# Patient Record
Sex: Female | Born: 1995 | Race: Black or African American | Hispanic: No | Marital: Married | Smoking: Never smoker
Health system: Southern US, Community
[De-identification: ages and names within clinical notes are randomized; demographics above are authoritative.]

## PROBLEM LIST (undated history)

## (undated) ENCOUNTER — Emergency Department (HOSPITAL_COMMUNITY): Admission: EM | Discharge status: Absent without leave | Payer: BLUE CROSS/BLUE SHIELD

## (undated) DIAGNOSIS — Z8489 Family history of other specified conditions: Secondary | ICD-10-CM

## (undated) DIAGNOSIS — R519 Headache, unspecified: Secondary | ICD-10-CM

## (undated) DIAGNOSIS — A491 Streptococcal infection, unspecified site: Secondary | ICD-10-CM

## (undated) DIAGNOSIS — L309 Dermatitis, unspecified: Secondary | ICD-10-CM

## (undated) DIAGNOSIS — I340 Nonrheumatic mitral (valve) insufficiency: Secondary | ICD-10-CM

## (undated) DIAGNOSIS — J309 Allergic rhinitis, unspecified: Secondary | ICD-10-CM

## (undated) DIAGNOSIS — Z9109 Other allergy status, other than to drugs and biological substances: Secondary | ICD-10-CM

## (undated) DIAGNOSIS — Z227 Latent tuberculosis: Secondary | ICD-10-CM

## (undated) DIAGNOSIS — R7611 Nonspecific reaction to tuberculin skin test without active tuberculosis: Secondary | ICD-10-CM

## (undated) DIAGNOSIS — T8859XA Other complications of anesthesia, initial encounter: Secondary | ICD-10-CM

## (undated) DIAGNOSIS — J111 Influenza due to unidentified influenza virus with other respiratory manifestations: Secondary | ICD-10-CM

## (undated) DIAGNOSIS — J45909 Unspecified asthma, uncomplicated: Secondary | ICD-10-CM

## (undated) DIAGNOSIS — K219 Gastro-esophageal reflux disease without esophagitis: Secondary | ICD-10-CM

## (undated) DIAGNOSIS — R51 Headache: Secondary | ICD-10-CM

## (undated) DIAGNOSIS — K760 Fatty (change of) liver, not elsewhere classified: Secondary | ICD-10-CM

## (undated) DIAGNOSIS — T7840XA Allergy, unspecified, initial encounter: Secondary | ICD-10-CM

## (undated) DIAGNOSIS — L739 Follicular disorder, unspecified: Secondary | ICD-10-CM

## (undated) DIAGNOSIS — E010 Iodine-deficiency related diffuse (endemic) goiter: Secondary | ICD-10-CM

## (undated) HISTORY — DX: Allergic rhinitis, unspecified: J30.9

## (undated) HISTORY — PX: WISDOM TOOTH EXTRACTION: SHX21

## (undated) HISTORY — DX: Streptococcal infection, unspecified site: A49.1

## (undated) HISTORY — DX: Dermatitis, unspecified: L30.9

## (undated) HISTORY — DX: Gastro-esophageal reflux disease without esophagitis: K21.9

## (undated) HISTORY — DX: Headache: R51

## (undated) HISTORY — DX: Unspecified asthma, uncomplicated: J45.909

## (undated) HISTORY — DX: Allergy, unspecified, initial encounter: T78.40XA

## (undated) HISTORY — DX: Influenza due to unidentified influenza virus with other respiratory manifestations: J11.1

## (undated) HISTORY — DX: Headache, unspecified: R51.9

## (undated) HISTORY — DX: Other allergy status, other than to drugs and biological substances: Z91.09

---
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
## 2015-06-08 ENCOUNTER — Emergency Department (HOSPITAL_COMMUNITY): Payer: Medicaid Other

## 2015-06-08 ENCOUNTER — Encounter (HOSPITAL_COMMUNITY): Payer: Self-pay | Admitting: *Deleted

## 2015-06-08 ENCOUNTER — Emergency Department (HOSPITAL_COMMUNITY)
Admission: EM | Admit: 2015-06-08 | Discharge: 2015-06-08 | Disposition: A | Discharge status: Home / Self Care | Payer: Medicaid Other | Attending: Emergency Medicine | Admitting: Emergency Medicine

## 2015-06-08 DIAGNOSIS — R109 Unspecified abdominal pain: Secondary | ICD-10-CM

## 2015-06-08 DIAGNOSIS — Z79899 Other long term (current) drug therapy: Secondary | ICD-10-CM | POA: Diagnosis not present

## 2015-06-08 DIAGNOSIS — R51 Headache: Secondary | ICD-10-CM | POA: Insufficient documentation

## 2015-06-08 DIAGNOSIS — R509 Fever, unspecified: Secondary | ICD-10-CM | POA: Diagnosis not present

## 2015-06-08 DIAGNOSIS — M546 Pain in thoracic spine: Secondary | ICD-10-CM | POA: Diagnosis not present

## 2015-06-08 DIAGNOSIS — J45901 Unspecified asthma with (acute) exacerbation: Secondary | ICD-10-CM | POA: Insufficient documentation

## 2015-06-08 DIAGNOSIS — Z8619 Personal history of other infectious and parasitic diseases: Secondary | ICD-10-CM | POA: Insufficient documentation

## 2015-06-08 DIAGNOSIS — Z872 Personal history of diseases of the skin and subcutaneous tissue: Secondary | ICD-10-CM | POA: Diagnosis not present

## 2015-06-08 DIAGNOSIS — Z3202 Encounter for pregnancy test, result negative: Secondary | ICD-10-CM | POA: Diagnosis not present

## 2015-06-08 LAB — COMPREHENSIVE METABOLIC PANEL
ALT: 16 U/L (ref 14–54)
AST: 26 U/L (ref 15–41)
Albumin: 4.9 g/dL (ref 3.5–5.0)
Alkaline Phosphatase: 48 U/L (ref 38–126)
Anion gap: 10 (ref 5–15)
BUN: 9 mg/dL (ref 6–20)
CO2: 27 mmol/L (ref 22–32)
Calcium: 10.2 mg/dL (ref 8.9–10.3)
Chloride: 108 mmol/L (ref 101–111)
Creatinine, Ser: 0.84 mg/dL (ref 0.44–1.00)
GFR calc Af Amer: >60 mL/min (ref >60)
GFR calc non Af Amer: >60 mL/min (ref >60)
Glucose, Bld: 92 mg/dL (ref 65–99)
Potassium: 4.3 mmol/L (ref 3.5–5.1)
Sodium: 145 mmol/L (ref 135–145)
Total Bilirubin: 0.9 mg/dL (ref 0.3–1.2)
Total Protein: 7.9 g/dL (ref 6.5–8.1)

## 2015-06-08 LAB — CBC WITH DIFFERENTIAL/PLATELET
Basophils Absolute: 0 10*3/uL (ref 0.0–0.1)
Basophils Relative: 0 %
Eosinophils Absolute: 0.1 10*3/uL (ref 0.0–0.7)
Eosinophils Relative: 1 %
HCT: 39.2 % (ref 36.0–46.0)
Hemoglobin: 12.7 g/dL (ref 12.0–15.0)
Lymphocytes Relative: 41 %
Lymphs Abs: 2.7 10*3/uL (ref 0.7–4.0)
MCH: 28.6 pg (ref 26.0–34.0)
MCHC: 32.4 g/dL (ref 30.0–36.0)
MCV: 88.3 fL (ref 78.0–100.0)
Monocytes Absolute: 0.3 10*3/uL (ref 0.1–1.0)
Monocytes Relative: 5 %
Neutro Abs: 3.5 10*3/uL (ref 1.7–7.7)
Neutrophils Relative %: 53 %
Platelets: 348 10*3/uL (ref 150–400)
RBC: 4.44 MIL/uL (ref 3.87–5.11)
RDW: 12.9 % (ref 11.5–15.5)
WBC: 6.6 10*3/uL (ref 4.0–10.5)

## 2015-06-08 LAB — URINALYSIS, ROUTINE W REFLEX MICROSCOPIC
Bilirubin Urine: NEGATIVE
Glucose, UA: NEGATIVE mg/dL
Hgb urine dipstick: NEGATIVE
Ketones, ur: NEGATIVE mg/dL
Leukocytes, UA: NEGATIVE
Nitrite: NEGATIVE
Protein, ur: NEGATIVE mg/dL
Specific Gravity, Urine: 1.015 (ref 1.005–1.030)
pH: 6.5 (ref 5.0–8.0)

## 2015-06-08 LAB — LIPASE, BLOOD: Lipase: 37 U/L (ref 11–51)

## 2015-06-08 LAB — POC URINE PREG, ED: Preg Test, Ur: NEGATIVE

## 2015-06-08 MED ORDER — SODIUM CHLORIDE 0.9 % IV BOLUS (SEPSIS)
1000.0000 mL | Freq: Once | INTRAVENOUS | Status: AC
  Administered 2015-06-08: 1000 mL via INTRAVENOUS

## 2015-06-08 MED ORDER — ACETAMINOPHEN 325 MG PO TABS
650.0000 mg | ORAL_TABLET | Freq: Once | ORAL | Status: AC
  Administered 2015-06-08: 650 mg via ORAL
  Filled 2015-06-08: qty 2

## 2015-06-08 MED ORDER — IBUPROFEN 800 MG PO TABS: 800.0000 mg | ORAL_TABLET | Freq: Three times a day (TID) | ORAL | Status: DC

## 2015-06-08 MED ORDER — KETOROLAC TROMETHAMINE 30 MG/ML IJ SOLN
30.0000 mg | Freq: Once | INTRAMUSCULAR | Status: AC
  Administered 2015-06-08: 30 mg via INTRAVENOUS
  Filled 2015-06-08: qty 1

## 2015-06-08 NOTE — ED Provider Notes (Signed)
CSN: [id number]     Arrival date & time 06/08/15  1627 History   First MD Initiated Contact with Patient 06/08/15 1836     Chief Complaint  Patient presents with  . Flank Pain     (Consider location/radiation/quality/duration/timing/severity/associated sxs/prior Treatment) Patient is a 19 y.o. female presenting with flank pain. The history is provided by the patient.  Flank Pain This is a new problem. The current episode started in the past 7 days (3 days ago). The problem occurs constantly. The problem has been unchanged. Associated symptoms include a fever and headaches. Pertinent negatives include no abdominal pain, chest pain, coughing, nausea, urinary symptoms or vomiting. The symptoms are aggravated by bending and twisting. She has tried NSAIDs, ice and heat for the symptoms. The treatment provided no relief.   Maria Smith is a 19 y.o. female with PMH significant for asthma, headaches who presents with gradual onset, constant, moderate right flank pain that radiates to her right abdomen that began 3 days ago that is unrelieved with OTC medications, heat, and ice.  She states it is made worse with movement, walking, and deep breathing.  No modifying factors.  She denies any urinary symptoms, N/V/D, CP, or abdominal pain.  She endorses SOB due to pain with inspiration.  She reports fever yesterday (102.3) that has resolved.  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   Past Medical History  Diagnosis Date  . Streptococcal infection(041.00)     Age 68 and 19 years old  . Influenza     Age 65 years  . Environmental allergies     Age 66 years  . Asthma   . Allergic rhinitis   . Eczema   . Generalized headaches     Began at age 30 years   Past Surgical History  Procedure Laterality Date  . Tonsillectomy  2012  . Wisdom tooth extraction     Family History  Problem Relation Age of Onset  . Migraines Paternal Aunt     . Migraines Paternal Grandmother   . Migraines Paternal Grandfather   . Migraines Maternal Uncle     Maternal Great Uncle  . Diabetes      Family History  . Hypertension      Family History  . Depression      Family History  . Asthma      Family History  . Allergic rhinitis      Family History   Social History  Substance Use Topics  . Smoking status: Passive Smoke Exposure - Never Smoker  . Smokeless tobacco: Never Used  . Alcohol Use: No   OB History    No data available     Review of Systems  Constitutional: Positive for fever.  Respiratory: Positive for shortness of breath. Negative for cough.   Cardiovascular: Negative for chest pain and leg swelling.  Gastrointestinal: Negative for nausea, vomiting, abdominal pain, constipation and blood in stool.  Genitourinary: Positive for flank pain. Negative for dysuria, urgency, frequency and hematuria.  Neurological: Positive for headaches.  All other systems reviewed and are negative.     Allergies  Review of patient's allergies indicates no known allergies.  Home Medications   Prior to Admission medications   Medication Sig Start Date End Date Taking? Authorizing Provider  ibuprofen (ADVIL,MOTRIN) 200 MG tablet Take 400 mg by mouth every 6 (six) hours as needed for moderate pain.   Yes Historical Provider, MD  albuterol (PROVENTIL HFA;VENTOLIN HFA) 108 (90 BASE) MCG/ACT inhaler Inhale 2 puffs into the lungs every 6 (six) hours as needed for wheezing.    Historical Provider, MD  ibuprofen (ADVIL,MOTRIN) 800 MG tablet Take 1 tablet (800 mg total) by mouth every 8 (eight) hours as needed for cramping. Patient not taking: Reported on 06/08/2015 10/07/14   Lawson Fiscal A Clemmons, CNM  traMADol (ULTRAM) 50 MG tablet Take 1 tablet (50 mg total) by mouth every 6 (six) hours as needed for moderate pain. Patient not taking: Reported on 06/08/2015 12/05/14   Fayrene Helper, PA-C   BP 111/73 mmHg  Pulse 67  Temp(Src) 98 F (36.7 C) (Oral)   Resp 17  Ht  (1.473 m)  Wt 51.256 kg  BMI 23.62 kg/m2  SpO2 96%  LMP 06/05/2015 Physical Exam  Constitutional: She is oriented to person, place, and time. She appears well-developed and well-nourished.  HENT:  Head: Normocephalic and atraumatic.  Mouth/Throat: Oropharynx is clear and moist.  Eyes: Conjunctivae are normal. Pupils are equal, round, and reactive to light.  Neck: Normal range of motion. Neck supple.  Cardiovascular: Normal rate, regular rhythm and normal heart sounds.   No murmur heard. Pulmonary/Chest: Effort normal and breath sounds normal. No accessory muscle usage or stridor. No respiratory distress. She has no wheezes. She has no rhonchi. She has no rales.  Abdominal: Soft. Bowel sounds are normal. She exhibits no distension. There is no tenderness. There is no rigidity, no rebound, no guarding and no CVA tenderness.  Musculoskeletal: Normal range of motion. She exhibits tenderness.       Thoracic back: She exhibits pain.       Back:  No cervical/thoracic/lumbar midline tenderness.  Lymphadenopathy:    She has no cervical adenopathy.  Neurological: She is alert and oriented to person, place, and time.  Speech clear without dysarthria.  Skin: Skin is warm and dry.  Psychiatric: She has a normal mood and affect. Her behavior is normal.    ED Course  Procedures (including critical care time) Labs Review Labs Reviewed  URINALYSIS, ROUTINE W REFLEX MICROSCOPIC (NOT AT [facility]) - Abnormal; Notable for the following:    APPearance CLOUDY (*)    All other components within normal limits  CBC WITH DIFFERENTIAL/PLATELET  COMPREHENSIVE METABOLIC PANEL  LIPASE, BLOOD  POC URINE PREG, ED    Imaging Review Dg Chest 2 View  06/08/2015  CLINICAL DATA:  Headache, flank and chest pain today. EXAM: CHEST  2 VIEW COMPARISON:  12/04/2014 FINDINGS: The heart size and mediastinal contours are within normal limits. Both lungs are clear. The visualized skeletal structures  are unremarkable. Stable left convex thoracic scoliosis. IMPRESSION: Normal chest x-ray. Electronically Signed   By: Rudie Meyer M.D.   On: 06/08/2015 19:39   Ct Renal Stone Study  06/08/2015  CLINICAL DATA:  Right flank pain and headache starting Monday EXAM: CT ABDOMEN AND PELVIS WITHOUT CONTRAST TECHNIQUE: Multidetector CT imaging of the abdomen and pelvis was performed following the standard protocol without IV contrast. COMPARISON:  01/27/2013 FINDINGS: Lung bases are unremarkable. Study is limited without IV and oral contrast. Unenhanced liver shows no biliary ductal dilatation. Sagittal images of the spine are unremarkable. Unenhanced pancreas, spleen and adrenal glands are unremarkable. No aortic aneurysm. Unenhanced kidneys are symmetrical in size. No nephrolithiasis. No hydronephrosis or hydroureter. No small bowel obstruction. No ascites or free air. No adenopathy. There is a low lying cecum. Moderate stool noted in right colon and cecum. No pericecal inflammation. The  appendix is not identified. The uterus is anteflexed. The unenhanced ovaries are poorly visualized. No adnexal mass. There is some stool in descending colon. Moderate stool and gas noted within rectum. No calcified calculi are noted within urinary bladder. No calcified ureteral calculi are noted bilaterally. No destructive bony lesions are noted within pelvis. There is no inguinal adenopathy. IMPRESSION: 1. No nephrolithiasis.  No hydronephrosis or hydroureter. 2. No calcified ureteral calculi. 3. There is a low lying cecum. Moderate stool noted in right colon and cecum. No pericecal inflammation. Appendix is not identified. 4. Normal size anteflexed uterus.  No adnexal mass. 5. Moderate stool and gas noted within rectum. No distal colonic obstruction. No small bowel obstruction. Electronically Signed   By: Natasha MeadLiviu  Pop M.D.   On: 06/08/2015 19:32   I have personally reviewed and evaluated these images and lab results as part of my  medical decision-making.   EKG Interpretation None      MDM   Final diagnoses:  Right flank pain    Patient presents with right flank pain that radiates to her right abdomen x 3 days.  One episode of resolved fever (102.3).  No N/V or urinary symptoms.  VSS, NAD.  On exam, heart RRR, lungs CTAB, abdomen soft and benign.  Right CVA tenderness.  No midline tenderness.  Concern for pyelonephritis vs nephrolithiasis vs PNA vs musculoskeletal.  Will obtain labs and CT renal stone study.  Will give fluids and tylenol.  CBC unremarkable CMP unremarkable.  Will give toradol. UA unremarkable CT Renal shows no nephrolithiasis.  Moderate stool in right colon.  No acute abnormalities.  CXR negative.  Suspect musculoskeletal etiology.  Patient's symptoms have improved.  Will d/c home with Motrin.  Evaluation does not show pathology requring ongoing emergent intervention or admission. Pt is hemodynamically stable and mentating appropriately. Discussed findings/results and plan with patient/guardian, who agrees with plan. All questions answered. Return precautions discussed and outpatient follow up given. Case has been discussed with and seen by Dr. Patria Maneampos who agrees with the above plan for discharge.   Cheri FowlerKayla Cecile Gillispie, PA-C 06/08/15 2044  Azalia BilisKevin Campos, MD 06/08/15 [phone]

## 2015-06-08 NOTE — ED Notes (Signed)
Pt complains of pain in her right flank and headache since Monday. Pt denies n/v/d, urinary frequency, dysuria. Pt states she took ibuprofen for pain, which did not help.

## 2015-06-08 NOTE — Discharge Instructions (Signed)
Flank Pain °Flank pain refers to pain that is located on the side of the body between the upper abdomen and the back. The pain may occur over a short period of time (acute) or may be long-term or reoccurring (chronic). It may be mild or severe. Flank pain can be caused by many things. °CAUSES  °Some of the more common causes of flank pain include: °· Muscle strains.   °· Muscle spasms.   °· A disease of your spine (vertebral disk disease).   °· A lung infection (pneumonia).   °· Fluid around your lungs (pulmonary edema).   °· A kidney infection.   °· Kidney stones.   °· A very painful skin rash caused by the chickenpox virus (shingles).   °· Gallbladder disease.   °HOME CARE INSTRUCTIONS  °Home care will depend on the cause of your pain. In general, °· Rest as directed by your caregiver. °· Drink enough fluids to keep your urine clear or pale yellow. °· Only take over-the-counter or prescription medicines as directed by your caregiver. Some medicines may help relieve the pain. °· Tell your caregiver about any changes in your pain. °· Follow up with your caregiver as directed. °SEEK IMMEDIATE MEDICAL CARE IF:  °· Your pain is not controlled with medicine.   °· You have new or worsening symptoms. °· Your pain increases.   °· You have abdominal pain.   °· You have shortness of breath.   °· You have persistent nausea or vomiting.   °· You have swelling in your abdomen.   °· You feel faint or pass out.   °· You have blood in your urine. °· You have a fever or persistent symptoms for more than 2-3 days. °· You have a fever and your symptoms suddenly get worse. °MAKE SURE YOU:  °· Understand these instructions. °· Will watch your condition. °· Will get help right away if you are not doing well or get worse. °  °This information is not intended to replace advice given to you by your health care provider. Make sure you discuss any questions you have with your health care provider. °  °Document Released: 07/26/2005 Document  Revised: 02/27/2012 Document Reviewed: 01/17/2012 °Elsevier Interactive Patient Education ©2016 Elsevier Inc. ° ° ° ° °Emergency Department Resource Guide °1) Find a Doctor and Pay Out of Pocket °Although you won't have to find out who is covered by your insurance plan, it is a good idea to ask around and get recommendations. You will then need to call the office and see if the doctor you have chosen will accept you as a new patient and what types of options they offer for patients who are self-pay. Some doctors offer discounts or will set up payment plans for their patients who do not have insurance, but you will need to ask so you aren't surprised when you get to your appointment. ° °2) Contact Your Local Health Department °Not all health departments have doctors that can see patients for sick visits, but many do, so it is worth a call to see if yours does. If you don't know where your local health department is, you can check in your phone book. The CDC also has a tool to help you locate your state's health department, and many state websites also have listings of all of their local health departments. ° °3) Find a Walk-in Clinic °If your illness is not likely to be very severe or complicated, you may want to try a walk in clinic. These are popping up all over the country in pharmacies, drugstores, and shopping   centers. They're usually staffed by nurse practitioners or physician assistants that have been trained to treat common illnesses and complaints. They're usually fairly quick and inexpensive. However, if you have serious medical issues or chronic medical problems, these are probably not your best option. ° °No Primary Care Doctor: °- Call [facility] at  832-8000 - they can help you locate a primary care doctor that  accepts your insurance, provides certain services, etc. °- Physician Referral Service- 1-800-533-3463 ° °Chronic Pain Problems: °Organization         Address  Phone   Notes  °[facility]  (336) 297-2271 Patients need to be referred by their primary care doctor.  ° °Medication Assistance: °Organization         Address  Phone   Notes  °[place] [facility] [address] °[place], [place] [address] (336) 641-8030 --Must be a resident of [place] °-- Must have NO insurance coverage whatsoever (no Medicaid/ Medicare, etc.) °-- The pt. MUST have a primary care doctor that directs their care regularly and follows them in the community °  °[facility]  (866) 331-1348   °[employer]  (888) 892-1162   ° °Agencies that provide inexpensive medical care: °Organization         Address  Phone   Notes  °[facility]  (336) 832-8035   °[facility]    (336) 832-7272   °[facility] [address] °[place], [place] [address] (336) 832-4777   °[facility] [address], °[place] (336) 271-4999   °[facility]    (336) 373-0678   °[facility]    (336) 272-1050   °[facility] ° [address], [place] Phone:  (336) 832-4444, Fax:  (336) 832-4440 Hours of Operation:  9 am - 6 pm, M-F.  Also accepts Medicaid/Medicare and self-pay.  °[facility] ° [address], [place] Phone: (336) 832-3150, Fax: (336) 832-3151. Hours of Operation:  8:30 am - 5:30 pm, M-F.  Also accepts Medicaid and self-pay.  °[facility] [place] [address], [place] Phone: (336) 878-6027   °[facility] [address], [place], [place] (336)723-1848, Ext. 123 Mondays & Thursdays: 7-9 AM.  First 15 patients are seen on a first come, first serve basis. °  ° °Medicaid-accepting [place] Providers: ° °Organization         Address  Phone   Notes  °[facility] [address], [place] (336) 641-2100 Also accepts self-pay patients.  °[facility] [address], [place] ° (336) 856-9996   °[facility] [address], [place] (336) 288-8857   °[facility] [address] [place] [address], [place] (336) 299-7000   °Veita Bland [address], [place]  ° (336) 373-1557 Only accepts [place] Access Medicaid patients after they have their name applied to their card.  ° °Self-Pay (no insurance) in [place]: ° °Organization         Address  Phone   Notes  °Sickle Cell Patients, [facility] [address], [place] (336) 832-1970   °[facility] [address],  (336) 832-4400   °[facility] ° [address], [place] (336) 992-4800   °  [facility]/Dr. Osei-Bonsu ° [address] [place] [address], [place] or [address], [place] (336) 841-8500 Phone number for both [place] and [place] locations is the same.  °[facility] [address], [place] (336) 299-0000   °[facility] [address] [place] [address], [place] or [address] (336) 852-7530 °(336) 878-2260   °[facility] [address], [place] (336) 350-1642, phone; (336) 294-5005, fax Sees patients 1st and 3rd Saturday of every month.  Must not qualify for public or private insurance (i.e. Medicaid, Medicare, [place] Health Choice, Veterans' Benefits) • Household income should be no more than 200% of the poverty level •The clinic cannot treat you if you are pregnant or think you are pregnant • Sexually transmitted diseases are not treated at the clinic.  ° ° °Dental Care: °Organization         Address  Phone  Notes  °[facility] [facility] [address], [place] (336) 641-6152 Accepts children up to age 21 who are enrolled in Medicaid or [place] Health Choice; pregnant women with a Medicaid card; and children who have applied for Medicaid  or [place] Health Choice, but were declined, whose parents can pay a reduced fee at time of service.  °[facility] [place]  [address], [place] (336) 641-7733 Accepts children up to age 21 who are enrolled in Medicaid or [place] Health Choice; pregnant women with a Medicaid card; and children who have applied for Medicaid or [place] Health Choice, but were declined, whose parents can pay a reduced fee at time of service.  °[facility] ° [address], [place] (336) 641-4533 Patients are seen by appointment only. Walk-ins are not accepted. [facility] will see patients 18 years of age and older. °Monday - Tuesday (8am-5pm) °Most Wednesdays (8:30-5pm) °$30 per visit, cash only  °[facility] ° [address], [place] (336) 641-4533 Patients are seen by appointment only. Walk-ins are not accepted. [facility] will see patients 18 years of age and older. °One Wednesday Evening (Monthly: Volunteer Based).  $30 per visit, cash only  °[facility]  (919) 537-3737 for adults; Children under age 4, call Graduate Pediatric Dentistry at (919) [phone]. Children aged 4-14, please call (919) [phone] to request a pediatric application. ° Dental services are provided in all areas of dental care including fillings, crowns and bridges, complete and partial dentures, implants, gum treatment, root canals, and extractions. Preventive care is also provided. Treatment is provided to both adults and children. °Patients are selected via a lottery and there is often a waiting list. °  °[facility] [address], ° ° (336) 763-8833 [email or web address] °  °[facility] [address], [place], [place] (336)723-1848, Ext. 123 Second and Fourth Thursday of each month, opens at 6:30 AM; Clinic ends at 9 AM.  Patients are seen on a first-come first-served basis, and a limited number are seen  during each clinic.  ° °[facility] ° [address], [place], [place] (336) 723-7904   Eligibility Requirements °You must have lived in [place], [place], or [place] counties for at least the last three months. °  You cannot be eligible for state or federal sponsored healthcare insurance, including Veterans Administration, Medicaid, or Medicare. °  You generally cannot be eligible for healthcare insurance   through your employer.  °  How to apply: °Eligibility screenings are held every Tuesday and Wednesday afternoon from 1:00 pm until 4:00 pm. You do not need an appointment for the interview!  °[facility] [address], [place], [place] 336-631-2330   °[facility]  336-342-8273   °[facility]  336-703-3100   °[facility]  336-570-6415   ° °Behavioral Health Resources in the Community: °Intensive Outpatient Programs °Organization         Address  Phone  Notes  °[facility] [address], [place], [place] 336-878-6098   °[facility] [address], [place], [place] 336-832-9800   °[facility] [address], [place], [place] ° 336-882-2125   °[facility] [address],  °[place], [place] 1-800-853-5163 or 336-641-4981   °Substance Abuse Resources °Organization         Address  Phone  Notes  °[facility]  336-882-2125   °[facility]  336-784-9470   °[facility]  336-285-9073   °[facility]  336-845-3988   °Residential & Outpatient Substance Abuse Program  1-800-659-3381   °Psychological Services °Organization         Address  Phone  Notes  °[facility]  336- 832-9600   °[facility]  336- 378-7881   °[facility] [address], [place] 1-800-853-5163 or 336-641-4981   ° °Mobile Crisis Teams °Organization         Address  Phone  Notes  °[facility],  Mobile Crisis Care Unit  1-877-626-1772   °Assertive °[facility] ° [address] [place], [place] 336-834-9664   °Sharon DeEsch [address] °[place] 336-554-5454   ° °Self-Help/Support Groups °Organization         Address  Phone             Notes  °[facility] - variety of support groups  336- 373-1402 Call for more information  °Narcotics Anonymous (NA), [facility] [address], °[place]  2 meetings at this location  ° °Residential Treatment Programs °Organization         Address  Phone  Notes  °[facility] [address],    °[place]  1-866-801-8205   °[facility] ° [address], [place], [place] 704-293-8524   °[facility] [address], [place] 336-845-3988 Admissions: 8am-3pm M-F  °[facility] [address]    °[place], [place] 336-841-1104   °[facility] [address], [place], [place] 336-379-7146   °[facility] [address]  °[place], [place] 336-285-9073   °[facility] - Intensive Outpatient [address], , [place] 336-852-3033   °[facility] [address]  °[place], [place] 1-877-615-2722 or 336-784-9470   °[facility] ([facility] [address] [place], [place] 336-227-7417 Accepts Medicaid  °[facility] [address]  ° [place] 1-800-659-3381 Substance Abuse/Addiction Treatment  ° °[place] Behavioral Health Resources °Organization         Address  Phone  Notes  °[facility]  (888) 581-9988   °Julie Brannon, PhD [address] [place],    (336) 349-5553 or (336) 951-0000   °[facility]   [address] °[place],  (336) 349-4454   °  [facility] [address], [place], [place] (336) 342-8316 Insurance/Medicaid/sponsorship through [facility]  °[facility] [address]                                    [place], [place] (336) 342-8316 Therapy/tele-psych/case  °[facility] [address]  ° [place], [place] (336) 349-2233    °Dr. Arfeen  (336) 349-4544   °[facility] [place]  [employer] [place] [facility] 1) [address], [place] °2) [address], [place] °3)  [address], [place] (336) 349-3220 °(336) 342-7768 ° °(336) 342-8140   °[place] [facility] (336) 342-1394 or (336) 342-3537 (After Hours)    ° ° ° °

## 2015-07-20 DIAGNOSIS — R7611 Nonspecific reaction to tuberculin skin test without active tuberculosis: Secondary | ICD-10-CM | POA: Diagnosis present

## 2015-07-20 HISTORY — DX: Nonspecific reaction to tuberculin skin test without active tuberculosis: R76.11

## 2015-08-10 ENCOUNTER — Other Ambulatory Visit: Payer: Self-pay | Admitting: Infectious Disease

## 2015-08-10 ENCOUNTER — Ambulatory Visit
Admission: RE | Admit: 2015-08-10 | Discharge: 2015-08-10 | Disposition: A | Discharge status: Home / Self Care | Payer: No Typology Code available for payment source | Source: Ambulatory Visit | Attending: Infectious Disease | Admitting: Infectious Disease

## 2015-08-10 DIAGNOSIS — Z139 Encounter for screening, unspecified: Secondary | ICD-10-CM

## 2015-10-22 ENCOUNTER — Encounter (HOSPITAL_COMMUNITY): Payer: Self-pay | Admitting: *Deleted

## 2015-10-22 ENCOUNTER — Inpatient Hospital Stay (HOSPITAL_COMMUNITY)
Admission: AD | Admit: 2015-10-22 | Discharge: 2015-10-22 | Disposition: A | Discharge status: Home / Self Care | Payer: Medicaid Other | Source: Ambulatory Visit | Attending: Obstetrics & Gynecology | Admitting: Obstetrics & Gynecology

## 2015-10-22 DIAGNOSIS — X58XXXA Exposure to other specified factors, initial encounter: Secondary | ICD-10-CM

## 2015-10-22 DIAGNOSIS — J45909 Unspecified asthma, uncomplicated: Secondary | ICD-10-CM | POA: Diagnosis not present

## 2015-10-22 DIAGNOSIS — S3991XA Unspecified injury of abdomen, initial encounter: Secondary | ICD-10-CM | POA: Diagnosis not present

## 2015-10-22 DIAGNOSIS — M7918 Myalgia, other site: Secondary | ICD-10-CM

## 2015-10-22 DIAGNOSIS — S3981XA Other specified injuries of abdomen, initial encounter: Secondary | ICD-10-CM | POA: Diagnosis not present

## 2015-10-22 DIAGNOSIS — M791 Myalgia: Secondary | ICD-10-CM | POA: Insufficient documentation

## 2015-10-22 DIAGNOSIS — Y9389 Activity, other specified: Secondary | ICD-10-CM | POA: Diagnosis not present

## 2015-10-22 DIAGNOSIS — Y990 Civilian activity done for income or pay: Secondary | ICD-10-CM | POA: Insufficient documentation

## 2015-10-22 DIAGNOSIS — R7611 Nonspecific reaction to tuberculin skin test without active tuberculosis: Secondary | ICD-10-CM | POA: Diagnosis present

## 2015-10-22 DIAGNOSIS — W2209XA Striking against other stationary object, initial encounter: Secondary | ICD-10-CM | POA: Diagnosis not present

## 2015-10-22 HISTORY — DX: Nonspecific reaction to tuberculin skin test without active tuberculosis: R76.11

## 2015-10-22 LAB — CBC
HCT: 35.8 % — ABNORMAL LOW (ref 36.0–46.0)
Hemoglobin: 11.6 g/dL — ABNORMAL LOW (ref 12.0–15.0)
MCH: 26.2 pg (ref 26.0–34.0)
MCHC: 32.4 g/dL (ref 30.0–36.0)
MCV: 80.8 fL (ref 78.0–100.0)
Platelets: 396 10*3/uL (ref 150–400)
RBC: 4.43 MIL/uL (ref 3.87–5.11)
RDW: 15.5 % (ref 11.5–15.5)
WBC: 5.2 10*3/uL (ref 4.0–10.5)

## 2015-10-22 LAB — WET PREP, GENITAL
Clue Cells Wet Prep HPF POC: NONE SEEN
Sperm: NONE SEEN
Trich, Wet Prep: NONE SEEN
Yeast Wet Prep HPF POC: NONE SEEN

## 2015-10-22 LAB — URINALYSIS, ROUTINE W REFLEX MICROSCOPIC
Bilirubin Urine: NEGATIVE
Glucose, UA: 100 mg/dL — AB
Hgb urine dipstick: NEGATIVE
Ketones, ur: 15 mg/dL — AB
Leukocytes, UA: NEGATIVE
Nitrite: NEGATIVE
Protein, ur: NEGATIVE mg/dL
Specific Gravity, Urine: 1.015 (ref 1.005–1.030)
pH: 5.5 (ref 5.0–8.0)

## 2015-10-22 LAB — POCT PREGNANCY, URINE: Preg Test, Ur: NEGATIVE

## 2015-10-22 MED ORDER — KETOROLAC TROMETHAMINE 10 MG PO TABS: 10.0000 mg | ORAL_TABLET | Freq: Four times a day (QID) | ORAL | Status: DC | PRN

## 2015-10-22 MED ORDER — KETOROLAC TROMETHAMINE 10 MG PO TABS
10.0000 mg | ORAL_TABLET | Freq: Once | ORAL | Status: AC
  Administered 2015-10-22: 10 mg via ORAL
  Filled 2015-10-22: qty 1

## 2015-10-22 NOTE — MAU Note (Signed)
Pt states she is having pain in her left side and pain around her vagina.  Pt states the pain started about a week ago.  Pt states she hit her side at work about a week ago and this is when the pain started.

## 2015-10-22 NOTE — MAU Provider Note (Signed)
Chief Complaint: Abdominal Pain  First Provider Initiated Contact with Patient 10/22/15 1510      SUBJECTIVE HPI: Maria Smith is a 20 y.o. G0 who presents to [facility] reporting left groin pain radiating down to her left labia 1 week since running into a metal bar at work. 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   Location: Left groin Quality:  Stabbing Severity: 9/10 on pain scale Duration: 1 week Context: after trauma Timing: constant Radiation: Down left labia Modifying factors: Minor improvement w/ Ibuprofen 400 mg and warm compresses. Associated signs and symptoms: Neg for fever, chills, N/V/D/C, urinary complaints, vaginal bleeding, vaginal discharge, difficulties w/ ambulation.   Past Medical History  Diagnosis Date  . Streptococcal infection(041.00)     Age 45 and 20 years old  . Influenza     Age 59 years  . Environmental allergies     Age 86 years  . Asthma   . Allergic rhinitis   . Eczema   . Generalized headaches     Began at age 48 years  . 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 Tonsillectomy  2012  . Wisdom tooth extraction     Social History   Social History  . Marital Status: Single    Spouse Name: N/A  . Number of Children: N/A  . Years of Education: N/A   Occupational History  . Not on file.   Social History Main Topics  . Smoking status: Passive Smoke Exposure - Never Smoker  . Smokeless tobacco: Never Used  . Alcohol Use: No  . Drug Use: No  . Sexual Activity: No   Other Topics Concern  . Not on file   Social History Narrative   No current facility-administered medications on file prior to encounter.   Current Outpatient Prescriptions on File Prior to Encounter  Medication Sig Dispense Refill  . ibuprofen (ADVIL,MOTRIN) 800 MG  tablet Take 1 tablet (800 mg total) by mouth 3 (three) times daily. (Patient not taking: Reported on 10/22/2015) 21 tablet 0  . traMADol (ULTRAM) 50 MG tablet Take 1 tablet (50 mg total) by mouth every 6 (six) hours as needed for moderate pain. (Patient not taking: Reported on 06/08/2015) 15 tablet 0   No Known Allergies  I have reviewed the past Medical Hx, Surgical Hx, Social Hx, Allergies and Medications.   Review of Systems  Constitutional: Negative for fever and chills.  Gastrointestinal: Positive for abdominal pain. Negative for nausea, vomiting, diarrhea and constipation.  Genitourinary: Negative for dysuria, urgency, frequency, hematuria, flank pain, vaginal bleeding, vaginal discharge, difficulty urinating, menstrual problem and pelvic pain.       Groin/vulvar pain  Musculoskeletal: Negative for back pain and gait problem.    OBJECTIVE Patient Vitals for the past 24 hrs:  BP Temp Temp src Pulse Resp  10/22/15 1445 111/62 mmHg 98.8 F (37.1 C) Oral 78 16   Constitutional: Well-developed, well-nourished female in no acute distress.  Cardiovascular: normal rate Respiratory: normal rate and effort.  GI: Abd soft, non-tender, gravid appropriate for gestational age. Pos BS x 4 MS: Mild TTP over left groin/hip, no edema, bruising. Normal ROM. Neurologic: Alert and oriented x 4.  GU: Neg CVAT.  SPECULUM EXAM: NEFG, physiologic discharge, no blood noted, cervix clean  BIMANUAL:  cervix closed; uterus normal size, no adnexal tenderness or masses. No CMT.  LAB RESULTS Results for orders placed or performed during the hospital encounter of 10/22/15 (from the past 24 hour(s))  Urinalysis, Routine w reflex microscopic (not at [facility])     Status: Abnormal   Collection Time: 10/22/15  2:35 PM  Result Value Ref Range   Color, Urine AMBER (A) YELLOW   APPearance CLEAR CLEAR   Specific Gravity, Urine 1.015 1.005 - 1.030   pH 5.5 5.0 - 8.0   Glucose, UA 100 (A) NEGATIVE mg/dL   Hgb urine  dipstick NEGATIVE NEGATIVE   Bilirubin Urine NEGATIVE NEGATIVE   Ketones, ur 15 (A) NEGATIVE mg/dL   Protein, ur NEGATIVE NEGATIVE mg/dL   Nitrite NEGATIVE NEGATIVE   Leukocytes, UA NEGATIVE NEGATIVE  Pregnancy, urine POC     Status: None   Collection Time: 10/22/15  3:02 PM  Result Value Ref Range   Preg Test, Ur NEGATIVE NEGATIVE  CBC     Status: Abnormal   Collection Time: 10/22/15  3:20 PM  Result Value Ref Range   WBC 5.2 4.0 - 10.5 K/uL   RBC 4.43 3.87 - 5.11 MIL/uL   Hemoglobin 11.6 (L) 12.0 - 15.0 g/dL   HCT 16.1 (L) 09.6 - 04.5 %   MCV 80.8 78.0 - 100.0 fL   MCH 26.2 26.0 - 34.0 pg   MCHC 32.4 30.0 - 36.0 g/dL   RDW 40.9 81.1 - 91.4 %   Platelets 396 150 - 400 K/uL  Wet prep, genital     Status: Abnormal   Collection Time: 10/22/15  3:56 PM  Result Value Ref Range   Yeast Wet Prep HPF POC NONE SEEN NONE SEEN   Trich, Wet Prep NONE SEEN NONE SEEN   Clue Cells Wet Prep HPF POC NONE SEEN NONE SEEN   WBC, Wet Prep HPF POC FEW (A) NONE SEEN   Sperm NONE SEEN     IMAGING No results found.  MAU COURSE UPT, UA, Wet prep, GC/Chlamydia, CBC.  Discussed w/ pt that this does not appear to be Gyn pain. Offered OP pelvic US if pain feels pelvic, but pt thinks it is more superficial and at the site of her injury. Declines [place]. Toradol given.  MDM - 20 year old female with diffuse skeletal left groin pain. No evidence of emergent condition. No evidence of gyn  etiology for pain.  ASSESSMENT 1. Injury of groin, initial encounter   2. Musculoskeletal pain     PLAN Discharge home in stable condition. RICE  May try heat and stretches this far after the injury Rx Toradol x 1 week  Follow-up Information    Follow up with [facility],R[facility].   Specialty:  Unknown Physician Specialty   Why:  As needed if symptoms worsen      Follow up with [facility].   Specialty:  Obstetrics and Gynecology   Why:  For non-emergent gynecology care or as needed for pelvic ultrasound    Contact information:   [address] [place] [address] [phone]        Medication List    STOP taking these medications        ibuprofen 800 MG tablet  Commonly known as:  ADVIL,MOTRIN     traMADol 50 MG tablet  Commonly known as:  ULTRAM      TAKE these medications        ketorolac 10 MG tablet  Commonly known as:  TORADOL  Take  1 tablet (10 mg total) by mouth every 6 (six) hours as needed for moderate pain.     rifampin 150 MG capsule  Commonly known as:  RIFADIN  Take 150 mg by mouth daily.       [place]ia Erichsen, [place] 10/22/2015  5:28 PM

## 2015-10-22 NOTE — Discharge Instructions (Signed)

## 2015-10-24 LAB — GC/CHLAMYDIA PROBE AMP (~~LOC~~) NOT AT ARMC
Chlamydia: NEGATIVE
Neisseria Gonorrhea: NEGATIVE

## 2016-04-30 ENCOUNTER — Ambulatory Visit: Payer: Medicaid Other | Admitting: Family

## 2016-05-03 ENCOUNTER — Telehealth: Payer: Self-pay | Admitting: *Deleted

## 2016-05-03 NOTE — Telephone Encounter (Signed)
Unable to reach patient at time of Pre-Visit Call.  Left message for patient to return call when available.    

## 2016-05-04 ENCOUNTER — Ambulatory Visit (INDEPENDENT_AMBULATORY_CARE_PROVIDER_SITE_OTHER): Payer: BLUE CROSS/BLUE SHIELD | Admitting: Family Medicine

## 2016-05-04 ENCOUNTER — Encounter: Payer: Self-pay | Admitting: Family Medicine

## 2016-05-04 VITALS — BP 104/62 | HR 68 | Temp 97.9°F | Ht <= 58 in | Wt 104.4 lb

## 2016-05-04 DIAGNOSIS — J4520 Mild intermittent asthma, uncomplicated: Secondary | ICD-10-CM

## 2016-05-04 DIAGNOSIS — G43809 Other migraine, not intractable, without status migrainosus: Secondary | ICD-10-CM

## 2016-05-04 DIAGNOSIS — Z23 Encounter for immunization: Secondary | ICD-10-CM | POA: Diagnosis not present

## 2016-05-04 MED ORDER — KETOROLAC TROMETHAMINE 30 MG/ML IJ SOLN
30.0000 mg | Freq: Once | INTRAMUSCULAR | Status: AC
  Administered 2016-05-04: 30 mg via INTRAMUSCULAR

## 2016-05-04 MED ORDER — ALBUTEROL SULFATE HFA 108 (90 BASE) MCG/ACT IN AERS: 2.0000 | INHALATION_SPRAY | Freq: Four times a day (QID) | RESPIRATORY_TRACT | 3 refills | Status: DC | PRN

## 2016-05-04 MED ORDER — SUMATRIPTAN SUCCINATE 50 MG PO TABS: 50.0000 mg | ORAL_TABLET | ORAL | 0 refills | Status: DC | PRN

## 2016-05-04 NOTE — Patient Instructions (Addendum)
Try to avoid triggers like mold. Keep track of how often you need to use your inhaler.  If you are having more headaches, make an appointment to see about a daily medication.

## 2016-05-04 NOTE — Progress Notes (Signed)
Pre visit review using our clinic review tool, if applicable. No additional management support is needed unless otherwise documented below in the visit note. 

## 2016-05-04 NOTE — Progress Notes (Signed)
Chief Complaint  Patient presents with  . Establish Care    Pt states having severe headaches consistantly in left side with throbbing and shooting and is not going away        New Patient Visit SUBJECTIVE: HPI: Maria Smith is an 20 y.o.female who is being seen for establishing care.  The patient was previously seen at a pediatrician's office several years ago.  2 week headache. +hx of migraines.One is left-sided, she is sensitive to light, sound, and has some nausea. Denies any vision changes, weakness, numbness, or tingling. She's tried going into a dark room, avoiding sounds, ibuprofen, Tylenol, and warm compresses without relief. She's not had a headache in over a year prior to this. She used to be on Elavil and Topamax, however due to side effects had to stop taking this. She was prescribed a triptan medication, however never really took it.  The patient has a history of asthma. Is normally well controlled with just an albuterol inhaler. Normally, she only uses it before exercise and rarely when she has shortness of breath. Her triggers include dust, cold, and mold. Visually mold was found in her apartment. She notices that she has been having more chest tightness and shortness of breath. She has been out of her albuterol inhaler as well.  No Known Allergies  Past Medical History:  Diagnosis Date  . Allergic rhinitis   . Asthma   . Eczema   . Environmental allergies    Age 41 years  . Generalized headaches    Began at age 20 years  . Influenza    Age 69 years  . 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 Streptococcal infection(041.00)    Age 20 and 20 years old   Past Surgical History:  Procedure Laterality Date  . TONSILLECTOMY  2012  . WISDOM TOOTH EXTRACTION     Social History   Social History  . Marital status: Single   Social History Main Topics  . Smoking status: Passive Smoke Exposure - Never Smoker  . Smokeless tobacco: Never Used  .  Alcohol use No  . Drug use: No  . Sexual activity: No   Family History  Problem Relation Age of Onset  . Migraines Paternal Aunt   . Migraines Paternal Grandmother   . Migraines Paternal Grandfather   . Migraines Maternal Uncle     Maternal Great Uncle  . Diabetes      Family History  . Hypertension      Family History  . Depression      Family History  . Asthma      Family History  . Allergic rhinitis      Family History     Current Outpatient Prescriptions:  .  albuterol (PROVENTIL HFA;VENTOLIN HFA) 108 (90 Base) MCG/ACT inhaler, Inhale 2 puffs into the lungs every 6 (six) hours as needed for wheezing or shortness of breath., Disp: 2 Inhaler, Rfl: 3 .  ketorolac (TORADOL) 10 MG tablet, Take 1 tablet (10 mg total) by mouth every 6 (six) hours as needed for moderate pain. (Patient not taking: Reported on 05/04/2016), Disp: 20 tablet, Rfl: 0 .  rifampin (RIFADIN) 150 MG capsule, Take 150 mg by mouth daily., Disp: , Rfl:  .  SUMAtriptan (IMITREX) 50 MG tablet, Take 1 tablet (50 mg total) by mouth every 2 (two) hours as needed for migraine. May repeat in 2 hours if headache persists or recurs., Disp: 10 tablet, Rfl: 0  Patient's  last menstrual period was 03/25/2016.  ROS Neuro: Denies numbness/tingling, +HA  Eyes: Denies vision changes   OBJECTIVE: BP 104/62 (BP Location: Left Arm, Patient Position: Sitting, Cuff Size: Small)   Pulse 68   Temp 97.9 F (36.6 C) (Oral)   Ht 4\' 9"  (1.448 m)   Wt 104 lb 6.4 oz (47.4 kg)   LMP 03/25/2016   SpO2 99%   BMI 22.59 kg/m   Constitutional: -  VS reviewed -  Well developed, well nourished, appears stated age -  No apparent distress  Psychiatric: -  Oriented to person, place, and time -  Memory intact -  Affect and mood normal -  Fluent conversation, good eye contact -  Judgment and insight age appropriate  Eye: -  Conjunctivae clear, no discharge -  Pupils symmetric, round, reactive to light  ENMT: -  Ears are patent b/l  without erythema or discharge. TM's are shiny and clear b/l without evidence of effusion or infection. -  Oral mucosa without lesions, tongue and uvula midline    Tonsils not enlarged, no erythema, no exudate, trachea midline    Pharynx moist, no lesions, no erythema  Neck: -  No gross swelling, no palpable masses -  Thyroid midline, not enlarged, mobile, no palpable masses  Cardiovascular: -  RRR, no murmurs -  No LE edema  Respiratory: -  Normal respiratory effort, no accessory muscle use, no retraction -  Breath sounds equal, no wheezes, no ronchi, no crackles  Gastrointestinal: -  Bowel sounds normal -  No tenderness, no distention, no guarding, no masses  Neurological:  -  CN II - XII grossly intact -  Patellar DTR 3/4 b/l, calcaneal DTR 2/4 b/l, biceps reflex 2/4 b/l, no clonus, no cerebellar signs -  Sensation grossly intact to light touch, equal bilaterally  Musculoskeletal: -  No clubbing, no cyanosis -  Gait normal  Skin: -  No significant lesion on inspection -  Warm and dry to palpation   ASSESSMENT/PLAN: Other migraine without status migrainosus, not intractable - Plan: SUMAtriptan (IMITREX) 50 MG tablet, ketorolac (TORADOL) 30 MG/ML injection 30 mg  Mild intermittent asthma without complication - Plan: albuterol (PROVENTIL HFA;VENTOLIN HFA) 108 (90 Base) MCG/ACT inhaler  Encounter for immunization - Plan: Flu Vaccine QUAD 36+ mos IM  Patient instructed to sign release of records form from her previous PCP. Toradol today. Imitrex in case it occurs again. If he starts having recurrent headaches, would like to return and we can discuss a daily prophylactic medication. Would likely start Mg. For asthma, will refill her albuterol inhaler. I will recheck her in around 1 month and see if she needs any maintenance inhalers. Both she and her roommate are disgruntled with their apartment managements handling of mold in their home. The requested a letter from me. I wrote one stating  that I believe it is best for her health to avoid environmental triggers such as mold. Letter for work also given. Patient should return in 1 mo to recheck asthma. Will need to schedule well visit when she turns 21. Can do pap smears with us or with GYN if she is more comfortable. The patient voiced understanding and agreement to the plan.   Jilda Rocheicholas Paul McIntyreWendling, DO 05/04/16  8:27 AM

## 2016-05-30 ENCOUNTER — Ambulatory Visit: Payer: Medicaid Other | Admitting: Family

## 2016-06-13 ENCOUNTER — Ambulatory Visit: Payer: BLUE CROSS/BLUE SHIELD | Admitting: Family Medicine

## 2016-06-20 ENCOUNTER — Encounter: Payer: Self-pay | Admitting: Family Medicine

## 2016-06-20 ENCOUNTER — Ambulatory Visit (INDEPENDENT_AMBULATORY_CARE_PROVIDER_SITE_OTHER): Payer: BLUE CROSS/BLUE SHIELD | Admitting: Family Medicine

## 2016-06-20 VITALS — BP 110/70 | HR 58 | Temp 98.1°F | Ht <= 58 in | Wt 108.6 lb

## 2016-06-20 DIAGNOSIS — Z0000 Encounter for general adult medical examination without abnormal findings: Secondary | ICD-10-CM

## 2016-06-20 DIAGNOSIS — R002 Palpitations: Secondary | ICD-10-CM | POA: Diagnosis not present

## 2016-06-20 LAB — BASIC METABOLIC PANEL
BUN: 8 mg/dL (ref 6–23)
CO2: 27 mEq/L (ref 19–32)
Calcium: 9.6 mg/dL (ref 8.4–10.5)
Chloride: 105 mEq/L (ref 96–112)
Creatinine, Ser: 0.77 mg/dL (ref 0.40–1.20)
GFR: 121.84 mL/min (ref >60.00)
Glucose, Bld: 88 mg/dL (ref 70–99)
Potassium: 3.6 mEq/L (ref 3.5–5.1)
Sodium: 138 mEq/L (ref 135–145)

## 2016-06-20 LAB — TSH: TSH: 1.03 u[IU]/mL (ref 0.35–5.50)

## 2016-06-20 LAB — POCT URINE PREGNANCY: Preg Test, Ur: NEGATIVE

## 2016-06-20 LAB — MAGNESIUM: Magnesium: 2.1 mg/dL (ref 1.5–2.5)

## 2016-06-20 NOTE — Patient Instructions (Addendum)
Establish with a GYN for your pap smear after you turn 21.  Someone will notify you by the end of the week about your lab results.  Use your Eucerin twice daily. Once before bed and once after getting out of shower (within 3 min of drying off).

## 2016-06-20 NOTE — Progress Notes (Signed)
Chief Complaint  Patient presents with  . Annual Exam    Well Woman Maria Smith is here for a complete physical.   Her last physical was >1 year ago.  Current diet: in general, a "unhealthy" diet  . Current exercise: no routine exercise. Weight is stable and she denies daytime fatigue. Patient's last menstrual period was 05/05/2016. She is sexually active with a female partner. Normally regular with her cycles.  Follow with a Dentist? Yes.   Follow with an Optometrist? Yes.   Seatbelt? Yes  Palpitations Up to 2-3 times per day, will have fluttering in her chest. Usually at night, when she wakes up, or when she lies down. Lasts around 1 minNot exertional. No pain, SOB, arm/jaw pain. She does have asthma and takes albuterol, but uses it on average of 2x/day. No significant personal hx or famhx of heart disease.  Past Medical History:  Diagnosis Date  . Allergic rhinitis   . Asthma   . Eczema   . Environmental allergies    Age 21 years  . Generalized headaches    Began at age 89 years  . Influenza    Age 58 years  . 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 Streptococcal infection(041.00)    Age 21 and 21 years old    Past Surgical History:  Procedure Laterality Date  . TONSILLECTOMY  2012  . WISDOM TOOTH EXTRACTION     Medications  Current Outpatient Prescriptions on File Prior to Visit  Medication Sig Dispense Refill  . albuterol (PROVENTIL HFA;VENTOLIN HFA) 108 (90 Base) MCG/ACT inhaler Inhale 2 puffs into the lungs every 6 (six) hours as needed for wheezing or shortness of breath. 2 Inhaler 3  . ketorolac (TORADOL) 10 MG tablet Take 1 tablet (10 mg total) by mouth every 6 (six) hours as needed for moderate pain. (Patient not taking: Reported on 06/20/2016) 20 tablet 0  . rifampin (RIFADIN) 150 MG capsule Take 150 mg by mouth daily.    . SUMAtriptan (IMITREX) 50 MG tablet Take 1 tablet (50 mg total) by mouth every 2 (two) hours as needed for migraine. May  repeat in 2 hours if headache persists or recurs. (Patient not taking: Reported on 06/20/2016) 10 tablet 0   Allergies No Known Allergies  Review of Systems: Constitutional:  no unexpected change in weight, no weakness, no unexplained fevers, sweats, or chills Eye:  no recent significant change in vision Ear/Nose/Mouth/Throat:  Ears:  no tinnitus or vertigo and no recent change in hearing, Nose/Mouth/Throat:  no complaints of nasal congestion or discharge, no sore throat and no recent change in voice or hoarseness Cardiovascular:  no exercise intolerance, no chest pain, +palpitations Respiratory:  no chronic cough, sputum, or hemoptysis and no shortness of breath Gastrointestinal:  no abdominal pain, no change in bowel habits, no significant change in appetite, no nausea, vomiting, diarrhea, or constipation and no black or bloody stool GU:  Female: negative for dysuria, frequency, and incontinence, Normal menses; no abnormal bleeding, pelvic pain, or discharge Musculoskeletal/Extremities:  no pain, redness, or swelling of the joints Integumentary (Skin/Breast):  +dry skin on back and sides, no new breast lumps or masses Neurologic:  no chronic headaches, no numbness, tingling, or tremor Psychiatric:  no anxiety, no depression Endocrine:  denies fatigue, weight changes, heat/cold intolerance, bowel or skin changes, or cardiovascular system symptoms Hematologic/Lymphatic:  no abnormal bleeding, no HIV risk factors, no night sweats, no swollen nodes, no weight loss Allergic/Immunologic:  no  history of food or environmental allergies  Exam BP 110/70 (BP Location: Left Arm, Patient Position: Sitting, Cuff Size: Small)   Pulse (!) 58   Temp 98.1 F (36.7 C) (Oral)   Ht 4\' 9"  (1.448 m)   Wt 108 lb 9.6 oz (49.3 kg)   LMP 05/05/2016   SpO2 95%   BMI 23.50 kg/m  General:  well developed, well nourished, in no apparent distress Skin:  +dry and slightly hyperpigmented areas on back with minimal  scaling, no erythema or drainage ;no significant moles, warts, or growths Head:  no masses, lesions, or tenderness Eyes:  pupils equal and round, sclera anicteric without injection Ears:  canals without lesions, TMs shiny without retraction, no obvious effusion, no erythema Nose:  nares patent, septum midline, mucosa normal, and no drainage or sinus tenderness Throat/Pharynx:  lips and gingiva without lesion; tongue and uvula midline; non-inflamed pharynx; no exudates or postnasal drainage Neck: neck supple without adenopathy, thyromegaly, or masses Breasts: Not examined Thorax:  nontender Lungs:  clear to auscultation, breath sounds equal bilaterally, no respiratory distress Cardio:  regular rate and rhythm without murmurs, heart sounds without clicks or rubs, point of maximal impulse normal; no lifts, heaves, or thrills Abdomen:  abdomen soft, nontender; bowel sounds normal; no masses or organomegaly Genital: Female: Not examined Musculoskeletal:  symmetrical muscle groups noted without atrophy or deformity Extremities:  no clubbing, cyanosis, or edema, no deformities, no skin discoloration Neuro:  gait normal; deep tendon reflexes normal and symmetric Psych: well oriented with normal range of affect and appropriate judgment/insight  Assessment and Plan  Well adult exam  Palpitations - Plan: EKG 12-Lead, TSH, Basic Metabolic Panel (BMET), Magnesium, Holter monitor - 72 hour   Well 21 y.o. female. Counseled on diet and exercise. EKG unremarkable. V5 was malfunctioning. Likely PVCs or PACs but will rule out other etiologies. Start using lotion twice daily, once before bed and once after getting out of shower. Will make sure she is not pregnant until she establishes with GYN. She will get pap smears there also. Follow up in 2 weeks pending the above workup. The patient voiced understanding and agreement to the plan.  Jilda Roche [place], DO 06/20/16 11:33 AM

## 2016-06-20 NOTE — Addendum Note (Signed)
Addended by: Eustace QuailEABOLD, Lyndall Bellot J on: 06/20/2016 11:47 AM   Modules accepted: Orders

## 2016-06-20 NOTE — Progress Notes (Signed)
Pre visit review using our clinic review tool, if applicable. No additional management support is needed unless otherwise documented below in the visit note. 

## 2016-06-21 ENCOUNTER — Telehealth: Payer: Self-pay | Admitting: Family Medicine

## 2016-06-21 NOTE — Telephone Encounter (Signed)
Patient returned your call. Please advise.  

## 2016-06-21 NOTE — Telephone Encounter (Signed)
LVMOM for pt to return my call into the office. TL/CMA 

## 2016-06-21 NOTE — Telephone Encounter (Signed)
Pt called in to speak back with nurse in regards to results. She would like to know specifically what labs were drawn?   Please advise.

## 2016-06-21 NOTE — Telephone Encounter (Signed)
Have her come back and we can discuss her options. If she would like to wait until she can be seen by her GYN, that is OK also. TY.

## 2016-06-21 NOTE — Telephone Encounter (Signed)
I have spoken to patient and informed of each lab that was ordered and gave results to help understand. Pt had questions about menstrual because she states she has not had one since November. Pt seems concerned and wants to know her next steps. TL/CMA

## 2016-06-21 NOTE — Addendum Note (Signed)
Addended by: Radene GunningWENDLING, Mirha Brucato P on: 06/21/2016 04:51 PM   Modules accepted: Orders

## 2016-06-22 NOTE — Telephone Encounter (Signed)
I have spoken to patient and she has informed me her menstraul started yesterday (06/22/2016) FYI only TL/CMA

## 2016-06-25 ENCOUNTER — Encounter: Payer: BLUE CROSS/BLUE SHIELD | Admitting: Obstetrics and Gynecology

## 2016-06-27 ENCOUNTER — Ambulatory Visit (INDEPENDENT_AMBULATORY_CARE_PROVIDER_SITE_OTHER): Payer: BLUE CROSS/BLUE SHIELD

## 2016-06-27 DIAGNOSIS — R002 Palpitations: Secondary | ICD-10-CM | POA: Diagnosis not present

## 2016-07-04 ENCOUNTER — Ambulatory Visit: Payer: BLUE CROSS/BLUE SHIELD | Admitting: Family Medicine

## 2016-07-18 ENCOUNTER — Telehealth: Payer: Self-pay

## 2016-07-18 NOTE — Telephone Encounter (Signed)
Pt called into the office requesting results of heart monitor. TL/CMA

## 2016-07-19 NOTE — Addendum Note (Signed)
Addended by: Radene GunningWENDLING, NICHOLAS P on: 07/19/2016 12:41 PM   Modules accepted: Orders

## 2016-07-19 NOTE — Telephone Encounter (Signed)
It showed some intermittent PAC's and PVC's which are not significant, but likely the cause of her symptoms. Given her lack of heart history, no further testing warranted. Try to stay hydrated and let us know if things worsen.

## 2016-07-24 NOTE — Telephone Encounter (Signed)
Patient calling back checking on the status of message below, please advise  ° ° ° ° ° ° °

## 2016-07-25 NOTE — Telephone Encounter (Signed)
Pt notified of instructions and verbalized understanding. States she is having continued palpitations that are unchanged from previous office visit. She wonders if they might be related to anxiety, although states sometimes she is not anxious at all when symptoms occur. She states she does not drink much water-encouraged increased fluid intake. She stated she will call back to schedule a follow-up appt w/ PCP to discuss possible anxiety/other causes of her symptoms, as she does find the palpitations bothersome. Discussed red flags that should prompt emergent assessment and patient agreed to plan.

## 2017-01-28 ENCOUNTER — Ambulatory Visit (INDEPENDENT_AMBULATORY_CARE_PROVIDER_SITE_OTHER): Payer: BLUE CROSS/BLUE SHIELD | Admitting: Family Medicine

## 2017-01-28 ENCOUNTER — Encounter: Payer: Self-pay | Admitting: Family Medicine

## 2017-01-28 VITALS — BP 90/60 | HR 65 | Temp 98.3°F | Ht <= 58 in | Wt 104.2 lb

## 2017-01-28 DIAGNOSIS — K219 Gastro-esophageal reflux disease without esophagitis: Secondary | ICD-10-CM

## 2017-01-28 DIAGNOSIS — L0292 Furuncle, unspecified: Secondary | ICD-10-CM

## 2017-01-28 DIAGNOSIS — J4520 Mild intermittent asthma, uncomplicated: Secondary | ICD-10-CM | POA: Diagnosis not present

## 2017-01-28 MED ORDER — FLUTICASONE PROPIONATE HFA 110 MCG/ACT IN AERO: 2.0000 | INHALATION_SPRAY | Freq: Two times a day (BID) | RESPIRATORY_TRACT | 2 refills | Status: DC

## 2017-01-28 NOTE — Patient Instructions (Addendum)
Call [facility] at [phone] for an appointment.  They are located at [address], [address], [place], [place], [phone] (right across the hall from our office).   Use triple antibiotic ointment on areas that are painful on your chest.  The only lifestyle changes that have data behind them are weight loss for the overweight/obese and elevating the head of the bed. Finding out which foods/positions are triggers is important. Prilosec (omeprazole) or Pepcid (famotidine) may be helpful as well.  Rinse mouth out after use of the new inhaler.

## 2017-01-28 NOTE — Progress Notes (Signed)
Chief Complaint  Patient presents with  . Rash    on the chest-x 2 mos,also feeling nauseated in the morings  . Chest gets tight    comes and goes    Maria Smith is a 21 y.o. female here for a skin complaint.  Duration: 2 months Location: chest Pruritic? No Painful? No Drainage? No New soaps/lotions/topicals/detergents? No Sick contacts? No Other associated symptoms: None Therapies tried thus far: Witch Hazel  Asthma Hx of mild intermittent asthma, things have gotten worse over the past 2 weeks (Ragweed?). She has been using her rescue inhaler more frequently. She is not on any other medications. She has been coughing more frequently, some SOB. No wheezing, weight changes, fevers.  She also has some nausea and discomfort in mornings following alcohol consumption. She notices it with certain foods as well. No bleeding, pain or fevers.  ROS:  Const: No fevers Skin: As noted in HPI  Past Medical History:  Diagnosis Date  . Allergic rhinitis   . Asthma   . Eczema   . Environmental allergies    Age 24 years  . Generalized headaches    Began at age 21 years  . Influenza    Age 71 years  . 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 Streptococcal infection(041.00)    Age 21 and 21 years old   No Known Allergies Allergies as of 01/28/2017   No Known Allergies     Medication List       Accurate as of 01/28/17  9:55 AM. Always use your most recent med list.          albuterol 108 (90 Base) MCG/ACT inhaler Commonly known as:  PROVENTIL HFA;VENTOLIN HFA Inhale 2 puffs into the lungs every 6 (six) hours as needed for wheezing or shortness of breath.   fluticasone 110 MCG/ACT inhaler Commonly known as:  FLOVENT HFA Inhale 2 puffs into the lungs 2 (two) times daily.   OVER THE COUNTER MEDICATION Mulitivitamin gummie-Take 1 gummie by mouth daily.       BP 90/60 (BP Location: Left Arm, Patient Position: Sitting, Cuff Size: Normal)   Pulse 65    Temp 98.3 F (36.8 C) (Oral)   Ht 4' 9.75" (1.467 m)   Wt 104 lb 3.2 oz (47.3 kg)   LMP 01/04/2017 (Approximate)   SpO2 99%   BMI 21.97 kg/m  Gen: awake, alert, appearing stated age HEENT: Ears nml b/l, nares patent, no D/C, MMM Heart: RRR, no LE edema Lungs: CTAB, No accessory muscle use Skin: a few pustules over upper chest. No drainage, erythema, TTP, fluctuance, excoriation Psych: Age appropriate judgment and insight, nml mood and affect  Mild intermittent asthma without complication - Plan: fluticasone (FLOVENT HFA) 110 MCG/ACT inhaler  Furuncle  Gastroesophageal reflux disease, esophagitis presence not specified  Orders as above. Add ICS during ragweed season. Rinse mouth out after use. TAO on active lesions, OK to use witch hazel if she feels it is helpful. F/u sooner for drainage or fevers/seek care.  Lifestyle modifications discussed regarding reflux. Recommended PPI/H2 blocker if she does not have success with avoidance of triggers/elevating HOB. F/u in 6 weeks. The patient voiced understanding and agreement to the plan.  Jilda Rocheicholas Paul [place], DO 01/28/17 9:55 AM

## 2017-02-27 ENCOUNTER — Encounter: Payer: BLUE CROSS/BLUE SHIELD | Admitting: Obstetrics & Gynecology

## 2017-06-26 ENCOUNTER — Other Ambulatory Visit (HOSPITAL_COMMUNITY)
Admission: RE | Admit: 2017-06-26 | Discharge: 2017-06-26 | Disposition: A | Discharge status: Home / Self Care | Payer: BLUE CROSS/BLUE SHIELD | Source: Ambulatory Visit | Attending: Obstetrics and Gynecology | Admitting: Obstetrics and Gynecology

## 2017-06-26 ENCOUNTER — Other Ambulatory Visit: Payer: Self-pay

## 2017-06-26 ENCOUNTER — Ambulatory Visit (INDEPENDENT_AMBULATORY_CARE_PROVIDER_SITE_OTHER): Payer: BLUE CROSS/BLUE SHIELD | Admitting: Obstetrics and Gynecology

## 2017-06-26 ENCOUNTER — Encounter: Payer: Self-pay | Admitting: Obstetrics and Gynecology

## 2017-06-26 VITALS — BP 102/60 | HR 80 | Resp 14 | Ht <= 58 in | Wt 111.0 lb

## 2017-06-26 DIAGNOSIS — Z3169 Encounter for other general counseling and advice on procreation: Secondary | ICD-10-CM | POA: Diagnosis not present

## 2017-06-26 DIAGNOSIS — Z124 Encounter for screening for malignant neoplasm of cervix: Secondary | ICD-10-CM | POA: Insufficient documentation

## 2017-06-26 DIAGNOSIS — Z01419 Encounter for gynecological examination (general) (routine) without abnormal findings: Secondary | ICD-10-CM

## 2017-06-26 DIAGNOSIS — Z0000 Encounter for general adult medical examination without abnormal findings: Secondary | ICD-10-CM | POA: Diagnosis not present

## 2017-06-26 DIAGNOSIS — Z113 Encounter for screening for infections with a predominantly sexual mode of transmission: Secondary | ICD-10-CM | POA: Insufficient documentation

## 2017-06-26 NOTE — Patient Instructions (Signed)
EXERCISE AND DIET:  We recommended that you start or continue a regular exercise program for good health. Regular exercise means any activity that makes your heart beat faster and makes you sweat.  We recommend exercising at least 30 minutes per day at least 3 days a week, preferably 4 or 5.  We also recommend a diet low in fat and sugar.  Inactivity, poor dietary choices and obesity can cause diabetes, heart attack, stroke, and kidney damage, among others.    ALCOHOL AND SMOKING:  Women should limit their alcohol intake to no more than 7 drinks/beers/glasses of wine (combined, not each!) per week. Moderation of alcohol intake to this level decreases your risk of breast cancer and liver damage. And of course, no recreational drugs are part of a healthy lifestyle.  And absolutely no smoking or even second hand smoke. Most people know smoking can cause heart and lung diseases, but did you know it also contributes to weakening of your bones? Aging of your skin?  Yellowing of your teeth and nails?  CALCIUM AND VITAMIN D:  Adequate intake of calcium and Vitamin D are recommended.  The recommendations for exact amounts of these supplements seem to change often, but generally speaking 600 mg of calcium (either carbonate or citrate) and 800 units of Vitamin D per day seems prudent. Certain women may benefit from higher intake of Vitamin D.  If you are among these women, your doctor will have told you during your visit.    PAP SMEARS:  Pap smears, to check for cervical cancer or precancers,  have traditionally been done yearly, although recent scientific advances have shown that most women can have pap smears less often.  However, every woman still should have a physical exam from her gynecologist every year. It will include a breast check, inspection of the vulva and vagina to check for abnormal growths or skin changes, a visual exam of the cervix, and then an exam to evaluate the size and shape of the uterus and  ovaries.  And after 22 years of age, a rectal exam is indicated to check for rectal cancers. We will also provide age appropriate advice regarding health maintenance, like when you should have certain vaccines, screening for sexually transmitted diseases, bone density testing, colonoscopy, mammograms, etc.    Preparing for Pregnancy If you are considering becoming pregnant, make an appointment to see your regular health care provider to learn how to prepare for a safe and healthy pregnancy (preconception care). During a preconception care visit, your health care provider will:  Do a complete physical exam, including a Pap test.  Take a complete medical history.  Give you information, answer your questions, and help you resolve problems.  Preconception checklist Medical history  Tell your health care provider about any current or past medical conditions. Your pregnancy or your ability to become pregnant may be affected by chronic conditions, such as diabetes, chronic hypertension, and thyroid problems.  Include your family's medical history as well as your partner's medical history.  Tell your health care provider about any history of STIs (sexually transmitted infections).These can affect your pregnancy. In some cases, they can be passed to your baby. Discuss any concerns that you have about STIs.  If indicated, discuss the benefits of genetic testing. This testing will show whether there are any genetic conditions that may be passed from you or your partner to your baby.  Tell your health care provider about: ? Any problems you have had with conception  or pregnancy. ? Any medicines you take. These include vitamins, herbal supplements, and over-the-counter medicines. ? Your history of immunizations. Discuss any vaccinations that you may need.  Diet  Ask your health care provider what to include in a healthy diet that has a balance of nutrients. This is especially important when you are  pregnant or preparing to become pregnant.  Ask your health care provider to help you reach a healthy weight before pregnancy. ? If you are overweight, you may be at higher risk for certain complications, such as high blood pressure, diabetes, and preterm birth. ? If you are underweight, you are more likely to have a baby who has a low birth weight.  Lifestyle, work, and home  Let your health care provider know: ? About any lifestyle habits that you have, such as alcohol use, drug use, or smoking. ? About recreational activities that may put you at risk during pregnancy, such as downhill skiing and certain exercise programs. ? Tell your health care provider about any international travel, especially any travel to places with an active [place] virus outbreak. ? About harmful substances that you may be exposed to at work or at home. These include chemicals, pesticides, radiation, or even litter boxes. ? If you do not feel safe at home.  Mental health  Tell your health care provider about: ? Any history of mental health conditions, including feelings of depression, sadness, or anxiety. ? Any medicines that you take for a mental health condition. These include herbs and supplements.  Home instructions to prepare for pregnancy Lifestyle  Eat a balanced diet. This includes fresh fruits and vegetables, whole grains, lean meats, low-fat dairy products, healthy fats, and foods that are high in fiber. Ask to meet with a nutritionist or registered dietitian for assistance with meal planning and goals.  Get regular exercise. Try to be active for at least 30 minutes a day on most days of the week. Ask your health care provider which activities are safe during pregnancy.  Do not use any products that contain nicotine or tobacco, such as cigarettes and e-cigarettes. If you need help quitting, ask your health care provider.  Do not drink alcohol.  Do not take illegal drugs.  Maintain a healthy weight.  Ask your health care provider what weight range is right for you.  General instructions  Keep an accurate record of your menstrual periods. This makes it easier for your health care provider to determine your baby's due date.  Begin taking prenatal vitamins and folic acid supplements daily as directed by your health care provider.  Manage any chronic conditions, such as high blood pressure and diabetes, as told by your health care provider. This is important.  How do I know that I am pregnant? You may be pregnant if you have been sexually active and you miss your period. Symptoms of early pregnancy include:  Mild cramping.  Very light vaginal bleeding (spotting).  Feeling unusually tired.  Nausea and vomiting (morning sickness).  If you have any of these symptoms and you suspect that you might be pregnant, you can take a home pregnancy test. These tests check for a hormone in your urine (human chorionic gonadotropin, or hCG). A woman's body begins to make this hormone during early pregnancy. These tests are very accurate. Wait until at least the first day after you miss your period to take one. If the test shows that you are pregnant (you get a positive result), call your health care provider to  make an appointment for prenatal care. What should I do if I become pregnant?  Make an appointment with your health care provider as soon as you suspect you are pregnant.  Do not use any products that contain nicotine, such as cigarettes, chewing tobacco, and e-cigarettes. If you need help quitting, ask your health care provider.  Do not drink alcoholic beverages. Alcohol is related to a number of birth defects.  Avoid toxic odors and chemicals.  You may continue to have sexual intercourse if it does not cause pain or other problems, such as vaginal bleeding. This information is not intended to replace advice given to you by your health care provider. Make sure you discuss any questions you  have with your health care provider. Document Released: 05/17/2008 Document Revised: 01/31/2016 Document Reviewed: 12/25/2015 Elsevier Interactive Patient Education  2018 [employer]   

## 2017-06-26 NOTE — Progress Notes (Signed)
22 y.o. G0P0000 SingleAfrican AmericanF here for annual exam.  Sexually active, engaged to her female partner. Wants to get pregnant, requests referral to Infertility clinic.  Period Cycle (Days): 28 Period Duration (Days): 4-5 days  Period Pattern: Regular Menstrual Flow: Heavy Menstrual Control: Maxi pad Menstrual Control Change Freq (Hours): changes pad 5 times a day  Dysmenorrhea: (!) Mild Dysmenorrhea Symptoms: Cramping  Patient's last menstrual period was 05/23/2017.          Sexually active: Yes.    The current method of family planning is none (female partner)    Exercising: No.  The patient does not participate in regular exercise at present. Smoker:  no  Health Maintenance: Pap:  Never TDaP:  Up to date  Gardasil: completed all 3    reports that she is a non-smoker but has been exposed to tobacco smoke. she has never used smokeless tobacco. She reports that she drinks about 0.6 - 1.2 oz of alcohol per week. 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   Past Medical History:  Diagnosis Date  . Allergic rhinitis   . Asthma   . Eczema   . Environmental allergies    Age 46 years  . Generalized headaches    Began at age 22 years  . Influenza    Age 51 years  . 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 Streptococcal infection(041.00)    Age 22 and 22 years old    Past Surgical History:  Procedure Laterality Date  . TONSILLECTOMY  2012  . WISDOM TOOTH EXTRACTION      Current Outpatient Medications  Medication Sig Dispense Refill  . albuterol (PROVENTIL HFA;VENTOLIN HFA) 108 (90 Base) MCG/ACT inhaler Inhale 2 puffs into the lungs every 6 (six) hours as needed for wheezing or shortness of breath. 2 Inhaler 3  . Cyanocobalamin (VITAMIN B 12 PO) Take by mouth.    . Iron Combinations (IRON COMPLEX PO) Take by mouth.     No current facility-administered medications for this visit.     Family History  Problem  Relation Age of Onset  . Migraines Paternal Grandmother   . Migraines Paternal Grandfather   . Diabetes Mother   . Hypertension Mother   . Heart disease Mother   . Anemia Mother   . Anemia Sister   . Migraines Paternal Aunt   . Migraines Maternal Uncle        Maternal Great Uncle  . Diabetes Unknown        Family History  . Hypertension Unknown        Family History  . Depression Unknown        Family History  . Asthma Unknown        Family History  . Allergic rhinitis Unknown        Family History    Review of Systems  Constitutional: Negative.   HENT: Negative.   Eyes: Negative.   Respiratory: Negative.   Cardiovascular: Negative.   Gastrointestinal: Negative.   Endocrine: Negative.   Genitourinary: Negative.   Musculoskeletal: Negative.   Skin: Negative.   Allergic/Immunologic: Negative.   Neurological: Negative.   Psychiatric/Behavioral: Negative.   She has intermittent constipation. Has a BM 2-3 x a weeks.   Exam:   BP 102/60 (BP Location: Right Arm, Patient Position: Sitting, Cuff Size: Normal)   Pulse 80   Resp 14   Ht 4' 9.75" (1.467 m)  Wt 111 lb (50.3 kg)   LMP 05/23/2017   BMI 23.40 kg/m   Weight change: @WEIGHTCHANGE @ Height:   Height: 4' 9.75" (146.7 cm)  Ht Readings from Last 3 Encounters:  06/26/17 4' 9.75" (1.467 m)  01/28/17 4' 9.75" (1.467 m)  06/20/16 4\' 9"  (1.448 m)    General appearance: alert, cooperative and appears stated age Head: Normocephalic, without obvious abnormality, atraumatic Neck: no adenopathy, supple, symmetrical, trachea midline and thyroid normal to inspection and palpation Lungs: clear to auscultation bilaterally Cardiovascular: regular rate and rhythm Breasts: normal appearance, no masses or tenderness Abdomen: soft, non-tender; non distended,  no masses,  no organomegaly Extremities: extremities normal, atraumatic, no cyanosis or edema Skin: Skin color, texture, turgor normal. No rashes or lesions Lymph nodes:  Cervical, supraclavicular, and axillary nodes normal. No abnormal inguinal nodes palpated Neurologic: Grossly normal   Pelvic: External genitalia:  no lesions              Urethra:  normal appearing urethra with no masses, tenderness or lesions              Bartholins and Skenes: normal                 Vagina: normal appearing vagina with normal color and discharge, no lesions              Cervix: no lesions               Bimanual Exam:  Uterus:  normal size, contour, position, consistency, mobility, non-tender              Adnexa: no mass, fullness, tenderness               Rectovaginal: Confirms               Anus:  normal sphincter tone, no lesions  Chaperone was present for exam.  A:  Well Woman with normal exam  Wants to get pregnant, has a female partner  P:   Pap with GC/CT  Discussed breast self exam  Discussed calcium and vit D intake  STD blood work recently normal  Will refer to Infertility clinic.   Screening labs, Rubella antibody  Start PNV

## 2017-06-27 LAB — COMPREHENSIVE METABOLIC PANEL
ALT: 13 IU/L (ref 0–32)
AST: 24 IU/L (ref 0–40)
Albumin/Globulin Ratio: 1.7 (ref 1.2–2.2)
Albumin: 4.6 g/dL (ref 3.5–5.5)
Alkaline Phosphatase: 46 IU/L (ref 39–117)
BUN/Creatinine Ratio: 12 (ref 9–23)
BUN: 10 mg/dL (ref 6–20)
Bilirubin Total: 0.6 mg/dL (ref 0.0–1.2)
CO2: 24 mmol/L (ref 20–29)
Calcium: 9.9 mg/dL (ref 8.7–10.2)
Chloride: 102 mmol/L (ref 96–106)
Creatinine, Ser: 0.85 mg/dL (ref 0.57–1.00)
GFR calc Af Amer: 113 mL/min/{1.73_m2} (ref >59)
GFR calc non Af Amer: 98 mL/min/{1.73_m2} (ref >59)
Globulin, Total: 2.7 g/dL (ref 1.5–4.5)
Glucose: 84 mg/dL (ref 65–99)
Potassium: 4.5 mmol/L (ref 3.5–5.2)
Sodium: 140 mmol/L (ref 134–144)
Total Protein: 7.3 g/dL (ref 6.0–8.5)

## 2017-06-27 LAB — CYTOLOGY - PAP
Chlamydia: NEGATIVE
Diagnosis: NEGATIVE
Neisseria Gonorrhea: NEGATIVE

## 2017-06-27 LAB — CBC
Hematocrit: 42 % (ref 34.0–46.6)
Hemoglobin: 13.9 g/dL (ref 11.1–15.9)
MCH: 28.4 pg (ref 26.6–33.0)
MCHC: 33.1 g/dL (ref 31.5–35.7)
MCV: 86 fL (ref 79–97)
Platelets: 317 10*3/uL (ref 150–379)
RBC: 4.9 x10E6/uL (ref 3.77–5.28)
RDW: 14.9 % (ref 12.3–15.4)
WBC: 4.9 10*3/uL (ref 3.4–10.8)

## 2017-06-27 LAB — LIPID PANEL
Chol/HDL Ratio: 1.8 ratio (ref 0.0–4.4)
Cholesterol, Total: 167 mg/dL (ref 100–199)
HDL: 94 mg/dL (ref >39)
LDL Calculated: 67 mg/dL (ref 0–99)
Triglycerides: 32 mg/dL (ref 0–149)
VLDL Cholesterol Cal: 6 mg/dL (ref 5–40)

## 2017-06-27 LAB — RUBELLA SCREEN: Rubella Antibodies, IGG: 20.5 index (ref >0.99)

## 2017-08-16 ENCOUNTER — Encounter: Payer: Self-pay | Admitting: Family Medicine

## 2017-08-16 ENCOUNTER — Ambulatory Visit: Payer: BLUE CROSS/BLUE SHIELD | Admitting: Family Medicine

## 2017-08-16 ENCOUNTER — Ambulatory Visit: Payer: Self-pay | Admitting: *Deleted

## 2017-08-16 VITALS — BP 104/72 | HR 64 | Temp 98.2°F | Ht <= 58 in | Wt 118.4 lb

## 2017-08-16 DIAGNOSIS — R002 Palpitations: Secondary | ICD-10-CM

## 2017-08-16 DIAGNOSIS — J4520 Mild intermittent asthma, uncomplicated: Secondary | ICD-10-CM

## 2017-08-16 DIAGNOSIS — M542 Cervicalgia: Secondary | ICD-10-CM | POA: Diagnosis not present

## 2017-08-16 DIAGNOSIS — G4700 Insomnia, unspecified: Secondary | ICD-10-CM

## 2017-08-16 MED ORDER — ALBUTEROL SULFATE HFA 108 (90 BASE) MCG/ACT IN AERS: 2.0000 | INHALATION_SPRAY | Freq: Four times a day (QID) | RESPIRATORY_TRACT | 3 refills | Status: DC | PRN

## 2017-08-16 MED ORDER — METOPROLOL SUCCINATE ER 25 MG PO TB24: 25.0000 mg | ORAL_TABLET | Freq: Every day | ORAL | 3 refills | Status: DC

## 2017-08-16 NOTE — Telephone Encounter (Signed)
FYI

## 2017-08-16 NOTE — Telephone Encounter (Signed)
Called in c/o a fluttering sensation in her chest this morning and chest pain that occurred last night.    She has been having this problem for greater than 4 weeks.   She also c/o having a pain in the back of her head for 4 weeks.  I asked her what was different about last night that made her call in today.    "I could not sleep and the chest pain and now the fluttering in my chest this morning".   She has worn a heart monitor for 2 days.  (no date given).  She was told she had a little irregular heart beat but that nothing was wrong.   The doctor said she may have been dehydrated.     She took some melatonin last night twice to see if it would help her sleep.   "I've had trouble falling asleep for years"   "I have trouble with anxiety and I'm wondering if it's my anxiety".     "I just want to be checked"     I made her an appt with Dr. Carmelia RollerWendling for today at 1:45.     I instructed her to call back or call 911 if her symptoms get worse.   She verbalized understanding. Reason for Disposition . Palpitations are a chronic symptom (recurrent or ongoing AND present > 4 weeks)  Answer Assessment - Initial Assessment Questions 1. DESCRIPTION: "Please describe your heart rate or heart beat that you are having" (e.g., fast/slow, regular/irregular, skipped or extra beats, "palpitations")     Feels a fluttering in your chest this morning.   Last night I had chest pain on left side and into the middle of my chest.   No chest pain this morning.   I've had a pain in the back of my head for 4 wks.   I have a history of migraines and also I have a history of neck pain.    I've not had a migraine in a very long time. 2. ONSET: "When did it start?" (Minutes, hours or days)      Head pain 4 wks.    Chest pain last night and chest fluttering this morning. 3. DURATION: "How long does it last" (e.g., seconds, minutes, hours)     The fluttering is not constant.   I feel it mostly at night when I'm trying to sleep.  I  took a Melatonin last night X2.   It didn't change.   I could not sleep.   The chest pain is a chronic problem .   It just happens randomly.   I'm used to having pains in my chest.    4. PATTERN "Does it come and go, or has it been constant since it started?"  "Does it get worse with exertion?"   "Are you feeling it now?"     It comes and goes.   Last night I could not sleep and it was just different. 5. TAP: "Using your hand, can you tap out what you are feeling on a chair or table in front of you, so that I can hear?" (Note: not all patients can do this)       I'm an EMT.   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 6. HEART RATE: "Can you tell me your heart rate?" "How many beats in 15 seconds?"  (Note: not all patients can do this)       53 pulse.   I usually in mid 60's and 70's.  It feels irregular. 7. RECURRENT SYMPTOM: "Have you ever had this before?" If so, ask: "When was the last time?" and "What happened that time?"      Yes 8. CAUSE: "What do you think is causing the palpitations?"     I 'm not sure.   I thought it might be anxiety.   I've had trouble falling asleep for years.   I'm taking a prenatal vitamin.  Trying to get pregnant.  9. CARDIAC HISTORY: "Do you have any history of heart disease?" (e.g., heart attack, angina, bypass surgery, angioplasty, arrhythmia)      I wore a monitor for 2 days.  Irregular but "nothing wrong per the doctor" 10. OTHER SYMPTOMS: "Do you have any other symptoms?" (e.g., dizziness, chest pain, sweating, difficulty breathing)       None of the above.   My left arm was a little weak last night.    My left arm is fine this morning. 11. PREGNANCY: "Is there any chance you are pregnant?" "When was your last menstrual period?"       I'm trying to get pregnant but I'm not yet.  Protocols used: HEART RATE AND HEARTBEAT QUESTIONS-A-AH

## 2017-08-16 NOTE — Patient Instructions (Signed)
Congratulations on your marriage and future child!  Heat (pad or rice pillow in microwave) over affected area, 10-15 minutes twice daily.   OK to take Tylenol 1000 mg (2 extra strength tabs) or 975 mg (3 regular strength tabs) every 6 hours as needed.  Try to avoid NSAIDs if you are trying to get pregnant.   EXERCISES RANGE OF MOTION (ROM) AND STRETCHING EXERCISES  These exercises may help you when beginning to rehabilitate your issue. In order to successfully resolve your symptoms, you must improve your posture. These exercises are designed to help reduce the forward-head and rounded-shoulder posture which contributes to this condition. Your symptoms may resolve with or without further involvement from your physician, physical therapist or athletic trainer. While completing these exercises, remember:   Restoring tissue flexibility helps normal motion to return to the joints. This allows healthier, less painful movement and activity.  An effective stretch should be held for at least 20 seconds, although you may need to begin with shorter hold times for comfort.  A stretch should never be painful. You should only feel a gentle lengthening or release in the stretched tissue.  Do not do any stretch or exercise that you cannot tolerate.  STRETCH- Axial Extensors  Lie on your back on the floor. You may bend your knees for comfort. Place a rolled-up hand towel or dish towel, about 2 inches in diameter, under the part of your head that makes contact with the floor.  Gently tuck your chin, as if trying to make a "double chin," until you feel a gentle stretch at the base of your head.  Hold 15-20 seconds. Repeat 2-3 times. Complete this exercise 1 time per day.   STRETCH - Axial Extension   Stand or sit on a firm surface. Assume a good posture: chest up, shoulders drawn back, abdominal muscles slightly tense, knees unlocked (if standing) and feet hip width apart.  Slowly retract your chin so  your head slides back and your chin slightly lowers. Continue to look straight ahead.  You should feel a gentle stretch in the back of your head. Be certain not to feel an aggressive stretch since this can cause headaches later.  Hold for 15-20 seconds. Repeat 2-3 times. Complete this exercise 1 time per day.  STRETCH - Cervical Side Bend   Stand or sit on a firm surface. Assume a good posture: chest up, shoulders drawn back, abdominal muscles slightly tense, knees unlocked (if standing) and feet hip width apart.  Without letting your nose or shoulders move, slowly tip your right / left ear to your shoulder until your feel a gentle stretch in the muscles on the opposite side of your neck.  Hold 15-20 seconds. Repeat 2-3 times. Complete this exercise 1-2 times per day.  STRETCH - Cervical Rotators   Stand or sit on a firm surface. Assume a good posture: chest up, shoulders drawn back, abdominal muscles slightly tense, knees unlocked (if standing) and feet hip width apart.  Keeping your eyes level with the ground, slowly turn your head until you feel a gentle stretch along the back and opposite side of your neck.  Hold 15-20 seconds. Repeat 2-3 times. Complete this exercise 1-2 times per day.  RANGE OF MOTION - Neck Circles   Stand or sit on a firm surface. Assume a good posture: chest up, shoulders drawn back, abdominal muscles slightly tense, knees unlocked (if standing) and feet hip width apart.  Gently roll your head down and around from  the back of one shoulder to the back of the other. The motion should never be forced or painful.  Repeat the motion 10-20 times, or until you feel the neck muscles relax and loosen. Repeat 2-3 times. Complete the exercise 1-2 times per day. STRENGTHENING EXERCISES - Cervical Strain and Sprain These exercises may help you when beginning to rehabilitate your injury. They may resolve your symptoms with or without further involvement from your  physician, physical therapist, or athletic trainer. While completing these exercises, remember:   Muscles can gain both the endurance and the strength needed for everyday activities through controlled exercises.  Complete these exercises as instructed by your physician, physical therapist, or athletic trainer. Progress the resistance and repetitions only as guided.  You may experience muscle soreness or fatigue, but the pain or discomfort you are trying to eliminate should never worsen during these exercises. If this pain does worsen, stop and make certain you are following the directions exactly. If the pain is still present after adjustments, discontinue the exercise until you can discuss the trouble with your clinician.  STRENGTH - Cervical Flexors, Isometric  Face a wall, standing about 6 inches away. Place a small pillow, a ball about 6-8 inches in diameter, or a folded towel between your forehead and the wall.  Slightly tuck your chin and gently push your forehead into the soft object. Push only with mild to moderate intensity, building up tension gradually. Keep your jaw and forehead relaxed.  Hold 10 to 20 seconds. Keep your breathing relaxed.  Release the tension slowly. Relax your neck muscles completely before you start the next repetition. Repeat 2-3 times. Complete this exercise 1 time per day.  STRENGTH- Cervical Lateral Flexors, Isometric   Stand about 6 inches away from a wall. Place a small pillow, a ball about 6-8 inches in diameter, or a folded towel between the side of your head and the wall.  Slightly tuck your chin and gently tilt your head into the soft object. Push only with mild to moderate intensity, building up tension gradually. Keep your jaw and forehead relaxed.  Hold 10 to 20 seconds. Keep your breathing relaxed.  Release the tension slowly. Relax your neck muscles completely before you start the next repetition. Repeat 2-3 times. Complete this exercise 1  time per day.  STRENGTH - Cervical Extensors, Isometric   Stand about 6 inches away from a wall. Place a small pillow, a ball about 6-8 inches in diameter, or a folded towel between the back of your head and the wall.  Slightly tuck your chin and gently tilt your head back into the soft object. Push only with mild to moderate intensity, building up tension gradually. Keep your jaw and forehead relaxed.  Hold 10 to 20 seconds. Keep your breathing relaxed.  Release the tension slowly. Relax your neck muscles completely before you start the next repetition. Repeat 2-3 times. Complete this exercise 1 time per day.  POSTURE AND BODY MECHANICS CONSIDERATIONS Keeping correct posture when sitting, standing or completing your activities will reduce the stress put on different body tissues, allowing injured tissues a chance to heal and limiting painful experiences. The following are general guidelines for improved posture. Your physician or physical therapist will provide you with any instructions specific to your needs. While reading these guidelines, remember:  The exercises prescribed by your provider will help you have the flexibility and strength to maintain correct postures.  The correct posture provides the optimal environment for your joints to  work. All of your joints have less wear and tear when properly supported by a spine with good posture. This means you will experience a healthier, less painful body.  Correct posture must be practiced with all of your activities, especially prolonged sitting and standing. Correct posture is as important when doing repetitive low-stress activities (typing) as it is when doing a single heavy-load activity (lifting).  PROLONGED STANDING WHILE SLIGHTLY LEANING FORWARD When completing a task that requires you to lean forward while standing in one place for a long time, place either foot up on a stationary 2- to 4-inch high object to help maintain the best  posture. When both feet are on the ground, the low back tends to lose its slight inward curve. If this curve flattens (or becomes too large), then the back and your other joints will experience too much stress, fatigue more quickly, and can cause pain.   RESTING POSITIONS Consider which positions are most painful for you when choosing a resting position. If you have pain with flexion-based activities (sitting, bending, stooping, squatting), choose a position that allows you to rest in a less flexed posture. You would want to avoid curling into a fetal position on your side. If your pain worsens with extension-based activities (prolonged standing, working overhead), avoid resting in an extended position such as sleeping on your stomach. Most people will find more comfort when they rest with their spine in a more neutral position, neither too rounded nor too arched. Lying on a non-sagging bed on your side with a pillow between your knees, or on your back with a pillow under your knees will often provide some relief. Keep in mind, being in any one position for a prolonged period of time, no matter how correct your posture, can still lead to stiffness.  WALKING Walk with an upright posture. Your ears, shoulders, and hips should all line up. OFFICE WORK When working at a desk, create an environment that supports good, upright posture. Without extra support, muscles fatigue and lead to excessive strain on joints and other tissues.  CHAIR:  A chair should be able to slide under your desk when your back makes contact with the back of the chair. This allows you to work closely.  The chair's height should allow your eyes to be level with the upper part of your monitor and your hands to be slightly lower than your elbows.  Body position: ? Your feet should make contact with the floor. If this is not possible, use a foot rest. ? Keep your ears over your shoulders. This will reduce stress on your neck and low  back.

## 2017-08-16 NOTE — Progress Notes (Signed)
Musculoskeletal Exam  Patient: Maria Smith DOB: November 30, 1995  DOS: 08/16/2017  SUBJECTIVE:  Chief Complaint:   Chief Complaint  Patient presents with  . Chest Pain    neck and head pain    Maria Smith is a 22 y.o.  female for evaluation and treatment of chest, head and neck pain.   Onset:  2 weeks ago. No injury or change in activity.  Location: Neck b/l, R upper chest pain Character:  sharp  Progression of issue:  is unchanged Associated symptoms: palpitations (has had this issue in past, labs and Holter showed PVC's only and she had elected to not go on medication) Treatment: to date has been none.   Neurovascular symptoms: no Pt has been having anxiety after experiencing palpitations at night. Hx of anxiety, but never bothersome enough to see a counselor or start a med. This issue has been worse since 2 weeks ago. She has tried melatonin without improvement .  ROS: Musculoskeletal/Extremities: +neck pain Lungs: No SOB  Past Medical History:  Diagnosis Date  . Allergic rhinitis   . Asthma   . Eczema   . Environmental allergies    Age 59 years  . Generalized headaches    Began at age 57 years  . Influenza    Age 13 years  . 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 Streptococcal infection(041.00)    Age 17 and 22 years old   Past Surgical History:  Procedure Laterality Date  . TONSILLECTOMY  2012  . WISDOM TOOTH EXTRACTION     Allergies as of 08/16/2017   No Known Allergies     Medication List        Accurate as of 08/16/17  2:47 PM. Always use your most recent med list.          albuterol 108 (90 Base) MCG/ACT inhaler Commonly known as:  PROVENTIL HFA;VENTOLIN HFA Inhale 2 puffs into the lungs every 6 (six) hours as needed for wheezing or shortness of breath.   IRON COMPLEX PO Take by mouth.   metoprolol succinate 25 MG 24 hr tablet Commonly known as:  TOPROL-XL Take 1 tablet (25 mg total) by mouth daily.   VITAMIN B 12  PO Take by mouth.      No Known Allergies  Objective: VITAL SIGNS: BP 104/72 (BP Location: Left Arm, Patient Position: Sitting, Cuff Size: Normal)   Pulse 64   Temp 98.2 F (36.8 C) (Oral)   Ht 4\' 9"  (1.448 m)   Wt 118 lb 6 oz (53.7 kg)   SpO2 99%   BMI 25.62 kg/m  Constitutional: Well formed, well developed. No acute distress. Cardiovascular: RRR, no bruits or LE edema Thorax & Lungs: No accessory muscle use. CTAB Musculoskeletal: neck.   Normal active range of motion: yes.   Normal passive range of motion: yes Tenderness to palpation: yes, over subocc triangle b/l and b/l cerv paraspinal msc Deformity: no Ecchymosis: no There was also TTP over upper L thorax Neurologic: Normal sensory function. No focal deficits noted. DTR's equal and symmetry in LE's. No clonus. Psychiatric: Normal mood. Age appropriate judgment and insight. Alert & oriented x 3.    Assessment:  Palpitations - Plan: metoprolol succinate (TOPROL-XL) 25 MG 24 hr tablet  Neck pain  Insomnia, unspecified type  Mild intermittent asthma without complication - Plan: albuterol (PROVENTIL HFA;VENTOLIN HFA) 108 (90 Base) MCG/ACT inhaler  Plan: Orders as above. Tx palpitations with Toprol, hopefully this will help with her  anxiety/sleep issues.  Tylenol, heat, stretches/exercises for neck. F/u 6 weeks to recheck med.. The patient voiced understanding and agreement to the plan.   Jilda Rocheicholas Paul FerrisWendling, DO 08/16/17  2:47 PM

## 2017-08-16 NOTE — Progress Notes (Signed)
Pre visit review using our clinic review tool, if applicable. No additional management support is needed unless otherwise documented below in the visit note. 

## 2017-08-19 ENCOUNTER — Other Ambulatory Visit: Payer: Self-pay

## 2017-08-19 ENCOUNTER — Telehealth: Payer: Self-pay | Admitting: Family Medicine

## 2017-08-19 DIAGNOSIS — R002 Palpitations: Secondary | ICD-10-CM

## 2017-08-19 MED ORDER — ATENOLOL 50 MG PO TABS: 50.0000 mg | ORAL_TABLET | Freq: Every day | ORAL | 3 refills | Status: DC

## 2017-08-19 NOTE — Telephone Encounter (Signed)
Copied from CRM [phone]. Topic: Referral - Request >> Aug 19, 2017 10:07 AM Clack, Shanda BumpsJessica D wrote: Reason for CRM: Pt would like a referral to get her heart looked at, for a possible pulled muscle.

## 2017-08-19 NOTE — Telephone Encounter (Signed)
Referral placed. TY.  

## 2017-08-22 NOTE — Progress Notes (Signed)
Cardiology Office Note   Date:  08/23/2017   ID:  Maria Smith, DOB 18-Oct-1995, MRN [id number]  PCP:  Sharlene Dory, DO  Cardiologist:   No primary care provider on file. Referring:  Sharlene Dory, DO  Chief Complaint  Patient presents with  . Chest Pain      History of Present Illness: Maria Smith is a 22 y.o. female who is referred by Sharlene Dory, DO for evaluation of palpitations.  The patient reports that she has been having palpitations for the last couple of months.  This has been almost daily.  It is the same frequency but getting more intense.  She will have these more at night.  She has not had any presyncope or syncope.  I do notice that in January 2018 she wore a monitor and was found to have some PVCs and PACs of her infrequent and there was no other management suggested.  She describes them as skipping beats.  She is not describing rapid sustained rates.  She cannot bring them on and they happen sporadically.  She does not drink caffeine.  She is active in her job and it does not get worse with that.  She is also had chest discomfort.  This has been a left upper chest discomfort and mid chest discomfort.  It comes and goes with rest.  It may last for 30 minutes to an hour.  She sometimes has to use a heating pad.  It is sharp or dull.  She feels some tingling in her hands.  Again she cannot bring this on with activity and it goes away with rest.  She is had no prior cardiac history.    Past Medical History:  Diagnosis Date  . Allergic rhinitis   . Asthma   . Eczema   . Environmental allergies    Age 30 years  . Generalized headaches    Began at age 44 years  . Influenza    Age 84 years  . 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 Streptococcal infection(041.00)    Age 60 and 22 years old    Past Surgical History:  Procedure Laterality Date  . TONSILLECTOMY  2012  . WISDOM TOOTH EXTRACTION        Current Outpatient Medications  Medication Sig Dispense Refill  . albuterol (PROVENTIL HFA;VENTOLIN HFA) 108 (90 Base) MCG/ACT inhaler Inhale 2 puffs into the lungs every 6 (six) hours as needed for wheezing or shortness of breath. 2 Inhaler 3  . Cyanocobalamin (VITAMIN B 12 PO) Take by mouth.    . Iron Combinations (IRON COMPLEX PO) Take by mouth.     No current facility-administered medications for this visit.     Allergies:   Patient has no known allergies.    Social History:  The patient  reports that she is a non-smoker but has been exposed to tobacco smoke. she has never used smokeless tobacco. She reports that she drinks about 0.6 - 1.2 oz of alcohol per week. She reports that she does not use drugs.   Family History:  The patient's family history includes Allergic rhinitis in her unknown relative; Anemia in her mother and sister; Asthma in her unknown relative; Depression in her unknown relative; Diabetes in her mother and unknown relative; HIV in her father; Heart disease in her mother; Hypertension in her mother and unknown relative; Migraines in her maternal uncle, paternal aunt, paternal grandfather, and paternal grandmother.  ROS:  Please see the history of present illness.   Otherwise, review of systems are positive for none.   All other systems are reviewed and negative.    PHYSICAL EXAM: VS:  BP 110/66   Pulse 63   Ht 4\' 9"  (1.448 m)   Wt 114 lb 12.8 oz (52.1 kg)   BMI 24.84 kg/m  , BMI Body mass index is 24.84 kg/m. GENERAL:  Well appearing HEENT:  Pupils equal round and reactive, fundi not visualized, oral mucosa unremarkable NECK:  No jugular venous distention, waveform within normal limits, carotid upstroke brisk and symmetric, no bruits, no thyromegaly LYMPHATICS:  No cervical, inguinal adenopathy LUNGS:  Clear to auscultation bilaterally BACK:  No CVA tenderness CHEST:  Unremarkable HEART:  PMI not displaced or sustained,S1 and S2 within normal  limits, no S3, no S4, no clicks, no rubs, no murmurs ABD:  Flat, positive bowel sounds normal in frequency in pitch, no bruits, no rebound, no guarding, no midline pulsatile mass, no hepatomegaly, no splenomegaly EXT:  2 plus pulses throughout, no edema, no cyanosis no clubbing SKIN:  No rashes no nodules NEURO:  Cranial nerves II through XII grossly intact, motor grossly intact throughout PSYCH:  Cognitively intact, oriented to person place and time    EKG:  EKG is ordered today. The ekg ordered today demonstrates sinus rhythm, rate 63, axis within normal limits, intervals within normal limits, no acute ST-T wave changes.   Recent Labs: 06/26/2017: ALT 13; BUN 10; Creatinine, Ser 0.85; Hemoglobin 13.9; Platelets 317; Potassium 4.5; Sodium 140    Lipid Panel    Component Value Date/Time   CHOL 167 06/26/2017 1155   TRIG 32 06/26/2017 1155   HDL 94 06/26/2017 1155   CHOLHDL 1.8 06/26/2017 1155   LDLCALC 67 06/26/2017 1155      Wt Readings from Last 3 Encounters:  08/23/17 114 lb 12.8 oz (52.1 kg)  08/16/17 118 lb 6 oz (53.7 kg)  06/26/17 111 lb (50.3 kg)      Other studies Reviewed: Additional studies/ records that were reviewed today include: Holter and EKG 2018. Review of the above records demonstrates:  Please see elsewhere in the note.     ASSESSMENT AND PLAN:  PALPITATIONS: She has PACs and PVCs documented.  I am going to check an echo to make sure she has a structurally normal heart.  Her labs to include a TSH a few months ago were unremarkable.  If her echo is normal she would prefer no further management.  She will let me know in the future if she wants to try something like propranolol.  She was given metoprolol in the past but she did not like taking this because it dropped her blood pressure slightly.  CHEST PAIN:  This is very atypical.  No change in therapy is indicated.   I think is very unlikely to have a cardiac etiology and I have asked her to discuss this  with her primary provider.   Current medicines are reviewed at length with the patient today.  The patient does not have concerns regarding medicines.  The following changes have been made:  no change  Labs/ tests ordered today include:   Orders Placed This Encounter  Procedures  . EKG 12-Lead  . ECHOCARDIOGRAM COMPLETE     Disposition:   FU with me as needed.      Signed, Rollene RotundaJames Giulian Goldring, MD  08/23/2017 10:21 AM    [facility]

## 2017-08-23 ENCOUNTER — Ambulatory Visit (INDEPENDENT_AMBULATORY_CARE_PROVIDER_SITE_OTHER): Payer: BLUE CROSS/BLUE SHIELD | Admitting: Cardiology

## 2017-08-23 ENCOUNTER — Encounter: Payer: Self-pay | Admitting: Cardiology

## 2017-08-23 VITALS — BP 110/66 | HR 63 | Ht <= 58 in | Wt 114.8 lb

## 2017-08-23 DIAGNOSIS — R072 Precordial pain: Secondary | ICD-10-CM | POA: Diagnosis not present

## 2017-08-23 DIAGNOSIS — R002 Palpitations: Secondary | ICD-10-CM

## 2017-08-23 NOTE — Patient Instructions (Signed)
Medication Instructions:  Continue current medications  If you need a refill on your cardiac medications before your next appointment, please call your pharmacy.  Labwork: None Ordered   Testing/Procedures: Your physician has requested that you have an echocardiogram. Echocardiography is a painless test that uses sound waves to create images of your heart. It provides your doctor with information about the size and shape of your heart and how well your heart's chambers and valves are working. This procedure takes approximately one hour. There are no restrictions for this procedure.   Follow-Up: Your physician wants you to follow-up in: As Needed.     Thank you for choosing [facility]!!       

## 2017-08-26 ENCOUNTER — Ambulatory Visit: Payer: BLUE CROSS/BLUE SHIELD | Admitting: Cardiology

## 2017-08-28 ENCOUNTER — Ambulatory Visit: Payer: BLUE CROSS/BLUE SHIELD | Admitting: Cardiology

## 2017-08-29 ENCOUNTER — Ambulatory Visit (HOSPITAL_COMMUNITY): Payer: BLUE CROSS/BLUE SHIELD | Attending: Cardiovascular Disease

## 2017-08-29 ENCOUNTER — Other Ambulatory Visit: Payer: Self-pay

## 2017-08-29 DIAGNOSIS — R002 Palpitations: Secondary | ICD-10-CM

## 2017-11-18 DIAGNOSIS — N912 Amenorrhea, unspecified: Secondary | ICD-10-CM | POA: Diagnosis not present

## 2017-11-20 DIAGNOSIS — Z319 Encounter for procreative management, unspecified: Secondary | ICD-10-CM | POA: Diagnosis not present

## 2017-11-20 DIAGNOSIS — N912 Amenorrhea, unspecified: Secondary | ICD-10-CM | POA: Diagnosis not present

## 2017-12-02 DIAGNOSIS — Z3189 Encounter for other procreative management: Secondary | ICD-10-CM | POA: Diagnosis not present

## 2017-12-02 DIAGNOSIS — H04123 Dry eye syndrome of bilateral lacrimal glands: Secondary | ICD-10-CM | POA: Diagnosis not present

## 2017-12-02 DIAGNOSIS — H1045 Other chronic allergic conjunctivitis: Secondary | ICD-10-CM | POA: Diagnosis not present

## 2017-12-05 DIAGNOSIS — Z3189 Encounter for other procreative management: Secondary | ICD-10-CM | POA: Diagnosis not present

## 2018-01-06 DIAGNOSIS — H00023 Hordeolum internum right eye, unspecified eyelid: Secondary | ICD-10-CM | POA: Diagnosis not present

## 2018-01-13 DIAGNOSIS — H00023 Hordeolum internum right eye, unspecified eyelid: Secondary | ICD-10-CM | POA: Diagnosis not present

## 2018-03-01 DIAGNOSIS — F411 Generalized anxiety disorder: Secondary | ICD-10-CM | POA: Diagnosis not present

## 2018-03-17 ENCOUNTER — Telehealth: Payer: Self-pay | Admitting: Obstetrics and Gynecology

## 2018-03-17 NOTE — Telephone Encounter (Signed)
Patient says she is not pregnant and was advised by [facility] that she need to follow up with Dr Oscar La.

## 2018-03-17 NOTE — Telephone Encounter (Signed)
Spoke with patient.  She is under the care of [facility]. She has had two rounds of IUI. She states she has gained weight and thinks she would like to take a break from treatments due to stress on her body and weight gain. Reports cycles have been irregular.  Last IUI was at the end of July. Had a cycle in July after a negative pregnancy test and had another cycle in August, LMP 02/06/18.  Has not taken a pregnancy test this month. Reports she feels "bloated." Denies any pain at this time.  Advised needs to take a pregnancy test today and if negative and no cycle in two weeks test again and call back for office visit. She states she has called [facility] as well for advice and is waiting for a call back from them.   Discussed bleeding/ectopic precautions and when to seek emergency care, pt verbalized understanding of all instructions. She will call back with any further questions or concerns.   Routing to Dr. Oscar La to review.

## 2018-03-17 NOTE — Telephone Encounter (Signed)
I'm happy to see her. You can make her an appointment.

## 2018-03-17 NOTE — Telephone Encounter (Signed)
Patient having pain before and after cycle.

## 2018-03-18 NOTE — Telephone Encounter (Signed)
Spoke with patient. OV scheduled for 04/02/18 at 2:15pm with Dr. Oscar La. Patient declined earlier OV at this time, states she will review her schedule and return call if can be seen earlier. Patient verbalizes understanding.   Routing to provider for final review. Patient is agreeable to disposition. Will close encounter.

## 2018-03-19 ENCOUNTER — Ambulatory Visit: Payer: Self-pay | Admitting: Obstetrics and Gynecology

## 2018-03-20 ENCOUNTER — Encounter: Payer: Self-pay | Admitting: Obstetrics and Gynecology

## 2018-03-20 ENCOUNTER — Ambulatory Visit: Payer: BLUE CROSS/BLUE SHIELD | Admitting: Obstetrics and Gynecology

## 2018-03-20 ENCOUNTER — Other Ambulatory Visit: Payer: Self-pay

## 2018-03-20 VITALS — BP 110/78 | HR 70 | Resp 14 | Ht <= 58 in | Wt 123.1 lb

## 2018-03-20 DIAGNOSIS — R635 Abnormal weight gain: Secondary | ICD-10-CM

## 2018-03-20 DIAGNOSIS — N914 Secondary oligomenorrhea: Secondary | ICD-10-CM

## 2018-03-20 LAB — POCT URINE PREGNANCY: Preg Test, Ur: NEGATIVE

## 2018-03-20 MED ORDER — MEDROXYPROGESTERONE ACETATE 5 MG PO TABS: 5.0000 mg | ORAL_TABLET | Freq: Every day | ORAL | 0 refills | Status: DC

## 2018-03-20 NOTE — Progress Notes (Signed)
GYNECOLOGY  VISIT   HPI: 22 y.o.   Married Black or [place] American Not Hispanic or Latino  female   [id number] with Patient's last menstrual period was 02/06/2018.   here for   Irregular menses. She has a female partner, married. She was referred to the infertility clinic. She did 2 IUI cycles, last one was in June. Prior to that her cycles were normal. Since then cycles have been irregular. The IUI in June, faint + UPT, then negative.  Cycle on 11/22/17, did IUD on 6/28. Next cycle was July 7, then August 22, now overdue.  No increase in stress. She has gained 12 lbs since 1/19, slight constipation. No hair loss, no unexplained fatigue. No galactorrhea. No hirsutism.   GYNECOLOGIC HISTORY: Patient's last menstrual period was 02/06/2018. Contraception: none  Menopausal hormone therapy: none        OB History    Gravida  0   Para  0   Term  0   Preterm  0   AB  0   Living  0     SAB  0   TAB  0   Ectopic  0   Multiple  0   Live Births  0              Patient Active Problem List   Diagnosis Date Noted  . 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 Variants of migraine, not elsewhere classified, without mention of intractable migraine without mention of status migrainosus 09/11/2012  . Unspecified constipation 09/11/2012  . Syncope 08/31/2010  . Neck Pain 08/31/2010  . Insomnia 08/31/2010    Past Medical History:  Diagnosis Date  . Allergic rhinitis   . Asthma   . Eczema   . Environmental allergies    Age 57 years  . Generalized headaches    Began at age 35 years  . Influenza    Age 28 years  . 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 Streptococcal infection(041.00)    Age 64 and 22 years old    Past Surgical History:  Procedure Laterality Date  . TONSILLECTOMY  2012  . WISDOM TOOTH EXTRACTION      Current Outpatient Medications  Medication Sig Dispense Refill  . albuterol (PROVENTIL HFA;VENTOLIN HFA) 108 (90 Base) MCG/ACT inhaler Inhale 2 puffs  into the lungs every 6 (six) hours as needed for wheezing or shortness of breath. 2 Inhaler 3  . Omega-3 Fatty Acids (FISH OIL PO) Take by mouth every other day.    . Prenatal Vit-Fe Fumarate-FA (PRENATAL VITAMIN PO) Take by mouth.     No current facility-administered medications for this visit.      ALLERGIES: Patient has no known allergies.  Family History  Problem Relation Age of Onset  . Migraines Paternal Grandmother   . Migraines Paternal Grandfather   . HIV Father   . Diabetes Mother   . Hypertension Mother   . Heart disease Mother        No details  . Anemia Mother   . Anemia Sister   . Migraines Paternal Aunt   . Migraines Maternal Uncle        Maternal Great Uncle  . Diabetes Unknown        Family History  . Hypertension Unknown        Family History  . Depression Unknown        Family History  . Asthma Unknown        Family History  .  Allergic rhinitis Unknown        Family History    Social History   Socioeconomic History  . Marital status: Single    Spouse name: Not on file  . Number of children: Not on file  . Years of education: Not on file  . Highest education level: Not on file  Occupational History  . Not on file  Social Needs  . Financial resource strain: Not on file  . Food insecurity:    Worry: Not on file    Inability: Not on file  . Transportation needs:    Medical: Not on file    Non-medical: Not on file  Tobacco Use  . Smoking status: Passive Smoke Exposure - Never Smoker  . Smokeless tobacco: Never Used  Substance and Sexual Activity  . Alcohol use: Yes    Alcohol/week: 1.0 - 2.0 standard drinks    Types: 1 - 2 Glasses of wine per week  . Drug use: No  . Sexual activity: Yes    Partners: Female    Birth control/protection: None  Lifestyle  . Physical activity:    Days per week: Not on file    Minutes per session: Not on file  . Stress: Not on file  Relationships  . Social connections:    Talks on phone: Not on file     Gets together: Not on file    Attends religious service: Not on file    Active member of club or organization: Not on file    Attends meetings of clubs or organizations: Not on file    Relationship status: Not on file  . Intimate partner violence:    Fear of current or ex partner: Not on file    Emotionally abused: Not on file    Physically abused: Not on file    Forced sexual activity: Not on file  Other Topics Concern  . 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     Review of Systems  Constitutional: Positive for unexpected weight change.       Weight gain    HENT: Negative.   Eyes: Negative.   Respiratory: Negative.   Cardiovascular: Negative.   Gastrointestinal: Negative.   Endocrine: Negative.   Genitourinary: Positive for menstrual problem.       Irregular menses   Musculoskeletal: Negative.   Allergic/Immunologic: Negative.   Neurological: Negative.   Hematological: Negative.   Psychiatric/Behavioral: Negative.     PHYSICAL EXAMINATION:    BP 110/78 (BP Location: Right Arm, Patient Position: Sitting, Cuff Size: Normal)   Pulse 70   Resp 14   Ht 4\' 9"  (1.448 m)   Wt 123 lb 1.6 oz (55.8 kg)   LMP 02/06/2018   BMI 26.64 kg/m     General appearance: alert, cooperative and appears stated age Neck: no adenopathy, supple, symmetrical, trachea midline and thyroid normal to inspection and palpation   ASSESSMENT Recent oligomenorrhea S/P 2 IUI cycles, last in June Only sexually active with her wife Weight gain, constipation    PLAN UPT negative TSH, prolactin Provera w/d Her wife will do the IUI cycle next    An After Visit Summary was printed and given to the patient.  ~15 minutes face to face time of which over 50% was spent in counseling.

## 2018-03-20 NOTE — Patient Instructions (Signed)
Take provera, 1 tablet a day for 5 days. You should get a cycle within ~1 week (2 at the most) after finishing the provera. Call with or without a cycle.

## 2018-03-21 LAB — PROLACTIN: Prolactin: 12.4 ng/mL (ref 4.8–23.3)

## 2018-03-21 LAB — TSH: TSH: 0.877 u[IU]/mL (ref 0.450–4.500)

## 2018-03-23 ENCOUNTER — Ambulatory Visit (HOSPITAL_COMMUNITY)
Admission: EM | Admit: 2018-03-23 | Discharge: 2018-03-23 | Discharge status: Against Medical Advice | Payer: BLUE CROSS/BLUE SHIELD

## 2018-03-24 ENCOUNTER — Telehealth: Payer: Self-pay

## 2018-03-24 NOTE — Telephone Encounter (Signed)
-----   Message from Romualdo Bolk, MD sent at 03/21/2018  3:00 PM EDT ----- Please advise the patient of normal results.

## 2018-03-24 NOTE — Telephone Encounter (Signed)
Left message to call Lamar Naef at [phone]. 

## 2018-03-24 NOTE — Telephone Encounter (Signed)
Spoke with patient. Advised of message as seen below from Dr.Jertson. Patient verbalizes understanding. Advised RN spoke with Dr.Yalcinkaya's office this morning and they are willing to schedule an appointment for her with Dr.Yalcinkaya directly. Patient will need to call the [facility] location to schedule. Patient is agreeable and will return call if she needs assistance. Encounter closed.

## 2018-03-28 ENCOUNTER — Telehealth: Payer: Self-pay | Admitting: Obstetrics and Gynecology

## 2018-03-28 NOTE — Telephone Encounter (Signed)
Spoke with patient. Patient calling with Provera update. Completed 5mg  x5 days, menses started on 03/27/18.   Advised patient to continue to monitor and calendar menses. Return call if any heavy bleeding or missed cycles. Advised will update Dr. Oscar La, will return call if any additional recommendations.   Routing to provider for final review. Patient is agreeable to disposition. Will close encounter.

## 2018-03-28 NOTE — Telephone Encounter (Signed)
Patient has a question for the nurse about her cycle.

## 2018-04-02 ENCOUNTER — Ambulatory Visit: Payer: Self-pay | Admitting: Obstetrics and Gynecology

## 2018-04-10 ENCOUNTER — Encounter: Payer: BLUE CROSS/BLUE SHIELD | Admitting: Family Medicine

## 2018-04-21 DIAGNOSIS — H04123 Dry eye syndrome of bilateral lacrimal glands: Secondary | ICD-10-CM | POA: Diagnosis not present

## 2018-04-21 DIAGNOSIS — H04121 Dry eye syndrome of right lacrimal gland: Secondary | ICD-10-CM | POA: Diagnosis not present

## 2018-04-21 DIAGNOSIS — H04122 Dry eye syndrome of left lacrimal gland: Secondary | ICD-10-CM | POA: Diagnosis not present

## 2018-04-30 ENCOUNTER — Ambulatory Visit (INDEPENDENT_AMBULATORY_CARE_PROVIDER_SITE_OTHER): Payer: BLUE CROSS/BLUE SHIELD

## 2018-04-30 ENCOUNTER — Encounter (HOSPITAL_COMMUNITY): Payer: Self-pay | Admitting: Emergency Medicine

## 2018-04-30 ENCOUNTER — Ambulatory Visit (HOSPITAL_COMMUNITY)
Admission: EM | Admit: 2018-04-30 | Discharge: 2018-04-30 | Disposition: A | Discharge status: Home / Self Care | Payer: BLUE CROSS/BLUE SHIELD | Attending: Family Medicine | Admitting: Family Medicine

## 2018-04-30 DIAGNOSIS — R079 Chest pain, unspecified: Secondary | ICD-10-CM

## 2018-04-30 MED ORDER — NAPROXEN 500 MG PO TABS: 500.0000 mg | ORAL_TABLET | Freq: Two times a day (BID) | ORAL | 0 refills | Status: DC

## 2018-04-30 NOTE — ED Triage Notes (Signed)
Pt c/o chest pain x1 day, thinks its anxiety, is under a lot of stress.

## 2018-05-01 ENCOUNTER — Ambulatory Visit: Payer: BLUE CROSS/BLUE SHIELD | Admitting: Family Medicine

## 2018-05-01 ENCOUNTER — Encounter: Payer: Self-pay | Admitting: Family Medicine

## 2018-05-01 VITALS — BP 102/62 | HR 72 | Temp 98.5°F | Ht <= 58 in | Wt 125.4 lb

## 2018-05-01 DIAGNOSIS — B9789 Other viral agents as the cause of diseases classified elsewhere: Secondary | ICD-10-CM

## 2018-05-01 DIAGNOSIS — G43109 Migraine with aura, not intractable, without status migrainosus: Secondary | ICD-10-CM | POA: Diagnosis not present

## 2018-05-01 DIAGNOSIS — J069 Acute upper respiratory infection, unspecified: Secondary | ICD-10-CM | POA: Diagnosis not present

## 2018-05-01 DIAGNOSIS — Z1371 Encounter for nonprocreative screening for genetic disease carrier status: Secondary | ICD-10-CM

## 2018-05-01 DIAGNOSIS — L243 Irritant contact dermatitis due to cosmetics: Secondary | ICD-10-CM | POA: Diagnosis not present

## 2018-05-01 DIAGNOSIS — R040 Epistaxis: Secondary | ICD-10-CM

## 2018-05-01 MED ORDER — TRIAMCINOLONE ACETONIDE 0.1 % EX CREA: 1.0000 "application " | TOPICAL_CREAM | Freq: Two times a day (BID) | CUTANEOUS | 0 refills | Status: AC

## 2018-05-01 MED ORDER — METHYLPREDNISOLONE ACETATE 80 MG/ML IJ SUSP
80.0000 mg | Freq: Once | INTRAMUSCULAR | Status: AC
  Administered 2018-05-01: 80 mg via INTRAMUSCULAR

## 2018-05-01 MED ORDER — SUMATRIPTAN SUCCINATE 100 MG PO TABS: 100.0000 mg | ORAL_TABLET | ORAL | 0 refills | Status: DC | PRN

## 2018-05-01 NOTE — Progress Notes (Signed)
Pre visit review using our clinic review tool, if applicable. No additional management support is needed unless otherwise documented below in the visit note. 

## 2018-05-01 NOTE — Addendum Note (Signed)
Addended by: Scharlene GlossEWING, ROBIN B on: 05/01/2018 11:13 AM   Modules accepted: Orders

## 2018-05-01 NOTE — Progress Notes (Signed)
Chief Complaint  Patient presents with  . Headache    for 2 weeks  . Cough  . Rash    Maria Smith is a 22 y.o. female here for a skin complaint.  Duration: 1 week Location: legs, trunk Pruritic? Yes Painful? No Drainage? No New soaps/lotions/topicals/detergents? New soap Sick contacts? No Other associated symptoms: none Therapies tried thus far: HC cream, Aveeno  Duration: 2 days  Associated symptoms: sinus congestion, rhinorrhea, sore throat and cough Denies: sinus pain, itchy watery eyes, ear pain, ear drainage, wheezing, shortness of breath, myalgia and fevers Treatment to date: tea Sick contacts: No  Hx of asthma, using albuterol more often.   +nosebleed this AM. Not using air humidifier.  2 weeks of HA's. Severe to her, similar to migraines. Sensitive to light and noise. Last a couple hours. B/l headache, sometimes will have neck pain. No neurologic s/s's. Feels like previous migraines, was on Elavil as abortive (not prophylaxis). Occular pressure serves as her aura. Has not been using anything at home. Stress is a trigger, has just bought a house and dealing with insurance issues.   ROS:  Const: No fevers Ears: No ear pain Nose: +congestion Lungs: +cough Neuro: +headache MSK: +neck pain Skin: +itching Heme: +nosebleed Heart: No CP Throat: +ST Eyes: No vision changes  Past Medical History:  Diagnosis Date  . Allergic rhinitis   . Asthma   . Eczema   . Environmental allergies    Age 22 years  . Generalized headaches    Began at age 22 years  . Influenza    Age 55 years  . 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 Streptococcal infection(041.00)    Age 22 and 22 years old   Family History  Problem Relation Age of Onset  . Migraines Paternal Grandmother   . Migraines Paternal Grandfather   . HIV Father   . Diabetes Mother   . Hypertension Mother   . Heart disease Mother        No details  . Anemia Mother   . Anemia  Sister   . Migraines Paternal Aunt   . Migraines Maternal Uncle        Maternal Great Uncle  . Diabetes Unknown        Family History  . Hypertension Unknown        Family History  . Depression Unknown        Family History  . Asthma Unknown        Family History  . Allergic rhinitis Unknown        Family History   No Known Allergies  Past Surgical History:  Procedure Laterality Date  . TONSILLECTOMY  2012  . WISDOM TOOTH EXTRACTION      BP 102/62 (BP Location: Left Arm, Patient Position: Sitting, Cuff Size: Normal)   Pulse 72   Temp 98.5 F (36.9 C) (Oral)   Ht 4\' 9"  (1.448 m)   Wt 125 lb 6 oz (56.9 kg)   SpO2 97%   BMI 27.13 kg/m  Gen: awake, alert, appearing stated age Heart: RRR Lungs: CTAB. No accessory muscle use Neuro: DTR's equal and symmetric throughout, no clonus or cerebellar signs Eyes: PERRLA HEENT: Ears patent, TM's neg, nares edematous on L compared to R, no d/c, pharynx pink w/o exudate.  Skin: Excoriation and mild erythema over torso/sides. 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  #4- Stop soap, start cream. #5- discussed her concern for genetic issues in light of some new family health discoveries. She is trying to conceive. Advised her to bring as much famhx as possible to that appt. F/u in 1 mo. The patient voiced understanding and agreement to the plan.  Jilda Roche Hatley, DO 05/01/18 11:01 AM

## 2018-05-01 NOTE — ED Provider Notes (Signed)
[facility]   [id number] 04/30/18 Arrival Time: 1809  ASSESSMENT & PLAN:  1. Chest pain, unspecified type   Reassured that I do not feel this is a lung or heart problem. She has seen a cardiologist in the past with a negative workup and normal ECHO. Declines ECG here this evening.  I have personally viewed the imaging studies ordered this visit. No acute cardo/pulmonary disease seen.  Imaging: Dg Chest 2 View  Result Date: 04/30/2018 CLINICAL DATA:  Chest pain. EXAM: CHEST - 2 VIEW COMPARISON:  None. FINDINGS: The heart size and mediastinal contours are within normal limits. Both lungs are clear. The visualized skeletal structures are unremarkable. IMPRESSION: No active cardiopulmonary disease. Electronically Signed   By: Gerome Sam III M.D   On: 04/30/2018 19:57   Suspect costochondral etiology. Trial of: Meds ordered this encounter  Medications  . naproxen (NAPROSYN) 500 MG tablet    Sig: Take 1 tablet (500 mg total) by mouth 2 (two) times daily.    Dispense:  14 tablet    Refill:  0   Follow-up Information    Schedule an appointment as soon as possible for a visit  with Sharlene Dory, DO.   Specialty:  Family Medicine Contact information: [address] [place] [address] [phone]          Chest pain precautions given. Reviewed expectations re: course of current medical issues. Questions answered. Outlined signs and symptoms indicating need for more acute intervention. Patient verbalized understanding. After Visit Summary given.   SUBJECTIVE:  Maria Smith is a 22 y.o. female who presents with complaint of:  "Chest Pain": Onset gradual since yesterday, maybe longer. H/O similar in the past with cardiology workup. Describes anterior chest discomfort that is fairly persistent. Describes discomfort as a "dull ache" with occasional sharp pains that last a few seconds then resolved. Discomfort does not  radiate. No back pain. No abdominal pain. No n/v/SOB reported. No LE edema. Ambulatory without difficulty. Does not recall any chest injury or recent strenuous activities. Does not wake her from sleep. No orthopnea or PND reported. Rates discomfort as a 3/10 in intensity. No current palpitations. No recent illnesses. No specific alleviating factors reported. No OTC treatment. Certain movements aggravate symptoms. Overall symptoms have not worsened since beginning.  Social History   Tobacco Use  Smoking Status Passive Smoke Exposure - Never Smoker  Smokeless Tobacco Never Used   Previous cardiac testing: ECHO without abnormal findings.  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 Finished treatment "but never followed up." No persistent coughing. No coughing of blood. No weight loss.  ROS: As per HPI. All other systems negative.  OBJECTIVE:  Vitals:   04/30/18 1849  BP: 106/67  Pulse: 65  Resp: 16  Temp: 98.7 F (37.1 C)  SpO2: 100%    General appearance: alert; no distress Eyes: PERRLA; EOMI; conjunctiva normal HENT: normocephalic; atraumatic Neck: supple; FROM Lungs: clear to auscultation bilaterally; unlabored respirations Heart: regular rate and rhythm without murmer Chest Wall: reproducible tenderness to palpation Abdomen: soft, non-tender; bowel sounds normal; no masses or organomegaly; no guarding or rebound tenderness Extremities: no cyanosis or edema; symmetrical with no gross deformities Skin: warm and dry Psychological: alert and cooperative; normal mood and affect  Labs Reviewed: Results for orders placed or performed in visit on 03/20/18  TSH  Result Value Ref Range   TSH 0.877 0.450 - 4.500 uIU/mL  Prolactin  Result Value  Ref Range   Prolactin 12.4 4.8 - 23.3 ng/mL  POCT urine pregnancy  Result Value Ref Range   Preg Test, Ur Negative Negative    No Known Allergies  Past Medical History:  Diagnosis Date  . Allergic rhinitis   . Asthma    . Eczema   . Environmental allergies    Age 2 years  . Generalized headaches    Began at age 22 years  . Influenza    Age 85 years  . 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 Streptococcal infection(041.00)    Age 22 and 22 years old   Social History   Socioeconomic History  . Marital status: Single    Spouse name: Not on file  . Number of children: Not on file  . Years of education: Not on file  . Highest education level: Not on file  Occupational History  . Not on file  Social Needs  . Financial resource strain: Not on file  . Food insecurity:    Worry: Not on file    Inability: Not on file  . Transportation needs:    Medical: Not on file    Non-medical: Not on file  Tobacco Use  . Smoking status: Passive Smoke Exposure - Never Smoker  . Smokeless tobacco: Never Used  Substance and Sexual Activity  . Alcohol use: Yes    Alcohol/week: 1.0 - 2.0 standard drinks    Types: 1 - 2 Glasses of wine per week  . Drug use: No  . Sexual activity: Yes    Partners: Female    Birth control/protection: None  Lifestyle  . Physical activity:    Days per week: Not on file    Minutes per session: Not on file  . Stress: Not on file  Relationships  . Social connections:    Talks on phone: Not on file    Gets together: Not on file    Attends religious service: Not on file    Active member of club or organization: Not on file    Attends meetings of clubs or organizations: Not on file    Relationship status: Not on file  . Intimate partner violence:    Fear of current or ex partner: Not on file    Emotionally abused: Not on file    Physically abused: Not on file    Forced sexual activity: Not on file  Other Topics Concern  . 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    Family History  Problem Relation Age of Onset  . Migraines Paternal Grandmother   . Migraines Paternal Grandfather   . HIV Father   .  Diabetes Mother   . Hypertension Mother   . Heart disease Mother        No details  . Anemia Mother   . Anemia Sister   . Migraines Paternal Aunt   . Migraines Maternal Uncle        Maternal Great Uncle  . Diabetes Unknown        Family History  . Hypertension Unknown        Family History  . Depression Unknown        Family History  . Asthma Unknown        Family History  . Allergic rhinitis Unknown        Family History   Past Surgical History:  Procedure Laterality Date  .  TONSILLECTOMY  2012  . WISDOM TOOTH EXTRACTION       Mardella Layman, MD 05/01/18 1010

## 2018-05-01 NOTE — Patient Instructions (Addendum)
Make sure you bring as much detail with your family history as possible to your genetics appointment. If you do not hear anything about your referral in the next 1-2 weeks, call our office and ask for an update.  Use an air humidifier. Consider Vaseline and triple antibiotic ointment to help with nosebleeds.   Continue to push fluids, practice good hand hygiene, and cover your mouth if you cough.  If you start having fevers, shaking or shortness of breath, seek immediate care.  Stop your Dove soap and go back to Dial. Continue using Aveeno to moisturize skin.  Use the sumatriptan (Imitrex) when you feel the ocular pressure signifying a headache is coming.  Let us know if you need anything.

## 2018-05-09 ENCOUNTER — Ambulatory Visit: Payer: BLUE CROSS/BLUE SHIELD | Admitting: Internal Medicine

## 2018-05-09 ENCOUNTER — Encounter: Payer: Self-pay | Admitting: Internal Medicine

## 2018-05-09 ENCOUNTER — Ambulatory Visit: Payer: Self-pay | Admitting: *Deleted

## 2018-05-09 VITALS — BP 108/72 | HR 62 | Temp 98.2°F | Resp 16 | Ht <= 58 in | Wt 125.5 lb

## 2018-05-09 DIAGNOSIS — J4521 Mild intermittent asthma with (acute) exacerbation: Secondary | ICD-10-CM | POA: Diagnosis not present

## 2018-05-09 MED ORDER — BUDESONIDE-FORMOTEROL FUMARATE 160-4.5 MCG/ACT IN AERO: 2.0000 | INHALATION_SPRAY | Freq: Two times a day (BID) | RESPIRATORY_TRACT | 5 refills | Status: DC

## 2018-05-09 NOTE — Patient Instructions (Signed)
Start taking Symbicort, 2 puffs twice a day for the next 4 weeks  You still can use albuterol as your rescue inhaler you have cough or wheezing.  Okay to take Mucinex DM if you have some mucus  Call if you are not gradually better  Call if your symptoms resurface in 4 weeks  Seek immediate attention if you have severe symptoms, cough, wheezing, shortness of breath

## 2018-05-09 NOTE — Progress Notes (Signed)
Subjective:    Patient ID: Valerie RoysInfinity C McQueen Guimont, female    DOB: [birth year], 22 y.o.   MRN: [id number]  DOS:  05/09/2018 Type of visit - description : acute Patient was seen here 05/01/2018, at the time she was diagnosed with a URI, epistaxis, migraine, contact dermatitis. Was given IM steroids,  Imitrex.  She is here because respiratory symptoms. After last visit, she did well but 3 or 4 nights ago she started to develop moderate cough at night along with wheezing and shortness of breath.  Symptoms responds well to albuterol.  Review of Systems  Denies fever chills  Had minimal amount of yellow sputum with cough.  That is better. Not on birth control pills, last period  5 days ago, she is just finished. No major problems with sinus pain or congestion  Past Medical History:  Diagnosis Date  . Allergic rhinitis   . Asthma   . Eczema   . Environmental allergies    Age 31 years  . Generalized headaches    Began at age 22 years  . Influenza    Age 24 years  . 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 Streptococcal infection(041.00)    Age 22 and 22 years old    Past Surgical History:  Procedure Laterality Date  . TONSILLECTOMY  2012  . WISDOM TOOTH EXTRACTION      Social History   Socioeconomic History  . Marital status: Single    Spouse name: Not on file  . Number of children: Not on file  . Years of education: Not on file  . Highest education level: Not on file  Occupational History  . Not on file  Social Needs  . Financial resource strain: Not on file  . Food insecurity:    Worry: Not on file    Inability: Not on file  . Transportation needs:    Medical: Not on file    Non-medical: Not on file  Tobacco Use  . Smoking status: Passive Smoke Exposure - Never Smoker  . Smokeless tobacco: Never Used  Substance and Sexual Activity  . Alcohol use: Yes    Alcohol/week: 1.0 - 2.0 standard drinks    Types: 1 - 2 Glasses of wine per week  . Drug  use: No  . Sexual activity: Yes    Partners: Female    Birth control/protection: None  Lifestyle  . Physical activity:    Days per week: Not on file    Minutes per session: Not on file  . Stress: Not on file  Relationships  . Social connections:    Talks on phone: Not on file    Gets together: Not on file    Attends religious service: Not on file    Active member of club or organization: Not on file    Attends meetings of clubs or organizations: Not on file    Relationship status: Not on file  . Intimate partner violence:    Fear of current or ex partner: Not on file    Emotionally abused: Not on file    Physically abused: Not on file    Forced sexual activity: Not on file  Other Topics Concern  . 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       Allergies as of 05/09/2018   No Known Allergies     Medication List  Accurate as of 05/09/18  2:55 PM. Always use your most recent med list.          albuterol 108 (90 Base) MCG/ACT inhaler Commonly known as:  PROVENTIL HFA;VENTOLIN HFA Inhale 2 puffs into the lungs every 6 (six) hours as needed for wheezing or shortness of breath.   FISH OIL PO Take by mouth every other day.   naproxen 500 MG tablet Commonly known as:  NAPROSYN Take 1 tablet (500 mg total) by mouth 2 (two) times daily.   PRENATAL VITAMIN PO Take by mouth.   SUMAtriptan 100 MG tablet Commonly known as:  IMITREX Take 1 tablet (100 mg total) by mouth every 2 (two) hours as needed for migraine. May repeat in 2 hours if headache persists or recurs.           Objective:   Physical Exam BP 108/72 (BP Location: Left Arm, Patient Position: Sitting, Cuff Size: Small)   Pulse 62   Temp 98.2 F (36.8 C) (Oral)   Resp 16   Ht 4\' 9"  (1.448 m)   Wt 125 lb 8 oz (56.9 kg)   LMP 05/04/2018 (Exact Date)   SpO2 98%   BMI 27.16 kg/m   General:   Well developed, NAD, BMI noted. HEENT:    Normocephalic . Face symmetric, atraumatic.  TMs normal throat symmetric and not red Lungs:  CTA B Normal respiratory effort, no intercostal retractions, no accessory muscle use. Heart: RRR,  no murmur.  No pretibial edema bilaterally  Skin: Not pale. Not jaundice Neurologic:  alert & oriented X3.  Speech normal, gait appropriate for age and unassisted Psych--  Cognition and judgment appear intact.  Cooperative with normal attention span and concentration.  Behavior appropriate. No anxious or depressed appearing.      Assessment & Plan:   22 year old female, history of migraines, insomnia, positive PPD presents with  Asthma: Moderate cough and wheezing after a recent URI likely due to asthma. Previously, asthma was very well controlled. Plan: Symbicort for 4 weeks, continue albuterol, call if not improving.  See AVS.

## 2018-05-09 NOTE — Telephone Encounter (Signed)
Contacted pt regarding symptoms; she said that her asthma, wheezing, chest tightness and coughing are getting worse especially at night; this started a week ago; she reports that her cough is productive with clear sputum; her rescue inhaler has helped; she has taken Theraflu for 3 days and robitussin DM for the past 2 days; recommendations made per nurse triage protocol; pt offered but unable to accept appointment with Dr Carmelia RollerWendling 05/09/18 at 1015 because she is at work; pt offered and accepted appointment with Dr Willow OraJose Paz, [facility], 05/09/18 at 1420; will route to office for notification of this upcoming appointment.  Reason for Disposition . [1] MODERATE longstanding difficulty breathing (e.g., speaks in phrases, SOB even at rest, pulse 100-120) AND [2] SAME as normal  Answer Assessment - Initial Assessment Questions 1. RESPIRATORY STATUS: "Describe your breathing?" (e.g., wheezing, shortness of breath, unable to speak, severe coughing)      Chest tightness; wheezing and shortness of breath worse at night   2. ONSET: "When did this breathing problem begin?"      05/05/18 3. PATTERN "Does the difficult breathing come and go, or has it been constant since it started?"      intermittent 4. SEVERITY: "How bad is your breathing?" (e.g., mild, moderate, severe)    - MILD: No SOB at rest, mild SOB with walking, speaks normally in sentences, can lay down, no retractions, pulse < 100.    - MODERATE: SOB at rest, SOB with minimal exertion and prefers to sit, cannot lie down flat, speaks in phrases, mild retractions, audible wheezing, pulse 100-120.    - SEVERE: Very SOB at rest, speaks in single words, struggling to breathe, sitting hunched forward, retractions, pulse > 120     Mild to moderate 5. RECURRENT SYMPTOM: "Have you had difficulty breathing before?" If so, ask: "When was the last time?" and "What happened that time?"     Yes 2.5.years ago; took TB medication fir 4 months; did not follow up  with health dept to get xray 6. CARDIAC HISTORY: "Do you have any history of heart disease?" (e.g., heart attack, angina, bypass surgery, angioplasty)      no 7. LUNG HISTORY: "Do you have any history of lung disease?"  (e.g., pulmonary embolus, asthma, emphysema)   asthma 8. CAUSE: "What do you think is causing the breathing problem?"      "The air where I am or the weather" 9. OTHER SYMPTOMS: "Do you have any other symptoms? (e.g., dizziness, runny nose, cough, chest pain, fever)    chest tightness 10. PREGNANCY: "Is there any chance you are pregnant?" "When was your last menstrual period?"       No LMP 05/04/18 11. TRAVEL: "Have you traveled out of the country in the last month?" (e.g., travel history, exposures)       no  Protocols used: BREATHING DIFFICULTY-A-AH

## 2018-05-09 NOTE — Telephone Encounter (Signed)
noted 

## 2018-05-09 NOTE — Telephone Encounter (Signed)
FYI patient has appt scheduled with you today.

## 2018-05-09 NOTE — Progress Notes (Signed)
Pre visit review using our clinic review tool, if applicable. No additional management support is needed unless otherwise documented below in the visit note. 

## 2018-05-20 ENCOUNTER — Telehealth: Payer: Self-pay | Admitting: Obstetrics and Gynecology

## 2018-05-20 NOTE — Telephone Encounter (Signed)
Spoke with patient. Patient reports intermittent pain 8/10 inside of vagina and lower pelvis. Symptoms started 4 days ago. Reports sharp pain while being sexually active with her wife last night, lasted for 3 min. Reports intermittent nausea, no vomiting;  milky, white, vaginal d/c, no odor. Has been using ovulation predictor strips, not ovulating. Menses with provera in October, LMP 05/04/18. Taking tylenol and motrin for pain, provides relief. Recommended OV for further evaluation, scheduled for 12/4 at 3:15pm with Dr. Oscar LaJertson.   ER/WH precautions provided for severe pain, new or worsening symptoms. Patient verbalizes understanding and is agreeable.   Routing to provider for final review. Patient is agreeable to disposition. Will close encounter.

## 2018-05-20 NOTE — Telephone Encounter (Signed)
Patient is having vaginal pain. 

## 2018-05-21 ENCOUNTER — Other Ambulatory Visit: Payer: Self-pay

## 2018-05-21 ENCOUNTER — Encounter: Payer: Self-pay | Admitting: Obstetrics and Gynecology

## 2018-05-21 ENCOUNTER — Ambulatory Visit (INDEPENDENT_AMBULATORY_CARE_PROVIDER_SITE_OTHER): Payer: BLUE CROSS/BLUE SHIELD | Admitting: Obstetrics and Gynecology

## 2018-05-21 VITALS — BP 112/72 | HR 72 | Wt 124.2 lb

## 2018-05-21 DIAGNOSIS — N94819 Vulvodynia, unspecified: Secondary | ICD-10-CM

## 2018-05-21 DIAGNOSIS — N941 Unspecified dyspareunia: Secondary | ICD-10-CM | POA: Diagnosis not present

## 2018-05-21 DIAGNOSIS — R102 Pelvic and perineal pain: Secondary | ICD-10-CM | POA: Diagnosis not present

## 2018-05-21 DIAGNOSIS — N893 Dysplasia of vagina, unspecified: Secondary | ICD-10-CM | POA: Diagnosis not present

## 2018-05-21 MED ORDER — LIDOCAINE 5 % EX OINT: TOPICAL_OINTMENT | CUTANEOUS | 0 refills | Status: DC

## 2018-05-21 NOTE — Progress Notes (Signed)
GYNECOLOGY  VISIT   HPI: 22 y.o.   Single Black or African American Not Hispanic or Latino  female   [id number] with Patient's last menstrual period was 05/04/2018 (exact date).   here for sharp pelvic pain in either side, intermittently for the last 4 days. Intense pain with orgasm on the right side 3 days ago with intercourse. The pain is sharp, stabbing pain 8/10 in severity lasts for 5-10 minutes. With orgasm the pain was 10/10 lasted about 3 minutes.  No fever, bowel or bladder c/o (see full ROS). She has some tenderness with any attempt at penetration. Always hurts on entry. She has never had sex with a female. Always has been this way. She c/o a vaginal d/c, creamy and white, no odor. Her discharge increased after taking fertility medication.      GYNECOLOGIC HISTORY: Patient's last menstrual period was 05/04/2018 (exact date). Contraception: None, female partner Menopausal hormone therapy: None       OB History    Gravida  0   Para  0   Term  0   Preterm  0   AB  0   Living  0     SAB  0   TAB  0   Ectopic  0   Multiple  0   Live Births  0              Patient Active Problem List   Diagnosis Date Noted  . 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 Migraine with aura and without status migrainosus, not intractable 09/11/2012  . Variants of migraine, not elsewhere classified, without mention of intractable migraine without mention of status migrainosus 09/11/2012  . Unspecified constipation 09/11/2012  . Syncope 08/31/2010  . Neck Pain 08/31/2010  . Insomnia 08/31/2010    Past Medical History:  Diagnosis Date  . Allergic rhinitis   . Asthma   . Eczema   . Environmental allergies    Age 11 years  . Generalized headaches    Began at age 22 years  . Influenza    Age 66 years  . 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 Streptococcal infection(041.00)    Age 22 and 22 years old    Past Surgical History:  Procedure Laterality Date  . TONSILLECTOMY  2012   . WISDOM TOOTH EXTRACTION      Current Outpatient Medications  Medication Sig Dispense Refill  . albuterol (PROVENTIL HFA;VENTOLIN HFA) 108 (90 Base) MCG/ACT inhaler Inhale 2 puffs into the lungs every 6 (six) hours as needed for wheezing or shortness of breath. 2 Inhaler 3  . budesonide-formoterol (SYMBICORT) 160-4.5 MCG/ACT inhaler Inhale 2 puffs into the lungs 2 (two) times daily. 10.2 g 5  . naproxen (NAPROSYN) 500 MG tablet Take 1 tablet (500 mg total) by mouth 2 (two) times daily. 14 tablet 0  . Prenatal Vit-Fe Fumarate-FA (PRENATAL VITAMIN PO) Take by mouth.    . Omega-3 Fatty Acids (FISH OIL PO) Take by mouth every other day.    . SUMAtriptan (IMITREX) 100 MG tablet Take 1 tablet (100 mg total) by mouth every 2 (two) hours as needed for migraine. May repeat in 2 hours if headache persists or recurs. (Patient not taking: Reported on 05/21/2018) 10 tablet 0   No current facility-administered medications for this visit.      ALLERGIES: Patient has no known allergies.  Family History  Problem Relation Age of Onset  . Migraines Paternal Grandmother   . Migraines Paternal Grandfather   .  HIV Father   . Diabetes Mother   . Hypertension Mother   . Heart disease Mother        No details  . Anemia Mother   . Anemia Sister   . Migraines Paternal Aunt   . Migraines Maternal Uncle        Maternal Great Uncle  . Diabetes Unknown        Family History  . Hypertension Unknown        Family History  . Depression Unknown        Family History  . Asthma Unknown        Family History  . Allergic rhinitis Unknown        Family History    Social History   Socioeconomic History  . Marital status: Single    Spouse name: Not on file  . Number of children: Not on file  . Years of education: Not on file  . Highest education level: Not on file  Occupational History  . Not on file  Social Needs  . Financial resource strain: Not on file  . Food insecurity:    Worry: Not on file     Inability: Not on file  . Transportation needs:    Medical: Not on file    Non-medical: Not on file  Tobacco Use  . Smoking status: Never Smoker  . Smokeless tobacco: Never Used  Substance and Sexual Activity  . Alcohol use: Yes    Alcohol/week: 1.0 - 2.0 standard drinks    Types: 1 - 2 Glasses of wine per week  . Drug use: No  . Sexual activity: Yes    Partners: Female    Birth control/protection: None    Comment: female partner  Lifestyle  . Physical activity:    Days per week: Not on file    Minutes per session: Not on file  . Stress: Not on file  Relationships  . Social connections:    Talks on phone: Not on file    Gets together: Not on file    Attends religious service: Not on file    Active member of club or organization: Not on file    Attends meetings of clubs or organizations: Not on file    Relationship status: Not on file  . Intimate partner violence:    Fear of current or ex partner: Not on file    Emotionally abused: Not on file    Physically abused: Not on file    Forced sexual activity: Not on file  Other Topics Concern  . 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     Review of Systems  Constitutional: Negative.   HENT: Negative.   Eyes: Negative.   Respiratory: Negative.   Cardiovascular: Negative.   Gastrointestinal: Negative.   Genitourinary:       Sharp pelvic pain  Musculoskeletal: Negative.   Skin: Negative.   Neurological: Negative.   Endo/Heme/Allergies: Negative.   Psychiatric/Behavioral: Negative.     PHYSICAL EXAMINATION:    BP 112/72 (BP Location: Right Arm, Patient Position: Sitting, Cuff Size: Normal)   Pulse 72   Wt 124 lb 3.2 oz (56.3 kg)   LMP 05/04/2018 (Exact Date)   BMI 26.88 kg/m     General appearance: alert, cooperative and appears stated age Abdomen: soft, mildly tender in the RLQ, no rebound, no guarding; non distended, no masses,  no organomegaly  Pelvic:  External  genitalia:  no lesions. Tender to palpation of the left vestibule.               Urethra:  normal appearing urethra with no masses, tenderness or lesions              Bartholins and Skenes: normal                 Vagina: normal appearing vagina with normal color and discharge, no lesions              Cervix: no cervical motion tenderness and no lesions              Bimanual Exam:  Uterus:  normal size, contour, position, consistency, mobility, non-tender              Adnexa: no masses, tender in the left adnexa.               Rectovaginal: deferred  Chaperone was present for exam.  ASSESSMENT Pelvic pain x 4 days, intermittent and sharp. Severe pain with orgasm Dyspareunia Vulvodynia    PLAN Return for GYN ultrasound Lidocaine ointment for prn use, if that isn't working will call in compounded cream for vulvodynia Affirm sent She will use ibuprofen and tylenol for pain   An After Visit Summary was printed and given to the patient.

## 2018-05-22 ENCOUNTER — Other Ambulatory Visit: Payer: Self-pay

## 2018-05-22 ENCOUNTER — Ambulatory Visit (INDEPENDENT_AMBULATORY_CARE_PROVIDER_SITE_OTHER): Payer: BLUE CROSS/BLUE SHIELD

## 2018-05-22 ENCOUNTER — Encounter: Payer: Self-pay | Admitting: Obstetrics and Gynecology

## 2018-05-22 ENCOUNTER — Ambulatory Visit: Payer: BLUE CROSS/BLUE SHIELD | Admitting: Obstetrics and Gynecology

## 2018-05-22 VITALS — BP 120/78 | HR 60 | Wt 128.0 lb

## 2018-05-22 DIAGNOSIS — R102 Pelvic and perineal pain: Secondary | ICD-10-CM

## 2018-05-22 LAB — VAGINITIS/VAGINOSIS, DNA PROBE
Candida Species: NEGATIVE
Gardnerella vaginalis: NEGATIVE
Trichomonas vaginosis: NEGATIVE

## 2018-05-22 NOTE — Progress Notes (Signed)
GYNECOLOGY  VISIT   HPI: 22 y.o.   Single Black or African American Not Hispanic or Latino  female   G0P0000 with Patient's last menstrual period was 05/04/2018 (exact date).   here for consult after PUS.  She was seen yesterday with a 4 day h/o pelvic pain, tender on exam. Today her pain is down to a 5/10 in severity.   GYNECOLOGIC HISTORY: Patient's last menstrual period was 05/04/2018 (exact date). Contraception: None, female partner Menopausal hormone therapy: None        OB History    Gravida  0   Para  0   Term  0   Preterm  0   AB  0   Living  0     SAB  0   TAB  0   Ectopic  0   Multiple  0   Live Births  0              Patient Active Problem List   Diagnosis Date Noted  . 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 Migraine with aura and without status migrainosus, not intractable 09/11/2012  . Variants of migraine, not elsewhere classified, without mention of intractable migraine without mention of status migrainosus 09/11/2012  . Unspecified constipation 09/11/2012  . Syncope 08/31/2010  . Neck Pain 08/31/2010  . Insomnia 08/31/2010    Past Medical History:  Diagnosis Date  . Allergic rhinitis   . Asthma   . Eczema   . Environmental allergies    Age 69 years  . Generalized headaches    Began at age 33 years  . Influenza    Age 70 years  . 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 Streptococcal infection(041.00)    Age 27 and 22 years old    Past Surgical History:  Procedure Laterality Date  . TONSILLECTOMY  2012  . WISDOM TOOTH EXTRACTION      Current Outpatient Medications  Medication Sig Dispense Refill  . albuterol (PROVENTIL HFA;VENTOLIN HFA) 108 (90 Base) MCG/ACT inhaler Inhale 2 puffs into the lungs every 6 (six) hours as needed for wheezing or shortness of breath. 2 Inhaler 3  . budesonide-formoterol (SYMBICORT) 160-4.5 MCG/ACT inhaler Inhale 2 puffs into the lungs 2 (two) times daily. 10.2 g 5  . lidocaine (XYLOCAINE) 5 %  ointment Apply a pea sized amount to the opening of the vagina 20 minutes prior to intercourse, wipe off prior to intercourse 30 g 0  . naproxen (NAPROSYN) 500 MG tablet Take 1 tablet (500 mg total) by mouth 2 (two) times daily. 14 tablet 0  . Omega-3 Fatty Acids (FISH OIL PO) Take by mouth every other day.    . Prenatal Vit-Fe Fumarate-FA (PRENATAL VITAMIN PO) Take by mouth.    . SUMAtriptan (IMITREX) 100 MG tablet Take 1 tablet (100 mg total) by mouth every 2 (two) hours as needed for migraine. May repeat in 2 hours if headache persists or recurs. (Patient not taking: Reported on 05/22/2018) 10 tablet 0   No current facility-administered medications for this visit.      ALLERGIES: Patient has no known allergies.  Family History  Problem Relation Age of Onset  . Migraines Paternal Grandmother   . Migraines Paternal Grandfather   . HIV Father   . Diabetes Mother   . Hypertension Mother   . Heart disease Mother        No details  . Anemia Mother   . Anemia Sister   . Migraines Paternal  Aunt   . Migraines Maternal Uncle        Maternal Great Uncle  . Diabetes Unknown        Family History  . Hypertension Unknown        Family History  . Depression Unknown        Family History  . Asthma Unknown        Family History  . Allergic rhinitis Unknown        Family History    Social History   Socioeconomic History  . Marital status: Single    Spouse name: Not on file  . Number of children: Not on file  . Years of education: Not on file  . Highest education level: Not on file  Occupational History  . Not on file  Social Needs  . Financial resource strain: Not on file  . Food insecurity:    Worry: Not on file    Inability: Not on file  . Transportation needs:    Medical: Not on file    Non-medical: Not on file  Tobacco Use  . Smoking status: Never Smoker  . Smokeless tobacco: Never Used  Substance and Sexual Activity  . Alcohol use: Yes    Alcohol/week: 1.0 - 2.0  standard drinks    Types: 1 - 2 Glasses of wine per week  . Drug use: No  . Sexual activity: Yes    Partners: Female    Birth control/protection: None    Comment: female partner  Lifestyle  . Physical activity:    Days per week: Not on file    Minutes per session: Not on file  . Stress: Not on file  Relationships  . Social connections:    Talks on phone: Not on file    Gets together: Not on file    Attends religious service: Not on file    Active member of club or organization: Not on file    Attends meetings of clubs or organizations: Not on file    Relationship status: Not on file  . Intimate partner violence:    Fear of current or ex partner: Not on file    Emotionally abused: Not on file    Physically abused: Not on file    Forced sexual activity: Not on file  Other Topics Concern  . 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     Review of Systems  Constitutional: Negative.   HENT: Negative.   Eyes: Negative.   Respiratory: Negative.   Cardiovascular: Negative.   Gastrointestinal: Negative.   Genitourinary:       Pelvic pain  Musculoskeletal: Negative.   Skin: Negative.   Neurological: Negative.   Endo/Heme/Allergies: Negative.   Psychiatric/Behavioral: Negative.     PHYSICAL EXAMINATION:    BP 120/78 (BP Location: Right Arm, Patient Position: Sitting, Cuff Size: Normal)   Pulse 60   Wt 128 lb (58.1 kg)   LMP 05/04/2018 (Exact Date)   BMI 27.70 kg/m     General appearance: alert, cooperative and appears stated age  Ultrasound images reviewed with the patient, she has a fair amount of fluid around her left ovary, suspect ruptured ovarian cyst.   ASSESSMENT Pelvic pain, suspect ruptured ovarian cyst. Pain is a little better today. No torsion. Vulvodynia, just given lidocaine ointment yesterday    PLAN Ibuprofen or tylenol for pain. Call if not improving Use lidocaine as needed with sexual  activity  F/U in one month for vulvodynia.    An After Visit Summary was printed and given to the patient.

## 2018-05-26 ENCOUNTER — Telehealth: Payer: Self-pay | Admitting: Obstetrics and Gynecology

## 2018-05-26 ENCOUNTER — Encounter: Payer: Self-pay | Admitting: Obstetrics and Gynecology

## 2018-05-26 ENCOUNTER — Other Ambulatory Visit: Payer: Self-pay

## 2018-05-26 ENCOUNTER — Ambulatory Visit: Payer: BLUE CROSS/BLUE SHIELD | Admitting: Obstetrics and Gynecology

## 2018-05-26 VITALS — BP 108/78 | HR 72 | Temp 98.8°F | Wt 126.6 lb

## 2018-05-26 DIAGNOSIS — R102 Pelvic and perineal pain: Secondary | ICD-10-CM

## 2018-05-26 DIAGNOSIS — N898 Other specified noninflammatory disorders of vagina: Secondary | ICD-10-CM | POA: Diagnosis not present

## 2018-05-26 DIAGNOSIS — R1032 Left lower quadrant pain: Secondary | ICD-10-CM | POA: Diagnosis not present

## 2018-05-26 DIAGNOSIS — R109 Unspecified abdominal pain: Secondary | ICD-10-CM

## 2018-05-26 NOTE — Telephone Encounter (Signed)
Call to patient.  She is at work right now. States that she was not "able to call out of work because she needs a note."  Reports pain in L side that is sharp, achy and stabbing. Radiates to her L hip. No fevers. No nausea, no vomiting.  Went to ED this morning at 0300 and left without being seen.  Spoke with on call MD this morning and Prescribed 800 mg Motrin. She is not taking that at this time as she is at work.  States at 0700 this morning she felt "the worst pain." but states pain stays at 10/10.  Eating and drinking some. Feels bloated.  Advised patient recommending return to ED for evaluation since she remains at 10/10 pain. Declines.  Offered office visit with Dr. Hyacinth MeekerMiller today at 0900 in our office. She declines this option.   Advised work in appointment with Dr. Oscar LaJertson this afternoon.  She requests note to remain out of work as she is having pain. Aware of recommendations to to to ED or [facility] Ad[facility] Unit at [facility] for evaluation.  Message to Dr. Oscar LaJertson. Office visit this afternoon.

## 2018-05-26 NOTE — Telephone Encounter (Signed)
She is still having a lot of pain. Called the on call doctor who prescribed her 800 mg ibuprofen. She says she is out of breath and pain is worse this morning.

## 2018-05-26 NOTE — Telephone Encounter (Signed)
I returned a call from the answering service.  The patient reports that she has a ruptured ovarian cyst and she complains of pain.  Management of pain discussed.  I told the patient she can take 800 mg of ibuprofen every 8 hours.  She was told that she can come to the emergency department to be evaluated if her pain is not relieved.  The patient chooses to take ibuprofen.  I recommended that the patient call her primary physician tomorrow for further evaluation.  Dr. Stefano GaulStringer

## 2018-05-26 NOTE — Progress Notes (Signed)
GYNECOLOGY  VISIT   HPI: 22 y.o.   Single Black or African American Not Hispanic or Latino  female   G0P0000 with Patient's last menstrual period was 05/04/2018 (exact date).   here for ongoing pelvic pain. Reports pain is 10/10. With Ibuprofen 7/10. Reports having a temperature of 100.3 F. Temp is now 98.8 F. Reports she is having nausea. Denies any vomiting.  Her pain has been going on for over a week. The pain is an intermittent, frequent stabbing pain in her LLQ. Worse with activity. Normal BM qd. No bladder symptoms. Low grade temp this am.  Last Thursday she had an essentially normal pelvic ultrasound with some fluid around her ovary, suspicious for a ruptured ovarian cyst.  She notices a slight yellow vaginal d/c. No itching, burning or irritation. She is married to a woman, monogamous.   GYNECOLOGIC HISTORY: Patient's last menstrual period was 05/04/2018 (exact date). Contraception:None, female partner Menopausal hormone therapy: None        OB History    Gravida  0   Para  0   Term  0   Preterm  0   AB  0   Living  0     SAB  0   TAB  0   Ectopic  0   Multiple  0   Live Births  0              Patient Active Problem List   Diagnosis Date Noted  . 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 Migraine with aura and without status migrainosus, not intractable 09/11/2012  . Variants of migraine, not elsewhere classified, without mention of intractable migraine without mention of status migrainosus 09/11/2012  . Unspecified constipation 09/11/2012  . Syncope 08/31/2010  . Neck Pain 08/31/2010  . Insomnia 08/31/2010    Past Medical History:  Diagnosis Date  . Allergic rhinitis   . Asthma   . Eczema   . Environmental allergies    Age 81 years  . Generalized headaches    Began at age 89 years  . Influenza    Age 47 years  . 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 Streptococcal infection(041.00)    Age 45 and 22 years old    Past Surgical History:   Procedure Laterality Date  . TONSILLECTOMY  2012  . WISDOM TOOTH EXTRACTION      Current Outpatient Medications  Medication Sig Dispense Refill  . albuterol (PROVENTIL HFA;VENTOLIN HFA) 108 (90 Base) MCG/ACT inhaler Inhale 2 puffs into the lungs every 6 (six) hours as needed for wheezing or shortness of breath. 2 Inhaler 3  . budesonide-formoterol (SYMBICORT) 160-4.5 MCG/ACT inhaler Inhale 2 puffs into the lungs 2 (two) times daily. 10.2 g 5  . lidocaine (XYLOCAINE) 5 % ointment Apply a pea sized amount to the opening of the vagina 20 minutes prior to intercourse, wipe off prior to intercourse 30 g 0  . naproxen (NAPROSYN) 500 MG tablet Take 1 tablet (500 mg total) by mouth 2 (two) times daily. 14 tablet 0  . Omega-3 Fatty Acids (FISH OIL PO) Take by mouth every other day.    . Prenatal Vit-Fe Fumarate-FA (PRENATAL VITAMIN PO) Take by mouth.    . SUMAtriptan (IMITREX) 100 MG tablet Take 1 tablet (100 mg total) by mouth every 2 (two) hours as needed for migraine. May repeat in 2 hours if headache persists or recurs. 10 tablet 0   No current facility-administered medications for this visit.  ALLERGIES: Patient has no known allergies.  Family History  Problem Relation Age of Onset  . Migraines Paternal Grandmother   . Migraines Paternal Grandfather   . HIV Father   . Diabetes Mother   . Hypertension Mother   . Heart disease Mother        No details  . Anemia Mother   . Anemia Sister   . Migraines Paternal Aunt   . Migraines Maternal Uncle        Maternal Great Uncle  . Diabetes Unknown        Family History  . Hypertension Unknown        Family History  . Depression Unknown        Family History  . Asthma Unknown        Family History  . Allergic rhinitis Unknown        Family History    Social History   Socioeconomic History  . Marital status: Single    Spouse name: Not on file  . Number of children: Not on file  . Years of education: Not on file  . Highest  education level: Not on file  Occupational History  . Not on file  Social Needs  . Financial resource strain: Not on file  . Food insecurity:    Worry: Not on file    Inability: Not on file  . Transportation needs:    Medical: Not on file    Non-medical: Not on file  Tobacco Use  . Smoking status: Never Smoker  . Smokeless tobacco: Never Used  Substance and Sexual Activity  . Alcohol use: Yes    Alcohol/week: 1.0 - 2.0 standard drinks    Types: 1 - 2 Glasses of wine per week  . Drug use: No  . Sexual activity: Yes    Partners: Female    Birth control/protection: None    Comment: female partner  Lifestyle  . Physical activity:    Days per week: Not on file    Minutes per session: Not on file  . Stress: Not on file  Relationships  . Social connections:    Talks on phone: Not on file    Gets together: Not on file    Attends religious service: Not on file    Active member of club or organization: Not on file    Attends meetings of clubs or organizations: Not on file    Relationship status: Not on file  . Intimate partner violence:    Fear of current or ex partner: Not on file    Emotionally abused: Not on file    Physically abused: Not on file    Forced sexual activity: Not on file  Other Topics Concern  . 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     Review of Systems  Constitutional: Negative.   HENT: Negative.   Eyes: Negative.   Respiratory: Negative.   Cardiovascular: Negative.   Gastrointestinal: Positive for nausea.  Genitourinary:       Pelvic pain on left primarily  Musculoskeletal: Negative.   Skin: Negative.   Neurological: Negative.   Endo/Heme/Allergies: Negative.   Psychiatric/Behavioral: Negative.     PHYSICAL EXAMINATION:    BP 108/78 (BP Location: Right Arm, Patient Position: Sitting, Cuff Size: Normal)   Pulse 72   Temp 98.8 F (37.1 C)   Wt 126 lb 9.6 oz (57.4 kg)   LMP 05/04/2018  (Exact  Date)   BMI 27.40 kg/m     General appearance: alert, cooperative and appears stated age Abdomen: soft, tender in the LLQ and left mid abdomen. No rebound, no guarding, mildly distended, no masses,  no organomegaly  Pelvic: External genitalia:  no lesions              Urethra:  normal appearing urethra with no masses, tenderness or lesions              Bartholins and Skenes: normal                 Vagina: normal appearing vagina with a slight increase in thick, white vaginal discharge              Cervix: no lesions and no discharge, no consistent cervical motion tenderness              Bimanual Exam:  Uterus:  normal size, contour, position, consistency, mobility, non-tender              Adnexa: no masses, mildly tender on the left                Chaperone was present for exam.  Wet prep: no clue, no trich, + wbc KOH: no yeast PH: 4   ASSESSMENT Over 1 week h/o LLQ abdominal pain, getting worse. U/S last week was normal with some increased fluid around her left ovary suspicious for possibly ruptured cyst. She should be feeling better from a ruptured cyst, not worse. The pain is now up to her left mid abdomen as well. Only sexually active with her wife Mild increase in vaginal d/c, negative slides.     PLAN Genprobe sent Affirm sent Will send to the ER for further evaluation   An After Visit Summary was printed and given to the patient.

## 2018-05-27 LAB — VAGINITIS/VAGINOSIS, DNA PROBE
Candida Species: NEGATIVE
Gardnerella vaginalis: NEGATIVE
Trichomonas vaginosis: NEGATIVE

## 2018-05-27 LAB — GC/CHLAMYDIA PROBE AMP
Chlamydia trachomatis, NAA: NEGATIVE
Neisseria gonorrhoeae by PCR: NEGATIVE

## 2018-05-29 ENCOUNTER — Ambulatory Visit: Payer: BLUE CROSS/BLUE SHIELD | Admitting: Family Medicine

## 2018-06-19 ENCOUNTER — Ambulatory Visit: Payer: BLUE CROSS/BLUE SHIELD | Admitting: Obstetrics and Gynecology

## 2018-08-21 ENCOUNTER — Ambulatory Visit (INDEPENDENT_AMBULATORY_CARE_PROVIDER_SITE_OTHER): Payer: BLUE CROSS/BLUE SHIELD | Admitting: Obstetrics and Gynecology

## 2018-08-21 ENCOUNTER — Ambulatory Visit (INDEPENDENT_AMBULATORY_CARE_PROVIDER_SITE_OTHER): Payer: BLUE CROSS/BLUE SHIELD

## 2018-08-21 ENCOUNTER — Encounter: Payer: Self-pay | Admitting: Obstetrics and Gynecology

## 2018-08-21 ENCOUNTER — Other Ambulatory Visit: Payer: Self-pay

## 2018-08-21 ENCOUNTER — Telehealth: Payer: Self-pay | Admitting: Obstetrics and Gynecology

## 2018-08-21 VITALS — BP 110/74 | HR 62 | Resp 14 | Ht <= 58 in | Wt 128.1 lb

## 2018-08-21 DIAGNOSIS — R109 Unspecified abdominal pain: Secondary | ICD-10-CM

## 2018-08-21 DIAGNOSIS — R102 Pelvic and perineal pain: Secondary | ICD-10-CM

## 2018-08-21 DIAGNOSIS — N83202 Unspecified ovarian cyst, left side: Secondary | ICD-10-CM

## 2018-08-21 DIAGNOSIS — R1032 Left lower quadrant pain: Secondary | ICD-10-CM

## 2018-08-21 DIAGNOSIS — N898 Other specified noninflammatory disorders of vagina: Secondary | ICD-10-CM

## 2018-08-21 DIAGNOSIS — G43109 Migraine with aura, not intractable, without status migrainosus: Secondary | ICD-10-CM

## 2018-08-21 NOTE — Telephone Encounter (Signed)
Spoke with patient. Recurring LLQ pain.  States it is intermittent and sharp, worse with movement. Last week, pressure with voiding. No pressure today.  States she struggles with constipation but not having that now.  Sexually active with wife.  No contraception.  Office visit today with Dr. Oscar La scheduled.  Encounter closed as same day appointment.

## 2018-08-21 NOTE — Progress Notes (Signed)
GYNECOLOGY  VISIT   HPI: 23 y.o.   Single Black or African American Not Hispanic or Latino  female   G0P0000 with Patient's last menstrual period was 07/24/2018.   here for   LLQ pain and vulvar pain She c/o a 2 week h/o LLQ sharp, abdominal pain, intermittent, up to a 9/10 in severity. It lasts for 3-4 minutes at a time. Worse with movement. She had similar pain in 12/19 with a negative w/u. BM qod recently, on miralax.  Ibuprofen helps the pain some.  She has constipation, takes miralax intermittently.  She noticed vulvar pain 2 days ago with attempted sex with her wife. She feels LLQ pressure when she voids. No urinary c/o. She c/o an increase in milky white vaginal d/c, no odor.  No fever.   LMP 07/24/18, cycles have been irregular. Cycles range from q 3-6 weeks.   GYNECOLOGIC HISTORY: Patient's last menstrual period was 07/24/2018. Contraception: none Menopausal hormone therapy: none         OB History    Gravida  0   Para  0   Term  0   Preterm  0   AB  0   Living  0     SAB  0   TAB  0   Ectopic  0   Multiple  0   Live Births  0              Patient Active Problem List   Diagnosis Date Noted  . 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 Migraine with aura and without status migrainosus, not intractable 09/11/2012  . Variants of migraine, not elsewhere classified, without mention of intractable migraine without mention of status migrainosus 09/11/2012  . Unspecified constipation 09/11/2012  . Syncope 08/31/2010  . Neck Pain 08/31/2010  . Insomnia 08/31/2010    Past Medical History:  Diagnosis Date  . Allergic rhinitis   . Asthma   . Eczema   . Environmental allergies    Age 67 years  . Generalized headaches    Began at age 58 years  . Influenza    Age 23 years  . 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 Streptococcal infection(041.00)    Age 89 and 23 years old    Past Surgical History:  Procedure Laterality Date  . TONSILLECTOMY  2012   . WISDOM TOOTH EXTRACTION      Current Outpatient Medications  Medication Sig Dispense Refill  . albuterol (PROVENTIL HFA;VENTOLIN HFA) 108 (90 Base) MCG/ACT inhaler Inhale 2 puffs into the lungs every 6 (six) hours as needed for wheezing or shortness of breath. 2 Inhaler 3  . ibuprofen (ADVIL,MOTRIN) 800 MG tablet     . Omega-3 Fatty Acids (FISH OIL PO) Take by mouth every other day.    . Prenatal Vit-Fe Fumarate-FA (PRENATAL VITAMIN PO) Take by mouth.     No current facility-administered medications for this visit.      ALLERGIES: Patient has no known allergies.  Family History  Problem Relation Age of Onset  . Migraines Paternal Grandmother   . Migraines Paternal Grandfather   . HIV Father   . Diabetes Mother   . Hypertension Mother   . Heart disease Mother        No details  . Anemia Mother   . Anemia Sister   . Migraines Paternal Aunt   . Migraines Maternal Uncle        Maternal Great Uncle  . Diabetes Unknown  Family History  . Hypertension Unknown        Family History  . Depression Unknown        Family History  . Asthma Unknown        Family History  . Allergic rhinitis Unknown        Family History    Social History   Socioeconomic History  . Marital status: Single    Spouse name: Not on file  . Number of children: Not on file  . Years of education: Not on file  . Highest education level: Not on file  Occupational History  . Not on file  Social Needs  . Financial resource strain: Not on file  . Food insecurity:    Worry: Not on file    Inability: Not on file  . Transportation needs:    Medical: Not on file    Non-medical: Not on file  Tobacco Use  . Smoking status: Never Smoker  . Smokeless tobacco: Never Used  Substance and Sexual Activity  . Alcohol use: Yes    Alcohol/week: 1.0 - 2.0 standard drinks    Types: 1 - 2 Glasses of wine per week  . Drug use: No  . Sexual activity: Yes    Partners: Female    Birth control/protection:  None    Comment: female partner  Lifestyle  . Physical activity:    Days per week: Not on file    Minutes per session: Not on file  . Stress: Not on file  Relationships  . Social connections:    Talks on phone: Not on file    Gets together: Not on file    Attends religious service: Not on file    Active member of club or organization: Not on file    Attends meetings of clubs or organizations: Not on file    Relationship status: Not on file  . Intimate partner violence:    Fear of current or ex partner: Not on file    Emotionally abused: Not on file    Physically abused: Not on file    Forced sexual activity: Not on file  Other Topics Concern  . 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     Review of Systems  Constitutional: Negative.   HENT: Negative.   Eyes: Negative.   Respiratory: Negative.   Cardiovascular: Negative.   Gastrointestinal: Negative.   Genitourinary:       LLQ pain Vulvar pain    Musculoskeletal: Negative.   Skin: Negative.   Neurological: Negative.   Endo/Heme/Allergies: Negative.   Psychiatric/Behavioral: Negative.     PHYSICAL EXAMINATION:    BP 110/74 (BP Location: Right Arm, Patient Position: Sitting, Cuff Size: Normal)   Pulse 62   Resp 14   Ht 4\' 10"  (1.473 m)   Wt 128 lb 1.6 oz (58.1 kg)   LMP 07/24/2018   BMI 26.77 kg/m     General appearance: alert, cooperative and appears stated age  Abdomen: soft, non-tender; non distended, no masses,  no organomegaly  Pelvic: External genitalia:  no lesions              Urethra:  normal appearing urethra with no masses, tenderness or lesions              Bartholins and Skenes: normal                 Vagina: normal appearing vagina  with slight increase in thick, white vaginal d/c              Cervix: no cervical motion tenderness and no lesions              Bimanual Exam:  Uterus:  normal size, contour, position, consistency, mobility,  non-tender and retroverted              Adnexa: tender with some fullness on the left                 Chaperone was present for exam.  ASSESSMENT LLQ abdominal/pelvic pain Vaginal discharge    PLAN Return for ultrasound Affirm sent genprobe sent    Addendum: ultrasound performed and reviewed with the patient. She has a hemorrhagic corpus luteum on the left, some fluid in her pelvis This is second episode of similar pelvic pain since 12/19. Discussed the possibility of suppressing ovulation.  She has a h/o migraines with aura, not a candidate for OCP's Declines depo-provera for suppression of ovulation She will continue with the 800 mg Ibuprofen for pain, can take tylenol as needed.  She will keep track of cycles and pain  An After Visit Summary was printed and given to the patient.  Between both visits ~25  minutes face to face time of which over 50% was spent in counseling.

## 2018-08-21 NOTE — Telephone Encounter (Signed)
Patient states she is having pain on her lower left side. Not due for cycle for another 12 days. Has been discussed in the past that she may have an ovarian cyst. She would like to possibly have an ultrasound done.

## 2018-08-21 NOTE — Progress Notes (Signed)
GYNECOLOGY  VISIT

## 2018-08-22 LAB — VAGINITIS/VAGINOSIS, DNA PROBE
Candida Species: NEGATIVE
Gardnerella vaginalis: NEGATIVE
Trichomonas vaginosis: NEGATIVE

## 2018-08-23 LAB — GC/CHLAMYDIA PROBE AMP
Chlamydia trachomatis, NAA: NEGATIVE
Neisseria gonorrhoeae by PCR: NEGATIVE

## 2018-09-01 ENCOUNTER — Other Ambulatory Visit: Payer: Self-pay

## 2018-09-01 ENCOUNTER — Encounter: Payer: Self-pay | Admitting: Family Medicine

## 2018-09-01 ENCOUNTER — Ambulatory Visit (INDEPENDENT_AMBULATORY_CARE_PROVIDER_SITE_OTHER): Payer: BLUE CROSS/BLUE SHIELD | Admitting: Family Medicine

## 2018-09-01 VITALS — BP 108/72 | HR 95 | Temp 98.6°F | Ht <= 58 in | Wt 127.5 lb

## 2018-09-01 DIAGNOSIS — J4521 Mild intermittent asthma with (acute) exacerbation: Secondary | ICD-10-CM | POA: Insufficient documentation

## 2018-09-01 MED ORDER — BENZONATATE 100 MG PO CAPS: 100.0000 mg | ORAL_CAPSULE | Freq: Three times a day (TID) | ORAL | 0 refills | Status: DC | PRN

## 2018-09-01 MED ORDER — FLUTICASONE PROPIONATE HFA 110 MCG/ACT IN AERO: 2.0000 | INHALATION_SPRAY | Freq: Two times a day (BID) | RESPIRATORY_TRACT | 2 refills | Status: DC

## 2018-09-01 MED ORDER — IPRATROPIUM-ALBUTEROL 0.5-2.5 (3) MG/3ML IN SOLN
3.0000 mL | Freq: Four times a day (QID) | RESPIRATORY_TRACT | Status: DC
  Administered 2018-09-01: 3 mL via RESPIRATORY_TRACT

## 2018-09-01 MED ORDER — LEVOCETIRIZINE DIHYDROCHLORIDE 5 MG PO TABS: 5.0000 mg | ORAL_TABLET | Freq: Every evening | ORAL | 3 refills | Status: DC

## 2018-09-01 MED ORDER — ALBUTEROL SULFATE HFA 108 (90 BASE) MCG/ACT IN AERS: 2.0000 | INHALATION_SPRAY | Freq: Four times a day (QID) | RESPIRATORY_TRACT | 3 refills | Status: DC | PRN

## 2018-09-01 NOTE — Addendum Note (Signed)
Addended by: Scharlene Gloss B on: 09/01/2018 04:18 PM   Modules accepted: Orders

## 2018-09-01 NOTE — Patient Instructions (Signed)
Continue to push fluids, practice good hand hygiene, and cover your mouth if you cough.  If you start having fevers, shaking or shortness of breath, seek immediate care.  Rinse your mouth out after using the inhaler.  Claritin (loratadine), Allegra (fexofenadine), Zyrtec (cetirizine); these are listed in order from weakest to strongest. Generic, and therefore cheaper, options are in the parentheses.   Flonase (fluticasone); nasal spray that is over the counter. 2 sprays each nostril, once daily. Aim towards the same side eye when you spray.  There are available OTC, and the generic versions, which may be cheaper, are in parentheses. Show this to a pharmacist if you have trouble finding any of these items.  Let us know if you need anything.

## 2018-09-01 NOTE — Progress Notes (Signed)
Chief Complaint  Patient presents with  . Cough  . Shortness of Breath    one week  . Wheezing    Maria Smith here for URI complaints.  Duration: 1 month  Associated symptoms: wheezing, shortness of breath, chest tightness and cough Denies: sinus congestion, sinus pain, rhinorrhea, itchy watery eyes, ear pain, ear drainage, sore throat, fevers and myalgia Treatment to date: Albuterol, Nyquil, Mucinex, Tessalon Perles helped Sick contacts: Yes- wife  ROS:  Const: Denies fevers HEENT: As noted in HPI Lungs: + SOB  Past Medical History:  Diagnosis Date  . Allergic rhinitis   . Asthma   . Eczema   . Environmental allergies    Age 23 years  . Generalized headaches    Began at age 40 years  . Influenza    Age 69 years  . 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 Streptococcal infection(041.00)    Age 16 and 23 years old    BP 108/72 (BP Location: Right Arm, Patient Position: Sitting, Cuff Size: Normal)   Pulse 95   Temp 98.6 F (37 C) (Oral)   Ht 4\' 10"  (1.473 m)   Wt 127 lb 8 oz (57.8 kg)   SpO2 98%   BMI 26.65 kg/m  General: Awake, alert, appears stated age HEENT: AT, [place], ears patent b/l and TM's neg, nares patent w/o discharge, +edema of b/l turbinates with pale mucosa on R; pharynx pink and without exudates, MMM Neck: No masses or asymmetry Heart: RRR Lungs: CTAB, decreased air flow throughout; otherwise no accessory muscle use Psych: Age appropriate judgment and insight, normal mood and affect   Mild intermittent asthma with (acute) exacerbation - Plan: albuterol (PROVENTIL HFA;VENTOLIN HFA) 108 (90 Base) MCG/ACT inhaler, benzonatate (TESSALON) 100 MG capsule, fluticasone (FLOVENT HFA) 110 MCG/ACT inhaler  This seems to be her asthma. Orders as above. Add ICS for next 2 weeks. Neb tx today. Refill SABA. Continue to push fluids, practice good hand hygiene, cover mouth when coughing. F/u prn. If starting to experience fevers, shaking, or  shortness of breath, seek immediate care. Pt voiced understanding and agreement to the plan.  Jilda Roche Churdan, DO 09/01/18 4:07 PM

## 2018-09-04 ENCOUNTER — Encounter: Payer: Self-pay | Admitting: Family Medicine

## 2018-09-04 ENCOUNTER — Telehealth: Payer: Self-pay

## 2018-09-04 NOTE — Telephone Encounter (Signed)
Copied from CRM [id number]. Topic: General - Inquiry >> Sep 03, 2018  4:52 PM Terisa Starr wrote: Reason for CRM: patient would like a note for work stating she is okay to come back to work and does not have COVID-19. She was seen 3/16. Please upload to my chart. Thanks!

## 2018-09-04 NOTE — Telephone Encounter (Signed)
We can't say 100% she doesn't have it and we can't test people unless they meet certain criteria due to availability of testing. We can say, based on our visit on 3/16, it is highly unlikely she has Covid. TY.

## 2018-09-04 NOTE — Telephone Encounter (Signed)
Letter done and sent through mychart---the patient informed

## 2018-09-08 ENCOUNTER — Ambulatory Visit: Payer: Self-pay | Admitting: *Deleted

## 2018-09-08 NOTE — Telephone Encounter (Signed)
Seen by Dr. Carmelia Roller on 3/16 with mild asthma exacerbation. Cough and chest tightness resolved. No fever. Has a discomfort in her throat-burning"maybe". No drooling no difficulty chewing or swallowing. No difficulty breathing. Reviewed sore throat advice with patient. Reviewed urgent symptoms she would need to seek immediate evaluation of.   Reason for Disposition . [1] Sore throat with cough/cold symptoms AND [2] present < 5 days  Answer Assessment - Initial Assessment Questions 1. ONSET: "When did the throat start hurting?" (Hours or days ago)      Burning sensation in her throat about 2 weeks ago. 2. SEVERITY: "How bad is the sore throat?" (Scale 1-10; mild, moderate or severe)   - MILD (1-3):  doesn't interfere with eating or normal activities   - MODERATE (4-7): interferes with eating some solids and normal activities   - SEVERE (8-10):  excruciating pain, interferes with most normal activities   - SEVERE DYSPHAGIA: can't swallow liquids, drooling     Rates an 8 but goes to work. 3. STREP EXPOSURE: "Has there been any exposure to strep within the past week?" If so, ask: "What type of contact occurred?"      Not aware of any 4.  VIRAL SYMPTOMS: "Are there any symptoms of a cold, such as a runny nose, cough, hoarse voice or red eyes?"      no 5. FEVER: "Do you have a fever?" If so, ask: "What is your temperature, how was it measured, and when did it start?"    no 6. PUS ON THE TONSILS: "Is there pus on the tonsils in the back of your throat?"     no 7. OTHER SYMPTOMS: "Do you have any other symptoms?" (e.g., difficulty breathing, headache, rash)    Denies all 8. PREGNANCY: "Is there any chance you are pregnant?" "When was your last menstrual period?"     no  Protocols used: SORE THROAT-A-AH

## 2018-09-09 ENCOUNTER — Telehealth: Payer: Self-pay

## 2018-09-09 NOTE — Telephone Encounter (Signed)
Please advise 

## 2018-09-09 NOTE — Telephone Encounter (Signed)
Pt called for an update. Pt just wanted to know if she will be contacted today.

## 2018-09-09 NOTE — Telephone Encounter (Signed)
Copied from CRM [id number]. Topic: General - Other >> Sep 09, 2018  1:34 PM Mcneil, Ja-Kwan wrote: Reason for CRM: Pt stated she has asthma and she has done the breathing treatment but she is still having yellow mucus and some chest irritation. Pt also stated that her concern is with her asthma due to chest feeling tight and she would like to know what she should do about coming in. Called office for scheduling and I was asked to send a message.

## 2018-09-10 ENCOUNTER — Ambulatory Visit: Payer: Self-pay | Admitting: Family Medicine

## 2018-09-10 ENCOUNTER — Encounter (HOSPITAL_COMMUNITY): Payer: Self-pay | Admitting: Emergency Medicine

## 2018-09-10 ENCOUNTER — Other Ambulatory Visit: Payer: Self-pay

## 2018-09-10 ENCOUNTER — Ambulatory Visit (HOSPITAL_COMMUNITY)
Admission: EM | Admit: 2018-09-10 | Discharge: 2018-09-10 | Disposition: A | Discharge status: Home / Self Care | Payer: BLUE CROSS/BLUE SHIELD | Attending: Family Medicine | Admitting: Family Medicine

## 2018-09-10 DIAGNOSIS — R070 Pain in throat: Secondary | ICD-10-CM

## 2018-09-10 DIAGNOSIS — R062 Wheezing: Secondary | ICD-10-CM

## 2018-09-10 MED ORDER — PREDNISONE 20 MG PO TABS: 40.0000 mg | ORAL_TABLET | Freq: Every day | ORAL | 0 refills | Status: DC

## 2018-09-10 NOTE — Telephone Encounter (Signed)
Let's do a short course of prednisone. Ty.

## 2018-09-10 NOTE — Telephone Encounter (Signed)
Please advise 

## 2018-09-10 NOTE — Telephone Encounter (Signed)
Patient informed prescription sent in

## 2018-09-10 NOTE — Discharge Instructions (Signed)
Start taking the prednisone prescribed by your doctor as well as any over the counter medicines you wish.  You may use over the counter ibuprofen or acetaminophen as needed.  For a sore throat, over the counter products such as [employer] Peroxyl Mouth Sore Rinse or Chloraseptic Sore Throat Spray may provide some temporary relief.

## 2018-09-10 NOTE — ED Triage Notes (Signed)
Saw pcp last Tuesday_tessalon pearles, flovent and loratidine.  And instructed to take mucinex as well.  Talked to office nurse today and was prescribed prednisone.    Throat irritation burning sensation at top of chest and bottom of throat.  Patient has mucus in throat.  Denies runny nose Denies cough"unless I want to cough" Slight sob "breating harder" than usual No fever.

## 2018-09-10 NOTE — ED Provider Notes (Signed)
[facility]   [id number] 09/10/18 Arrival Time: 1215  ASSESSMENT & PLAN:  1. Burning sensation of throat   2. Wheezing    Symptoms may be related to persistent post-nasal drainage. No signs of bacterial infection. No indication for chest imaging at this time. Discussed.   Discharge Instructions     Start taking the prednisone prescribed by your doctor as well as any over the counter medicines you wish.   You may use over the counter ibuprofen or acetaminophen as needed.   For a sore throat, over the counter products such as [employer] Peroxyl Mouth Sore Rinse or Chloraseptic Sore Throat Spray may provide some temporary relief.      Asthma precautions given. OTC symptom care as needed.  Follow-up Information    Sharlene Dory, DO.   Specialty:  Family Medicine Why:  As needed. Contact information: [address] [place] [address] [phone]           Reviewed expectations re: course of current medical issues. Questions answered. Outlined signs and symptoms indicating need for more acute intervention. Patient verbalized understanding. After Visit Summary given.  SUBJECTIVE: History from: patient.  Maria Smith is a 23 y.o. female who presents with complaint of persistent throat irritation and occasional wheezing. Seen by PCP last week; given Tessalon Perles, Flovent, and Claritin. Has been using without much relief. "Just feels like there is a burning in my throat." Afebrile. Occasional wheezing, esp with coughing. Occasionally coughs up "thick mucous". No CP or SOB reported. Ambulatory without difficulty. Overall normal PO intake without n/v. Sick contacts: none known. Typically her asthma is well controlled. Inhaler use: as needed; "not sure it's helping my throat". OTC treatment: Mucinex; questions mild help.   Social History   Tobacco Use  Smoking Status Never Smoker  Smokeless Tobacco Never Used     ROS: As per HPI. All other systems negative.   OBJECTIVE:  Vitals:   09/10/18 1248  BP: 112/83  Pulse: 70  Resp: 16  Temp: 98.5 F (36.9 C)  TempSrc: Oral  SpO2: 97%     General appearance: alert; appears fatigued HEENT: nasal congestion; clear runny nose; throat irritation secondary to post-nasal drainage; oropharynx is moist; no sign of thrush Neck: supple without LAD Cv: RRR without murmer Lungs: unlabored respirations, without expiratory wheezing; cough: mild; no significant respiratory distress Skin: warm and dry Psychological: alert and cooperative; normal mood and affect   No Known Allergies  Past Medical History:  Diagnosis Date  . Allergic rhinitis   . Asthma   . Eczema   . Environmental allergies    Age 27 years  . Generalized headaches    Began at age 57 years  . Influenza    Age 70 years  . 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 Streptococcal infection(041.00)    Age 23 and 23 years old   Family History  Problem Relation Age of Onset  . Migraines Paternal Grandmother   . Migraines Paternal Grandfather   . HIV Father   . Diabetes Mother   . Hypertension Mother   . Heart disease Mother        No details  . Anemia Mother   . Anemia Sister   . Migraines Paternal Aunt   . Migraines Maternal Uncle        Maternal Great Uncle  . Diabetes Other        Family History  . Hypertension  Other        Family History  . Depression Other        Family History  . Asthma Other        Family History  . Allergic rhinitis Other        Family History   Social History   Socioeconomic History  . Marital status: Single    Spouse name: Not on file  . Number of children: Not on file  . Years of education: Not on file  . Highest education level: Not on file  Occupational History  . Not on file  Social Needs  . Financial resource strain: Not on file  . Food insecurity:    Worry: Not on file    Inability: Not on file  . Transportation  needs:    Medical: Not on file    Non-medical: Not on file  Tobacco Use  . Smoking status: Never Smoker  . Smokeless tobacco: Never Used  Substance and Sexual Activity  . Alcohol use: Yes    Alcohol/week: 1.0 - 2.0 standard drinks    Types: 1 - 2 Glasses of wine per week  . Drug use: No  . Sexual activity: Yes    Partners: Female    Birth control/protection: None    Comment: female partner  Lifestyle  . Physical activity:    Days per week: Not on file    Minutes per session: Not on file  . Stress: Not on file  Relationships  . Social connections:    Talks on phone: Not on file    Gets together: Not on file    Attends religious service: Not on file    Active member of club or organization: Not on file    Attends meetings of clubs or organizations: Not on file    Relationship status: Not on file  . Intimate partner violence:    Fear of current or ex partner: Not on file    Emotionally abused: Not on file    Physically abused: Not on file    Forced sexual activity: Not on file  Other Topics Concern  . 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             Mardella Layman, MD 09/18/18 [phone]

## 2018-09-10 NOTE — Telephone Encounter (Signed)
Pt called in c/o her chest feeling tight and burning.   She has asthma and was seen by Dr. Carmelia Roller on 09/01/2018.   She does not feel she is getting better.  See triage notes.  I verified her email and phone number.  Both are correct.   I let her know Dr. Carmelia Roller may do a televisit with her.  Someone will be contacting her.  I have sent a high priority note to Dr. Hollie Beach office for further disposition.    Reason for Disposition . [1] MILD difficulty breathing (e.g., minimal/no SOB at rest, SOB with walking, pulse <100) AND [2] NEW-onset or WORSE than normal  Answer Assessment - Initial Assessment Questions 1. RESPIRATORY STATUS: "Describe your breathing?" (e.g., wheezing, shortness of breath, unable to speak, severe coughing)      I have asthma.   I saw Dr. Carmelia Roller and he said it was my asthma.  I got a breathing treatment.    My chest feels tight and burning.   My asthma doesn't last this long. 2. ONSET: "When did this breathing problem begin?"      Started last Wed burning and tightness in my chest. 3. PATTERN "Does the difficult breathing come and go, or has it been constant since it started?"      Ist's constant.   I'm using my mom's breathing machine.  I'm using the medications that were prescribed.    It's yellow mucus.   4. SEVERITY: "How bad is your breathing?" (e.g., mild, moderate, severe)    - MILD: No SOB at rest, mild SOB with walking, speaks normally in sentences, can lay down, no retractions, pulse < 100.    - MODERATE: SOB at rest, SOB with minimal exertion and prefers to sit, cannot lie down flat, speaks in phrases, mild retractions, audible wheezing, pulse 100-120.    - SEVERE: Very SOB at rest, speaks in single words, struggling to breathe, sitting hunched forward, retractions, pulse > 120      A little short of breath but not all the time.  I use my inhaler and it helps some. 5. RECURRENT SYMPTOM: "Have you had difficulty breathing before?" If so, ask: "When was the  last time?" and "What happened that time?"      Not like this with the tightness and burning. 6. CARDIAC HISTORY: "Do you have any history of heart disease?" (e.g., heart attack, angina, bypass surgery, angioplasty)      No.    7. LUNG HISTORY: "Do you have any history of lung disease?"  (e.g., pulmonary embolus, asthma, emphysema)     Asthma.   Not a smoker. 8. CAUSE: "What do you think is causing the breathing problem?"      Maybe my asthma. 9. OTHER SYMPTOMS: "Do you have any other symptoms? (e.g., dizziness, runny nose, cough, chest pain, fever)     Chest pain.   No fever I've been checking it.   I'm burning at the bottom of my throat.   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   We had suspected person they tested but no results yet.   I did not have contact with her. 10. PREGNANCY: "Is there any chance you are pregnant?" "When was your last menstrual period?"       No.   Recent period. 11. TRAVEL: "Have you traveled out of the country in the last month?" (e.g., travel history, exposures)       No travels at all.  Protocols used: BREATHING DIFFICULTY-A-AH

## 2018-09-11 ENCOUNTER — Ambulatory Visit: Payer: Self-pay

## 2018-09-11 NOTE — Telephone Encounter (Signed)
Phone call returned to pt.  Reported she started on Prednisone today, as prescribed for Asthma, from UC visit 3/25.  Reported she took 1st dose this morning at about 5:00 AM, prior to work.  Reported about 1:00 PM she felt her heart was racing, while at work.  After work, she checked BP 120/79, and Pulse 93 2 approx.Marland Kitchen 3:50 PM.  Denied any chest pain, dizziness, or shortness of breath.  Reported "I feel alright, but I know that 55 is an elevated heart rate for me."  Pt rechecked vital signs at 4:25 PM; BP 119/79, and Pulse 87.  Advised that Prednisone can cause an elevated heart rate.  Advised of other more common side effects of Prednisone.  Advised to continue to monitor her BP and pulse.  Advised that normal pulse rate can be 60-90"s, depending on her activity.  Encouraged to report pulse rate that consistently stays in upper 90's to above 100.  Also advised to call if has any associated symptoms of  dizziness, chest pain, or shortness of breath, in addition to increased heart rate. Verb. Understanding.  Will send triage note to PCP, to make aware.  Agreed with plan.        Reason for Disposition . Caller has NON-URGENT medication question about med that PCP prescribed and triager unable to answer question  Answer Assessment - Initial Assessment Questions 1. SYMPTOMS: "Do you have any symptoms?"     Felt like her heart was racing about 1:00 PM;  Checked BP 120/79; pulse 93 taken at approx. 3:50 PM;  Denied shortness of breath, dizziness, or chest discomfort.  Feels that her Asthma symptoms are better.  2. SEVERITY: If symptoms are present, ask "Are they mild, moderate or severe?"     *No Answer*  Protocols used: MEDICATION QUESTION CALL-A-AH  Message from Tonita Phoenix sent at 09/11/2018 4:00 PM EDT   Summary: Medication Reaction   Pt stated that she took Prednisone this morning and her hear rate felt like it had elevated. Pt took her BP and heart rate on home cuff and they were not elevated  however she would still like to speak with a nurse regarding how she was feeling. Please advise.

## 2018-09-12 MED ORDER — METHYLPREDNISOLONE 4 MG PO TBPK: ORAL_TABLET | ORAL | 0 refills | Status: DC

## 2018-09-12 NOTE — Telephone Encounter (Signed)
Let's do a medrol dospak then. Will send in, stop pred. Ty.

## 2018-09-12 NOTE — Addendum Note (Signed)
Addended by: Radene Gunning on: 09/12/2018 01:23 PM   Modules accepted: Orders

## 2018-09-12 NOTE — Telephone Encounter (Signed)
Called the patient and she did say the prednisone did help her breathing. She has no had today and noticed her heart rate increased some but long after taking the prednisone and she was at work as well. She is ok to send in something else

## 2018-09-12 NOTE — Telephone Encounter (Signed)
Did it help her breathing? I would table the prednisone for now and if it helped, consider a different medicine to help her breathing.

## 2018-09-12 NOTE — Telephone Encounter (Signed)
Called informed of PCP instructions. 

## 2018-09-12 NOTE — Telephone Encounter (Signed)
Pt. Calling back in regard to the Prednisone. Heart rate has come down to 70. Continues to hold her Prednisone. States "I'm just waiting to hear back from Dr. Carmelia Roller." Please advise pt.

## 2018-09-12 NOTE — Telephone Encounter (Signed)
Called left message to call back 

## 2018-09-14 ENCOUNTER — Ambulatory Visit (HOSPITAL_COMMUNITY)
Admission: EM | Admit: 2018-09-14 | Discharge: 2018-09-14 | Disposition: A | Discharge status: Home / Self Care | Payer: BLUE CROSS/BLUE SHIELD | Attending: Family Medicine | Admitting: Family Medicine

## 2018-09-14 ENCOUNTER — Other Ambulatory Visit: Payer: Self-pay

## 2018-09-14 ENCOUNTER — Encounter (HOSPITAL_COMMUNITY): Payer: Self-pay | Admitting: *Deleted

## 2018-09-14 DIAGNOSIS — R0789 Other chest pain: Secondary | ICD-10-CM | POA: Diagnosis not present

## 2018-09-14 MED ORDER — NAPROXEN 500 MG PO TABS: 500.0000 mg | ORAL_TABLET | Freq: Two times a day (BID) | ORAL | 0 refills | Status: DC

## 2018-09-14 MED ORDER — CETIRIZINE HCL 10 MG PO CAPS: 10.0000 mg | ORAL_CAPSULE | Freq: Every day | ORAL | 0 refills | Status: DC

## 2018-09-14 NOTE — ED Triage Notes (Signed)
Saw PCP last week for asthma & was given Prednisone; after one dose, HR increased, so was told to discontinue use.  Spoke with PCP 2 days ago - started on methylprednisolone today - took 3 doses today; now c/o having intermittent chest pains.  Denies SOB or any other c/o's.

## 2018-09-14 NOTE — ED Provider Notes (Addendum)
[facility]    CSN: [id number] Arrival date & time: 09/14/18  1616     History   Chief Complaint Chief Complaint  Patient presents with  . Chest Pain    HPI Maria Smith is a 23 y.o. female history of allergic rhinitis, asthma, eczema, presenting today for evaluation of chest pain.  Patient states that earlier today after lunch she started to develop a chest discomfort in the left side of her chest.  Described as sharp.  Symptoms last for approximately 1 hour and have eased off.  She still has a slight dull discomfort in her chest.  Patient does note that earlier in the week she began a prednisone course to help with her asthma, she took 1 dose which caused her heart rate to go up and was switched to methylprednisolone.  She began this Medrol Dosepak today and after the second dose she believes this triggered her symptoms.  She states initially she was started on the prednisone as she was having some mild wheezing as well as a burning sensation in her chest.  She denies currently use of antihistamines.  Denies rhinorrhea, sore throat or cough.  Denies any fevers.  Overall her symptoms have improved.  Denies history of hypertension, diabetes, smoking.  Denies family history of death from an early age of an MI.  HPI  Past Medical History:  Diagnosis Date  . Allergic rhinitis   . Asthma   . Eczema   . Environmental allergies    Age 26 years  . Generalized headaches    Began at age 60 years  . Influenza    Age 8 years  . 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 Streptococcal infection(041.00)    Age 68 and 23 years old    Patient Active Problem List   Diagnosis Date Noted  . Mild intermittent asthma with (acute) exacerbation 09/01/2018  . 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 Migraine with aura and without status migrainosus, not intractable 09/11/2012  . Variants of migraine, not elsewhere classified, without mention of intractable migraine without  mention of status migrainosus 09/11/2012  . Unspecified constipation 09/11/2012  . Syncope 08/31/2010  . Neck Pain 08/31/2010  . Insomnia 08/31/2010    Past Surgical History:  Procedure Laterality Date  . TONSILLECTOMY  2012  . WISDOM TOOTH EXTRACTION      OB History    Gravida  0   Para  0   Term  0   Preterm  0   AB  0   Living  0     SAB  0   TAB  0   Ectopic  0   Multiple  0   Live Births  0            Home Medications    Prior to Admission medications   Medication Sig Start Date End Date Taking? Authorizing Provider  fluticasone (FLOVENT HFA) 110 MCG/ACT inhaler Inhale 2 puffs into the lungs 2 (two) times daily. 09/01/18  Yes Sharlene Dory, DO  methylPREDNISolone (MEDROL DOSEPAK) 4 MG TBPK tablet Follow instructions on package. 09/12/18  Yes Sharlene Dory, DO  albuterol (PROVENTIL HFA;VENTOLIN HFA) 108 (90 Base) MCG/ACT inhaler Inhale 2 puffs into the lungs every 6 (six) hours as needed for wheezing or shortness of breath. 09/01/18   Sharlene Dory, DO  benzonatate (TESSALON) 100 MG capsule Take 1 capsule (100 mg total) by mouth 3 (three) times daily as needed.  09/01/18   Sharlene Dory, DO  Cetirizine HCl 10 MG CAPS Take 1 capsule (10 mg total) by mouth daily for 10 days. 09/14/18 09/24/18  Wieters, Hallie C, PA-C  ibuprofen (ADVIL,MOTRIN) 800 MG tablet  05/25/18   [provider]  Ipratropium-Albuterol (DUONEB IN) Inhale into the lungs.    [provider]  levocetirizine (XYZAL) 5 MG tablet Take 1 tablet (5 mg total) by mouth every evening. 09/01/18   Sharlene Dory, DO  naproxen (NAPROSYN) 500 MG tablet Take 1 tablet (500 mg total) by mouth 2 (two) times daily. 09/14/18   Wieters, Hallie C, PA-C  Omega-3 Fatty Acids (FISH OIL PO) Take by mouth every other day.    [provider]  Prenatal Vit-Fe Fumarate-FA (PRENATAL VITAMIN PO) Take by mouth.    [provider]    Family  History Family History  Problem Relation Age of Onset  . Migraines Paternal Grandmother   . Migraines Paternal Grandfather   . HIV Father   . Diabetes Mother   . Hypertension Mother   . Heart disease Mother        No details  . Anemia Mother   . Anemia Sister   . Migraines Paternal Aunt   . Migraines Maternal Uncle        Maternal Great Uncle  . Diabetes Other        Family History  . Hypertension Other        Family History  . Depression Other        Family History  . Asthma Other        Family History  . Allergic rhinitis Other        Family History    Social History Social History   Tobacco Use  . Smoking status: Never Smoker  . Smokeless tobacco: Never Used  Substance Use Topics  . Alcohol use: Yes    Comment: occasionally  . Drug use: No     Allergies   Patient has no known allergies.   Review of Systems Review of Systems  Constitutional: Negative for activity change, appetite change, chills, fatigue and fever.  HENT: Negative for congestion, ear pain, rhinorrhea, sinus pressure, sore throat and trouble swallowing.   Eyes: Negative for discharge and redness.  Respiratory: Negative for cough, chest tightness and shortness of breath.   Cardiovascular: Positive for chest pain.  Gastrointestinal: Negative for abdominal pain, diarrhea, nausea and vomiting.  Musculoskeletal: Negative for myalgias.  Skin: Negative for rash.  Neurological: Negative for dizziness, light-headedness and headaches.      Physical Exam Triage Vital Signs ED Triage Vitals  Enc Vitals Group     BP 09/14/18 1641 121/70     Pulse Rate 09/14/18 1641 70     Resp 09/14/18 1641 16     Temp 09/14/18 1641 98.5 F (36.9 C)     Temp Source 09/14/18 1641 Oral     SpO2 09/14/18 1641 100 %     Weight --      Height --      Head Circumference --      Peak Flow --      Pain Score 09/14/18 1642 9     Pain Loc --      Pain Edu? --      Excl. in GC? --    No data found.  Updated  Vital Signs BP 121/70   Pulse 70   Temp 98.5 F (36.9 C) (Oral)   Resp 16  LMP 09/02/2018 (Exact Date)   SpO2 100%   Visual Acuity Right Eye Distance:   Left Eye Distance:   Bilateral Distance:    Right Eye Near:   Left Eye Near:    Bilateral Near:     Physical Exam Vitals signs and nursing note reviewed.  Constitutional:      General: She is not in acute distress.    Appearance: She is well-developed.  HENT:     Head: Normocephalic and atraumatic.     Ears:     Comments: Bilateral ears without tenderness to palpation of external auricle, tragus and mastoid, EAC's without erythema or swelling, TM's with good bony landmarks and cone of light. Non erythematous.    Mouth/Throat:     Comments: Oral mucosa pink and moist, no tonsillar enlargement or exudate. Posterior pharynx patent and nonerythematous, no uvula deviation or swelling. Normal phonation. Eyes:     Conjunctiva/sclera: Conjunctivae normal.  Neck:     Musculoskeletal: Neck supple.  Cardiovascular:     Rate and Rhythm: Normal rate and regular rhythm.     Heart sounds: No murmur.  Pulmonary:     Effort: Pulmonary effort is normal. No respiratory distress.     Breath sounds: Normal breath sounds.     Comments: Breathing comfortably at rest, CTABL, no wheezing, rales or other adventitious sounds auscultated  Diffuse anterior chest tenderness to left side of chest as well as along the left parasternal area Abdominal:     Palpations: Abdomen is soft.     Tenderness: There is no abdominal tenderness.     Comments: Nontender to light and deep palpation throughout abdomen  Skin:    General: Skin is warm and dry.  Neurological:     Mental Status: She is alert.      UC Treatments / Results  Labs (all labs ordered are listed, but only abnormal results are displayed) Labs Reviewed - No data to display  EKG None  Radiology No results found.  Procedures Procedures (including critical care time)   Medications Ordered in UC Medications - No data to display  Initial Impression / Assessment and Plan / UC Course  I have reviewed the triage vital signs and the nursing notes.  Pertinent labs & imaging results that were available during my care of the patient were reviewed by me and considered in my medical decision making (see chart for details).    EKG normal sinus rhythm with sinus arrhythmia, no acute signs of ischemia or infarction. Patient likely with adverse reaction related to methylprednisolone.  Lungs clear at this time, vital signs stable, lacking URI symptoms.  Discussed that she may continue methylprednisolone, but cut the dose in half as she may be more sensitive to steroids.  Discussed instead of doing [phone] to take 3 3, 2 2, 1 1.  If still having symptoms with this lower dose to stop and begin using Flovent again with nebulizers as her wheezing and breathing is cleared up.  Burning sensation in her throat/chest may be more related to postnasal drainage and recommended starting daily allergy medicine.  Previously prescribed Xyzal which she has not started, advised that she may try this or Zyrtec which I provided her.  May have some chest wall, muscular component contributing to chest discomfort given discomfort on exam.  Advised that she may start taking NSAIDs after completion of steroids, provided Naprosyn for this.  Advised not taking together to avoid further GI upset.  Advised chest discomfort could also be related  to increased acid/reflux from steroid use.  Continue to monitor, Discussed strict return precautions. Patient verbalized understanding and is agreeable with plan.  Final Clinical Impressions(s) / UC Diagnoses   Final diagnoses:  Atypical chest pain     Discharge Instructions     Please cut steroid dose in 1/2 If still causing symptoms- please stop Restart flovent after completion Continue DuoNeb breathing treatments as needed, albuterol inhaler as needed  Please begin daily allergy pill, may try Xyzal given by Dr. Carmelia Roller or daily cetirizine-take consistently over the next 1 to 2 weeks to help with any drainage  After completion of prednisone you may try Naprosyn or ibuprofen with food on your stomach to help with any formation in the muscles of the chest wall  Please continue to monitor, follow-up if symptoms worsening or changing    ED Prescriptions    Medication Sig Dispense Auth. Provider   Cetirizine HCl 10 MG CAPS Take 1 capsule (10 mg total) by mouth daily for 10 days. 10 capsule Wieters, Hallie C, PA-C   naproxen (NAPROSYN) 500 MG tablet Take 1 tablet (500 mg total) by mouth 2 (two) times daily. 30 tablet Wieters, McAllister C, PA-C     Controlled Substance Prescriptions [place] Controlled Substance Registry consulted? Not Applicable   Lew Dawes, PA-C 09/14/18 [address] [place], [place] 09/14/18 1734

## 2018-09-14 NOTE — Discharge Instructions (Signed)
Please cut steroid dose in 1/2 If still causing symptoms- please stop Restart flovent after completion Continue DuoNeb breathing treatments as needed, albuterol inhaler as needed Please begin daily allergy pill, may try Xyzal given by Dr. Carmelia Roller or daily cetirizine-take consistently over the next 1 to 2 weeks to help with any drainage  After completion of prednisone you may try Naprosyn or ibuprofen with food on your stomach to help with any formation in the muscles of the chest wall  Please continue to monitor, follow-up if symptoms worsening or changing

## 2018-09-15 ENCOUNTER — Ambulatory Visit: Payer: Self-pay

## 2018-09-15 ENCOUNTER — Telehealth: Payer: Self-pay | Admitting: Family Medicine

## 2018-09-15 NOTE — Telephone Encounter (Addendum)
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 She says she has been on the phone today with Zella Ball in the office about her coughing and tightness in the chest. She says she did not know this when they spoke. She denies fever at this time. She says the last time she was in contact with the patient was on 09/02/18 and she would go in her room to take her meal orders, then take the meals back to her. She says the patient was coughing prior to them requiring them to stay in their rooms and not come to the dining room. She says she has been in contact with the CNA's who have been in the residents room. She says her boss at the facility hasn't called to let her know. She says she's working at [employer] now and wearing a mask. I advised I will send this over to Dr. Carmelia Roller and someone will call with his recommendation. I advised self isolation per care advise, patient verbalized understanding.  Reason for Disposition . [1] Cough occurs AND [2] within 14 days of COVID-19 EXPOSURE  Answer Assessment - Initial Assessment Questions 1. CONFIRMED CASE: "Who is the person with the confirmed COVID-19 infection that you were exposed to?"     Resident at the facility where I work 2. PLACE of CONTACT: "Where were you when you were exposed to COVID-19  (coronavirus disease 2019)?" (e.g., city, state, country)     [employer], [place], [place] 3. TYPE of CONTACT: "How much contact was there?" (e.g., live in same house, work in same office, same school)     Dining room serving patients, but past 2 weeks she's been in her room 4. DATE of CONTACT: "When did you have contact with a coronavirus patient?" (e.g., days)     09/02/18 5. DURATION of CONTACT: "How long were you in contact with the COVID-19 (coronavirus disease) patient?" (e.g., a few seconds, passed by person, a few minutes, live with the patient)     Few  minutes, took her order then came back and brought her lunch and dinner to her room 6. SYMPTOMS: "Do you have any symptoms?" (e.g., fever, cough, breathing difficulty)     Little cough, tickle in throat, chest tightness 7. PREGNANCY OR POSTPARTUM: "Is there any chance you are pregnant?" "When was your last menstrual period?" "Did you deliver in the last 2 weeks?"     No, LMP 08/1718 8. HIGH RISK: "Do you have any heart or lung problems? Do you have a weakened immune system?" (e.g., CHF, COPD, asthma, HIV positive, chemotherapy, renal failure, diabetes mellitus, sickle cell anemia)    Asthma  Protocols used: CORONAVIRUS (COVID-19) EXPOSURE-A-AH

## 2018-09-15 NOTE — Telephone Encounter (Signed)
Called informed the patient of PCP instructions. UC called her in claritin or do you prefer what you sent in or both??

## 2018-09-15 NOTE — Telephone Encounter (Signed)
Patient informed of PCP instructions. 

## 2018-09-15 NOTE — Telephone Encounter (Signed)
To my knowledge, we aren't testing (due to lack of testing kits). Self isolation would be recommended if this is true. Go to ED, call ahead if so, if having worsening sob. Ty.

## 2018-09-15 NOTE — Telephone Encounter (Signed)
Called informed the patient of PCP instructions. She did verbalized understanding

## 2018-09-15 NOTE — Telephone Encounter (Signed)
Copied from CRM [id number]. Topic: Quick Communication - See Telephone Encounter >> Sep 15, 2018  8:55 AM Jolayne Haines L wrote: CRM for notification. See Telephone encounter for: 09/15/18. Patient states she had a reaction to her 2nd steroid methylPREDNISolone (MEDROL DOSEPAK) 4 MG TBPK tablet. She started having chest pain yesterday after her second dosage so she went to urgent care. She said her heart checked out okay but her blood pressure was elevated. She said she is suppose to take 6 pills a day of this. She was advised to try it again after she got home since her heart checked out okay. After she took the 4th dose her blood pressure was 149/80 around 8pm. She said she has stopped the medication and would like Dr Carmelia Roller or his nurse to call her

## 2018-09-15 NOTE — Telephone Encounter (Signed)
She can try it, as everyone is different. Claritin is the weakest of the anti-allergy medications. Ty.

## 2018-09-15 NOTE — Telephone Encounter (Signed)
If she is feeling better, no need to do anything. If not, we need to do neb treatments every 4 hours regardless of how she is feeling for the next 5 days. Also, I would like her to continue the Flovent 2 puffs twice daily.

## 2018-09-16 ENCOUNTER — Telehealth: Payer: Self-pay | Admitting: Family Medicine

## 2018-09-16 NOTE — Telephone Encounter (Signed)
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 She would like letter sent through [place].

## 2018-09-16 NOTE — Telephone Encounter (Signed)
We could write a blanket statement along the lines of "pt has asthma and is at higher risk of suffering complications from illnesses that affect the lungs". I can't take her out of work for that or mandate what patients her job requires her to see though.

## 2018-09-16 NOTE — Telephone Encounter (Signed)
Copied from CRM [id number]. Topic: Quick Communication - See Telephone Encounter >> Sep 16, 2018  8:32 AM Louie Bun, Rosey Bath D wrote: CRM for notification. See Telephone encounter for: 09/16/18. Patient is calling wanting to talk to Dr. Carmelia Roller or his CMA regarding a note she needs for her job. Patient stated that she has been on contact with people around a patient that was positive. And since she has ashma she would like a note stating that. Please call patient back, thanks.

## 2018-09-17 ENCOUNTER — Other Ambulatory Visit: Payer: Self-pay

## 2018-09-17 ENCOUNTER — Ambulatory Visit (HOSPITAL_COMMUNITY)
Admission: EM | Admit: 2018-09-17 | Discharge: 2018-09-17 | Disposition: A | Discharge status: Home / Self Care | Payer: BLUE CROSS/BLUE SHIELD | Attending: Family Medicine | Admitting: Family Medicine

## 2018-09-17 ENCOUNTER — Ambulatory Visit: Payer: Self-pay | Admitting: Family Medicine

## 2018-09-17 ENCOUNTER — Encounter (HOSPITAL_COMMUNITY): Payer: Self-pay

## 2018-09-17 ENCOUNTER — Encounter: Payer: Self-pay | Admitting: Family Medicine

## 2018-09-17 DIAGNOSIS — R062 Wheezing: Secondary | ICD-10-CM | POA: Diagnosis not present

## 2018-09-17 DIAGNOSIS — J029 Acute pharyngitis, unspecified: Secondary | ICD-10-CM

## 2018-09-17 MED ORDER — DEXAMETHASONE SODIUM PHOSPHATE 10 MG/ML IJ SOLN
10.0000 mg | Freq: Once | INTRAMUSCULAR | Status: AC
  Administered 2018-09-17: 10 mg via INTRAMUSCULAR

## 2018-09-17 MED ORDER — DEXAMETHASONE SODIUM PHOSPHATE 10 MG/ML IJ SOLN
INTRAMUSCULAR | Status: AC
  Filled 2018-09-17: qty 1

## 2018-09-17 NOTE — ED Notes (Signed)
Patient verbalizes understanding of discharge instructions. Opportunity for questioning and answers were provided. Patient discharged from [facility] by RN.  

## 2018-09-17 NOTE — Telephone Encounter (Signed)
See my other telephone encounter for writing a letter. TY.

## 2018-09-17 NOTE — Telephone Encounter (Signed)
Letter completed and sent through mychart. Patient made aware.(left detailed message)

## 2018-09-17 NOTE — ED Triage Notes (Signed)
Patient presents to Urgent Care with complaints of sore throat since this morning. Patient states it feels like her throat is burning, states it looks like a red rash with some white spots that is creeping up her throat and into her mouth. 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

## 2018-09-17 NOTE — Telephone Encounter (Signed)
Pt is calling and will be waiting for letter in her mychart

## 2018-09-17 NOTE — Telephone Encounter (Signed)
Pt called in c/o chest tightness with a sharp pain in her chest, feels like her throat is closing up and breathing difficulty.   She was referred to the ED Monday evening for same symptoms per pt and did not go.   She saw Dr. Carmelia Roller on 3/15 or 16/2020 per pt.   She wants a note for work.   She said he wrote her a note after her visit but she wants another note.      See triage notes for details.   She was hard to follow and jumped around with different symptoms at different times while triaging her.   Due to the chest tightness, throat closing up and breathing difficulty I have referred her to the ED/Urgenet Care.  She said she is going to the [facility].   (She went before and found nothing wrong with her per pt).   She does have asthma but EKG and all were normal per pt.  She was getting a call from her job so she said she had to go but that she would go to the [facility] urgent Care.  I sent these notes to Dr Carmelia Roller for his information.  Reason for Disposition . Patient sounds very sick or weak to the triager  Answer Assessment - Initial Assessment Questions 1. RESPIRATORY STATUS: "Describe your breathing?" (e.g., wheezing, shortness of breath, unable to speak, severe coughing)      My chest is tight and my throat is tight too.   My throat is burning.   I have not been outside.   My chest is tight.   It started yesterday.   I used my FLovent inhaler.   Last night started coughing.   I took [employer] that helped the cough.    My temp 97.2 I don't have to go to work today.   I'm in the house today . 2. ONSET: "When did this breathing problem begin?"      Last night.   She has not used her Flovent inhaler this morning so I instructed her to go do that now which she did. I've not had an asthma attack since I was little.   But over this past week my asthma has been acting up.   The pollen bothers me.    I took a antihistane prescribed for me the other day.   I took it in the evening. 3.  PATTERN "Does the difficult breathing come and go, or has it been constant since it started?"      It's been constant since I got up this morning.  I also have a headache this morning when I got up.   I took a Tylenol and it's getting better. 4. SEVERITY: "How bad is your breathing?" (e.g., mild, moderate, severe)    - MILD: No SOB at rest, mild SOB with walking, speaks normally in sentences, can lay down, no retractions, pulse < 100.    - MODERATE: SOB at rest, SOB with minimal exertion and prefers to sit, cannot lie down flat, speaks in phrases, mild retractions, audible wheezing, pulse 100-120.    - SEVERE: Very SOB at rest, speaks in single words, struggling to breathe, sitting hunched forward, retractions, pulse > 120      I'm short of breath a little bit.   I'm just staying in the bed for now. 5. RECURRENT SYMPTOM: "Have you had difficulty breathing before?" If so, ask: "When was the last time?" and "What happened that time?"  I went to his office on 08/31/2018.   He gave me a breathing treatment that helped in the office.    The last time was when I was much younger.   6. 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   Two positive cases of coronavirus.   I went into that room.   I don't remember if I had a mask on or not.   No fever for 2 weeks.  I went into the room to give them their food.   The CMAs have direct contact with the pts then with me. Dr. Carmelia Roller wrote a note on 09/01/2018 for me.   I called Monday and yesterday that I need a note for my work.    7. LUNG HISTORY: "Do you have any history of lung disease?"  (e.g., pulmonary embolus, asthma, emphysema)     Asthma 8. CAUSE: "What do you think is causing the breathing problem?"   I called the nurse line because I was having chest pain.   I went to the [facility].   They did a EKG it was normal and everything was normal.    Sunday and Monday still having sharp pains.  They told me to go to the ED but I didn't go on Monday night.   I started feeling better.    It's tender on the left side of my chest when I touch it.   My throat feels like it's closing up.  I feel it more in my throat.  I went to the Urgent Care and they said nothing was wrong with me. I don't usually have trouble with my asthma.  I feel a little better since I took my Flovent inhaler.    9. OTHER SYMPTOMS: "Do you have any other symptoms? (e.g., dizziness, runny nose, cough, chest pain, fever)     Chest tightness with sharp pain, my throat feels like it's closing up. 10. PREGNANCY: "Is there any chance you are pregnant?" "When was your last menstrual period?"       Not asked 11. TRAVEL: "Have you traveled out of the country in the last month?" (e.g., travel history, exposures)       No  Protocols used: BREATHING DIFFICULTY-A-AH

## 2018-09-17 NOTE — Telephone Encounter (Signed)
I have done letter already and sent through [facility] and patient notified.

## 2018-09-18 NOTE — ED Provider Notes (Signed)
[facility]   [id number] 09/17/18 Arrival Time: 1428  ASSESSMENT & PLAN:  1. Sore throat   2. Wheezing    Question recent increase in pollen as tiggering her current symptoms. Afebrile. No suspicion for COVID-19. Discussed.  Meds ordered this encounter  Medications  . dexamethasone (DECADRON) injection 10 mg   Asthma precautions given. OTC symptom care as needed.  Follow-up Information    Schedule an appointment as soon as possible for a visit  with Sharlene Dory, DO.   Specialty:  Family Medicine Contact information: [address] [place] [address] [phone]          Reviewed expectations re: course of current medical issues. Questions answered. Outlined signs and symptoms indicating need for more acute intervention. Patient verbalized understanding. After Visit Summary given.  SUBJECTIVE: History from: patient.  Maria Smith is a 23 y.o. female who presents with complaint of intermittent wheezing along with occasional sore throat. I saw her recently for the same. No specific worsening of symptoms; "just still there". Describes wheezing as mild to moderate when present. Fever: no. Overall normal PO intake without n/v. Sick contacts: no. Typically her asthma is well controlled. Inhaler use: prn. OTC treatment: none reported.   Social History   Tobacco Use  Smoking Status Never Smoker  Smokeless Tobacco Never Used    ROS: As per HPI. All other systems negative.    OBJECTIVE:  Vitals:   09/17/18 1512  BP: 116/75  Pulse: 81  Resp: 17  Temp: 98.7 F (37.1 C)  TempSrc: Oral  SpO2: 100%     General appearance: alert; no distress HEENT: moderate nasal congestion; clear runny nose; throat with mild irritation; no tonsil enlargement or exudates Neck: supple without LAD Cv: RRR without murmer Lungs: unlabored respirations, mild bilateral expiratory wheezing; cough: mild and non-productive; no  significant respiratory distress Skin: warm and dry Psychological: alert and cooperative; normal mood and affect  No Known Allergies  Past Medical History:  Diagnosis Date  . Allergic rhinitis   . Asthma   . Eczema   . Environmental allergies    Age 21 years  . Generalized headaches    Began at age 71 years  . Influenza    Age 60 years  . 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 Streptococcal infection(041.00)    Age 70 and 23 years old   Family History  Problem Relation Age of Onset  . Migraines Paternal Grandmother   . Migraines Paternal Grandfather   . HIV Father   . Diabetes Mother   . Hypertension Mother   . Heart disease Mother        No details  . Anemia Mother   . Anemia Sister   . Migraines Paternal Aunt   . Migraines Maternal Uncle        Maternal Great Uncle  . Diabetes Other        Family History  . Hypertension Other        Family History  . Depression Other        Family History  . Asthma Other        Family History  . Allergic rhinitis Other        Family History   Social History   Socioeconomic History  . Marital status: Single    Spouse name: Not on file  . Number of children: Not on file  . Years of education: Not on  file  . Highest education level: Not on file  Occupational History  . Not on file  Social Needs  . Financial resource strain: Not on file  . Food insecurity:    Worry: Not on file    Inability: Not on file  . Transportation needs:    Medical: Not on file    Non-medical: Not on file  Tobacco Use  . Smoking status: Never Smoker  . Smokeless tobacco: Never Used  Substance and Sexual Activity  . Alcohol use: Yes    Comment: occasionally  . Drug use: No  . Sexual activity: Not on file    Comment: female partner  Lifestyle  . Physical activity:    Days per week: Not on file    Minutes per session: Not on file  . Stress: Not on file  Relationships  . Social connections:    Talks on phone: Not on file     Gets together: Not on file    Attends religious service: Not on file    Active member of club or organization: Not on file    Attends meetings of clubs or organizations: Not on file    Relationship status: Not on file  . Intimate partner violence:    Fear of current or ex partner: Not on file    Emotionally abused: Not on file    Physically abused: Not on file    Forced sexual activity: Not on file  Other Topics Concern  . 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             Mardella Layman, MD 09/18/18 [phone]

## 2018-09-22 ENCOUNTER — Ambulatory Visit: Payer: Self-pay

## 2018-09-22 ENCOUNTER — Other Ambulatory Visit: Payer: Self-pay

## 2018-09-22 ENCOUNTER — Encounter: Payer: Self-pay | Admitting: Family Medicine

## 2018-09-22 ENCOUNTER — Ambulatory Visit (INDEPENDENT_AMBULATORY_CARE_PROVIDER_SITE_OTHER): Payer: BLUE CROSS/BLUE SHIELD | Admitting: Family Medicine

## 2018-09-22 DIAGNOSIS — J302 Other seasonal allergic rhinitis: Secondary | ICD-10-CM

## 2018-09-22 DIAGNOSIS — J4521 Mild intermittent asthma with (acute) exacerbation: Secondary | ICD-10-CM

## 2018-09-22 MED ORDER — IPRATROPIUM-ALBUTEROL 0.5-2.5 (3) MG/3ML IN SOLN: 3.0000 mL | RESPIRATORY_TRACT | 1 refills | Status: DC | PRN

## 2018-09-22 MED ORDER — MONTELUKAST SODIUM 10 MG PO TABS: 10.0000 mg | ORAL_TABLET | Freq: Every day | ORAL | 3 refills | Status: DC

## 2018-09-22 NOTE — Telephone Encounter (Signed)
Pt. Called to report burning in throat and feeling like throat is closing up at times for past 3 weeks.  Recently seen at [facility] on 4/1.  Was advised to stay home for one week; doesn't feel that her symptoms are getting any better.  C/o a "raw, burning feeling in back of throat"  Stated she sees "red bumps on back of tongue and on upper throat."  Denied difficulty swallowing.  Also reports intermittent wheeze, intermittent productive cough with a dark yellow mucus.  Denied any fever; has been checking it regularly.  Stated she feels "like a muscle spasm in upper left chest"; denied any chest discomfort at this time.  Reported she has been gargling with sal water, using [employer] mouth rinse, taking OTC Allergy medication, using cough drops, taking Naproxen for the pain, using steroid inhaler prn.  Stated she feels like her "throat is on fire."  Is requesting a strep test and a chest x-ray.  Advised will schedule a virtual visit with PCP for further evaluation/ recommendation.     Called the Scheduling line at PCP office.  Call transferred to [facility], to schedule pt. an appt.        Reason for Disposition . SEVERE (e.g., excruciating) throat pain    C/o a burning sensation in back of throat and feels like throat is closing up.  Stated "when it's bad, I hear a wheeze, and it feels like I'm breathing fire."  It is bad when I first wake up, and it happens off and on during day."   "If I use a cough drop, it eases for a little while, then starts again."  Answer Assessment - Initial Assessment Questions 1. ONSET: "When did the throat start hurting?" (Hours or days ago)      About 3 weeks ago 2. SEVERITY: "How bad is the sore throat?" (Scale 1-10; mild, moderate or severe)   - MILD (1-3):  doesn't interfere with eating or normal activities   - MODERATE (4-7): interferes with eating some solids and normal activities   - SEVERE (8-10):  excruciating pain, interferes with most normal activities   - SEVERE DYSPHAGIA:  can't swallow liquids, drooling     "Severe" 3. STREP EXPOSURE: "Has there been any exposure to strep within the past week?" If so, ask: "What type of contact occurred?"      Denied  4.  VIRAL SYMPTOMS: "Are there any symptoms of a cold, such as a runny nose, cough, hoarse voice or red eyes?"       Persistent wheeze, coughing intermittently with production of dk. yellow mucus, c/o burning in throat but stated it does not hurt to swallow; headache, loss of appetite, intermittent muscle spasm upper left chest; denied chest discomfort at present.    5. FEVER: "Do you have a fever?" If so, ask: "What is your temperature, how was it measured, and when did it start?"    Denied fever 6. PUS ON THE TONSILS: "Is there pus on the tonsils in the back of your throat?"     Look red with bumps on back of tongue and throat area   7. OTHER SYMPTOMS: "Do you have any other symptoms?" (e.g., difficulty breathing, headache, rash)     See # 4 8. PREGNANCY: "Is there any chance you are pregnant?" "When was your last menstrual period?"     LMP: 09/02/18  Protocols used: SORE THROAT-A-AH

## 2018-09-22 NOTE — Progress Notes (Signed)
Virtual Visit via Video Note  I connected with Maria Smith on 09/22/18 at  9:15 AM EDT by a video enabled telemedicine application and verified that I am speaking with the correct person using two identifiers.   I discussed the limitations of evaluation and management by telemedicine and the availability of in person appointments. The patient expressed understanding and agreed to proceed.  History of Present Illness: Has been having issues with allergies and asthma over past several weeks. Steroids help, but make her heart beat quickly. She has been to UC 3 times and rx'd NSAIDs and steroid injections that temporarily help, but come back. Sore/raw throat, swelling in throat and chest tightness/sob are biggest issues. She is compliant with Xyzal and Flonase. Has not been using albuterol too often, uses ICS daily.    Observations/Objective: No conversational dyspnea Age appropriate judgment and insight Nml affect and mood  Assessment and Plan: Mild intermittent asthma with (acute) exacerbation - Plan: ipratropium-albuterol (DUONEB) 0.5-2.5 (3) MG/3ML SOLN, montelukast (SINGULAIR) 10 MG tablet  Seasonal allergies - Plan: montelukast (SINGULAIR) 10 MG tablet  Add Singulair. Cont ICS, INCS, and Xyzal. OK to use neb if having sob. Will send message in 2 weeks if no better and will refer to allergy.  Follow Up Instructions: PRN   I discussed the assessment and treatment plan with the patient. The patient was provided an opportunity to ask questions and all were answered. The patient agreed with the plan and demonstrated an understanding of the instructions.   The patient was advised to call back or seek an in-person evaluation if the symptoms worsen or if the condition fails to improve as anticipated.   Jilda Roche [place], DO

## 2018-09-22 NOTE — Telephone Encounter (Signed)
Virtual visit scheduled.  

## 2018-09-23 ENCOUNTER — Telehealth (HOSPITAL_COMMUNITY): Payer: Self-pay | Admitting: Emergency Medicine

## 2018-09-23 NOTE — Telephone Encounter (Signed)
Pt called, asking for note to be revoked for work priveledges, wants to go back to work early.  Pt was seen yesterday by PCP.  This RN referred patient to PCP

## 2018-09-25 ENCOUNTER — Ambulatory Visit (INDEPENDENT_AMBULATORY_CARE_PROVIDER_SITE_OTHER): Payer: BLUE CROSS/BLUE SHIELD

## 2018-09-25 ENCOUNTER — Other Ambulatory Visit: Payer: Self-pay

## 2018-09-25 ENCOUNTER — Ambulatory Visit (HOSPITAL_COMMUNITY)
Admission: EM | Admit: 2018-09-25 | Discharge: 2018-09-25 | Disposition: A | Discharge status: Home / Self Care | Payer: BLUE CROSS/BLUE SHIELD | Attending: Internal Medicine | Admitting: Internal Medicine

## 2018-09-25 ENCOUNTER — Encounter (HOSPITAL_COMMUNITY): Payer: Self-pay

## 2018-09-25 DIAGNOSIS — M940 Chondrocostal junction syndrome [Tietze]: Secondary | ICD-10-CM | POA: Diagnosis not present

## 2018-09-25 DIAGNOSIS — R079 Chest pain, unspecified: Secondary | ICD-10-CM

## 2018-09-25 NOTE — ED Provider Notes (Addendum)
[facility]    CSN: [id number] Arrival date & time: 09/25/18  1006     History   Chief Complaint Chief Complaint  Patient presents with  . Chest Pain    HPI Maria Smith is a 23 y.o. female with a history of asthma recentlyseen for asthma exacerbation comes back to urgent care for sharp central to left-sided chest pain for few days duration.  Pain is now radiating to the jaw or the shoulder.  Is aggravated by palpation over the chest wall, taking a deep breath and movement.  She continues to have cough productive of discolored sputum.  No fever or chills.  No contact with sick persons.  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   Past Medical History:  Diagnosis Date  . Allergic rhinitis   . Asthma   . Eczema   . Environmental allergies    Age 24 years  . Generalized headaches    Began at age 23 years  . Influenza    Age 8 years  . 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 Streptococcal infection(041.00)    Age [age] years old    Patient Active Problem List   Diagnosis Date Noted  . Seasonal allergies 09/22/2018  . Mild intermittent asthma with (acute) exacerbation 09/01/2018  . 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 Migraine with aura and without status migrainosus, not intractable 09/11/2012  . Variants of migraine, not elsewhere classified, without mention of intractable migraine without mention of status migrainosus 09/11/2012  . Unspecified constipation 09/11/2012  . Syncope 08/31/2010  . Neck Pain 08/31/2010  . Insomnia 08/31/2010    Past Surgical History:  Procedure Laterality Date  . TONSILLECTOMY  2012  . WISDOM TOOTH EXTRACTION      OB History    Gravida  0   Para  0   Term  0   Preterm  0   AB  0   Living  0     SAB  0   TAB  0   Ectopic  0   Multiple  0   Live Births  0            Home Medications    Prior to Admission medications   Medication Sig Start Date End Date Taking? Authorizing  Provider  albuterol (PROVENTIL HFA;VENTOLIN HFA) 108 (90 Base) MCG/ACT inhaler Inhale 2 puffs into the lungs every 6 (six) hours as needed for wheezing or shortness of breath. 09/01/18   Sharlene DoryWendling, Nicholas Paul, DO  fluticasone (FLOVENT HFA) 110 MCG/ACT inhaler Inhale 2 puffs into the lungs 2 (two) times daily. 09/01/18   Sharlene DoryWendling, Nicholas Paul, DO  ipratropium-albuterol (DUONEB) 0.5-2.5 (3) MG/3ML SOLN Take 3 mLs by nebulization every 4 (four) hours as needed. 09/22/18   Sharlene DoryWendling, Nicholas Paul, DO  levocetirizine (XYZAL) 5 MG tablet Take 1 tablet (5 mg total) by mouth every evening. 09/01/18   Sharlene DoryWendling, Nicholas Paul, DO  montelukast (SINGULAIR) 10 MG tablet Take 1 tablet (10 mg total) by mouth at bedtime. 09/22/18   Sharlene DoryWendling, Nicholas Paul, DO  naproxen (NAPROSYN) 500 MG tablet Take 1 tablet (500 mg total) by mouth 2 (two) times daily. 09/14/18   Wieters, Hallie C, PA-C  Omega-3 Fatty Acids (FISH OIL PO) Take by mouth every other day.    [provider]  Prenatal Vit-Fe Fumarate-FA (PRENATAL VITAMIN PO) Take by mouth.    [provider]    Family History Family  History  Problem Relation Age of Onset  . Migraines Paternal Grandmother   . Migraines Paternal Grandfather   . HIV Father   . Diabetes Mother   . Hypertension Mother   . Heart disease Mother        No details  . Anemia Mother   . Anemia Sister   . Migraines Paternal Aunt   . Migraines Maternal Uncle        Maternal Great Uncle  . Diabetes Other        Family History  . Hypertension Other        Family History  . Depression Other        Family History  . Asthma Other        Family History  . Allergic rhinitis Other        Family History    Social History Social History   Tobacco Use  . Smoking status: Never Smoker  . Smokeless tobacco: Never Used  Substance Use Topics  . Alcohol use: Yes    Comment: occasionally  . Drug use: No     Allergies   Patient has no known allergies.   Review of  Systems Review of Systems  Constitutional: Positive for activity change. Negative for appetite change, chills, diaphoresis, fatigue and fever.  HENT: Negative for congestion, mouth sores, postnasal drip, rhinorrhea, sinus pain and sore throat.   Eyes: Negative for pain and discharge.  Respiratory: Positive for cough, chest tightness, shortness of breath and wheezing.   Gastrointestinal: Negative for abdominal distention and abdominal pain.  Endocrine: Negative.   Genitourinary: Negative.   Musculoskeletal: Positive for back pain and myalgias.  Skin: Negative.   Neurological: Negative.   Psychiatric/Behavioral: Negative.      Physical Exam Triage Vital Signs ED Triage Vitals  Enc Vitals Group     BP 09/25/18 1031 111/70     Pulse Rate 09/25/18 1031 78     Resp 09/25/18 1031 16     Temp 09/25/18 1031 98.9 F (37.2 C)     Temp Source 09/25/18 1031 Oral     SpO2 09/25/18 1031 100 %     Weight --      Height --      Head Circumference --      Peak Flow --      Pain Score 09/25/18 1033 9     Pain Loc --      Pain Edu? --      Excl. in GC? --    No data found.  Updated Vital Signs BP 111/70 (BP Location: Left Arm)   Pulse 78   Temp 98.9 F (37.2 C) (Oral)   Resp 16   LMP 09/02/2018 (Exact Date)   SpO2 100%   Visual Acuity Right Eye Distance:   Left Eye Distance:   Bilateral Distance:    Right Eye Near:   Left Eye Near:    Bilateral Near:     Physical Exam Constitutional:      Appearance: She is well-developed.  Neck:     Musculoskeletal: Normal range of motion and neck supple.  Cardiovascular:     Rate and Rhythm: Normal rate and regular rhythm.  No extrasystoles are present.    Chest Wall: PMI is not displaced.  Pulmonary:     Effort: Pulmonary effort is normal. No accessory muscle usage.     Breath sounds: Normal breath sounds. No decreased breath sounds, wheezing or rhonchi.  Chest:     Chest wall:  Tenderness present. No deformity or crepitus.   Abdominal:     General: Bowel sounds are normal. There is no abdominal bruit.     Palpations: Abdomen is soft. There is no hepatomegaly.  Musculoskeletal: Normal range of motion.       Arms:  Lymphadenopathy:     Cervical: No cervical adenopathy.  Skin:    Capillary Refill: Capillary refill takes less than 2 seconds.  Neurological:     General: No focal deficit present.     Mental Status: She is alert and oriented to person, place, and time.      UC Treatments / Results  Labs (all labs ordered are listed, but only abnormal results are displayed) Labs Reviewed - No data to display  EKG None  Radiology No results found.  Procedures Procedures (including critical care time)  Medications Ordered in UC Medications - No data to display  Initial Impression / Assessment and Plan / UC Course  I have reviewed the triage vital signs and the nursing notes.  Pertinent labs & imaging results that were available during my care of the patient were reviewed by me and considered in my medical decision making (see chart for details).     1.  Left costochondritis: Warm compresses to be limited to less than 10 minutes at a time Tylenol as needed for chest pain  2.  Mild persistent asthma with mild exacerbation, improving: Continue bronchodilator treatments, steroids.  3.  Persistent cough with discolored sputum: Chest x-ray is negative for any acute lung infiltrate.  X-ray was independently reviewed by me.  Final Clinical Impressions(s) / UC Diagnoses   Final diagnoses:  Costochondritis   Discharge Instructions   None    ED Prescriptions    None     Controlled Substance Prescriptions [place] Controlled Substance Registry consulted? No   Merrilee Jansky, MD 09/25/18 1102    Merrilee Jansky, MD 09/25/18 1120

## 2018-09-25 NOTE — ED Triage Notes (Signed)
Pt present chest pain, pt states the pain is located in the central part of her chest. Sensitive to touch and when she takes deep breaths.

## 2018-10-05 ENCOUNTER — Emergency Department (HOSPITAL_COMMUNITY)
Admission: EM | Admit: 2018-10-05 | Discharge: 2018-10-05 | Disposition: A | Discharge status: Home / Self Care | Payer: BLUE CROSS/BLUE SHIELD | Attending: Emergency Medicine | Admitting: Emergency Medicine

## 2018-10-05 ENCOUNTER — Emergency Department (HOSPITAL_COMMUNITY): Payer: BLUE CROSS/BLUE SHIELD

## 2018-10-05 ENCOUNTER — Other Ambulatory Visit: Payer: Self-pay

## 2018-10-05 DIAGNOSIS — Z79899 Other long term (current) drug therapy: Secondary | ICD-10-CM | POA: Insufficient documentation

## 2018-10-05 DIAGNOSIS — Z20828 Contact with and (suspected) exposure to other viral communicable diseases: Secondary | ICD-10-CM | POA: Insufficient documentation

## 2018-10-05 DIAGNOSIS — J029 Acute pharyngitis, unspecified: Secondary | ICD-10-CM | POA: Diagnosis not present

## 2018-10-05 DIAGNOSIS — J45909 Unspecified asthma, uncomplicated: Secondary | ICD-10-CM | POA: Insufficient documentation

## 2018-10-05 DIAGNOSIS — R059 Cough, unspecified: Secondary | ICD-10-CM

## 2018-10-05 DIAGNOSIS — Z20822 Contact with and (suspected) exposure to covid-19: Secondary | ICD-10-CM

## 2018-10-05 DIAGNOSIS — R05 Cough: Secondary | ICD-10-CM | POA: Diagnosis not present

## 2018-10-05 LAB — GROUP A STREP BY PCR: Group A Strep by PCR: NOT DETECTED

## 2018-10-05 LAB — SARS CORONAVIRUS 2 BY RT PCR (HOSPITAL ORDER, PERFORMED IN ~~LOC~~ HOSPITAL LAB): SARS Coronavirus 2: NEGATIVE

## 2018-10-05 MED ORDER — OMEPRAZOLE 20 MG PO CPDR: 20.0000 mg | DELAYED_RELEASE_CAPSULE | Freq: Every day | ORAL | 0 refills | Status: DC

## 2018-10-05 MED ORDER — ACETAMINOPHEN 500 MG PO TABS: 1000.0000 mg | ORAL_TABLET | Freq: Four times a day (QID) | ORAL | 0 refills | Status: DC | PRN

## 2018-10-05 NOTE — ED Notes (Signed)
Bed: WA18 Expected date:  Expected time:  Means of arrival:  Comments: Negative pressure 

## 2018-10-05 NOTE — ED Triage Notes (Signed)
Pt reports for last few weeks "it feels like my throat is closing up and I can't breathe."  Pt reports going to UC recently and being tx for her asthma but the tx's are not helping.  Pt used flo-vent inhaler today with no relief.    Pt states that shes been having low grade fevers at home (99.1-99.9) within last week.   Pt reports taking Tylenol for fever appx 1200 today.    Pt reports having a congested cough, "unable to cough it up", with clear thick sputum today.

## 2018-10-05 NOTE — ED Notes (Signed)
Bed: WA12 Expected date:  Expected time:  Means of arrival:  Comments: Negative Pressure 

## 2018-10-05 NOTE — ED Provider Notes (Signed)
[facility]-EMERGENCY DEPT Provider Note   CSN: [id number] Arrival date & time: 10/05/18  1744    History   Chief Complaint No chief complaint on file.   HPI Maria Smith is a 23 y.o. female.     HPI Patient reports that she has been having problems with cough and sore throat since mid March.  She has had several evaluations and been treated for asthma exacerbations.  She reports she has had multiple courses of medications including steroids, Tessalon Perls, inhalers with no improvement.  She reports she continues to have a burning sore throat and sometimes feels like her throat is closing up.  She reports that she has had a lot of coughing paroxysms.  No vomiting or diarrhea.  No lower extremity swelling.  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  Reports she was on a 7-day quarantine but did not improve and her symptoms have persisted. Past Medical History:  Diagnosis Date  . Allergic rhinitis   . Asthma   . Eczema   . Environmental allergies    Age 52 years  . Generalized headaches    Began at age 12 years  . Influenza    Age 3 years  . 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 Streptococcal infection(041.00)    Age 64 and 23 years old    Patient Active Problem List   Diagnosis Date Noted  . Seasonal allergies 09/22/2018  . Mild intermittent asthma with (acute) exacerbation 09/01/2018  . 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 Migraine with aura and without status migrainosus, not intractable 09/11/2012  . Variants of migraine, not elsewhere classified, without mention of intractable migraine without mention of status migrainosus 09/11/2012  . Unspecified constipation 09/11/2012  . Syncope 08/31/2010  . Neck Pain 08/31/2010  . Insomnia 08/31/2010    Past Surgical History:  Procedure Laterality Date  . TONSILLECTOMY  2012  . WISDOM TOOTH  EXTRACTION       OB History    Gravida  0   Para  0   Term  0   Preterm  0   AB  0   Living  0     SAB  0   TAB  0   Ectopic  0   Multiple  0   Live Births  0            Home Medications    Prior to Admission medications   Medication Sig Start Date End Date Taking? Authorizing Provider  acetaminophen (TYLENOL) 325 MG tablet Take 325 mg by mouth every 6 (six) hours as needed for fever.   Yes [provider]  albuterol (PROVENTIL HFA;VENTOLIN HFA) 108 (90 Base) MCG/ACT inhaler Inhale 2 puffs into the lungs every 6 (six) hours as needed for wheezing or shortness of breath. 09/01/18  Yes Sharlene Dory, DO  fluticasone (FLOVENT HFA) 110 MCG/ACT inhaler Inhale 2 puffs into the lungs 2 (two) times daily. 09/01/18  Yes Sharlene Dory, DO  ipratropium-albuterol (DUONEB) 0.5-2.5 (3) MG/3ML SOLN Take 3 mLs by nebulization every 4 (four) hours as needed. 09/22/18  Yes Wendling, Jilda Roche, DO  levocetirizine (XYZAL) 5 MG tablet Take 1 tablet (5 mg total) by mouth every evening. 09/01/18  Yes Wendling, Jilda Roche, DO  TURMERIC PO Take 1 capsule by mouth daily.   Yes [provider]  acetaminophen (TYLENOL) 500  MG tablet Take 2 tablets (1,000 mg total) by mouth every 6 (six) hours as needed. 10/05/18   Arby BarrettePfeiffer, Jazzmon Prindle, MD  montelukast (SINGULAIR) 10 MG tablet Take 1 tablet (10 mg total) by mouth at bedtime. Patient not taking: Reported on 10/05/2018 09/22/18   Sharlene DoryWendling, Nicholas Paul, DO  naproxen (NAPROSYN) 500 MG tablet Take 1 tablet (500 mg total) by mouth 2 (two) times daily. Patient not taking: Reported on 10/05/2018 09/14/18   Wieters, Fran LowesHallie C, PA-C  omeprazole (PRILOSEC) 20 MG capsule Take 1 capsule (20 mg total) by mouth daily. 10/05/18   Arby BarrettePfeiffer, Daxtyn Rottenberg, MD    Family History Family History  Problem Relation Age of Onset  . Migraines Paternal Grandmother   . Migraines Paternal Grandfather   . HIV Father   . Diabetes Mother   .  Hypertension Mother   . Heart disease Mother        No details  . Anemia Mother   . Anemia Sister   . Migraines Paternal Aunt   . Migraines Maternal Uncle        Maternal Great Uncle  . Diabetes Other        Family History  . Hypertension Other        Family History  . Depression Other        Family History  . Asthma Other        Family History  . Allergic rhinitis Other        Family History    Social History Social History   Tobacco Use  . Smoking status: Never Smoker  . Smokeless tobacco: Never Used  Substance Use Topics  . Alcohol use: Yes    Comment: occasionally  . Drug use: No     Allergies   Patient has no known allergies.   Review of Systems Review of Systems  10 Systems reviewed and are negative for acute change except as noted in the HPI.   Physical Exam Updated Vital Signs BP 102/70   Pulse 68   Temp 98.4 F (36.9 C) (Oral)   Resp 18   Ht 4\' 10"  (1.473 m)   Wt 57.6 kg   LMP 09/02/2018   SpO2 99%   BMI 26.54 kg/m   Physical Exam Constitutional:      Appearance: Normal appearance.  HENT:     Head: Normocephalic and atraumatic.     Mouth/Throat:     Mouth: Mucous membranes are moist.     Pharynx: Oropharynx is clear.  Eyes:     Extraocular Movements: Extraocular movements intact.     Conjunctiva/sclera: Conjunctivae normal.  Cardiovascular:     Rate and Rhythm: Normal rate and regular rhythm.  Pulmonary:     Effort: Pulmonary effort is normal.     Breath sounds: Normal breath sounds.  Abdominal:     General: There is no distension.     Palpations: Abdomen is soft.     Tenderness: There is no abdominal tenderness. There is no guarding.  Musculoskeletal: Normal range of motion.        General: No swelling or tenderness.     Right lower leg: No edema.     Left lower leg: No edema.  Skin:    General: Skin is warm and dry.  Neurological:     General: No focal deficit present.     Mental Status: She is alert and oriented to  person, place, and time.     Coordination: Coordination normal.  Psychiatric:  Mood and Affect: Mood normal.      ED Treatments / Results  Labs (all labs ordered are listed, but only abnormal results are displayed) Labs Reviewed  SARS CORONAVIRUS 2 (HOSPITAL ORDER, PERFORMED IN [facility])  GROUP A STREP BY PCR    EKG None  Radiology Dg Chest Port 1 View  Result Date: 10/05/2018 CLINICAL DATA:  Acute cough EXAM: PORTABLE CHEST 1 VIEW COMPARISON:  09/25/2018 and prior radiographs FINDINGS: The cardiomediastinal silhouette is unremarkable. There is no evidence of focal airspace disease, pulmonary edema, suspicious pulmonary nodule/mass, pleural effusion, or pneumothorax. No acute bony abnormalities are identified. IMPRESSION: No active disease. Electronically Signed   By: Harmon Pier M.D.   On: 10/05/2018 19:36    Procedures Procedures (including critical care time)  Medications Ordered in ED Medications - No data to display   Initial Impression / Assessment and Plan / ED Course  I have reviewed the triage vital signs and the nursing notes.  Pertinent labs & imaging results that were available during my care of the patient were reviewed by me and considered in my medical decision making (see chart for details).        Patient has had protracted cough and pharyngitis symptoms without resolution with symptomatic treatments.  She does predominantly focus on burning quality of pharyngitis.  Her upper airway is widely patent without positive findings.  Strep is negative.  Clinically, patient is well and stable in appearance.  Final Clinical Impressions(s) / ED Diagnoses   Final diagnoses:  Pharyngitis, unspecified etiology  Cough  Close Exposure to Covid-19 Virus    ED Discharge Orders         Ordered    omeprazole (PRILOSEC) 20 MG capsule  Daily     10/05/18 2242    acetaminophen (TYLENOL) 500 MG tablet  Every 6 hours PRN     10/05/18 2242            Arby Barrette, MD 10/06/18 0013

## 2018-10-05 NOTE — ED Notes (Signed)
Pt able to speak in full sentences, on room air.  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  Pt denies having direct contact with anyone positive for COVID-19.

## 2018-10-05 NOTE — Discharge Instructions (Signed)
1.  Continue to self isolate.  Follow-up on the results of your coronavirus testing tomorrow. 2.  You may make salt water gargles and take acetaminophen for sore throat.  Also, start taking daily omeprazole for the next 2 weeks. 3.  If your sore throat is persisting beyond the next week and a half, you will need recheck and possible referral to an ear nose throat specialist. 4.  Follow precautionary instructions for coronavirus. 5.  Return to the emergency department if your symptoms are worsening.

## 2018-10-15 ENCOUNTER — Ambulatory Visit: Payer: Self-pay | Admitting: *Deleted

## 2018-10-15 ENCOUNTER — Other Ambulatory Visit: Payer: Self-pay

## 2018-10-15 ENCOUNTER — Encounter: Payer: Self-pay | Admitting: Family Medicine

## 2018-10-15 ENCOUNTER — Ambulatory Visit (INDEPENDENT_AMBULATORY_CARE_PROVIDER_SITE_OTHER): Payer: BLUE CROSS/BLUE SHIELD | Admitting: Family Medicine

## 2018-10-15 DIAGNOSIS — M542 Cervicalgia: Secondary | ICD-10-CM | POA: Diagnosis not present

## 2018-10-15 DIAGNOSIS — R0789 Other chest pain: Secondary | ICD-10-CM

## 2018-10-15 DIAGNOSIS — R002 Palpitations: Secondary | ICD-10-CM

## 2018-10-15 MED ORDER — DILTIAZEM HCL ER COATED BEADS 120 MG PO CP24: 120.0000 mg | ORAL_CAPSULE | Freq: Every day | ORAL | 1 refills | Status: DC

## 2018-10-15 NOTE — Telephone Encounter (Signed)
Virtual visit scheduled.  

## 2018-10-15 NOTE — Progress Notes (Signed)
CC: chest pain  Subjective: Patient is a 23 y.o. female here for chest pain. Due to COVID-19 pandemic, we are interacting via web portal for an electronic face-to-face visit. I verified patient's ID using 2 identifiers. Patient agreed to proceed with visit via this method. Patient is at home, I am at office. Patient and I are present for visit.   Pt has been having intermittent chest pain over past mo. Went to UC and had neg workup. She has followed with cards in past. Nothing found in workup. R upper chest wall pain, hurts when she touches it.   Having L neck pain over past day. No inj or change in activity. No neurologic s/s's. Tylenol unhelpful. ROM intact, but painful throughout movements. She is mainly a back sleeper and has a thin pillow.  Racing heart beat in middle of night. Has gone on for many months. Cards worked up, she had been on beta blocker in past. Currently on nothing. Denies assoc pain or sob.   ROS: Heart: + chest pain  Lungs: Denies SOB   Past Medical History:  Diagnosis Date  . Allergic rhinitis   . Asthma   . Eczema   . Environmental allergies    Age 48 years  . Generalized headaches    Began at age 33 years  . Influenza    Age 72 years  . 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 Streptococcal infection(041.00)    Age 24 and 23 years old    Objective: No conversational dyspnea Age appropriate judgment and insight Nml affect and mood  Assessment and Plan: Chest wall pain  Neck pain  Palpitations - Plan: diltiazem (CARDIZEM CD) 120 MG 24 hr capsule  1- NSAIDs, upper body yoga, stretch pecs, ice, heat, Tylenol. 2- Stretches/exercises, heat, NSAIDs. 3- trial CCB. May be anxiety related.  F/u in 4 weeks.  The patient voiced understanding and agreement to the plan.  Jilda Roche [place], DO 10/15/18  2:20 PM

## 2018-10-15 NOTE — Telephone Encounter (Signed)
Patient experiencing sharp intermittent CP at the upper left region of her chest as well near the sternum. Has had this occurring for several months but feels it has worsened over the last week. Usually last less than 5 minutes. Feels the sharpness when she takes a deep breath. She is unsure if it radiates b/c she also has intermittent neck soreness on the left and back of neck with movement.Reports having headaches that tylenol helps, using heat and muscle rub, also. No known injury to the areas. She is able to do chin to chest without difficulty. Reports she was told she had inflammation around the chest wall in the past. Reports a normal ekg at UC  Last month. Seen at ED for atypical chest on 3/29 and for costochondritis on 4/9. No fever no SOB no dizziness no sweating no palpitations reported. States her pulse jumps around at times. No long travels, no known exposures to corona since she tested negative in march. Reviewed symptoms requiring immediate evaluation. Stated she understood. warm transferred for appointment.  Reason for Disposition . [1] Chest pain lasting <= 5 minutes AND [2] NO chest pain or cardiac symptoms now(Exceptions: pains lasting a few seconds)  Answer Assessment - Initial Assessment Questions 1. LOCATION: "Where does it hurt?"       Top left portion of chest and middle of sternum chest. Would say they are achy pains 2. RADIATION: "Does the pain go anywhere else?" (e.g., into neck, jaw, arms, back)     Unsure. Also has neck pains that are sharp sometimes constant and last 4-5 minutes. 3. ONSET: "When did the chest pain begin?" (Minutes, hours or days)     Off and on for a couple of months but seem to worsen one week ago. 4. PATTERN "Does the pain come and go, or has it been constant since it started?"  "Does it get worse with exertion?"      Intermittent-does not get worse with exertion 5. DURATION: "How long does it last" (e.g., seconds, minutes, hours)      6. SEVERITY: "How  bad is the pain?"  (e.g., Scale 1-10; mild, moderate, or severe)    - MILD (1-3): doesn't interfere with normal activities     - MODERATE (4-7): interferes with normal activities or awakens from sleep    - SEVERE (8-10): excruciating pain, unable to do any normal activities       Mild to severe. 7. CARDIAC RISK FACTORS: "Do you have any history of heart problems or risk factors for heart disease?" (e.g., prior heart attack, angina; high blood pressure, diabetes, being overweight, high cholesterol, smoking, or strong family history of heart disease)     Overweight. 8. PULMONARY RISK FACTORS: "Do you have any history of lung disease?"  (e.g., blood clots in lung, asthma, emphysema, birth control pills)     asthma 9. CAUSE: "What do you think is causing the chest pain?"    No idea 10. OTHER SYMPTOMS: "Do you have any other symptoms?" (e.g., dizziness, nausea, vomiting, sweating, fever, difficulty breathing, cough)      no 11. PREGNANCY: "Is there any chance you are pregnant?" "When was your last menstrual period?"       no  Protocols used: CHEST PAIN-A-AH

## 2018-10-17 ENCOUNTER — Encounter: Payer: Self-pay | Admitting: Obstetrics and Gynecology

## 2018-10-17 ENCOUNTER — Encounter: Payer: Self-pay | Admitting: Family Medicine

## 2018-10-20 ENCOUNTER — Telehealth: Payer: Self-pay | Admitting: Obstetrics and Gynecology

## 2018-10-20 ENCOUNTER — Other Ambulatory Visit: Payer: Self-pay

## 2018-10-20 ENCOUNTER — Encounter: Payer: Self-pay | Admitting: Obstetrics and Gynecology

## 2018-10-20 ENCOUNTER — Ambulatory Visit (INDEPENDENT_AMBULATORY_CARE_PROVIDER_SITE_OTHER): Payer: BLUE CROSS/BLUE SHIELD | Admitting: Obstetrics and Gynecology

## 2018-10-20 VITALS — BP 100/64 | HR 64 | Temp 98.4°F | Resp 16 | Wt 129.0 lb

## 2018-10-20 DIAGNOSIS — R102 Pelvic and perineal pain: Secondary | ICD-10-CM | POA: Diagnosis not present

## 2018-10-20 DIAGNOSIS — R350 Frequency of micturition: Secondary | ICD-10-CM | POA: Diagnosis not present

## 2018-10-20 DIAGNOSIS — R1900 Intra-abdominal and pelvic swelling, mass and lump, unspecified site: Secondary | ICD-10-CM

## 2018-10-20 LAB — POCT URINALYSIS DIPSTICK
Bilirubin, UA: NEGATIVE
Blood, UA: NEGATIVE
Glucose, UA: NEGATIVE
Ketones, UA: NEGATIVE
Leukocytes, UA: NEGATIVE
Nitrite, UA: NEGATIVE
Protein, UA: NEGATIVE
Urobilinogen, UA: NEGATIVE E.U./dL — AB
pH, UA: 5 (ref 5.0–8.0)

## 2018-10-20 LAB — POCT URINE PREGNANCY: Preg Test, Ur: NEGATIVE

## 2018-10-20 NOTE — Telephone Encounter (Signed)
Call reviewed with Dr. Edward Jolly, call returned to patient. Left detailed message, ok per dpr. Advised OV is recommended today for further evaluation, please return call to office to schedule.   If patient returns call to office and nurse not available, please schedule OV for today with Dr. Edward Jolly.

## 2018-10-20 NOTE — Telephone Encounter (Addendum)
Spoke with patient. Reports left sided pain that is worse with movement. Took OTC ibuprofen at 9am, pain improved, now 7/10. No contraceptive, female partner. Reports urinary frequency and dysuria. Symptoms started 1 wk ago.  Menses is late, LMP 09/06/18. No contraceptive, female partner. 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 Reports Hx of asthma. Covid 19 screening questions completed, negative for Covid 19 symptoms. Advised I will review with covering provider and return call, patient agreeable.

## 2018-10-20 NOTE — Telephone Encounter (Signed)
Patient sent the following correspondence through MyChart. Routing to triage to assist patient with request.  Hi dr Reyne Dumas so I have been having some pelvic pain I thought it was because of my cycle coming but I am late for my period and Im having pain on that left lower groin again but I noticed I have this urgent need to urinate a lot without even drinking and it hurts a little when I pee but there is no burning . Could it be a uti it has been happening for about 4 days now

## 2018-10-20 NOTE — Telephone Encounter (Signed)
Left message to call Virginio Isidore, RN at [facility] [phone].   

## 2018-10-20 NOTE — Telephone Encounter (Signed)
Patient is returning call to Jill, RN.  °

## 2018-10-20 NOTE — Progress Notes (Signed)
GYNECOLOGY  VISIT   HPI: 23 y.o.   Married  [place]  female   [id number] with Patient's last menstrual period was 09/02/2018 (exact date).   here for urinary frequency &left side pelvic pain.  Her skipped cycles are her greatest concern.  upt-neg poct urine-neg  Patient states this is the third or fourth time she has had this LLQ pain.  Not having much pain today, states her pain is a 6 or 7/10. She has had evaluation with [place] in the past and she has had signs of a hemorrhagic cyst.  Pain is aching and radiates to her left lower back.  Movement brings it on and it feels irritating and like pressure.  Pain can go away No nausea, vomiting, or diarrhea.  Having BMs ok.  States last BM was last hs.  Once or twice every 2 days.  She can go one whole week without a BM. She has increased fiber and water intake, and this is helping.  She takes a MVI.  Denies dysuria. She feels pulling with urination.   Takes ibuprofen or Tylenol for her pain.  She has asthma, so she was told to avoid ibuprofen.  Patient is married to a female, and she does not have sex with men.  She hopes to have a pregnancy in the future.  Not taking fertility medication currently, none for  5 - 6 months.  States her cycles are skipping since she did her fertility tx.  Gained some weight.  Stress from Covid 19 and dealing with a closing on her house.   She took Provera in the past to bring on her menses. She had a normal TSH and prolactin.   GYNECOLOGIC HISTORY: Patient's last menstrual period was 09/02/2018 (exact date). Contraception:  None, female partner Menopausal hormone therapy:  none Last mammogram:  none Last pap smear:  06-26-17 neg        OB History    Gravida  0   Para  0   Term  0   Preterm  0   AB  0   Living  0     SAB  0   TAB  0   Ectopic  0   Multiple  0   Live Births  0              Patient Active Problem List   Diagnosis Date Noted  . Seasonal allergies  09/22/2018  . Mild intermittent asthma with (acute) exacerbation 09/01/2018  . 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 Migraine with aura and without status migrainosus, not intractable 09/11/2012  . Variants of migraine, not elsewhere classified, without mention of intractable migraine without mention of status migrainosus 09/11/2012  . Unspecified constipation 09/11/2012  . Syncope 08/31/2010  . Neck Pain 08/31/2010  . Insomnia 08/31/2010    Past Medical History:  Diagnosis Date  . Allergic rhinitis   . Asthma   . Eczema   . Environmental allergies    Age 23 years  . Generalized headaches    Began at age 50 years  . Influenza    Age 43 years  . 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 Streptococcal infection(041.00)    Age 80 and 23 years old    Past Surgical History:  Procedure Laterality Date  . TONSILLECTOMY  2012  . WISDOM TOOTH EXTRACTION      Current Outpatient Medications  Medication Sig Dispense Refill  . acetaminophen (TYLENOL) 325 MG tablet Take 325  mg by mouth every 6 (six) hours as needed for fever.    Marland Kitchen. acetaminophen (TYLENOL) 500 MG tablet Take 2 tablets (1,000 mg total) by mouth every 6 (six) hours as needed. 30 tablet 0  . albuterol (PROVENTIL HFA;VENTOLIN HFA) 108 (90 Base) MCG/ACT inhaler Inhale 2 puffs into the lungs every 6 (six) hours as needed for wheezing or shortness of breath. 2 Inhaler 3  . fluticasone (FLOVENT HFA) 110 MCG/ACT inhaler Inhale 2 puffs into the lungs 2 (two) times daily. 1 Inhaler 2  . levocetirizine (XYZAL) 5 MG tablet Take 1 tablet (5 mg total) by mouth every evening. 30 tablet 3  . omeprazole (PRILOSEC) 20 MG capsule Take 1 capsule (20 mg total) by mouth daily. 14 capsule 0  . TURMERIC PO Take 1 capsule by mouth daily.     No current facility-administered medications for this visit.      ALLERGIES: Patient has no known allergies.  Family History  Problem Relation Age of Onset  . Migraines Paternal Grandmother   .  Migraines Paternal Grandfather   . HIV Father   . Diabetes Mother   . Hypertension Mother   . Heart disease Mother        No details  . Anemia Mother   . Anemia Sister   . Migraines Paternal Aunt   . Migraines Maternal Uncle        Maternal Great Uncle  . Diabetes Other        Family History  . Hypertension Other        Family History  . Depression Other        Family History  . Asthma Other        Family History  . Allergic rhinitis Other        Family History    Social History   Socioeconomic History  . Marital status: Married    Spouse name: Not on file  . Number of children: Not on file  . Years of education: Not on file  . Highest education level: Not on file  Occupational History  . Not on file  Social Needs  . Financial resource strain: Not on file  . Food insecurity:    Worry: Not on file    Inability: Not on file  . Transportation needs:    Medical: Not on file    Non-medical: Not on file  Tobacco Use  . Smoking status: Never Smoker  . Smokeless tobacco: Never Used  Substance and Sexual Activity  . Alcohol use: Not Currently  . Drug use: No  . Sexual activity: Not Currently    Partners: Female    Comment: female partner  Lifestyle  . Physical activity:    Days per week: Not on file    Minutes per session: Not on file  . Stress: Not on file  Relationships  . Social connections:    Talks on phone: Not on file    Gets together: Not on file    Attends religious service: Not on file    Active member of club or organization: Not on file    Attends meetings of clubs or organizations: Not on file    Relationship status: Not on file  . Intimate partner violence:    Fear of current or ex partner: Not on file    Emotionally abused: Not on file    Physically abused: Not on file    Forced sexual activity: Not on file  Other Topics Concern  .  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     Review of  Systems  Constitutional: Negative.   HENT: Negative.   Eyes: Negative.   Respiratory: Negative.   Cardiovascular: Negative.   Gastrointestinal: Negative.   Endocrine: Negative.   Genitourinary: Positive for frequency and pelvic pain.       Back pain  Musculoskeletal: Negative.   Skin: Negative.   Allergic/Immunologic: Negative.   Neurological: Negative.   Hematological: Negative.   Psychiatric/Behavioral: Negative.     PHYSICAL EXAMINATION:    BP 100/64   Pulse 64   Temp 98.4 F (36.9 C) (Oral)   Resp 16   Wt 129 lb (58.5 kg)   LMP 09/02/2018 (Exact Date)   BMI 26.96 kg/m     General appearance: alert, cooperative and appears stated age  Abdomen: soft, non-tender, no masses,  no organomegaly.  No guarding or rebound.   Pelvic: External genitalia:  no lesions              Urethra:  normal appearing urethra with no masses, tenderness or lesions              Bartholins and Skenes: normal                 Vagina: normal appearing vagina with normal color and discharge, no lesions              Cervix: no lesions                Bimanual Exam:  Uterus:  normal size, contour, position, consistency, mobility, non-tender              Adnexa:  Has left adnexal fullness and tenderness.  No mass, fullness, tenderness on right.              Chaperone was present for exam.  ASSESSMENT   LLQ pain.  It feels like she has an ovarian cyst today.  Constipation.  Skipped menses.    PLAN  Return for pelvic ultrasound this week. Signs and symptoms of torsion reviewed.  Treatment of constipation with dietary change, probiotics, stool softeners, Metamucil, and Miralax reviewed.  No Provera given today.    An After Visit Summary was printed and given to the patient.  __25____ minutes face to face time of which over 50% was spent in counseling.

## 2018-10-20 NOTE — Progress Notes (Signed)
Patient scheduled while in office for PUS on 10/23/18 at 10am, consult to follow at 10:30am with Dr. Oscar La. Order placed for precert. Patient verbalizes understanding and is agreeable.

## 2018-10-21 ENCOUNTER — Telehealth: Payer: Self-pay | Admitting: Obstetrics and Gynecology

## 2018-10-21 NOTE — Telephone Encounter (Signed)
Patient returned call. Reviewed benefits again for scheduled ultrasound on 10/23/2018. Patient understood information, is agreeable to information and has confirmed appointment. Patient is scheduled 10/23/2018 with Dr Oscar La. Patient is aware of the appointment date, arrival time and cancellation policy. No further questions  Will close encounter  Cc: Billie Ruddy, RN

## 2018-10-21 NOTE — Telephone Encounter (Signed)
Spoke with patient in regards to benefits for scheduled ultrasound appointment on 10/23/2018. Patient understood information presented. Patient states she will call back today to confirm. Advised patient to return call to office today and request to speak to, Billie Ruddy, RN nurse supervisor  Forwarding to Billie Ruddy, RN

## 2018-10-21 NOTE — Telephone Encounter (Signed)
Late entry, patient seen in office on 10/20/18.  Routing to provider for final review. Patient is agreeable to disposition. Will close encounter.

## 2018-10-23 ENCOUNTER — Encounter: Payer: Self-pay | Admitting: Obstetrics and Gynecology

## 2018-10-23 ENCOUNTER — Ambulatory Visit (INDEPENDENT_AMBULATORY_CARE_PROVIDER_SITE_OTHER): Payer: BLUE CROSS/BLUE SHIELD | Admitting: Obstetrics and Gynecology

## 2018-10-23 ENCOUNTER — Ambulatory Visit (INDEPENDENT_AMBULATORY_CARE_PROVIDER_SITE_OTHER): Payer: BLUE CROSS/BLUE SHIELD

## 2018-10-23 ENCOUNTER — Other Ambulatory Visit: Payer: Self-pay

## 2018-10-23 VITALS — BP 100/60 | HR 80 | Temp 98.4°F | Ht <= 58 in | Wt 131.0 lb

## 2018-10-23 DIAGNOSIS — R102 Pelvic and perineal pain unspecified side: Secondary | ICD-10-CM

## 2018-10-23 DIAGNOSIS — N914 Secondary oligomenorrhea: Secondary | ICD-10-CM

## 2018-10-23 DIAGNOSIS — R1900 Intra-abdominal and pelvic swelling, mass and lump, unspecified site: Secondary | ICD-10-CM | POA: Diagnosis not present

## 2018-10-23 DIAGNOSIS — N83202 Unspecified ovarian cyst, left side: Secondary | ICD-10-CM

## 2018-10-23 MED ORDER — MEDROXYPROGESTERONE ACETATE 5 MG PO TABS: ORAL_TABLET | ORAL | 1 refills | Status: DC

## 2018-10-23 NOTE — Progress Notes (Signed)
GYNECOLOGY  VISIT   HPI: 23 y.o.   Married [place] or [place] Not Hispanic or Latino  female Maria Smith with Patient's last menstrual period was 09/02/2018 (exact date).   here for pelvic US the patient has had issues with recurring left ovarian cysts. She had what seemed to be a ruptured cyst on the left in 12/9, then what seemed like a hemorrhagic cyst in 3/20. This current episode of pain started about a week ago. The pain got really bad on Sunday. The pain is worsened by movement. The pain has improved with her taking it easy. At the moment she doesn't have any pain. The exam she had the other day and the ultrasound today was very painful. Hurts to move around. Currently no pain if she isn't being active and no one is pushing on it. At the worst her pain was a 9/10 in severity.  She is working 2 jobs, moving at work is hurting.  She has Ibuprofen and tylenol for prn use.  She is late for her cycle, LMP 09/02/18 (no chance of pregnancy, has only female partner).  She was taking fertility meds in the fall, her partner is now going to try. They are closing on a house later this month.  Her cycles have been irregular since her infertility treatments. Normal TSH and prolactin in the fall. In the last 6 months cycles can be every 1-2 months, not predictable. Bleeds x 5 day, saturates a pad in 2-3 hours at most. Cramps are bad for one day (always), tolerable, no medication needed.   Mom just got out of the hospital s/p CVA.  She is under a lot of stress.      GYNECOLOGIC HISTORY: Patient's last menstrual period was 09/02/2018 (exact date).  Contraception: None Menopausal hormone therapy: none        OB History    Gravida  0   Para  0   Term  0   Preterm  0   AB  0   Living  0     SAB  0   TAB  0   Ectopic  0   Multiple  0   Live Births  0              Patient Active Problem List   Diagnosis Date Noted  . Seasonal allergies 09/22/2018  . Mild intermittent asthma  with (acute) exacerbation 09/01/2018  . 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 Migraine with aura and without status migrainosus, not intractable 09/11/2012  . Variants of migraine, not elsewhere classified, without mention of intractable migraine without mention of status migrainosus 09/11/2012  . Unspecified constipation 09/11/2012  . Syncope 08/31/2010  . Neck Pain 08/31/2010  . Insomnia 08/31/2010    Past Medical History:  Diagnosis Date  . Allergic rhinitis   . Asthma   . Eczema   . Environmental allergies    Age 89 years  . Generalized headaches    Began at age 23 years  . Influenza    Age 23 years  . 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 Streptococcal infection(041.00)    Age 23 and 23 years old    Past Surgical History:  Procedure Laterality Date  . TONSILLECTOMY  2012  . WISDOM TOOTH EXTRACTION      Current Outpatient Medications  Medication Sig Dispense Refill  . acetaminophen (TYLENOL) 325 MG tablet Take 325 mg by mouth every 6 (six) hours as needed for fever.    .Marland Kitchen  acetaminophen (TYLENOL) 500 MG tablet Take 2 tablets (1,000 mg total) by mouth every 6 (six) hours as needed. 30 tablet 0  . albuterol (PROVENTIL HFA;VENTOLIN HFA) 108 (90 Base) MCG/ACT inhaler Inhale 2 puffs into the lungs every 6 (six) hours as needed for wheezing or shortness of breath. 2 Inhaler 3  . fluticasone (FLOVENT HFA) 110 MCG/ACT inhaler Inhale 2 puffs into the lungs 2 (two) times daily. 1 Inhaler 2  . levocetirizine (XYZAL) 5 MG tablet Take 1 tablet (5 mg total) by mouth every evening. 30 tablet 3  . TURMERIC PO Take 1 capsule by mouth daily.     No current facility-administered medications for this visit.      ALLERGIES: Patient has no known allergies.  Family History  Problem Relation Age of Onset  . Migraines Paternal Grandmother   . Migraines Paternal Grandfather   . HIV Father   . Diabetes Mother   . Hypertension Mother   . Heart disease Mother        No details  .  Anemia Mother   . Anemia Sister   . Migraines Paternal Aunt   . Migraines Maternal Uncle        Maternal Great Uncle  . Diabetes Other        Family History  . Hypertension Other        Family History  . Depression Other        Family History  . Asthma Other        Family History  . Allergic rhinitis Other        Family History    Social History   Socioeconomic History  . Marital status: Married    Spouse name: Not on file  . Number of children: Not on file  . Years of education: Not on file  . Highest education level: Not on file  Occupational History  . Not on file  Social Needs  . Financial resource strain: Not on file  . Food insecurity:    Worry: Not on file    Inability: Not on file  . Transportation needs:    Medical: Not on file    Non-medical: Not on file  Tobacco Use  . Smoking status: Never Smoker  . Smokeless tobacco: Never Used  Substance and Sexual Activity  . Alcohol use: Not Currently  . Drug use: No  . Sexual activity: Not Currently    Partners: Female    Comment: female partner  Lifestyle  . Physical activity:    Days per week: Not on file    Minutes per session: Not on file  . Stress: Not on file  Relationships  . Social connections:    Talks on phone: Not on file    Gets together: Not on file    Attends religious service: Not on file    Active member of club or organization: Not on file    Attends meetings of clubs or organizations: Not on file    Relationship status: Not on file  . Intimate partner violence:    Fear of current or ex partner: Not on file    Emotionally abused: Not on file    Physically abused: Not on file    Forced sexual activity: Not on file  Other Topics Concern  . 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     Review of Systems  Constitutional: Negative.   HENT: Negative.  Eyes: Negative.   Respiratory: Negative.   Cardiovascular: Negative.    Gastrointestinal: Positive for abdominal pain.  Genitourinary: Negative.   Musculoskeletal: Negative.   Skin: Negative.   Neurological: Negative.   Endo/Heme/Allergies: Negative.   Psychiatric/Behavioral: Negative.   All other systems reviewed and are negative.   PHYSICAL EXAMINATION:    BP 100/60   Pulse 80   Temp 98.4 F (36.9 C) (Oral)   Ht 4\' 10"  (1.473 m)   Wt 131 lb (59.4 kg)   LMP 09/02/2018 (Exact Date)   BMI 27.38 kg/m     General appearance: alert, cooperative and appears stated age Abdomen: soft, non-tender; non distended, no masses,  no organomegaly  ASSESSMENT Pelvic pain for the last week, worse with movement.  3.4 cm complex left ovarian cyst with some surrounding free fluid, hemorrhagic vs endometrioma. Clinical picture is more c/w hemorrhagic cyst.  Irregular cycles since infertility treatment, normal labs in the fall.      PLAN Recommend she stay out of work until next week (note given) She will take Ibuprofen and tylenol as needed F/U ultrasound in 2 months, with annual exam Call if pain doesn't continue to improve Cyclic provera, take every other month if no spontaneous cycle She isn't a candidate for OCP's given migraine with aura, discussed depo-provera as an option to suppress ovulation Information on ovarian cysts and endometriosis given   An After Visit Summary was printed and given to the patient.  ~20 minutes face to face time of which over 50% was spent in counseling.

## 2018-10-23 NOTE — Patient Instructions (Signed)
Endometriosis  Endometriosis is a condition in which the tissue that lines the uterus (endometrium) grows outside of its normal location. The tissue may grow in many locations close to the uterus, but it commonly grows on the ovaries, fallopian tubes, vagina, or bowel. When the uterus sheds the endometrium every menstrual cycle, there is bleeding wherever the endometrial tissue is located. This can cause pain because blood is irritating to tissues that are not normally exposed to it. What are the causes? The cause of endometriosis is not known. What increases the risk? You may be more likely to develop endometriosis if you:  Have a family history of endometriosis.  Have never given birth.  Started your period at age 63 or younger.  Have high levels of estrogen in your body.  Were exposed to a certain medicine (diethylstilbestrol) before you were born (in utero).  Had low birth weight.  Were born as a twin, triplet, or other multiple.  Have a BMI of less than 25. BMI is an estimate of body fat and is calculated from height and weight. What are the signs or symptoms? Often, there are no symptoms of this condition. If you do have symptoms, they may:  Vary depending on where your endometrial tissue is growing.  Occur during your menstrual period (most common) or midcycle.  Come and go, or you may go months with no symptoms at all.  Stop with menopause. Symptoms may include:  Pain in the back or abdomen.  Heavier bleeding during periods.  Pain during sex.  Painful bowel movements.  Infertility.  Pelvic pain.  Bleeding more than once a month. How is this diagnosed? This condition is diagnosed based on your symptoms and a physical exam. You may have tests, such as:  Blood tests and urine tests. These may be done to help rule out other possible causes of your symptoms.  Ultrasound, to look for abnormal tissues.  An X-ray of the lower bowel (barium enema).  An  ultrasound that is done through the vagina (transvaginally).  CT scan.  MRI.  Laparoscopy. In this procedure, a lighted, pencil-sized instrument called a laparoscope is inserted into your abdomen through an incision. The laparoscope allows your health care provider to look at the organs inside your body and check for abnormal tissue to confirm the diagnosis. If abnormal tissue is found, your health care provider may remove a small piece of tissue (biopsy) to be examined under a microscope. How is this treated? Treatment for this condition may include:  Medicines to relieve pain, such as NSAIDs.  Hormone therapy. This involves using artificial (synthetic) hormones to reduce endometrial tissue growth. Your health care provider may recommend using a hormonal form of birth control, or other medicines.  Surgery. This may be done to remove abnormal endometrial tissue. ? In some cases, tissue may be removed using a laparoscope and a laser (laparoscopic laser treatment). ? In severe cases, surgery may be done to remove the fallopian tubes, uterus, and ovaries (hysterectomy). Follow these instructions at home:  Take over-the-counter and prescription medicines only as told by your health care provider.  Do not drive or use heavy machinery while taking prescription pain medicine.  Try to avoid activities that cause pain, including sexual activity.  Keep all follow-up visits as told by your health care provider. This is important. Contact a health care provider if:  You have pain in the area between your hip bones (pelvic area) that occurs: ? Before, during, or after your period. ?  In between your period and gets worse during your period. ? During or after sex. ? With bowel movements or urination, especially during your period.  You have problems getting pregnant.  You have a fever. Get help right away if:  You have severe pain that does not get better with medicine.  You have severe  nausea and vomiting, or you cannot eat without vomiting.  You have pain that affects only the lower, right side of your abdomen.  You have abdominal pain that gets worse.  You have abdominal swelling.  You have blood in your stool. This information is not intended to replace advice given to you by your health care provider. Make sure you discuss any questions you have with your health care provider. Document Released: 06/01/2000 Document Revised: 03/09/2016 Document Reviewed: 11/05/2015 Elsevier Interactive Patient Education  2019 [employer] Ovarian Cyst     An ovarian cyst is a fluid-filled sac that forms on an ovary. The ovaries are small organs that produce eggs in women. Various types of cysts can form on the ovaries. Some may cause symptoms and require treatment. Most ovarian cysts go away on their own, are not cancerous (are benign), and do not cause problems. Common types of ovarian cysts include:  Functional (follicle) cysts. ? Occur during the menstrual cycle, and usually go away with the next menstrual cycle if you do not get pregnant. ? Usually cause no symptoms.  Endometriomas. ? Are cysts that form from the tissue that lines the uterus (endometrium). ? Are sometimes called chocolate cysts because they become filled with blood that turns brown. ? Can cause pain in the lower abdomen during intercourse and during your period.  Cystadenoma cysts. ? Develop from cells on the outside surface of the ovary. ? Can get very large and cause lower abdomen pain and pain with intercourse. ? Can cause severe pain if they twist or break open (rupture).  Dermoid cysts. ? Are sometimes found in both ovaries. ? May contain different kinds of body tissue, such as skin, teeth, hair, or cartilage. ? Usually do not cause symptoms unless they get very big.  Theca lutein cysts. ? Occur when too much of a certain hormone (human chorionic gonadotropin) is produced and overstimulates  the ovaries to produce an egg. ? Are most common after having procedures used to assist with the conception of a baby (in vitro fertilization). What are the causes? Ovarian cysts may be caused by:  Ovarian hyperstimulation syndrome. This is a condition that can develop from taking fertility medicines. It causes multiple large ovarian cysts to form.  Polycystic ovarian syndrome (PCOS). This is a common hormonal disorder that can cause ovarian cysts, as well as problems with your period or fertility. What increases the risk? The following factors may make you more likely to develop ovarian cysts:  Being overweight or obese.  Taking fertility medicines.  Taking certain forms of hormonal birth control.  Smoking. What are the signs or symptoms? Many ovarian cysts do not cause symptoms. If symptoms are present, they may include:  Pelvic pain or pressure.  Pain in the lower abdomen.  Pain during sex.  Abdominal swelling.  Abnormal menstrual periods.  Increasing pain with menstrual periods. How is this diagnosed? These cysts are commonly found during a routine pelvic exam. You may have tests to find out more about the cyst, such as:  Ultrasound.  X-ray of the pelvis.  CT scan.  MRI.  Blood tests. How is this treated? Many ovarian  cysts go away on their own without treatment. Your health care provider may want to check your cyst regularly for 2-3 months to see if it changes. If you are in menopause, it is especially important to have your cyst monitored closely because menopausal women have a higher rate of ovarian cancer. When treatment is needed, it may include:  Medicines to help relieve pain.  A procedure to drain the cyst (aspiration).  Surgery to remove the whole cyst.  Hormone treatment or birth control pills. These methods are sometimes used to help dissolve a cyst. Follow these instructions at home:  Take over-the-counter and prescription medicines only as  told by your health care provider.  Do not drive or use heavy machinery while taking prescription pain medicine.  Get regular pelvic exams and Pap tests as often as told by your health care provider.  Return to your normal activities as told by your health care provider. Ask your health care provider what activities are safe for you.  Do not use any products that contain nicotine or tobacco, such as cigarettes and e-cigarettes. If you need help quitting, ask your health care provider.  Keep all follow-up visits as told by your health care provider. This is important. Contact a health care provider if:  Your periods are late, irregular, or painful, or they stop.  You have pelvic pain that does not go away.  You have pressure on your bladder or trouble emptying your bladder completely.  You have pain during sex.  You have any of the following in your abdomen: ? A feeling of fullness. ? Pressure. ? Discomfort. ? Pain that does not go away. ? Swelling.  You feel generally ill.  You become constipated.  You lose your appetite.  You develop severe acne.  You start to have more body hair and facial hair.  You are gaining weight or losing weight without changing your exercise and eating habits.  You think you may be pregnant. Get help right away if:  You have abdominal pain that is severe or gets worse.  You cannot eat or drink without vomiting.  You suddenly develop a fever.  Your menstrual period is much heavier than usual. This information is not intended to replace advice given to you by your health care provider. Make sure you discuss any questions you have with your health care provider. Document Released: 06/04/2005 Document Revised: 12/23/2015 Document Reviewed: 11/06/2015 Elsevier Interactive Patient Education  2019 [employer]

## 2018-10-30 ENCOUNTER — Telehealth: Payer: Self-pay | Admitting: Obstetrics and Gynecology

## 2018-10-30 DIAGNOSIS — H16223 Keratoconjunctivitis sicca, not specified as Sjogren's, bilateral: Secondary | ICD-10-CM | POA: Diagnosis not present

## 2018-10-30 NOTE — Telephone Encounter (Signed)
Patient has a question for a nurse regarding her not stating menses after taking medication.

## 2018-10-30 NOTE — Telephone Encounter (Signed)
Spoke with patient. Patient was seen on 10/23/2018 and given Provera 5 mg x 5 days for amenorrhea. Patient completed Provera on 10/27/2018. Asking when her cycle should start. Advised patient may take 7-14 days for menses to start. Advised to monitor until 5/25 and if no menses by then to contact the office. Patient is agreeable.  Routing to provider and will close encounter.

## 2018-11-08 ENCOUNTER — Encounter: Payer: Self-pay | Admitting: Family Medicine

## 2018-11-09 ENCOUNTER — Telehealth: Payer: BLUE CROSS/BLUE SHIELD | Admitting: Family

## 2018-11-09 DIAGNOSIS — B9689 Other specified bacterial agents as the cause of diseases classified elsewhere: Secondary | ICD-10-CM | POA: Diagnosis not present

## 2018-11-09 DIAGNOSIS — J019 Acute sinusitis, unspecified: Secondary | ICD-10-CM | POA: Diagnosis not present

## 2018-11-09 MED ORDER — AMOXICILLIN-POT CLAVULANATE 875-125 MG PO TABS: 1.0000 | ORAL_TABLET | Freq: Two times a day (BID) | ORAL | 0 refills | Status: DC

## 2018-11-09 NOTE — Progress Notes (Signed)

## 2018-11-09 NOTE — Progress Notes (Signed)
Greater than 5 minutes, yet less than 10 minutes of time have been spent researching, coordinating, and implementing care for this patient today.  Thank you for the details you included in the comment boxes. Those details are very helpful in determining the best course of treatment for you and help us to provide the best care.  

## 2018-11-10 ENCOUNTER — Emergency Department (HOSPITAL_COMMUNITY)
Admission: EM | Admit: 2018-11-10 | Discharge: 2018-11-10 | Disposition: A | Discharge status: Home / Self Care | Payer: BLUE CROSS/BLUE SHIELD | Attending: Emergency Medicine | Admitting: Emergency Medicine

## 2018-11-10 ENCOUNTER — Emergency Department (HOSPITAL_COMMUNITY): Payer: BLUE CROSS/BLUE SHIELD

## 2018-11-10 ENCOUNTER — Encounter (HOSPITAL_COMMUNITY): Payer: Self-pay | Admitting: *Deleted

## 2018-11-10 ENCOUNTER — Other Ambulatory Visit: Payer: Self-pay

## 2018-11-10 DIAGNOSIS — J45909 Unspecified asthma, uncomplicated: Secondary | ICD-10-CM | POA: Insufficient documentation

## 2018-11-10 DIAGNOSIS — N83209 Unspecified ovarian cyst, unspecified side: Secondary | ICD-10-CM | POA: Diagnosis not present

## 2018-11-10 DIAGNOSIS — R102 Pelvic and perineal pain: Secondary | ICD-10-CM | POA: Diagnosis not present

## 2018-11-10 LAB — URINALYSIS, ROUTINE W REFLEX MICROSCOPIC
Bilirubin Urine: NEGATIVE
Glucose, UA: NEGATIVE mg/dL
Hgb urine dipstick: NEGATIVE
Ketones, ur: NEGATIVE mg/dL
Leukocytes,Ua: NEGATIVE
Nitrite: NEGATIVE
Protein, ur: NEGATIVE mg/dL
Specific Gravity, Urine: 1.004 — ABNORMAL LOW (ref 1.005–1.030)
pH: 7 (ref 5.0–8.0)

## 2018-11-10 LAB — CBC
HCT: 40.5 % (ref 36.0–46.0)
Hemoglobin: 13.2 g/dL (ref 12.0–15.0)
MCH: 28.5 pg (ref 26.0–34.0)
MCHC: 32.6 g/dL (ref 30.0–36.0)
MCV: 87.5 fL (ref 80.0–100.0)
Platelets: 335 10*3/uL (ref 150–400)
RBC: 4.63 MIL/uL (ref 3.87–5.11)
RDW: 12.2 % (ref 11.5–15.5)
WBC: 6.1 10*3/uL (ref 4.0–10.5)
nRBC: 0 % (ref 0.0–0.2)

## 2018-11-10 LAB — COMPREHENSIVE METABOLIC PANEL
ALT: 22 U/L (ref 0–44)
AST: 26 U/L (ref 15–41)
Albumin: 4 g/dL (ref 3.5–5.0)
Alkaline Phosphatase: 56 U/L (ref 38–126)
Anion gap: 10 (ref 5–15)
BUN: 8 mg/dL (ref 6–20)
CO2: 25 mmol/L (ref 22–32)
Calcium: 9.6 mg/dL (ref 8.9–10.3)
Chloride: 104 mmol/L (ref 98–111)
Creatinine, Ser: 0.96 mg/dL (ref 0.44–1.00)
GFR calc Af Amer: >60 mL/min (ref >60)
GFR calc non Af Amer: >60 mL/min (ref >60)
Glucose, Bld: 98 mg/dL (ref 70–99)
Potassium: 4.1 mmol/L (ref 3.5–5.1)
Sodium: 139 mmol/L (ref 135–145)
Total Bilirubin: 0.4 mg/dL (ref 0.3–1.2)
Total Protein: 7.1 g/dL (ref 6.5–8.1)

## 2018-11-10 LAB — LIPASE, BLOOD: Lipase: 52 U/L — ABNORMAL HIGH (ref 11–51)

## 2018-11-10 LAB — I-STAT BETA HCG BLOOD, ED (MC, WL, AP ONLY): I-stat hCG, quantitative: <5 m[IU]/mL (ref <5)

## 2018-11-10 MED ORDER — IBUPROFEN 600 MG PO TABS: 600.0000 mg | ORAL_TABLET | Freq: Four times a day (QID) | ORAL | 0 refills | Status: DC | PRN

## 2018-11-10 MED ORDER — MORPHINE SULFATE (PF) 4 MG/ML IV SOLN
4.0000 mg | Freq: Once | INTRAVENOUS | Status: AC
  Administered 2018-11-10: 17:00:00 4 mg via INTRAVENOUS
  Filled 2018-11-10: qty 1

## 2018-11-10 MED ORDER — HYDROCODONE-ACETAMINOPHEN 5-325 MG PO TABS: 1.0000 | ORAL_TABLET | Freq: Three times a day (TID) | ORAL | 0 refills | Status: DC | PRN

## 2018-11-10 MED ORDER — MORPHINE SULFATE (PF) 4 MG/ML IV SOLN
4.0000 mg | Freq: Once | INTRAVENOUS | Status: AC
  Administered 2018-11-10: 18:00:00 4 mg via INTRAVENOUS
  Filled 2018-11-10: qty 1

## 2018-11-10 MED ORDER — ONDANSETRON HCL 4 MG/2ML IJ SOLN
4.0000 mg | Freq: Once | INTRAMUSCULAR | Status: AC
  Administered 2018-11-10: 17:00:00 4 mg via INTRAVENOUS
  Filled 2018-11-10: qty 2

## 2018-11-10 MED ORDER — SODIUM CHLORIDE 0.9 % IV BOLUS
500.0000 mL | Freq: Once | INTRAVENOUS | Status: AC
  Administered 2018-11-10: 17:00:00 500 mL via INTRAVENOUS

## 2018-11-10 MED ORDER — SODIUM CHLORIDE 0.9% FLUSH: 3.0000 mL | Freq: Once | INTRAVENOUS | Status: DC

## 2018-11-10 NOTE — ED Triage Notes (Signed)
Pt states was seen by ob 2 weeks ago and told she had a L ovarian cyst 3 cm.  Today acute onset L abdominal pain 10/10.  Denies urinary s/s or vag discharge.

## 2018-11-10 NOTE — Discharge Instructions (Signed)
You may take 400-600 mg ibuprofen for your pain. If you have breakthrough pain you may take on norco every 8 hours. Take medication as directed and do not operate machinery, drive a car, or work while taking this medication as it can make you drowsy.   Please call your OB/GYN tomorrow morning to make an appointment for follow-up.  You will need to return to the emergency department immediately if you have any abdominal swelling/bloating, firmness to the abdomen, diffuse abdominal tenderness or worsening abdominal pain, intractable vomiting or fevers.

## 2018-11-10 NOTE — ED Notes (Signed)
IV removed.

## 2018-11-10 NOTE — ED Notes (Signed)
Patient verbalizes understanding of discharge instructions. Opportunity for questioning and answers were provided. Armband removed by staff, pt discharged from ED via wheelchair to home.  

## 2018-11-10 NOTE — ED Provider Notes (Signed)
[facility] Provider Note   CSN: [id number] Arrival date & time: 11/10/18  1431    History   Chief Complaint Chief Complaint  Patient presents with   Abdominal Pain    poss ovarian cyst    HPI Maria Smith is a 23 y.o. female.     HPI   Patient is a 23 year old female with a history of asthma, eczema, who presents to the emergency department today for evaluation of left pelvic pain that began at about 1:00 today.  States that she has had mild pain to this area and was recently diagnosed with a 3 cm left ovarian cyst by her OB 2 weeks ago.  States that today the pain became much more severe.  Rates pain 10/10.  Pain feels sharp.  It is worse with palpation and when standing.  She tried taking Tylenol at home without relief.  Denies any fevers, nausea, vomiting, diarrhea.  States she was having urinary frequency 2 weeks ago but this is since resolved.  Denies dysuria or hematuria.  No vaginal bleeding or discharge.  She does note that she is sexually active with her wife and they are monogamous.  She denies any concern for sexually transmitted infections.  She declines STD testing.  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   Migraine with aura and without status migrainosus, not intractable 09/11/2012   Variants of migraine, not elsewhere classified, without mention of intractable migraine without mention of status migrainosus 09/11/2012   Unspecified constipation  09/11/2012   Syncope 08/31/2010   Neck Pain 08/31/2010   Insomnia 08/31/2010    Past Surgical History:  Procedure Laterality Date   TONSILLECTOMY  2012   WISDOM TOOTH EXTRACTION       OB History    Gravida  0   Para  0   Term  0   Preterm  0   AB  0   Living  0     SAB  0   TAB  0   Ectopic  0   Multiple  0   Live Births  0            Home Medications    Prior to Admission medications   Medication Sig Start Date End Date Taking? Authorizing Provider  acetaminophen (TYLENOL) 500 MG tablet Take 2 tablets (1,000 mg total) by mouth every 6 (six) hours as needed. 10/05/18  Yes Arby BarrettePfeiffer, Marcy, MD  albuterol (PROVENTIL HFA;VENTOLIN HFA) 108 (90 Base) MCG/ACT inhaler Inhale 2 puffs into the lungs every 6 (six) hours as needed for wheezing or shortness of breath. 09/01/18  Yes Sharlene DoryWendling, Nicholas Paul, DO  amoxicillin-clavulanate (AUGMENTIN) 875-125 MG tablet Take 1 tablet by mouth 2 (two) times daily. 11/09/18  Yes Worthy RancherWebb, Padonda B, FNP  TURMERIC PO Take 1 capsule by mouth daily.   Yes [provider]  HYDROcodone-acetaminophen (NORCO/VICODIN) 5-325 MG tablet Take 1  tablet by mouth every 8 (eight) hours as needed. 11/10/18   Rip Hawes S, PA-C  ibuprofen (ADVIL) 600 MG tablet Take 1 tablet (600 mg total) by mouth every 6 (six) hours as needed. 11/10/18   Aubrina Nieman S, PA-C    Family History Family History  Problem Relation Age of Onset   Migraines Paternal Grandmother    Migraines Paternal Grandfather    HIV Father    Diabetes Mother    Hypertension Mother    Heart disease Mother        No details   Anemia Mother    Anemia Sister    Migraines Paternal Aunt    Migraines Maternal Uncle        Maternal Great Uncle   Diabetes Other        Family History   Hypertension Other        Family History   Depression Other        Family History   Asthma Other        Family History   Allergic rhinitis Other        Family History     Social History Social History   Tobacco Use   Smoking status: Never Smoker   Smokeless tobacco: Never Used  Substance Use Topics   Alcohol use: Not Currently   Drug use: No     Allergies   Patient has no known allergies.   Review of Systems Review of Systems  Constitutional: Negative for fever.  HENT: Negative for sore throat.   Eyes: Negative for visual disturbance.  Respiratory: Negative for cough and shortness of breath.   Cardiovascular: Negative for chest pain.  Gastrointestinal: Negative for abdominal pain, constipation, diarrhea, nausea and vomiting.  Genitourinary: Positive for pelvic pain. Negative for dysuria, flank pain, frequency, hematuria, urgency, vaginal bleeding and vaginal discharge.  Musculoskeletal: Negative for back pain.  Skin: Negative for color change and rash.  Neurological: Negative for headaches.  All other systems reviewed and are negative.    Physical Exam Updated Vital Signs BP (!) 134/94 (BP Location: Right Arm)    Pulse 93    Temp 99.1 F (37.3 C) (Oral)    Resp (!) 22    Ht  (1.473 m)    Wt 58.5 kg    LMP 11/01/2018    SpO2 97%    BMI 26.96 kg/m   Physical Exam Vitals signs and nursing note reviewed.  Constitutional:      General: She is not in acute distress.    Appearance: She is well-developed.  HENT:     Head: Normocephalic and atraumatic.  Eyes:     Conjunctiva/sclera: Conjunctivae normal.  Neck:     Musculoskeletal: Neck supple.  Cardiovascular:     Rate and Rhythm: Normal rate and regular rhythm.     Heart sounds: No murmur.  Pulmonary:     Effort: Pulmonary effort is normal. No respiratory distress.     Breath sounds: Normal breath sounds.  Abdominal:     General: Bowel sounds are normal.     Palpations: Abdomen is soft.     Tenderness: There is abdominal tenderness in the left lower quadrant. There is no right CVA tenderness, left CVA tenderness, guarding or rebound.  Genitourinary:    Comments: Exam  performed by Karrie Meres,  exam chaperoned Date: 11/10/2018 Pelvic exam: normal external genitalia without evidence of trauma. VULVA: normal appearing vulva with no masses, tenderness or lesion. VAGINA: normal appearing vagina with  normal color and discharge, no lesions. CERVIX: normal appearing cervix without lesions, cervical motion tenderness absent, cervical os closed with out purulent discharge, Wet prep and DNA probe for chlamydia and GC obtained.   ADNEXA: normal adnexa in size and no masses, severe left adnexal TTP UTERUS: uterus is normal size, shape, consistency and nontender.  Skin:    General: Skin is warm and dry.  Neurological:     Mental Status: She is alert.      ED Treatments / Results  Labs (all labs ordered are listed, but only abnormal results are displayed) Labs Reviewed  LIPASE, BLOOD - Abnormal; Notable for the following components:      Result Value   Lipase 52 (*)    All other components within normal limits  URINALYSIS, ROUTINE W REFLEX MICROSCOPIC - Abnormal; Notable for the following components:   Color, Urine STRAW (*)    Specific Gravity, Urine 1.004 (*)    All other components within normal limits  COMPREHENSIVE METABOLIC PANEL  CBC  I-STAT BETA HCG BLOOD, ED ([facility], [facility], [facility] ONLY)    EKG None  Radiology US Transvaginal Non-ob  Result Date: 11/10/2018 CLINICAL DATA:  Left adnexal region pain EXAM: TRANSABDOMINAL AND TRANSVAGINAL ULTRASOUND OF PELVIS DOPPLER ULTRASOUND OF OVARIES TECHNIQUE: Study was performed transabdominally to optimize pelvic field of view evaluation and transvaginally to optimize internal visceral architecture evaluation. Color and duplex Doppler ultrasound was utilized to evaluate blood flow to the ovaries. COMPARISON:  Oct 23, 2018 and August 21, 2018 FINDINGS: Uterus Measurements: 6.2 x 2.7 x 3.0 cm = volume: 26.3 mL. No fibroids or other mass visualized. Endometrium Thickness: 12 mm.  No focal abnormality visualized. Right  ovary Measurements: 3.8 x 2.6 x 2.2 cm = volume: 11.1 mL. Normal appearance/no adnexal mass. Several physiologic follicles are noted in the right ovary. Left ovary Measurements: 4.2 x 1.6 x 2.7 cm = volume: 9.6 mL. Normal appearance/no adnexal mass. Physiologic follicles noted. Pulsed Doppler evaluation of both ovaries demonstrates normal low-resistance arterial and venous waveforms. Other findings Small amount of free fluid. IMPRESSION: 1. Previously noted ovarian cyst on the left is no longer evident. A small amount of fluid in the cul-de-sac may indicate recent ovarian cyst rupture. Currently no pelvic masses beyond physiologic follicles in each ovary. 2.  No demonstrable ovarian torsion on either side. 3.  Uterus and endometrium appear within normal limits. Electronically Signed   By: Bretta Bang III M.D.   On: 11/10/2018 17:16   US Pelvis Complete  Result Date: 11/10/2018 CLINICAL DATA:  Left adnexal region pain EXAM: TRANSABDOMINAL AND TRANSVAGINAL ULTRASOUND OF PELVIS DOPPLER ULTRASOUND OF OVARIES TECHNIQUE: Study was performed transabdominally to optimize pelvic field of view evaluation and transvaginally to optimize internal visceral architecture evaluation. Color and duplex Doppler ultrasound was utilized to evaluate blood flow to the ovaries. COMPARISON:  Oct 23, 2018 and August 21, 2018 FINDINGS: Uterus Measurements: 6.2 x 2.7 x 3.0 cm = volume: 26.3 mL. No fibroids or other mass visualized. Endometrium Thickness: 12 mm.  No focal abnormality visualized. Right ovary Measurements: 3.8 x 2.6 x 2.2 cm = volume: 11.1 mL. Normal appearance/no adnexal mass. Several physiologic follicles are noted in the right ovary. Left ovary Measurements: 4.2 x 1.6 x 2.7 cm = volume: 9.6 mL. Normal appearance/no adnexal mass. Physiologic follicles noted. Pulsed Doppler evaluation of both ovaries demonstrates normal low-resistance arterial and venous waveforms. Other findings Small amount of free fluid. IMPRESSION: 1.  Previously noted ovarian cyst on the left is  no longer evident. A small amount of fluid in the cul-de-sac may indicate recent ovarian cyst rupture. Currently no pelvic masses beyond physiologic follicles in each ovary. 2.  No demonstrable ovarian torsion on either side. 3.  Uterus and endometrium appear within normal limits. Electronically Signed   By: Bretta Bang III M.D.   On: 11/10/2018 17:16   [place] Art/ven Flow Abd Pelv Doppler  Result Date: 11/10/2018 CLINICAL DATA:  Left adnexal region pain EXAM: TRANSABDOMINAL AND TRANSVAGINAL ULTRASOUND OF PELVIS DOPPLER ULTRASOUND OF OVARIES TECHNIQUE: Study was performed transabdominally to optimize pelvic field of view evaluation and transvaginally to optimize internal visceral architecture evaluation. Color and duplex Doppler ultrasound was utilized to evaluate blood flow to the ovaries. COMPARISON:  Oct 23, 2018 and August 21, 2018 FINDINGS: Uterus Measurements: 6.2 x 2.7 x 3.0 cm = volume: 26.3 mL. No fibroids or other mass visualized. Endometrium Thickness: 12 mm.  No focal abnormality visualized. Right ovary Measurements: 3.8 x 2.6 x 2.2 cm = volume: 11.1 mL. Normal appearance/no adnexal mass. Several physiologic follicles are noted in the right ovary. Left ovary Measurements: 4.2 x 1.6 x 2.7 cm = volume: 9.6 mL. Normal appearance/no adnexal mass. Physiologic follicles noted. Pulsed Doppler evaluation of both ovaries demonstrates normal low-resistance arterial and venous waveforms. Other findings Small amount of free fluid. IMPRESSION: 1. Previously noted ovarian cyst on the left is no longer evident. A small amount of fluid in the cul-de-sac may indicate recent ovarian cyst rupture. Currently no pelvic masses beyond physiologic follicles in each ovary. 2.  No demonstrable ovarian torsion on either side. 3.  Uterus and endometrium appear within normal limits. Electronically Signed   By: Bretta Bang III M.D.   On: 11/10/2018 17:16     Procedures Procedures (including critical care time)  Medications Ordered in ED Medications  sodium chloride flush (NS) 0.9 % injection 3 mL (3 mLs Intravenous Not Given 11/10/18 1628)  morphine 4 MG/ML injection 4 mg (4 mg Intravenous Given 11/10/18 1705)  ondansetron (ZOFRAN) injection 4 mg (4 mg Intravenous Given 11/10/18 1705)  sodium chloride 0.9 % bolus 500 mL (0 mLs Intravenous Stopped 11/10/18 1806)  morphine 4 MG/ML injection 4 mg (4 mg Intravenous Given 11/10/18 1809)     Initial Impression / Assessment and Plan / ED Course  I have reviewed the triage vital signs and the nursing notes.  Pertinent labs & imaging results that were available during my care of the patient were reviewed by me and considered in my medical decision making (see chart for details).     Final Clinical Impressions(s) / ED Diagnoses   Final diagnoses:  Ruptured ovarian cyst   23 year old female with known 3 cm left ovarian cyst presenting for left pelvic pain that began suddenly about 3 hours prior to arrival.  Has left suprapubic tenderness left lower quadrant abdominal tenderness on exam.  On pelvic exam patient has left adnexal tenderness.  No fullness to the left adnexa.  No uterine tenderness or right adnexal tenderness.  No cervical motion tenderness.  Patient declined wet prep and GC chlamydia testing.  CBC without anemia or leukocytosis. CMP and lipase are reassuring Beta-hCG is negative UA does not show any evidence of urinary tract infection.  Pelvic ultrasound ordered due to concern for torsion or ruptured ovarian cyst, which showed previously noted ovarian cyst on the left is no longer evident. A small amount of fluid in the cul-de-sac may indicate recent ovarian cyst rupture. Currently no pelvic masses beyond physiologic  follicles in each ovary.  No demonstrable ovarian torsion on either side. Uterus and endometrium appear within normal limits.  On reassessment after pain medications,  patient states that her pain has largely improved.  She has minimal tenderness to the left suprapubic area.  There is no rebound tenderness.  No guarding, no rigidity to the abdomen.  No tenderness to the remainder of the abdomen.  Discussed findings and plan for discharge with pain medications.  Advised her to contact her OB/GYN tomorrow to make an appointment for follow-up and to return to the ER for new or worsening symptoms.  She voices understanding of the plan and reasons to return.  All questions answered.  Patient stable for discharge.  ED Discharge Orders         Ordered    HYDROcodone-acetaminophen (NORCO/VICODIN) 5-325 MG tablet  Every 8 hours PRN     11/10/18 1842    ibuprofen (ADVIL) 600 MG tablet  Every 6 hours PRN     11/10/18 1842           Karrie Meres, PA-C 11/10/18 1845    Terrilee Files, MD 11/11/18 1152

## 2018-11-11 ENCOUNTER — Encounter: Payer: Self-pay | Admitting: Obstetrics and Gynecology

## 2018-11-11 ENCOUNTER — Telehealth: Payer: Self-pay | Admitting: Obstetrics and Gynecology

## 2018-11-11 ENCOUNTER — Ambulatory Visit (INDEPENDENT_AMBULATORY_CARE_PROVIDER_SITE_OTHER): Payer: BLUE CROSS/BLUE SHIELD | Admitting: Obstetrics and Gynecology

## 2018-11-11 VITALS — BP 110/82 | HR 80 | Temp 98.7°F | Wt 129.0 lb

## 2018-11-11 DIAGNOSIS — R102 Pelvic and perineal pain: Secondary | ICD-10-CM

## 2018-11-11 DIAGNOSIS — E282 Polycystic ovarian syndrome: Secondary | ICD-10-CM | POA: Insufficient documentation

## 2018-11-11 DIAGNOSIS — N83209 Unspecified ovarian cyst, unspecified side: Secondary | ICD-10-CM

## 2018-11-11 NOTE — Patient Instructions (Signed)
Polycystic Ovarian Syndrome    Polycystic ovarian syndrome (PCOS) is a common hormonal disorder among women of reproductive age. In most women with PCOS, many small fluid-filled sacs (cysts) grow on the ovaries, and the cysts are not part of a normal menstrual cycle. PCOS can cause problems with your menstrual periods and make it difficult to get pregnant. It can also cause an increased risk of miscarriage with pregnancy. If it is not treated, PCOS can lead to serious health problems, such as diabetes and heart disease.  What are the causes?  The cause of PCOS is not known, but it may be the result of a combination of certain factors, such as:  · Irregular menstrual cycle.  · High levels of certain hormones (androgens).  · Problems with the hormone that helps to control blood sugar (insulin resistance).  · Certain genes.  What increases the risk?  This condition is more likely to develop in women who have a family history of PCOS.  What are the signs or symptoms?  Symptoms of PCOS may include:  · Multiple ovarian cysts.  · Infrequent periods or no periods.  · Periods that are too frequent or too heavy.  · Unpredictable periods.  · Inability to get pregnant (infertility) because of not ovulating.  · Increased growth of hair on the face, chest, stomach, back, thumbs, thighs, or toes.  · Acne or oily skin. Acne may develop during adulthood, and it may not respond to treatment.  · Pelvic pain.  · Weight gain or obesity.  · Patches of thickened and dark brown or black skin on the neck, arms, breasts, or thighs (acanthosis nigricans).  · Excess hair growth on the face, chest, abdomen, or upper thighs (hirsutism).  How is this diagnosed?  This condition is diagnosed based on:  · Your medical history.  · A physical exam, including a pelvic exam. Your health care provider may look for areas of increased hair growth on your skin.  · Tests, such as:  ? Ultrasound. This may be used to examine the ovaries and the lining of the  uterus (endometrium) for cysts.  ? Blood tests. These may be used to check levels of sugar (glucose), female hormone (testosterone), and female hormones (estrogen and progesterone) in your blood.  How is this treated?  There is no cure for PCOS, but treatment can help to manage symptoms and prevent more health problems from developing. Treatment varies depending on:  · Your symptoms.  · Whether you want to have a baby or whether you need birth control (contraception).  Treatment may include nutrition and lifestyle changes along with:  · Progesterone hormone to start a menstrual period.  · Birth control pills to help you have regular menstrual periods.  · Medicines to make you ovulate, if you want to get pregnant.  · Medicine to reduce excessive hair growth.  · Surgery, in severe cases. This may involve making small holes in one or both of your ovaries. This decreases the amount of testosterone that your body produces.  Follow these instructions at home:  · Take over-the-counter and prescription medicines only as told by your health care provider.  · Follow a healthy meal plan. This can help you reduce the effects of PCOS.  ? Eat a healthy diet that includes lean proteins, complex carbohydrates, fresh fruits and vegetables, low-fat dairy products, and healthy fats. Make sure to eat enough fiber.  · If you are overweight, lose weight as told by your health care   provider.  ? Losing 10% of your body weight may improve symptoms.  ? Your health care provider can determine how much weight loss is best for you and can help you lose weight safely.  · Keep all follow-up visits as told by your health care provider. This is important.  Contact a health care provider if:  · Your symptoms do not get better with medicine.  · You develop new symptoms.  This information is not intended to replace advice given to you by your health care provider. Make sure you discuss any questions you have with your health care provider.  Document  Released: 09/28/2004 Document Revised: 01/31/2016 Document Reviewed: 11/20/2015  Elsevier Interactive Patient Education © 2019 [employer]

## 2018-11-11 NOTE — Telephone Encounter (Signed)
Called patient to schedule AEX with Dr.Jertson in 4 weeks. Made appointment on 12-17-2018 at 10:00 to arrive at 9:45--left detailed message on voicemail per DPR. Asked patient to call if this date was not good for her.

## 2018-11-11 NOTE — Telephone Encounter (Signed)
Spoke with patient. Patient seen at [facility] ER on 5/25 for ruptured ovarian cyst, calling to schedule f/u. Patient reports headache and bloating, is currently on abx for "sinusitis", started on 5/24. Denies pelvic pain, fever/chills, N/V. Covid 19 pre-screening negative. OV scheduled for today at 3:30pm with Dr. Oscar La.   Routing to provider for final review. Patient is agreeable to disposition. Will close encounter.

## 2018-11-11 NOTE — Progress Notes (Signed)
GYNECOLOGY  VISIT   HPI: 23 y.o.   Married Black or [place] American Not Hispanic or Latino  female   [id number] with Patient's last menstrual period was 11/01/2018.The patient was seen here earlier this month with pelvic pain and a 3.4 cm complex left ovarian cyst (hemorrhagic vs endometrioma).    Yesterday the patient was seen in the ER with pelvic pain. Ultrasound showed resolution of her prior cyst. There was a small amount of free fluid "may indicate recent ovarian cyst rupture). She is here for follow up on ruptured ovarian cyst. Pain was 10/10 yesterday. Seen in the ER for evaluation. Patient is taking hydrocodone as needed. Pain is now a 5/10. Pain is sharp in the left pelvis. Pain is intermittent. If she is still she doesn't hurt, if she moves she is more uncomfortable.  CBC was normal yesterday.  H/O oligomenorrhea, using cyclic provera. Normal TSH, prolactin. No acne or hirsutism.    GYNECOLOGIC HISTORY: Patient's last menstrual period was 11/01/2018. Contraception:None Menopausal hormone therapy: None        OB History    Gravida  0   Para  0   Term  0   Preterm  0   AB  0   Living  0     SAB  0   TAB  0   Ectopic  0   Multiple  0   Live Births  0              Patient Active Problem List   Diagnosis Date Noted  . Seasonal allergies 09/22/2018  . Mild intermittent asthma with (acute) exacerbation 09/01/2018  . 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 Migraine with aura and without status migrainosus, not intractable 09/11/2012  . Variants of migraine, not elsewhere classified, without mention of intractable migraine without mention of status migrainosus 09/11/2012  . Unspecified constipation 09/11/2012  . Syncope 08/31/2010  . Neck Pain 08/31/2010  . Insomnia 08/31/2010    Past Medical History:  Diagnosis Date  . Allergic rhinitis   . Asthma   . Eczema   . Environmental allergies    Age 85 years  . Generalized headaches    Began at age 23 years  . Influenza     Age 17 years  . 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 Streptococcal infection(041.00)    Age 23 and 23 years old    Past Surgical History:  Procedure Laterality Date  . TONSILLECTOMY  2012  . WISDOM TOOTH EXTRACTION      Current Outpatient Medications  Medication Sig Dispense Refill  . albuterol (PROVENTIL HFA;VENTOLIN HFA) 108 (90 Base) MCG/ACT inhaler Inhale 2 puffs into the lungs every 6 (six) hours as needed for wheezing or shortness of breath. 2 Inhaler 3  . amoxicillin-clavulanate (AUGMENTIN) 875-125 MG tablet Take 1 tablet by mouth 2 (two) times daily. 14 tablet 0  . HYDROcodone-acetaminophen (NORCO/VICODIN) 5-325 MG tablet Take 1 tablet by mouth every 8 (eight) hours as needed. 6 tablet 0  . ibuprofen (ADVIL) 600 MG tablet Take 1 tablet (600 mg total) by mouth every 6 (six) hours as needed. 30 tablet 0   No current facility-administered medications for this visit.      ALLERGIES: Patient has no known allergies.  Family History  Problem Relation Age of Onset  . Migraines Paternal Grandmother   . Migraines Paternal Grandfather   . HIV Father   . Diabetes Mother   . Hypertension Mother   . Heart  disease Mother        No details  . Anemia Mother   . Anemia Sister   . Migraines Paternal Aunt   . Migraines Maternal Uncle        Maternal Great Uncle  . Diabetes Other        Family History  . Hypertension Other        Family History  . Depression Other        Family History  . Asthma Other        Family History  . Allergic rhinitis Other        Family History    Social History   Socioeconomic History  . Marital status: Married    Spouse name: Not on file  . Number of children: Not on file  . Years of education: Not on file  . Highest education level: Not on file  Occupational History  . Not on file  Social Needs  . Financial resource strain: Not on file  . Food insecurity:    Worry: Not on file    Inability: Not on file  .  Transportation needs:    Medical: Not on file    Non-medical: Not on file  Tobacco Use  . Smoking status: Never Smoker  . Smokeless tobacco: Never Used  Substance and Sexual Activity  . Alcohol use: Not Currently  . Drug use: No  . Sexual activity: Not Currently    Partners: Female    Comment: female partner  Lifestyle  . Physical activity:    Days per week: Not on file    Minutes per session: Not on file  . Stress: Not on file  Relationships  . Social connections:    Talks on phone: Not on file    Gets together: Not on file    Attends religious service: Not on file    Active member of club or organization: Not on file    Attends meetings of clubs or organizations: Not on file    Relationship status: Not on file  . Intimate partner violence:    Fear of current or ex partner: Not on file    Emotionally abused: Not on file    Physically abused: Not on file    Forced sexual activity: Not on file  Other Topics Concern  . 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     Review of Systems  Constitutional: Negative.   HENT: Negative.   Eyes: Negative.   Respiratory: Negative.   Cardiovascular: Negative.   Gastrointestinal: Positive for nausea.  Genitourinary:       Pelvic pain Bloating  Musculoskeletal: Negative.   Skin: Negative.   Neurological: Positive for headaches.  Endo/Heme/Allergies: Negative.   Psychiatric/Behavioral: Negative.     PHYSICAL EXAMINATION:    BP 110/82 (BP Location: Right Arm, Patient Position: Sitting, Cuff Size: Normal)   Pulse 80   Temp 98.7 F (37.1 C) (Skin)   Wt 129 lb (58.5 kg)   LMP 11/01/2018   BMI 26.96 kg/m     General appearance: alert, cooperative and appears stated age Abdomen: soft, non-tender; non distended, no masses,  no organomegaly  Ultrasound images from the ER reviewed with the patient  ASSESSMENT Ruptured ovarian cyst, pain improving today, she doesn't have an  acute abdomen Prior complex cyst has resolved Oligomenorrhea, normal TSH, prolactin, ultrasound c/w PCOS. Her lining is 1.2 cm, just had her cycle a  few weeks ago Overweight PCOS    PLAN Discussed PCOS, information given Recommended she work on weight loss F/U in one month, will check HgbA1C and lipids Call with worsening pain Use cyclic provera again this next month given her thickened lining on ultrasound Can't take OCP's, discussed depo-provera to try and suppress cycles, but concerns about weight gain.     An After Visit Summary was printed and given to the patient.  ~15 minutes face to face time of which over 50% was spent in counseling.

## 2018-11-11 NOTE — Telephone Encounter (Signed)
Patient was seen in the ER Monday and has a ruptured ovarian cyst. Calling to schedule follow up appointment.

## 2018-11-11 NOTE — Telephone Encounter (Signed)
Patient needs appointment in 4 weeks for aex. Can triage please assist in scheduling? Thank you

## 2018-11-12 ENCOUNTER — Ambulatory Visit: Payer: Self-pay

## 2018-11-12 ENCOUNTER — Encounter: Payer: Self-pay | Admitting: Family Medicine

## 2018-11-12 ENCOUNTER — Ambulatory Visit (INDEPENDENT_AMBULATORY_CARE_PROVIDER_SITE_OTHER): Payer: BLUE CROSS/BLUE SHIELD | Admitting: Family Medicine

## 2018-11-12 DIAGNOSIS — J4521 Mild intermittent asthma with (acute) exacerbation: Secondary | ICD-10-CM | POA: Diagnosis not present

## 2018-11-12 DIAGNOSIS — E663 Overweight: Secondary | ICD-10-CM | POA: Insufficient documentation

## 2018-11-12 MED ORDER — MONTELUKAST SODIUM 10 MG PO TABS: 10.0000 mg | ORAL_TABLET | Freq: Every day | ORAL | 3 refills | Status: DC

## 2018-11-12 NOTE — Progress Notes (Signed)
No chief complaint on file.   Subjective: Patient is a 23 y.o. female here for asthma f/u. Due to COVID-19 pandemic, we are interacting via web portal for an electronic face-to-face visit. I verified patient's ID using 2 identifiers. Patient agreed to proceed with visit via this method. Patient is at home, I am at office. Patient and I are present for visit.   She is having freq flares with pollen as her known trigger. With prior use of ICS, she developed palpitations which was also true with PO steroids. She was tx'd for sinusitis via E visit with an urgent care service and has been taking Augmentin. Feels a little better. Chest is tight, coughing up green mucus. No fevers. Using breathing tx's to manage tightness. Requesting to see specialist. Has never been on Singulair.   Felt she may have PCOS and is asking for advice for weight loss. Not very active now, diet is fair. Eats 2 meals daily, does indulge in sweets.   ROS: Heart: Denies chest pain  Lungs: + SOB   Past Medical History:  Diagnosis Date  . Allergic rhinitis   . Asthma   . Eczema   . Environmental allergies    Age 43 years  . Generalized headaches    Began at age 42 years  . Influenza    Age 86 years  . 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 Streptococcal infection(041.00)    Age 74 and 23 years old    Objective: No conversational dyspnea Age appropriate judgment and insight Nml affect and mood  Assessment and Plan: Mild intermittent asthma with (acute) exacerbation - Plan: montelukast (SINGULAIR) 10 MG tablet, Ambulatory referral to Allergy  Overweight (BMI 25.0-29.9)  Orders as above. Cont SABA prn. Add Singulair, may only need to use during pollen season. Refer to allergy/asthma specialty team at her request. Hold off on steroids for now.  Healthy diet handout given. Counseled on diet and exercise.  F/u prn.  The patient voiced understanding and agreement to the plan.  Maria Smith  Wolsey, DO 11/12/18  11:12 AM

## 2018-11-12 NOTE — Telephone Encounter (Signed)
Patient calls and says she feels worse than she did when she had her visit this morning. She says her chest feels heavy, irritated burning sensation, she coughed up pink-tinged sputum. She says it was green earlier. She asks if she should go to an UC. I advised if she's feeling worse than she did this morning, to go to UC. Advised to follow the recommendations of Dr. Carmelia Roller on this morning. She says she will just wait and see how things go, take her breathing treatment, because she really doesn't want to go to the ED or UC.

## 2018-11-13 ENCOUNTER — Other Ambulatory Visit: Payer: Self-pay

## 2018-11-13 ENCOUNTER — Encounter (HOSPITAL_COMMUNITY): Payer: Self-pay

## 2018-11-13 ENCOUNTER — Ambulatory Visit (HOSPITAL_COMMUNITY)
Admission: EM | Admit: 2018-11-13 | Discharge: 2018-11-13 | Disposition: A | Discharge status: Home / Self Care | Payer: BLUE CROSS/BLUE SHIELD | Attending: Internal Medicine | Admitting: Internal Medicine

## 2018-11-13 ENCOUNTER — Ambulatory Visit (INDEPENDENT_AMBULATORY_CARE_PROVIDER_SITE_OTHER): Payer: BLUE CROSS/BLUE SHIELD

## 2018-11-13 DIAGNOSIS — R059 Cough, unspecified: Secondary | ICD-10-CM

## 2018-11-13 DIAGNOSIS — R079 Chest pain, unspecified: Secondary | ICD-10-CM | POA: Diagnosis not present

## 2018-11-13 DIAGNOSIS — R05 Cough: Secondary | ICD-10-CM

## 2018-11-13 DIAGNOSIS — J4520 Mild intermittent asthma, uncomplicated: Secondary | ICD-10-CM | POA: Diagnosis not present

## 2018-11-13 NOTE — Telephone Encounter (Signed)
If she can't breathe, consider UC. Could see me tomorrow for her asthma exacerbation alternatively and we could do breathing tx's. Ty.

## 2018-11-13 NOTE — ED Provider Notes (Signed)
[facility]    CSN: [id number] Arrival date & time: 11/13/18  1345     History   Chief Complaint Chief Complaint  Patient presents with   chest congestion    HPI Maria Smith is a 23 y.o. female with a history of asthma comes to urgent care with complaints of increasing cough with some blood-tinged sputum.  Symptoms have been ongoing for the past few days.  She denies any fever or chills.  No shortness of breath.  Patient tested negative for COVID-19 on Oct 23, 2018.  She denies any chest pressure, wheezing or chest tightness.  She has some flickering sensation in the right upper chest.  She has associated sore throat.  No dizziness, near syncope or syncopal episodes.  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   Migraine with aura and without status migrainosus, not intractable 09/11/2012   Variants of migraine, not elsewhere classified, without mention of intractable migraine without mention of status migrainosus 09/11/2012   Unspecified constipation 09/11/2012   Syncope 08/31/2010   Neck Pain 08/31/2010   Insomnia 08/31/2010    Past Surgical History:  Procedure Laterality Date   TONSILLECTOMY  2012   WISDOM TOOTH EXTRACTION      OB History    Gravida  0   Para  0   Term  0   Preterm  0   AB  0   Living  0     SAB  0   TAB  0   Ectopic  0   Multiple  0   Live  Births  0            Home Medications    Prior to Admission medications   Medication Sig Start Date End Date Taking? Authorizing Provider  albuterol (PROVENTIL HFA;VENTOLIN HFA) 108 (90 Base) MCG/ACT inhaler Inhale 2 puffs into the lungs every 6 (six) hours as needed for wheezing or shortness of breath. 09/01/18   Sharlene DoryWendling, Nicholas Paul, DO  amoxicillin-clavulanate (AUGMENTIN) 875-125 MG tablet Take 1 tablet by mouth 2 (two) times daily. 11/09/18   Worthy RancherWebb, Padonda B, FNP  HYDROcodone-acetaminophen (NORCO/VICODIN) 5-325 MG tablet Take 1 tablet by mouth every 8 (eight) hours as needed. 11/10/18   Couture, Cortni S, PA-C  ibuprofen (ADVIL) 600 MG tablet Take 1 tablet (600 mg total) by mouth every 6 (six) hours as needed. 11/10/18   Couture, Cortni S, PA-C  montelukast (SINGULAIR) 10 MG tablet Take 1 tablet (10 mg total) by mouth at bedtime. 11/12/18   Sharlene DoryWendling, Nicholas Paul, DO    Family History Family History  Problem Relation Age of Onset   Migraines Paternal Grandmother    Migraines Paternal Grandfather    HIV Father    Diabetes Mother    Hypertension Mother    Heart disease Mother  No details   Anemia Mother    Anemia Sister    Migraines Paternal Aunt    Migraines Maternal Uncle        Maternal Great Uncle   Diabetes Other        Family History   Hypertension Other        Family History   Depression Other        Family History   Asthma Other        Family History   Allergic rhinitis Other        Family History    Social History Social History   Tobacco Use   Smoking status: Never Smoker   Smokeless tobacco: Never Used  Substance Use Topics   Alcohol use: Not Currently   Drug use: No     Allergies   Patient has no known allergies.   Review of Systems Review of Systems  Constitutional: Negative for activity change and appetite change.  HENT: Positive for congestion. Negative for ear discharge, hearing loss, mouth sores, postnasal  drip, rhinorrhea, sinus pressure and sinus pain.   Respiratory: Positive for cough. Negative for chest tightness, shortness of breath and wheezing.   Cardiovascular: Negative for chest pain and palpitations.  Gastrointestinal: Negative for abdominal distention, abdominal pain, diarrhea and nausea.  Genitourinary: Negative for dysuria, frequency and urgency.  Musculoskeletal: Negative for arthralgias, back pain, myalgias and neck pain.  Skin: Negative.   Neurological: Negative for dizziness, seizures, weakness and numbness.     Physical Exam Triage Vital Signs ED Triage Vitals  Enc Vitals Group     BP 11/13/18 1405 97/62     Pulse Rate 11/13/18 1405 98     Resp 11/13/18 1405 16     Temp 11/13/18 1405 98.8 F (37.1 C)     Temp Source 11/13/18 1405 Oral     SpO2 11/13/18 1405 99 %     Weight 11/13/18 1403 129 lb (58.5 kg)     Height --      Head Circumference --      Peak Flow --      Pain Score 11/13/18 1403 6     Pain Loc --      Pain Edu? --      Excl. in GC? --    No data found.  Updated Vital Signs BP 97/62 (BP Location: Right Arm)    Pulse 98    Temp 98.8 F (37.1 C) (Oral)    Resp 16    Wt 58.5 kg    LMP 11/01/2018 (Exact Date)    SpO2 99%    BMI 26.96 kg/m   Visual Acuity Right Eye Distance:   Left Eye Distance:   Bilateral Distance:    Right Eye Near:   Left Eye Near:    Bilateral Near:     Physical Exam Constitutional:      General: She is not in acute distress.    Appearance: Normal appearance. She is not ill-appearing.  HENT:     Right Ear: Tympanic membrane normal.     Left Ear: Tympanic membrane normal.     Mouth/Throat:     Pharynx: Oropharynx is clear. No oropharyngeal exudate or posterior oropharyngeal erythema.  Cardiovascular:     Rate and Rhythm: Normal rate and regular rhythm.     Pulses: Normal pulses.     Heart sounds: Normal heart sounds.  Pulmonary:     Effort: Pulmonary effort is normal. No respiratory distress.  Breath sounds:  Normal breath sounds. No wheezing, rhonchi or rales.  Abdominal:     General: Bowel sounds are normal.     Palpations: Abdomen is soft.  Musculoskeletal: Normal range of motion.  Skin:    General: Skin is warm.     Capillary Refill: Capillary refill takes less than 2 seconds.  Neurological:     Mental Status: She is alert.      UC Treatments / Results  Labs (all labs ordered are listed, but only abnormal results are displayed) Labs Reviewed - No data to display  EKG None  Radiology Dg Chest 2 View  Result Date: 11/13/2018 CLINICAL DATA:  Chest pain and cough, history of asthma EXAM: CHEST - 2 VIEW COMPARISON:  10/05/2018 FINDINGS: The heart size and mediastinal contours are within normal limits. Both lungs are clear. The visualized skeletal structures are unremarkable. IMPRESSION: No active cardiopulmonary disease. Electronically Signed   By: Judie Petit.  Shick M.D.   On: 11/13/2018 14:54    Procedures Procedures (including critical care time)  Medications Ordered in UC Medications - No data to display  Initial Impression / Assessment and Plan / UC Course  I have reviewed the triage vital signs and the nursing notes.  Pertinent labs & imaging results that were available during my care of the patient were reviewed by me and considered in my medical decision making (see chart for details).     1.  Cough with blood-tinged sputum: Chest x-ray Patient is currently on antibiotics If chest x-ray is unremarkable patient will continue bronchodilator treatment.  2.  Mild intermittent asthma: Continue bronchodilator treatment as needed for shortness of breath. Final Clinical Impressions(s) / UC Diagnoses   Final diagnoses:  None   Discharge Instructions   None    ED Prescriptions    None     Controlled Substance Prescriptions [place] Controlled Substance Registry consulted? No   Merrilee Jansky, MD 11/13/18 [phone]

## 2018-11-13 NOTE — Telephone Encounter (Signed)
Called the patient and no answer/ Patient is currently at the [facility]

## 2018-11-13 NOTE — ED Triage Notes (Signed)
Pt states she has chest congestion x days. Pt was just seen at the ER for a ovarian cyst. 11/10/18 pt has been taking antibiotics. She says she needs a work note.

## 2018-11-14 ENCOUNTER — Other Ambulatory Visit: Payer: Self-pay | Admitting: Family Medicine

## 2018-11-14 ENCOUNTER — Encounter: Payer: Self-pay | Admitting: Family Medicine

## 2018-11-14 ENCOUNTER — Telehealth: Payer: Self-pay | Admitting: Family Medicine

## 2018-11-14 ENCOUNTER — Telehealth: Payer: Self-pay | Admitting: *Deleted

## 2018-11-14 MED ORDER — CYCLOBENZAPRINE HCL 10 MG PO TABS: 5.0000 mg | ORAL_TABLET | Freq: Three times a day (TID) | ORAL | 0 refills | Status: DC | PRN

## 2018-11-14 NOTE — Telephone Encounter (Signed)
OK w me.  

## 2018-11-14 NOTE — Telephone Encounter (Signed)
Copied from CRM [id number]. Topic: Appointment Scheduling - Transfer of Care >> Nov 14, 2018 12:29 PM Leafy Ro wrote: Pt is requesting to transfer FROM: DR  wendling Pt is requesting to transfer TO: Macario Carls Reason for requested transfer:pt wife Riley Churches sees Macario Carls  Send CRM to patient's current PCP (transferring FROM).

## 2018-11-14 NOTE — Telephone Encounter (Signed)
OK with me.  [name], can you please schedule pt to see me for an establish care visit. We can do that virtually

## 2018-11-14 NOTE — Telephone Encounter (Signed)
She was referred to an asthma specialist 2 days ago if that is what she is referring. Ty.

## 2018-11-14 NOTE — Telephone Encounter (Signed)
Copied from CRM [id number]. Topic: General - Other >> Nov 14, 2018  8:27 AM Elliot Gault wrote: Relation to pt: self  Call back number: [phone] (Preferred)  Reason for call:  Patient states she feels nausea and experiencing muscle spasm patient states PCP is aware and would like to be referred to a specialist due to symptoms not improving, patient didn't specify what specialist would be appropriate, please advise  She was referring to allergy referral.. will contact referral coordinator to check where in the process this referral is.

## 2018-11-16 DIAGNOSIS — Z20828 Contact with and (suspected) exposure to other viral communicable diseases: Secondary | ICD-10-CM | POA: Diagnosis not present

## 2018-11-17 NOTE — Telephone Encounter (Signed)
Patient requested to be transferred from Dr. Carmelia Roller to Sandford Craze. See other telephone message dated 11/14/2018.

## 2018-11-21 ENCOUNTER — Encounter: Payer: Self-pay | Admitting: Family

## 2018-11-21 ENCOUNTER — Other Ambulatory Visit: Payer: Self-pay

## 2018-11-21 ENCOUNTER — Ambulatory Visit (INDEPENDENT_AMBULATORY_CARE_PROVIDER_SITE_OTHER): Payer: BC Managed Care – PPO | Admitting: Family

## 2018-11-21 DIAGNOSIS — J45909 Unspecified asthma, uncomplicated: Secondary | ICD-10-CM | POA: Diagnosis not present

## 2018-11-21 DIAGNOSIS — G43909 Migraine, unspecified, not intractable, without status migrainosus: Secondary | ICD-10-CM | POA: Diagnosis not present

## 2018-11-21 DIAGNOSIS — M5412 Radiculopathy, cervical region: Secondary | ICD-10-CM | POA: Diagnosis not present

## 2018-11-21 DIAGNOSIS — J329 Chronic sinusitis, unspecified: Secondary | ICD-10-CM

## 2018-11-21 NOTE — Progress Notes (Signed)
Virtual Visit via Video Note  I connected with Maria Smith on 11/21/18 at  9:00 AM EDT by a video enabled telemedicine application and verified that I am speaking with the correct person using two identifiers.  Location: Patient: home Provider: home   I discussed the limitations of evaluation and management by telemedicine and the availability of in person appointments. The patient expressed understanding and agreed to proceed.  History of Present Illness:  Patient is a 23 yr old female who presents today in transfer of care from another provider at our clinic.  Pmhx is significant for the following:  Migraine headaches- reports that she previously had migraines 9-11'th grade.  Saw neurology at that time.  Reports that she had aged out and did not return. Then Ha's were controlled until recently when she began having daily headaches.   Tingling in the left thumb and index finger, up medial forearm. Reports that she does have some neck pain.   Asthma- notes some occasional chest pain. Sometimes this is on the right side of her chest, sometimes left sometimes middle.  Pain is brief and described as stabbing in nature.   Notes + intermittent palpitaitons. Denies heavy periods.  Reports recent ruptured ovarian cyst.    Lab Results  Component Value Date   WBC 6.1 11/10/2018   HGB 13.2 11/10/2018   HCT 40.5 11/10/2018   MCV 87.5 11/10/2018   PLT 335 11/10/2018   She also reports c/o asthma symptoms. These symptoms began in March. Reports that she moved in with her mother in law who has 5 dogs in mid February.  Yesterday she moved into her new house. Notes phlegm is clear one day and dark the next. Denies recent fever.  99.1 or 99.2 rarely. Reports that she is hot natured.    Observations/Objective:  Gen: Awake, alert, no acute distress Resp: Breathing is even and non-labored Psych: calm/pleasant demeanor Neuro: Alert and Oriented x 3, + facial symmetry, speech is  clear.   Assessment and Plan:  Cervical radiculopathy- advised pt that I think that the numbness in her left thumb and index finger is related to cervical radiculopathy. Recommended trial of meloxicam.  Sinusitus- she only was treated with augmentin x 7 days. Will plan to retreat x 10 days in all.   Asthma- I suspect that her symptoms were worsened by exposure to 5 dogs recently in the home. Thankfully she is now in a pet free home. Plan to continue singulair prn. Albuterol prn.    Migraine- trial of prn imitrex.   Follow Up Instructions:    I discussed the assessment and treatment plan with the patient. The patient was provided an opportunity to ask questions and all were answered. The patient agreed with the plan and demonstrated an understanding of the instructions.   The patient was advised to call back or seek an in-person evaluation if the symptoms worsen or if the condition fails to improve as anticipated.  Lemont Fillers, NP

## 2018-11-22 MED ORDER — MELOXICAM 7.5 MG PO TABS: 7.5000 mg | ORAL_TABLET | Freq: Every day | ORAL | 0 refills | Status: DC

## 2018-11-22 MED ORDER — AMOXICILLIN-POT CLAVULANATE 875-125 MG PO TABS: 1.0000 | ORAL_TABLET | Freq: Two times a day (BID) | ORAL | 0 refills | Status: DC

## 2018-11-22 MED ORDER — SUMATRIPTAN SUCCINATE 50 MG PO TABS: ORAL_TABLET | ORAL | 2 refills | Status: DC

## 2018-11-25 ENCOUNTER — Other Ambulatory Visit: Payer: Self-pay

## 2018-11-25 ENCOUNTER — Ambulatory Visit (INDEPENDENT_AMBULATORY_CARE_PROVIDER_SITE_OTHER): Payer: BLUE CROSS/BLUE SHIELD | Admitting: Allergy and Immunology

## 2018-11-25 ENCOUNTER — Encounter: Payer: Self-pay | Admitting: Allergy and Immunology

## 2018-11-25 DIAGNOSIS — H1010 Acute atopic conjunctivitis, unspecified eye: Secondary | ICD-10-CM | POA: Diagnosis not present

## 2018-11-25 DIAGNOSIS — J4530 Mild persistent asthma, uncomplicated: Secondary | ICD-10-CM

## 2018-11-25 DIAGNOSIS — J3089 Other allergic rhinitis: Secondary | ICD-10-CM | POA: Diagnosis not present

## 2018-11-25 DIAGNOSIS — K219 Gastro-esophageal reflux disease without esophagitis: Secondary | ICD-10-CM

## 2018-11-25 DIAGNOSIS — J301 Allergic rhinitis due to pollen: Secondary | ICD-10-CM | POA: Diagnosis not present

## 2018-11-25 MED ORDER — FLUTICASONE FUROATE 100 MCG/ACT IN AEPB: 1.0000 | INHALATION_SPRAY | Freq: Every day | RESPIRATORY_TRACT | 5 refills | Status: DC

## 2018-11-25 MED ORDER — ALBUTEROL SULFATE HFA 108 (90 BASE) MCG/ACT IN AERS: 2.0000 | INHALATION_SPRAY | Freq: Four times a day (QID) | RESPIRATORY_TRACT | 3 refills | Status: DC | PRN

## 2018-11-25 MED ORDER — OLOPATADINE HCL 0.2 % OP SOLN: 1.0000 [drp] | OPHTHALMIC | 5 refills | Status: DC

## 2018-11-25 MED ORDER — FAMOTIDINE 40 MG PO TABS: 40.0000 mg | ORAL_TABLET | Freq: Every day | ORAL | 5 refills | Status: DC

## 2018-11-25 MED ORDER — OMEPRAZOLE 40 MG PO CPDR: 40.0000 mg | DELAYED_RELEASE_CAPSULE | Freq: Every day | ORAL | 5 refills | Status: DC

## 2018-11-25 NOTE — Progress Notes (Signed)
[place]   Dear Dr. Peggyann Juba'Sullivan,  Thank you for referring Maria MawInfinity C Hayden RasmussenMcQueen Smith [facility] [place] on 11/25/2018.   Below is a summation of this patient's evaluation and recommendations.  Thank you for your referral. I will keep you informed about this patient's response to treatment.   If you have any questions please do not hesitate to contact me.   Sincerely,  Jessica PriestEric J. , MD Allergy / Immunology [facility]   ______________________________________________________________________    NEW PATIENT NOTE  Referring Provider: Sandford Craze'Sullivan, [name], NP Primary Provider: Sandford Craze'Sullivan, [name], NP Date of office visit: 11/25/2018    Subjective:   Chief Complaint:  Maria Smith Camuso (DOB: Jun 29, 1995) is a 23 y.o. female who presents to the clinic on 11/25/2018 with a chief complaint of Asthma (SOB) and Sore Throat (possible heartburn ) .     HPI: Domenic Politenfinity presents to this clinic in evaluation of allergies and breathing problems.  She has a long history of childhood asthma that resolved for many years only to present itself once again over the course of the past 3 months.  She has developed intermittent episodes of chest tightness and wheezing and coughing for which she has been to the urgent care center on several occasions and has received systemic steroid on one occasion and has been using her bronchodilator about 3 times per week.  Her bronchodilator use may or may not help this issue.  Interestingly, she also has lots of burning in her throat over the course of the past several months.  She has mucus stuck in her throat and occasionally she feels as though she is "choking in her throat" and she has lots of throat clearing.  She does have reflux up into her mid chest region and it really depends on her eating behavior whether or not this  is a common issue.  She does not drink any caffeine or eat any chocolate.  She has also been having lots of sneezing and she has been having itchy irritated eyes for the past 3 months as well.  Sometimes she gets a sneezing episode and she will actually develop chest tightness with these episodes.  She has been seen by a ophthalmologist for itchy scratchy eyes and apparently was given eyedrops and had tear duct implants placed but these were removed as they do really did not help her very much at all.  Finally, she states that sometimes when she eats a tomato sauce she will develop these non-itchy red spots on her arms that last about 30 minutes.  She can eat tomatoes without any problem.  It should be noted that she had a very significant change in her environment in February 2020.  She moved out of her apartment and was living with another family who had 5 dogs in an old house.  Fortunately, she moved out of that house and is now and a entire different residence over the course of the past 5 days.  Past Medical History:  Diagnosis Date  . Allergic rhinitis   . Asthma   . Eczema   . Environmental allergies    Age 25 years  . Generalized headaches    Began at age 23 years  . Influenza    Age 36 years  . 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 Streptococcal infection(041.00)    Age  23 and 23 years old    Past Surgical History:  Procedure Laterality Date  . TONSILLECTOMY  2012  . WISDOM TOOTH EXTRACTION      Allergies as of 11/25/2018   No Known Allergies     Medication List    albuterol 108 (90 Base) MCG/ACT inhaler Commonly known as:  VENTOLIN HFA Inhale 2 puffs into the lungs every 6 (six) hours as needed for wheezing or shortness of breath.   fluticasone 110 MCG/ACT inhaler Commonly known as:  FLOVENT HFA Inhale 1 puff into the lungs 2 (two) times daily.   ibuprofen 600 MG tablet Commonly known as:  ADVIL Take 1 tablet (600 mg total) by mouth every 6 (six)  hours as needed.   meloxicam 7.5 MG tablet Commonly known as:  MOBIC Take 1 tablet (7.5 mg total) by mouth daily. Do not take with ibuprofen   montelukast 10 MG tablet Commonly known as:  SINGULAIR Take 1 tablet (10 mg total) by mouth at bedtime.   omeprazole 20 MG capsule Commonly known as:  PRILOSEC Take 20 mg by mouth daily.   SUMAtriptan 50 MG tablet Commonly known as:  Imitrex May repeat in 2 hours if headache persists or recurs. (max 2 tabs/24 hrs)       Review of systems negative except as noted in HPI / PMHx or noted below:  Review of Systems  Constitutional: Negative.   HENT: Negative.   Eyes: Negative.   Respiratory: Negative.   Cardiovascular: Negative.   Gastrointestinal: Negative.   Genitourinary: Negative.   Musculoskeletal: Negative.   Skin: Negative.   Neurological: Negative.   Endo/Heme/Allergies: Negative.   Psychiatric/Behavioral: Negative.     Family History  Problem Relation Age of Onset  . Migraines Paternal Grandmother   . Migraines Paternal Grandfather   . HIV Father   . Diabetes Mother   . Hypertension Mother   . Anemia Mother   . COPD Mother   . CVA Mother   . Congestive Heart Failure Mother   . Cirrhosis Mother   . Obesity Mother   . Ulcers Brother   . Anemia Sister   . Cholecystitis Brother   . Migraines Paternal Aunt   . Migraines Maternal Uncle        Maternal Great Uncle  . Diabetes Other        Family History  . Hypertension Other        Family History  . Depression Other        Family History  . Asthma Other        Family History  . Allergic rhinitis Other        Family History  . Asthma Brother     Social History   Socioeconomic History  . Marital status: Married    Spouse name: Not on file  . Number of children: Not on file  . Years of education: Not on file  . Highest education level: Not on file  Occupational History  . Not on file  Social Needs  . Financial resource strain: Not on file  . Food  insecurity    Worry: Not on file    Inability: Not on file  . Transportation needs    Medical: Not on file    Non-medical: Not on file  Tobacco Use  . Smoking status: Never Smoker  . Smokeless tobacco: Never Used  Substance and Sexual Activity  . Alcohol use: Not Currently  . Drug use: No  . Sexual activity:  Yes    Partners: Female    Comment: female partner  Lifestyle  . Physical activity    Days per week: Not on file    Minutes per session: Not on file  . Stress: Not on file  Relationships  . Social Herbalist on phone: Not on file    Gets together: Not on file    Attends religious service: Not on file    Active member of club or organization: Not on file    Attends meetings of clubs or organizations: Not on file    Relationship status: Not on file  . Intimate partner violence    Fear of current or ex partner: Not on file    Emotionally abused: Not on file    Physically abused: Not on file    Forced sexual activity: Not on file  Other Topics Concern  . 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     Environmental and Social history  Lives in a house with a dry environment, no animals located inside the household, carpet in the bedroom, no plastic on the bed, no plastic on the pillow, no smoking ongoing with inside the household.  Objective:     Physical Exam Constitutional:      Appearance: She is not diaphoretic.  HENT:     Head: Normocephalic. No right periorbital erythema or left periorbital erythema.     Right Ear: Tympanic membrane, ear canal and external ear normal.     Left Ear: Tympanic membrane, ear canal and external ear normal.     Nose: Nose normal. No mucosal edema or rhinorrhea.     Mouth/Throat:     Pharynx: No oropharyngeal exudate.   Eyes:     General: Lids are normal.     Conjunctiva/sclera: Conjunctivae normal.     Pupils: Pupils are equal, round, and reactive to light.  Neck:     Thyroid: No thyromegaly.     Trachea: Trachea normal. No tracheal deviation.  Cardiovascular:     Rate and Rhythm: Normal rate and regular rhythm.     Heart sounds: Normal heart sounds, S1 normal and S2 normal. No murmur.  Pulmonary:     Effort: Pulmonary effort is normal. No respiratory distress.     Breath sounds: No stridor. No wheezing or rales.  Chest:     Chest wall: No tenderness.  Abdominal:     General: There is no distension.     Palpations: Abdomen is soft. There is no mass.     Tenderness: There is no abdominal tenderness. There is no guarding or rebound.  Musculoskeletal:        General: No tenderness.  Lymphadenopathy:     Head:     Right side of head: No tonsillar adenopathy.     Left side of head: No tonsillar adenopathy.     Cervical: No cervical adenopathy.  Skin:    Coloration: Skin is not pale.     Findings: No erythema or rash.     Nails: There is no clubbing.   Neurological:     Mental Status: She is alert.     Diagnostics: Allergy skin tests were performed.  She demonstrated hypersensitivity to trees,  grasses, weeds, house dust mite, and cockroach.  Spirometry was performed and demonstrated an FEV1 of 1.99 @ 85 % of predicted. FEV1/FVC = 0.71  Results of a chest x-ray obtained 13 Nov 2018 identified the following:  The heart size and mediastinal contours are within normal limits. Both lungs are clear. The visualized skeletal structures are unremarkable.  Results of blood tests obtained 10 Nov 2018 identified creatinine 0.96 mg/DL, AST 20 6U/L, ALT 20 2U/L, WBC 6.1, hemoglobin 13.2, platelet 335.  Assessment and Plan:    1. Not well controlled mild persistent asthma   2. Perennial allergic rhinitis   3. Seasonal allergic rhinitis due to pollen   4. Seasonal allergic conjunctivitis   5. LPRD  (laryngopharyngeal reflux disease)     1.  Allergen avoidance measures  2.  Treat and prevent inflammation:   A.  OTC Nasacort/Rhinocort-1 spray each nostril daily  B.  Arnuity 100-1 inhalation daily  3.  Treat and prevent reflux:   A.  Omeprazole 40 mg - 1 tablet in a.m.  B.  Famotidine 40 mg - 1 tablet in p.m.  4.  If needed:   A.  OTC antihistamine-cetirizine/loratadine 10 mg - 1 tablet daily  B.  OTC Pataday-1 drop each eye daily  C.  Albuterol HFA 2 inhalations every 4-6 hours  5.  Return to clinic in 4 weeks or earlier if problem  Trinaty has a history and diagnostic tests consistent with a combination of atopic inflammation of her airway and conjunctiva and reflux induced respiratory disease and we will address both these issues with the therapy noted above.  I will regroup with her in 4 weeks to assess her response to this plan.  Further evaluation and treatment will be based upon her response.  Jessica PriestEric J. , MD Allergy / Immunology [facility] [place] [place]

## 2018-11-25 NOTE — Patient Instructions (Addendum)
  1.  Allergen avoidance measures  2.  Treat and prevent inflammation:   A.  OTC Nasacort/Rhinocort-1 spray each nostril daily  B.  Arnuity 100-1 inhalation daily  3.  Treat and prevent reflux:   A.  Omeprazole 40 mg - 1 tablet in a.m.  B.  Famotidine 40 mg - 1 tablet in p.m.  4.  If needed:   A.  OTC antihistamine-cetirizine/loratadine 10 mg - 1 tablet daily  B.  OTC Pataday-1 drop each eye daily  C.  Albuterol HFA 2 inhalations every 4-6 hours  5.  Return to clinic in 4 weeks or earlier if problem

## 2018-11-26 ENCOUNTER — Encounter: Payer: Self-pay | Admitting: Allergy and Immunology

## 2018-11-26 ENCOUNTER — Encounter: Payer: Self-pay | Admitting: Family

## 2018-12-02 ENCOUNTER — Other Ambulatory Visit: Payer: Self-pay

## 2018-12-02 ENCOUNTER — Ambulatory Visit (INDEPENDENT_AMBULATORY_CARE_PROVIDER_SITE_OTHER): Payer: BC Managed Care – PPO | Admitting: Family

## 2018-12-02 VITALS — BP 106/80 | HR 73 | Temp 99.3°F | Resp 16 | Wt 124.8 lb

## 2018-12-02 DIAGNOSIS — J4521 Mild intermittent asthma with (acute) exacerbation: Secondary | ICD-10-CM | POA: Diagnosis not present

## 2018-12-02 DIAGNOSIS — E282 Polycystic ovarian syndrome: Secondary | ICD-10-CM | POA: Diagnosis not present

## 2018-12-02 DIAGNOSIS — E049 Nontoxic goiter, unspecified: Secondary | ICD-10-CM | POA: Diagnosis not present

## 2018-12-02 DIAGNOSIS — R002 Palpitations: Secondary | ICD-10-CM | POA: Diagnosis not present

## 2018-12-02 NOTE — Progress Notes (Signed)
Subjective:    Patient ID: Maria Smith, female    DOB: [birth year], 23 y.o.   MRN: [id number]  HPI  Patient is a 23 yr old female who presents today for follow up of her virtual visit on 11/21/18. At that time she complained of intermittent palpitations. Patient reports that it happens throughout the day. Happens more when she is active. Reports that she has had HA's, chest wall tenderness. 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 Reports "so much mucous" comes up her throat- reports that the mucous is really thick.  She did recently see an allergist for testing who placed her on albuterol/pepcid, Arnuity,   PCOS- she is followed by [facility]. She is not a candidate for ocp due to hx of migraine with aura.  Denies hirsuitism but does endorse + hx of acne.   Wt Readings from Last 3 Encounters:  12/02/18 124 lb 12.8 oz (56.6 kg)  11/13/18 129 lb (58.5 kg)  11/11/18 129 lb (58.5 kg)   Reports bp 99/58 the other day. 98/60 the day before. BP Readings from Last 3 Encounters:  12/02/18 106/80  11/13/18 97/62  11/11/18 110/82    She reports that she did a holter monitor through [facility] few years back. Denies any caffeine intake. Rare decaf tea.  Notes some anxiety but wants to try to handle it naturally and avoid medications.    Review of Systems    see HPI  Past Medical History:  Diagnosis Date  . Allergic rhinitis   . Asthma   . Eczema   . Environmental allergies    Age 54 years  . Generalized headaches    Began at age 66 years  . Influenza    Age 16 years  . 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 Streptococcal infection(041.00)    Age 23 and 23 years old     Social History   Socioeconomic History  . Marital status: Married    Spouse name: Not on file  . Number of children: Not on file  . Years of education: Not on file  . Highest education level: Not on file  Occupational History  . Not on file  Social Needs  . Financial resource strain: Not  on file  . Food insecurity    Worry: Not on file    Inability: Not on file  . Transportation needs    Medical: Not on file    Non-medical: Not on file  Tobacco Use  . Smoking status: Never Smoker  . Smokeless tobacco: Never Used  Substance and Sexual Activity  . Alcohol use: Not Currently  . Drug use: No  . Sexual activity: Yes    Partners: Female    Comment: female partner  Lifestyle  . Physical activity    Days per week: Not on file    Minutes per session: Not on file  . Stress: Not on file  Relationships  . Social Herbalist on phone: Not on file    Gets together: Not on file    Attends religious service: Not on file    Active member of club or organization: Not on file    Attends meetings of clubs or organizations: Not on file    Relationship status: Not on file  . Intimate partner violence    Fear of current or ex partner: Not on file    Emotionally abused: Not on file    Physically abused: Not on  file    Forced sexual activity: Not on file  Other Topics Concern  . 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     Past Surgical History:  Procedure Laterality Date  . TONSILLECTOMY  2012  . WISDOM TOOTH EXTRACTION      Family History  Problem Relation Age of Onset  . Migraines Paternal Grandmother   . Migraines Paternal Grandfather   . HIV Father   . Diabetes Mother   . Hypertension Mother   . Anemia Mother   . COPD Mother   . CVA Mother   . Congestive Heart Failure Mother   . Cirrhosis Mother   . Obesity Mother   . Ulcers Brother   . Anemia Sister   . Cholecystitis Brother   . Migraines Paternal Aunt   . Migraines Maternal Uncle        Maternal Great Uncle  . Diabetes Other        Family History  . Hypertension Other        Family  History  . Depression Other        Family History  . Asthma Other        Family History  . Allergic rhinitis Other        Family History  . Asthma Brother     No Known Allergies  Current Outpatient Medications on File Prior to Visit  Medication Sig Dispense Refill  . albuterol (VENTOLIN HFA) 108 (90 Base) MCG/ACT inhaler Inhale 2 puffs into the lungs every 6 (six) hours as needed for wheezing or shortness of breath. 2 Inhaler 3  . famotidine (PEPCID) 40 MG tablet Take 1 tablet (40 mg total) by mouth daily. 30 tablet 5  . fluticasone (FLOVENT HFA) 110 MCG/ACT inhaler Inhale 1 puff into the lungs 2 (two) times daily.    . Fluticasone Furoate (ARNUITY ELLIPTA) 100 MCG/ACT AEPB Inhale 1 Dose into the lungs daily. 30 each 5  . ibuprofen (ADVIL) 600 MG tablet Take 1 tablet (600 mg total) by mouth every 6 (six) hours as needed. 30 tablet 0  . meloxicam (MOBIC) 7.5 MG tablet Take 1 tablet (7.5 mg total) by mouth daily. Do not take with ibuprofen 14 tablet 0  . montelukast (SINGULAIR) 10 MG tablet Take 1 tablet (10 mg total) by mouth at bedtime. 30 tablet 3  . Olopatadine HCl (PATADAY) 0.2 % SOLN Place 1 drop into both eyes 1 day or 1 dose. 1 Bottle 5  . omeprazole (PRILOSEC) 40 MG capsule Take 1 capsule (40 mg total) by mouth daily. 30 capsule 5  . SUMAtriptan (IMITREX) 50 MG tablet May repeat in 2 hours if headache persists or recurs. (max 2 tabs/24 hrs) 10 tablet 2   No current facility-administered medications on file prior to visit.     BP 106/80 (BP Location: Left Arm, Patient Position: Sitting, Cuff Size: Small)   Pulse 73   Temp 99.3 F (37.4 C) (Oral)   Resp 16   Wt 124 lb 12.8 oz (56.6 kg)   SpO2 100%   BMI 26.08 kg/m    Objective:   Physical Exam Constitutional:      Appearance: She is well-developed.  Neck:     Musculoskeletal: Neck  supple.     Thyroid: Thyromegaly present.  Cardiovascular:     Rate and Rhythm: Normal rate and regular rhythm.     Heart sounds:  Normal heart sounds. No murmur.  Pulmonary:     Effort: Pulmonary effort is normal. No respiratory distress.     Breath sounds: Normal breath sounds. No wheezing.  Musculoskeletal:     Comments: + right anterior chest wall tenderness No left anterior chest wall tenderness  Skin:    General: Skin is warm and dry.  Neurological:     Mental Status: She is alert and oriented to person, place, and time.  Psychiatric:        Behavior: Behavior normal.        Thought Content: Thought content normal.        Judgment: Judgment normal.           Assessment & Plan:  Asthma- encouraged pt to try the antacid meds and inhalers prescribed by her allergist to see if this helps.  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 She wishes to avoid unnecessary medications.  Anxiety- I wonder how much this is contributing to her palpitations.  Consider referral for counseling.   PCOS- sugar is normal. She is working on trying to lose some weight.   Palpitations- EKG tracing is personally reviewed.  EKG notes NSR (sinus brady) and no acute changes. Will have pt return to the lab for TSH, CBC and bmet. She is advised to go to the ER if she develops chest pain or worsening palpitations.  I suspect that her symptoms are related to PVC's and possibly superimposed anxiety.  Previous holter monitor revealed PVC's.   Thyromegaly- check TSH- obtain thyroid US.

## 2018-12-03 ENCOUNTER — Telehealth: Payer: Self-pay | Admitting: Family

## 2018-12-03 ENCOUNTER — Other Ambulatory Visit: Payer: Self-pay

## 2018-12-03 ENCOUNTER — Encounter (HOSPITAL_COMMUNITY): Payer: Self-pay | Admitting: *Deleted

## 2018-12-03 ENCOUNTER — Emergency Department (HOSPITAL_COMMUNITY): Payer: BC Managed Care – PPO

## 2018-12-03 ENCOUNTER — Emergency Department (HOSPITAL_COMMUNITY)
Admission: EM | Admit: 2018-12-03 | Discharge: 2018-12-03 | Disposition: A | Discharge status: Home / Self Care | Payer: BC Managed Care – PPO | Attending: Emergency Medicine | Admitting: Emergency Medicine

## 2018-12-03 DIAGNOSIS — M79642 Pain in left hand: Secondary | ICD-10-CM | POA: Diagnosis not present

## 2018-12-03 DIAGNOSIS — R079 Chest pain, unspecified: Secondary | ICD-10-CM | POA: Diagnosis not present

## 2018-12-03 DIAGNOSIS — Z8709 Personal history of other diseases of the respiratory system: Secondary | ICD-10-CM | POA: Insufficient documentation

## 2018-12-03 DIAGNOSIS — Z79899 Other long term (current) drug therapy: Secondary | ICD-10-CM | POA: Insufficient documentation

## 2018-12-03 DIAGNOSIS — R0789 Other chest pain: Secondary | ICD-10-CM | POA: Diagnosis not present

## 2018-12-03 LAB — CBC WITH DIFFERENTIAL/PLATELET
Abs Immature Granulocytes: 0 10*3/uL (ref 0.00–0.07)
Basophils Absolute: 0 10*3/uL (ref 0.0–0.1)
Basophils Relative: 1 %
Eosinophils Absolute: 0.1 10*3/uL (ref 0.0–0.5)
Eosinophils Relative: 3 %
HCT: 41.2 % (ref 36.0–46.0)
Hemoglobin: 13.3 g/dL (ref 12.0–15.0)
Immature Granulocytes: 0 %
Lymphocytes Relative: 37 %
Lymphs Abs: 1.8 10*3/uL (ref 0.7–4.0)
MCH: 28.3 pg (ref 26.0–34.0)
MCHC: 32.3 g/dL (ref 30.0–36.0)
MCV: 87.7 fL (ref 80.0–100.0)
Monocytes Absolute: 0.3 10*3/uL (ref 0.1–1.0)
Monocytes Relative: 6 %
Neutro Abs: 2.6 10*3/uL (ref 1.7–7.7)
Neutrophils Relative %: 53 %
Platelets: 317 10*3/uL (ref 150–400)
RBC: 4.7 MIL/uL (ref 3.87–5.11)
RDW: 11.8 % (ref 11.5–15.5)
WBC: 4.9 10*3/uL (ref 4.0–10.5)
nRBC: 0 % (ref 0.0–0.2)

## 2018-12-03 LAB — BASIC METABOLIC PANEL
Anion gap: 11 (ref 5–15)
BUN: 7 mg/dL (ref 6–20)
CO2: 24 mmol/L (ref 22–32)
Calcium: 9.2 mg/dL (ref 8.9–10.3)
Chloride: 103 mmol/L (ref 98–111)
Creatinine, Ser: 0.95 mg/dL (ref 0.44–1.00)
GFR calc Af Amer: >60 mL/min (ref >60)
GFR calc non Af Amer: >60 mL/min (ref >60)
Glucose, Bld: 91 mg/dL (ref 70–99)
Potassium: 3.9 mmol/L (ref 3.5–5.1)
Sodium: 138 mmol/L (ref 135–145)

## 2018-12-03 LAB — TROPONIN I: Troponin I: <0.03 ng/mL (ref <0.03)

## 2018-12-03 LAB — I-STAT BETA HCG BLOOD, ED (MC, WL, AP ONLY): I-stat hCG, quantitative: <5 m[IU]/mL (ref <5)

## 2018-12-03 LAB — TSH: TSH: 3.261 u[IU]/mL (ref 0.350–4.500)

## 2018-12-03 NOTE — ED Provider Notes (Signed)
[facility] Provider Note   CSN: [id number] Arrival date & time: 12/03/18  0532    History   Chief Complaint Chief Complaint  Patient presents with  . Chest Pain    HPI Maria Smith is a 23 y.o. female.     23 year old female with history of seasonal allergies and asthma presents to the ED for complaints of chest pain.  She states that pain awoke her from sleep at 0330 today.  Pain has been constant, sharp.  It is nonradiating, but she has started to notice a discomfort in her left hand as well.  She took 81 mg aspirin and applied a topical heating pad for symptoms without relief.  Has been experiencing heart palpitations over the past 2 weeks characterized as her heart "skipping a beat".  Has not noticed significant worsening to these palpitations tonight.  No diaphoresis, shortness of breath, nausea, vomiting, leg swelling, fevers, syncope.  No family history of sudden cardiac death.  Does report family history of CAD in her mother beginning around the age of 23.  The history is provided by the patient. No language interpreter was used.  Chest Pain   Past Medical History:  Diagnosis Date  . Allergic rhinitis   . Asthma   . Eczema   . Environmental allergies    Age 26 years  . Generalized headaches    Began at age 23 years  . Influenza    Age 89 years  . 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 Streptococcal infection(041.00)    Age 23 and 23 years old    Patient Active Problem List   Diagnosis Date Noted  . Overweight (BMI 25.0-29.9) 11/12/2018  . PCOS (polycystic ovarian syndrome) 11/11/2018  . Seasonal allergies 09/22/2018  . Mild intermittent asthma with (acute) exacerbation 09/01/2018  . 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 Migraine with aura and without status migrainosus, not intractable 09/11/2012  . Variants of migraine, not elsewhere classified, without mention of intractable migraine without mention  of status migrainosus 09/11/2012  . Unspecified constipation 09/11/2012  . Syncope 08/31/2010  . Neck Pain 08/31/2010  . Insomnia 08/31/2010    Past Surgical History:  Procedure Laterality Date  . TONSILLECTOMY  2012  . WISDOM TOOTH EXTRACTION       OB History    Gravida  0   Para  0   Term  0   Preterm  0   AB  0   Living  0     SAB  0   TAB  0   Ectopic  0   Multiple  0   Live Births  0            Home Medications    Prior to Admission medications   Medication Sig Start Date End Date Taking? Authorizing Provider  albuterol (VENTOLIN HFA) 108 (90 Base) MCG/ACT inhaler Inhale 2 puffs into the lungs every 6 (six) hours as needed for wheezing or shortness of breath. 11/25/18  Yes Kozlow, Alvira PhilipsEric J, MD  fluticasone (FLOVENT HFA) 110 MCG/ACT inhaler Inhale 1 puff into the lungs 2 (two) times daily.   Yes [provider]  Fluticasone Furoate (ARNUITY ELLIPTA) 100 MCG/ACT AEPB Inhale 1 Dose into the lungs daily. 11/25/18  Yes Kozlow, Alvira PhilipsEric J, MD  montelukast (SINGULAIR) 10 MG tablet Take 1 tablet (10 mg total) by mouth at bedtime. 11/12/18  Yes Sharlene DoryWendling, Nicholas Paul, DO  omeprazole (PRILOSEC) 40 MG capsule  Take 1 capsule (40 mg total) by mouth daily. 11/25/18  Yes Kozlow, Donnamarie Poag, MD  SUMAtriptan (IMITREX) 50 MG tablet May repeat in 2 hours if headache persists or recurs. (max 2 tabs/24 hrs) 11/22/18  Yes Debbrah Alar, NP  famotidine (PEPCID) 40 MG tablet Take 1 tablet (40 mg total) by mouth daily. Patient not taking: Reported on 12/03/2018 11/25/18   Jiles Prows, MD  ibuprofen (ADVIL) 600 MG tablet Take 1 tablet (600 mg total) by mouth every 6 (six) hours as needed. Patient not taking: Reported on 12/03/2018 11/10/18   Couture, Cortni S, PA-C  meloxicam (MOBIC) 7.5 MG tablet Take 1 tablet (7.5 mg total) by mouth daily. Do not take with ibuprofen Patient not taking: Reported on 12/03/2018 11/22/18   Debbrah Alar, NP  Olopatadine HCl (PATADAY) 0.2 % SOLN  Place 1 drop into both eyes 1 day or 1 dose. Patient not taking: Reported on 12/03/2018 11/25/18   Jiles Prows, MD    Family History Family History  Problem Relation Age of Onset  . Migraines Paternal Grandmother   . Migraines Paternal Grandfather   . HIV Father   . Diabetes Mother   . Hypertension Mother   . Anemia Mother   . COPD Mother   . CVA Mother   . Congestive Heart Failure Mother   . Cirrhosis Mother   . Obesity Mother   . Ulcers Brother   . Anemia Sister   . Cholecystitis Brother   . Migraines Paternal Aunt   . Migraines Maternal Uncle        Maternal Great Uncle  . Diabetes Other        Family History  . Hypertension Other        Family History  . Depression Other        Family History  . Asthma Other        Family History  . Allergic rhinitis Other        Family History  . Asthma Brother     Social History Social History   Tobacco Use  . Smoking status: Never Smoker  . Smokeless tobacco: Never Used  Substance Use Topics  . Alcohol use: Not Currently  . Drug use: No     Allergies   Patient has no known allergies.   Review of Systems Review of Systems  Cardiovascular: Positive for chest pain.  Ten systems reviewed and are negative for acute change, except as noted in the HPI.    Physical Exam Updated Vital Signs BP 103/65   Pulse 62   Temp 98.3 F (36.8 C) (Oral)   Resp 20   Ht 4\' 9"  (1.448 m)   Wt 56.2 kg   LMP 11/01/2018   SpO2 98%   BMI 26.83 kg/m   Physical Exam Vitals signs and nursing note reviewed.  Constitutional:      General: She is not in acute distress.    Appearance: She is well-developed. She is not diaphoretic.     Comments: Nontoxic appearing and in NAD  HENT:     Head: Normocephalic and atraumatic.  Eyes:     General: No scleral icterus.    Conjunctiva/sclera: Conjunctivae normal.  Neck:     Musculoskeletal: Normal range of motion.  Cardiovascular:     Rate and Rhythm: Normal rate and regular rhythm.      Pulses: Normal pulses.  Pulmonary:     Effort: Pulmonary effort is normal. No respiratory distress.     Comments:  No TTP to the chest wall. Respirations even and unlabored. Musculoskeletal: Normal range of motion.  Skin:    General: Skin is warm and dry.     Coloration: Skin is not pale.     Findings: No erythema or rash.  Neurological:     General: No focal deficit present.     Mental Status: She is alert and oriented to person, place, and time.     Coordination: Coordination normal.  Psychiatric:        Behavior: Behavior normal.      ED Treatments / Results  Labs (all labs ordered are listed, but only abnormal results are displayed) Labs Reviewed  CBC WITH DIFFERENTIAL/PLATELET  TROPONIN I  BASIC METABOLIC PANEL  TSH  I-STAT BETA HCG BLOOD, ED ([facility], [facility], [facility] ONLY)    EKG EKG Interpretation  Date/Time:  Wednesday December 03 2018 05:42:37 EDT Ventricular Rate:  68 PR Interval:    QRS Duration: 78 QT Interval:  382 QTC Calculation: 407 R Axis:   42 Text Interpretation:  Sinus rhythm Low voltage, precordial leads No significant change since last tracing Confirmed by Rochele RaringWard, Kristen [phone]([id number]) on 12/03/2018 5:46:42 AM   Radiology Dg Chest 2 View  Result Date: 12/03/2018 CLINICAL DATA:  Left chest pain beginning at 3:30 a.m. EXAM: CHEST - 2 VIEW COMPARISON:  Two-view chest x-ray 11/13/2018 FINDINGS: The heart size is normal. Lungs are clear. There is no edema or effusion. No focal airspace disease present. Mild leftward curvature is present in the thoracic spine. IMPRESSION: Negative two view chest x-ray. Electronically Signed   By: Marin Robertshristopher  Mattern M.D.   On: 12/03/2018 06:30    Procedures Procedures (including critical care time)  Medications Ordered in ED Medications - No data to display   Initial Impression / Assessment and Plan / ED Course  I have reviewed the triage vital signs and the nursing notes.  Pertinent labs & imaging results that were available during  my care of the patient were reviewed by me and considered in my medical decision making (see chart for details).        Patient presents to the emergency department for evaluation of chest pain.  Low suspicion for emergent cardiac etiology given reassuring workup today.  EKG is nonischemic and troponin negative.  Chest x-ray without evidence of mediastinal widening to suggest dissection.  No pneumothorax, pneumonia, pleural effusion.  Pulmonary embolus further considered; however, patient without tachycardia, tachypnea, dyspnea, hypoxia.  Patient is PERC negative.  Patient to continue use of Tylenol or ibuprofen for pain.  Encouraged follow-up with her primary care doctor if symptoms persist.  Return precautions discussed and provided. Patient discharged in stable condition with no unaddressed concerns.  Vitals:   12/03/18 0540 12/03/18 0545 12/03/18 0615 12/03/18 0630  BP: 133/84 118/78 107/71 103/65  Pulse: 91  64 62  Resp: 17 13 (!) 22 20  Temp: 98.3 F (36.8 C)     TempSrc: Oral     SpO2: 100%  98% 98%  Weight:      Height:        Final Clinical Impressions(s) / ED Diagnoses   Final diagnoses:  Nonspecific chest pain    ED Discharge Orders    None       Antony MaduraHumes, Kashina Mecum, PA-C 12/03/18 0703    Ward, Layla MawKristen N, DO 12/03/18 0720

## 2018-12-03 NOTE — Telephone Encounter (Signed)
Please advise 

## 2018-12-03 NOTE — Telephone Encounter (Signed)
Reason for CRM: Pt called stating she went to ER for chest pain that woke her out of her sleep. She was released from ER stating normal EKG and thyroid labs. She said "they did see some PVCs". Pt said she is worried about a spine curvature they found. Pt requesting call from [place] to review results and see if she should still have thyroid US.

## 2018-12-03 NOTE — Telephone Encounter (Signed)
Spoke to pt. Reviewed spinal curvature and that this is likely old and not worrisome. Advised her I would still like her to complete a thyroid US. Wonders what to do for chest pain. Recommended aleve bid for next several days and let me know if no improvement.  Pt verbalizes understanding.

## 2018-12-03 NOTE — ED Triage Notes (Signed)
Pt says she woke up around 0330 with pain in the left chest, pain became less, but it is still constant. Pt took 81mg  ASA for the same. She then started having pain in the left hand. Pt reports she has been having heart palpitations for about 2 weeks, saw her PCP for the same yesterday.

## 2018-12-03 NOTE — Discharge Instructions (Signed)
Your work-up in the emergency department was reassuring and did not reveal a concerning cause of your symptoms.  We recommend follow-up with your primary care doctor.  You may take Tylenol or ibuprofen for any persistent pain.  You may return for any new or concerning symptoms.

## 2018-12-05 ENCOUNTER — Telehealth: Payer: Self-pay

## 2018-12-05 NOTE — Telephone Encounter (Signed)
Copied from Maria Smith [phone]. Topic: General - Other >> Dec 05, 2018  2:14 PM Rainey Pines A wrote: Patient would like a callback in regards to her lab appoitnment that is scheduled 12/08/2018. Patient had labs done on 12/03/2018 at hospital and wants to know if she needs to cancel this appointment.

## 2018-12-05 NOTE — Telephone Encounter (Signed)
Please contact pt and advise her OK to cancel lab appointment since these labs were addressed at her ER visit.

## 2018-12-05 NOTE — Telephone Encounter (Signed)
Lab appoint ment cancelled, patient advised

## 2018-12-07 DIAGNOSIS — Z20828 Contact with and (suspected) exposure to other viral communicable diseases: Secondary | ICD-10-CM | POA: Diagnosis not present

## 2018-12-08 ENCOUNTER — Other Ambulatory Visit: Payer: BC Managed Care – PPO

## 2018-12-09 ENCOUNTER — Encounter: Payer: Self-pay | Admitting: Family

## 2018-12-09 DIAGNOSIS — R0781 Pleurodynia: Secondary | ICD-10-CM

## 2018-12-09 DIAGNOSIS — R0789 Other chest pain: Secondary | ICD-10-CM

## 2018-12-10 ENCOUNTER — Encounter (HOSPITAL_BASED_OUTPATIENT_CLINIC_OR_DEPARTMENT_OTHER): Payer: Self-pay

## 2018-12-10 ENCOUNTER — Ambulatory Visit (HOSPITAL_BASED_OUTPATIENT_CLINIC_OR_DEPARTMENT_OTHER)
Admission: RE | Admit: 2018-12-10 | Discharge: 2018-12-10 | Disposition: A | Discharge status: Home / Self Care | Payer: BC Managed Care – PPO | Source: Ambulatory Visit | Attending: Family | Admitting: Family

## 2018-12-10 ENCOUNTER — Other Ambulatory Visit: Payer: Self-pay

## 2018-12-10 DIAGNOSIS — R0781 Pleurodynia: Secondary | ICD-10-CM

## 2018-12-10 DIAGNOSIS — E0789 Other specified disorders of thyroid: Secondary | ICD-10-CM | POA: Diagnosis not present

## 2018-12-10 DIAGNOSIS — R0789 Other chest pain: Secondary | ICD-10-CM

## 2018-12-10 DIAGNOSIS — E049 Nontoxic goiter, unspecified: Secondary | ICD-10-CM | POA: Insufficient documentation

## 2018-12-10 DIAGNOSIS — R079 Chest pain, unspecified: Secondary | ICD-10-CM | POA: Diagnosis not present

## 2018-12-10 MED ORDER — IOHEXOL 350 MG/ML SOLN
100.0000 mL | Freq: Once | INTRAVENOUS | Status: AC | PRN
  Administered 2018-12-10: 100 mL via INTRAVENOUS

## 2018-12-11 ENCOUNTER — Encounter: Payer: Self-pay | Admitting: Family

## 2018-12-11 ENCOUNTER — Telehealth: Payer: Self-pay | Admitting: Family

## 2018-12-11 ENCOUNTER — Ambulatory Visit: Payer: Self-pay

## 2018-12-11 DIAGNOSIS — K760 Fatty (change of) liver, not elsewhere classified: Secondary | ICD-10-CM

## 2018-12-11 DIAGNOSIS — E010 Iodine-deficiency related diffuse (endemic) goiter: Secondary | ICD-10-CM

## 2018-12-11 HISTORY — DX: Iodine-deficiency related diffuse (endemic) goiter: E01.0

## 2018-12-11 HISTORY — DX: Fatty (change of) liver, not elsewhere classified: K76.0

## 2018-12-11 MED ORDER — MELOXICAM 7.5 MG PO TABS: 7.5000 mg | ORAL_TABLET | Freq: Every day | ORAL | 0 refills | Status: DC

## 2018-12-11 NOTE — Telephone Encounter (Signed)
In coming call from Patient wants to know why does Debbrah Alar think the thyroid is enlarged? How long will it be enlarged.  .  Pt states she has already lost 6. pounds .  What can she do to make it better.

## 2018-12-11 NOTE — Telephone Encounter (Signed)
Copied from [place] [phone]. Topic: General - Other >> Dec 11, 2018  8:30 AM Ivar Drape wrote: Reason for CRM:  Patient called in for the imaging results she took yesterday.  Please contact pt and let her know that her CT scan is negative for clot. Lungs appear normal. I do believe that her pain is related to musculoskeletal pain. I sent an rx for meloxican (anti-inflammatory) to her pharmacy for short term use. This should help her pain.   Also, her thyroid appears overall to be enlarged but there are not concerning nodules noted.  Her thyroid function is normal which is good.

## 2018-12-12 NOTE — Telephone Encounter (Signed)
See my chart message

## 2018-12-12 NOTE — Telephone Encounter (Signed)
Patient advised of results and to pick up rx

## 2018-12-13 ENCOUNTER — Encounter: Payer: Self-pay | Admitting: Family

## 2018-12-14 MED ORDER — FLUCONAZOLE 150 MG PO TABS: ORAL_TABLET | ORAL | 0 refills | Status: DC

## 2018-12-15 NOTE — Progress Notes (Signed)
23 y.o. G0P0000 Married (to a female) Black or [place] Not Hispanic or Latino female here for annual exam.   She has a h/o oligomenorrhea (started last year), uses cyclic provera. Prior normal TSH and prolactin. No hirsutism or acne. PCO appearing ovaries on ultrasound.  She has been working on weight loss, is down 7 lbs since last month. She was recently diagnosed with a fatty liver which helped motivate her to loose weight.  She has had issues with recurrent ovarian cysts.  She hasn't been cycling on her own, using the provera.  Period Duration (Days): 5 days Period Pattern: (!) Irregular Menstrual Flow: Moderate Menstrual Control: Thin pad Menstrual Control Change Freq (Hours): changes pad every 3-4 hours Dysmenorrhea: (!) Moderate Dysmenorrhea Symptoms: Cramping Patient's last menstrual period was 11/01/2018 (approximate).   Mom had a stroke last month, speech is getting better, walk with assist.   She and her wife are trying to have a baby, they have a donor, trying at home. Wife will get pregnant first.          Sexually active: Yes.    The current method of family planning is none.    Exercising: Yes.    dancing, walking Smoker:  no  Health Maintenance: Pap:  06/26/2017 WNL TDaP:  06/18/2013 Gardasil: completed all 3    reports that she has never smoked. She has never used smokeless tobacco. She reports previous alcohol use. She reports that she does not use drugs. 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 Wife is a Runner, broadcasting/film/videoteacher (daycare). They are both still working during covid.    Past Medical History:  Diagnosis Date  . Allergic rhinitis   . Asthma   . Eczema   . Environmental allergies    Age 23 years  . Fatty liver 12/11/2018   Noted on CT 6/20  . Generalized headaches    Began at age 23 years  . Influenza    Age 23 years  . 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 Streptococcal infection(041.00)    Age 23 and 23 years old  . Thyromegaly  12/11/2018   Diffuse enlargement on US 6/20    Past Surgical History:  Procedure Laterality Date  . TONSILLECTOMY  2012  . WISDOM TOOTH EXTRACTION      Current Outpatient Medications  Medication Sig Dispense Refill  . albuterol (VENTOLIN HFA) 108 (90 Base) MCG/ACT inhaler Inhale 2 puffs into the lungs every 6 (six) hours as needed for wheezing or shortness of breath. 2 Inhaler 3  . fluticasone (FLOVENT HFA) 110 MCG/ACT inhaler Inhale 1 puff into the lungs 2 (two) times daily.    . Fluticasone Furoate (ARNUITY ELLIPTA) 100 MCG/ACT AEPB Inhale 1 Dose into the lungs daily. 30 each 5  . meloxicam (MOBIC) 7.5 MG tablet Take 1 tablet (7.5 mg total) by mouth daily. 14 tablet 0  . omeprazole (PRILOSEC) 40 MG capsule Take 1 capsule (40 mg total) by mouth daily. 30 capsule 5   No current facility-administered medications for this visit.     Family History  Problem Relation Age of Onset  . Migraines Paternal Grandmother   . Migraines Paternal Grandfather   . HIV Father   . Diabetes Mother   . Hypertension Mother   . Anemia Mother   . COPD Mother   . CVA Mother   . Congestive Heart Failure Mother   . Cirrhosis Mother   . Obesity Mother   . Ulcers  Brother   . Anemia Sister   . Cholecystitis Brother   . Migraines Paternal Aunt   . Migraines Maternal Uncle        Maternal Great Uncle  . Diabetes Other        Family History  . Hypertension Other        Family History  . Depression Other        Family History  . Asthma Other        Family History  . Allergic rhinitis Other        Family History  . Asthma Brother     Review of Systems  Constitutional: Negative.   HENT: Negative.   Eyes: Negative.   Respiratory: Negative.   Cardiovascular: Negative.   Gastrointestinal: Negative.   Endocrine: Negative.   Genitourinary: Positive for menstrual problem.  Musculoskeletal: Negative.   Skin: Negative.   Allergic/Immunologic: Negative.   Neurological: Negative.   Hematological:  Negative.   Psychiatric/Behavioral: Negative.     Exam:   BP 118/80 (BP Location: Right Arm, Patient Position: Sitting, Cuff Size: Normal)   Pulse 72   Temp 98.7 F (37.1 C) (Skin)   Ht 4\' 9"  (1.448 m)   Wt 122 lb 12.8 oz (55.7 kg)   LMP 11/01/2018 (Approximate)   BMI 26.57 kg/m   Weight change: @WEIGHTCHANGE @ Height:   Height: 4\' 9"  (144.8 cm)  Ht Readings from Last 3 Encounters:  12/16/18 4\' 9"  (1.448 m)  12/03/18 4\' 9"  (1.448 m)  11/10/18 4\' 10"  (1.473 m)    General appearance: alert, cooperative and appears stated age Head: Normocephalic, without obvious abnormality, atraumatic Neck: no adenopathy, supple, symmetrical, trachea midline and thyroid normal to inspection and palpation. I don't appreciate thyromegaly on exam.  Lungs: clear to auscultation bilaterally Cardiovascular: regular rate and rhythm Breasts: normal appearance, no masses or tenderness Abdomen: soft, non-tender; non distended,  no masses,  no organomegaly Extremities: extremities normal, atraumatic, no cyanosis or edema Skin: Skin color, texture, turgor normal. No rashes or lesions Lymph nodes: Cervical, supraclavicular, and axillary nodes normal. No abnormal inguinal nodes palpated Neurologic: Grossly normal   Pelvic: External genitalia:  no lesions              Urethra:  normal appearing urethra with no masses, tenderness or lesions              Bartholins and Skenes: normal                 Vagina: normal appearing vagina with normal color and discharge, no lesions              Cervix: no lesions               Bimanual Exam:  Uterus:  normal size, contour, position, consistency, mobility, non-tender              Adnexa: no mass, fullness, tenderness               Rectovaginal: Confirms               Anus:  normal sphincter tone, no lesions  Chaperone was present for exam.  A:  Well Woman with normal exam  PCOS, she has been loosing weight.   H/O Oligomenorrhea  H/O thyromegaly, normal TSH, U/S  nonspecific. Followed by primary.     P:   No pap this year  Screening labs with primary  Continue cyclic provera  Discussed breast self exam  Discussed calcium  and vit D intake  Declines STD testing

## 2018-12-16 ENCOUNTER — Ambulatory Visit (INDEPENDENT_AMBULATORY_CARE_PROVIDER_SITE_OTHER): Payer: BC Managed Care – PPO | Admitting: Obstetrics and Gynecology

## 2018-12-16 ENCOUNTER — Encounter: Payer: Self-pay | Admitting: Obstetrics and Gynecology

## 2018-12-16 ENCOUNTER — Other Ambulatory Visit: Payer: Self-pay

## 2018-12-16 VITALS — BP 118/80 | HR 72 | Temp 98.7°F | Ht <= 58 in | Wt 122.8 lb

## 2018-12-16 DIAGNOSIS — Z01419 Encounter for gynecological examination (general) (routine) without abnormal findings: Secondary | ICD-10-CM | POA: Diagnosis not present

## 2018-12-16 DIAGNOSIS — N914 Secondary oligomenorrhea: Secondary | ICD-10-CM | POA: Diagnosis not present

## 2018-12-16 DIAGNOSIS — E282 Polycystic ovarian syndrome: Secondary | ICD-10-CM | POA: Diagnosis not present

## 2018-12-16 MED ORDER — MEDROXYPROGESTERONE ACETATE 5 MG PO TABS: ORAL_TABLET | ORAL | 3 refills | Status: DC

## 2018-12-16 NOTE — Patient Instructions (Signed)
EXERCISE AND DIET:  We recommended that you start or continue a regular exercise program for good health. Regular exercise means any activity that makes your heart beat faster and makes you sweat.  We recommend exercising at least 30 minutes per day at least 3 days a week, preferably 4 or 5.  We also recommend a diet low in fat and sugar.  Inactivity, poor dietary choices and obesity can cause diabetes, heart attack, stroke, and kidney damage, among others.    ALCOHOL AND SMOKING:  Women should limit their alcohol intake to no more than 7 drinks/beers/glasses of wine (combined, not each!) per week. Moderation of alcohol intake to this level decreases your risk of breast cancer and liver damage. And of course, no recreational drugs are part of a healthy lifestyle.  And absolutely no smoking or even second hand smoke. Most people know smoking can cause heart and lung diseases, but did you know it also contributes to weakening of your bones? Aging of your skin?  Yellowing of your teeth and nails?  CALCIUM AND VITAMIN D:  Adequate intake of calcium and Vitamin D are recommended.  The recommendations for exact amounts of these supplements seem to change often, but generally speaking 1,000 mg of calcium (between diet and supplement) and 800 units of Vitamin D per day seems prudent. Certain women may benefit from higher intake of Vitamin D.  If you are among these women, your doctor will have told you during your visit.    PAP SMEARS:  Pap smears, to check for cervical cancer or precancers,  have traditionally been done yearly, although recent scientific advances have shown that most women can have pap smears less often.  However, every woman still should have a physical exam from her gynecologist every year. It will include a breast check, inspection of the vulva and vagina to check for abnormal growths or skin changes, a visual exam of the cervix, and then an exam to evaluate the size and shape of the uterus and  ovaries.  And after 23 years of age, a rectal exam is indicated to check for rectal cancers. We will also provide age appropriate advice regarding health maintenance, like when you should have certain vaccines, screening for sexually transmitted diseases, bone density testing, colonoscopy, mammograms, etc.   MAMMOGRAMS:  All women over 40 years old should have a yearly mammogram. Many facilities now offer a "3D" mammogram, which may cost around $50 extra out of pocket. If possible,  we recommend you accept the option to have the 3D mammogram performed.  It both reduces the number of women who will be called back for extra views which then turn out to be normal, and it is better than the routine mammogram at detecting truly abnormal areas.    COLON CANCER SCREENING: Now recommend starting at age 45. At this time colonoscopy is not covered for routine screening until 50. There are take home tests that can be done between 45-49.   COLONOSCOPY:  Colonoscopy to screen for colon cancer is recommended for all women at age 50.  We know, you hate the idea of the prep.  We agree, BUT, having colon cancer and not knowing it is worse!!  Colon cancer so often starts as a polyp that can be seen and removed at colonscopy, which can quite literally save your life!  And if your first colonoscopy is normal and you have no family history of colon cancer, most women don't have to have it again for   10 years.  Once every ten years, you can do something that may end up saving your life, right?  We will be happy to help you get it scheduled when you are ready.  Be sure to check your insurance coverage so you understand how much it will cost.  It may be covered as a preventative service at no cost, but you should check your particular policy.      Breast Self-Awareness Breast self-awareness means being familiar with how your breasts look and feel. It involves checking your breasts regularly and reporting any changes to your  health care provider. Practicing breast self-awareness is important. A change in your breasts can be a sign of a serious medical problem. Being familiar with how your breasts look and feel allows you to find any problems early, when treatment is more likely to be successful. All women should practice breast self-awareness, including women who have had breast implants. How to do a breast self-exam One way to learn what is normal for your breasts and whether your breasts are changing is to do a breast self-exam. To do a breast self-exam: Look for Changes  1. Remove all the clothing above your waist. 2. Stand in front of a mirror in a room with good lighting. 3. Put your hands on your hips. 4. Push your hands firmly downward. 5. Compare your breasts in the mirror. Look for differences between them (asymmetry), such as: ? Differences in shape. ? Differences in size. ? Puckers, dips, and bumps in one breast and not the other. 6. Look at each breast for changes in your skin, such as: ? Redness. ? Scaly areas. 7. Look for changes in your nipples, such as: ? Discharge. ? Bleeding. ? Dimpling. ? Redness. ? A change in position. Feel for Changes Carefully feel your breasts for lumps and changes. It is best to do this while lying on your back on the floor and again while sitting or standing in the shower or tub with soapy water on your skin. Feel each breast in the following way:  Place the arm on the side of the breast you are examining above your head.  Feel your breast with the other hand.  Start in the nipple area and make  inch (2 cm) overlapping circles to feel your breast. Use the pads of your three middle fingers to do this. Apply light pressure, then medium pressure, then firm pressure. The light pressure will allow you to feel the tissue closest to the skin. The medium pressure will allow you to feel the tissue that is a little deeper. The firm pressure will allow you to feel the tissue  close to the ribs.  Continue the overlapping circles, moving downward over the breast until you feel your ribs below your breast.  Move one finger-width toward the center of the body. Continue to use the  inch (2 cm) overlapping circles to feel your breast as you move slowly up toward your collarbone.  Continue the up and down exam using all three pressures until you reach your armpit.  Write Down What You Find  Write down what is normal for each breast and any changes that you find. Keep a written record with breast changes or normal findings for each breast. By writing this information down, you do not need to depend only on memory for size, tenderness, or location. Write down where you are in your menstrual cycle, if you are still menstruating. If you are having trouble noticing differences   in your breasts, do not get discouraged. With time you will become more familiar with the variations in your breasts and more comfortable with the exam. How often should I examine my breasts? Examine your breasts every month. If you are breastfeeding, the best time to examine your breasts is after a feeding or after using a breast pump. If you menstruate, the best time to examine your breasts is 5-7 days after your period is over. During your period, your breasts are lumpier, and it may be more difficult to notice changes. When should I see my health care provider? See your health care provider if you notice:  A change in shape or size of your breasts or nipples.  A change in the skin of your breast or nipples, such as a reddened or scaly area.  Unusual discharge from your nipples.  A lump or thick area that was not there before.  Pain in your breasts.  Anything that concerns you.  

## 2018-12-17 ENCOUNTER — Ambulatory Visit: Payer: BLUE CROSS/BLUE SHIELD | Admitting: Obstetrics and Gynecology

## 2018-12-18 ENCOUNTER — Encounter: Payer: Self-pay | Admitting: Family

## 2018-12-20 ENCOUNTER — Ambulatory Visit (INDEPENDENT_AMBULATORY_CARE_PROVIDER_SITE_OTHER)
Admission: RE | Admit: 2018-12-20 | Discharge: 2018-12-20 | Disposition: A | Discharge status: Home / Self Care | Payer: BC Managed Care – PPO | Source: Ambulatory Visit

## 2018-12-20 DIAGNOSIS — F411 Generalized anxiety disorder: Secondary | ICD-10-CM

## 2018-12-20 DIAGNOSIS — J4530 Mild persistent asthma, uncomplicated: Secondary | ICD-10-CM

## 2018-12-20 MED ORDER — ALBUTEROL SULFATE HFA 108 (90 BASE) MCG/ACT IN AERS: 2.0000 | INHALATION_SPRAY | Freq: Four times a day (QID) | RESPIRATORY_TRACT | 0 refills | Status: DC | PRN

## 2018-12-20 MED ORDER — HYDROXYZINE HCL 25 MG PO TABS: 25.0000 mg | ORAL_TABLET | Freq: Four times a day (QID) | ORAL | 0 refills | Status: DC

## 2018-12-20 NOTE — ED Provider Notes (Signed)
Virtual Visit via Video Note:  Maria Smith  initiated request for Telemedicine visit with [facility] team. I connected with Maria Smith  on 12/20/2018 at 4:38 PM  for a synchronized telemedicine visit using a video enabled HIPPA compliant telemedicine application. I verified that I am speaking with Maria Smith  using two identifiers. [place]any Hall-Potvin, PA-C  was physically located in a [facility] site and [place] C Hayden RasmussenMcQueen Gallardo was located at a different location.   The limitations of evaluation and management by telemedicine as well as the availability of in-person appointments were discussed. Patient was informed that she  may incur a bill ( including co-pay) for this virtual visit encounter. [name] Menz  expressed understanding and gave verbal consent to proceed with virtual visit.     History of Present Illness:Maria Smith  is a 23 y.o. female presents with concern of right-sided chest pain.  Patient states the pain is "just right of my sternum "and will radiate across her right chest laterally.  States this is been going on for over 2 months.  Patient denies left-sided chest pain, shortness of breath, wheezing, lightheadedness, nausea, abdominal pain during these episodes.  Patient states that this comes and goes, no clear inciting or alleviating factors.  Patient states that she is also had some intermittent foreign body sensation in her throat without choking, drooling, change in voice for the last 2 weeks.  No fever, chills, myalgias, history of strep, sick contacts. Of note, patient has been seen numerous medical providers (including primary care, urgent care, emergency) since March of this year for the same chest complaint.  Patient has had extensive work-up including EKGs, cardiac enzymes, CT scans.  Patient has mild thyromegaly with recent normal TSH.  Otherwise work-up has been unremarkable.  Patient  takes meloxicam and/or muscle relaxers with minimal improvement.  On further questioning, patient admits to being stressed.  Patient works in a nursing home, has been tested for COVID twice since March, both negative.  Patient feels that she is always been quick to worry, depression anxiety run in her family.  Patient denies previous SI, self harming behavior.  No current SI/HI.  Has not yet discussed this possible etiology/contributing factor with her primary care.  Patient intends on following up with her primary care Monday to discuss this further.  Past Medical History:  Diagnosis Date  . Allergic rhinitis   . Asthma   . Eczema   . Environmental allergies    Age 23 years  . Fatty liver 12/11/2018   Noted on CT 6/20  . Generalized headaches    Began at age 23 years  . Influenza    Age 87 years  . 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 Streptococcal infection(041.00)    Age 23 and 23 years old  . 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/2018   Diffuse enlargement on US 6/20    No Known Allergies      Observations/Objective: 23 year old female sitting in no acute distress.  Patient is pleasant throughout appointment in affect and is able to speak in full sentences without coughing, wheezing, sneezing.  Assessment and Plan: 23 year old female with chronic, intermittent right-sided chest pain.  Patient has had extensive work-up for this issue, without determined etiology.  Patient feels that she is stable from CT scan done on 12/10/2018.  Feels there may be merit to possible anxiety state contributing.  Patient denies SI/HI.  Will trial hydroxyzine as needed when she feels her symptoms.  Patient to keep a log and report to her PCP.  Follow Up Instructions: Patient to follow-up with PCP on Monday regarding possible SSRI/SNRI initiation.   I discussed the assessment and treatment plan with the patient. The patient was provided an opportunity to ask questions and all were answered. The patient  agreed with the plan and demonstrated an understanding of the instructions.   The patient was advised to call back or seek an in-person evaluation if the symptoms worsen or if the condition fails to improve as anticipated.  I provided 25 minutes of non-face-to-face time during this encounter.    [place] Hall-Potvin, PA-C  12/20/2018 4:38 PM        Hall-Potvin, [place], [place] 12/20/18 1638

## 2018-12-21 DIAGNOSIS — Z20828 Contact with and (suspected) exposure to other viral communicable diseases: Secondary | ICD-10-CM | POA: Diagnosis not present

## 2018-12-22 ENCOUNTER — Ambulatory Visit (INDEPENDENT_AMBULATORY_CARE_PROVIDER_SITE_OTHER): Payer: BC Managed Care – PPO | Admitting: Family

## 2018-12-22 ENCOUNTER — Other Ambulatory Visit: Payer: Self-pay

## 2018-12-22 DIAGNOSIS — R0789 Other chest pain: Secondary | ICD-10-CM | POA: Diagnosis not present

## 2018-12-22 DIAGNOSIS — F419 Anxiety disorder, unspecified: Secondary | ICD-10-CM

## 2018-12-22 DIAGNOSIS — M940 Chondrocostal junction syndrome [Tietze]: Secondary | ICD-10-CM | POA: Diagnosis not present

## 2018-12-22 MED ORDER — SERTRALINE HCL 50 MG PO TABS: ORAL_TABLET | ORAL | 0 refills | Status: DC

## 2018-12-22 NOTE — Telephone Encounter (Signed)
Scheduled for virtual visit today ?

## 2018-12-22 NOTE — Telephone Encounter (Signed)
Please contact pt to schedule a virtual visit today.  ?

## 2018-12-22 NOTE — Progress Notes (Signed)
Virtual Visit via Video Note  I connected with Maria Smith on 12/22/18 at 10:40 AM EDT by a video enabled telemedicine application and verified that I am speaking with the correct person using two identifiers.  Location: Patient: home Provider: home   I discussed the limitations of evaluation and management by telemedicine and the availability of in person appointments. The patient expressed understanding and agreed to proceed.  History of Present Illness:  Patient is a 23 yr old female who presents today for ED follow up. She was seen in the ED on 12/20/18 with ongoing chronic intermittent right sided chest pain.  Had Neg CTA on 01/09/19.   She reports that she went to [facility] ED.  She reports that she had a EKG which was reportedly normal. Records currently unavailable for review in care everywhere.  She had covid testing through [facility] yesterday.  Reports that she continues to have right sided chest pain.  She notes some mild cough due to mild sore throat.      Reports increased stress due to working around people constantly.  Notes that she has had increased anxiety and worry about contracting covid-19 despite taking all precautions.  She is worried about getting sick and bringing an illness home to her wife. Reports that she has a family history of anxiety and previous issues a few years back but hoped that she wouldn't need medication. Feels like her anxiety is sometimes interfering with her day to day life and job performance.  Past Medical History:  Diagnosis Date  . Allergic rhinitis   . Asthma   . Eczema   . Environmental allergies    Age 9 years  . Fatty liver 12/11/2018   Noted on CT 6/20  . Generalized headaches    Began at age 42 years  . Influenza    Age 84 years  . 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 Streptococcal infection(041.00)    Age 63 and 23 years old  . 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/2018   Diffuse enlargement on [place] 6/20      Social History   Socioeconomic History  . Marital status: Married    Spouse name: Not on file  . Number of children: Not on file  . Years of education: Not on file  . Highest education level: Not on file  Occupational History  . Not on file  Social Needs  . Financial resource strain: Not on file  . Food insecurity    Worry: Not on file    Inability: Not on file  . Transportation needs    Medical: Not on file    Non-medical: Not on file  Tobacco Use  . Smoking status: Never Smoker  . Smokeless tobacco: Never Used  Substance and Sexual Activity  . Alcohol use: Not Currently  . Drug use: No  . Sexual activity: Yes    Partners: Female    Comment: female partner  Lifestyle  . Physical activity    Days per week: Not on file    Minutes per session: Not on file  . Stress: Not on file  Relationships  . Social Herbalist on phone: Not on file    Gets together: Not on file    Attends religious service: Not on file    Active member of club or organization: Not on file    Attends meetings of clubs or organizations: Not on file    Relationship status: Not  on file  . Intimate partner violence    Fear of current or ex partner: Not on file    Emotionally abused: Not on file    Physically abused: Not on file    Forced sexual activity: Not on file  Other Topics Concern  . 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     Past Surgical History:  Procedure Laterality Date  . TONSILLECTOMY  2012  . WISDOM TOOTH EXTRACTION      Family History  Problem Relation Age of Onset  . Migraines Paternal Grandmother   . Migraines Paternal Grandfather   . HIV Father   . Diabetes Mother   . Hypertension Mother   . Anemia Mother   . COPD Mother   . CVA Mother   .  Congestive Heart Failure Mother   . Cirrhosis Mother   . Obesity Mother   . Ulcers Brother   . Anemia Sister   . Cholecystitis Brother   . Migraines Paternal Aunt   . Migraines Maternal Uncle        Maternal Great Uncle  . Diabetes Other        Family History  . Hypertension Other        Family History  . Depression Other        Family History  . Asthma Other        Family History  . Allergic rhinitis Other        Family History  . Asthma Brother     No Known Allergies  Current Outpatient Medications on File Prior to Visit  Medication Sig Dispense Refill  . albuterol (VENTOLIN HFA) 108 (90 Base) MCG/ACT inhaler Inhale 2 puffs into the lungs every 6 (six) hours as needed for wheezing or shortness of breath. 18 g 0  . fluticasone (FLOVENT HFA) 110 MCG/ACT inhaler Inhale 1 puff into the lungs 2 (two) times daily.    . Fluticasone Furoate (ARNUITY ELLIPTA) 100 MCG/ACT AEPB Inhale 1 Dose into the lungs daily. 30 each 5  . hydrOXYzine (ATARAX/VISTARIL) 25 MG tablet Take 1 tablet (25 mg total) by mouth every 6 (six) hours. 12 tablet 0  . medroxyPROGESTERone (PROVERA) 5 MG tablet Take one tablet a day for 5 days every one to two months if no spontaneous cycles. 15 tablet 3  . meloxicam (MOBIC) 7.5 MG tablet Take 1 tablet (7.5 mg total) by mouth daily. 14 tablet 0  . omeprazole (PRILOSEC) 40 MG capsule Take 1 capsule (40 mg total) by mouth daily. 30 capsule 5   No current facility-administered medications on file prior to visit.     There were no vitals taken for this visit.    Observations/Objective:   Gen: Awake, alert, no acute distress Resp: Breathing is even and non-labored Psych: calm/pleasant demeanor, tearful Neuro: Alert and Oriented x 3, + facial symmetry, speech is clear.   Assessment and Plan:  Anxiety- uncontrolled. Will initiate zoloft 50mg .  I instructed pt to start 1/2 tablet once daily for 1 week and then increase to a full tablet once daily on week two as  tolerated.  We discussed common side effects such as nausea, drowsiness and weight gain.  Also discussed rare but serious side effect  of suicide ideation.  She is instructed to discontinue medication go directly to ED if this occurs.  Pt verbalizes understanding.  Plan follow up in 1 month to evaluate progress.    Costrochondritis- continue prn meloxicam.  Reassurance provided.   Follow Up Instructions:    I discussed the assessment and treatment plan with the patient. The patient was provided an opportunity to ask questions and all were answered. The patient agreed with the plan and demonstrated an understanding of the instructions.   The patient was advised to call back or seek an in-person evaluation if the symptoms worsen or if the condition fails to improve as anticipated.  Lemont FillersMelissa S O'Sullivan, NP

## 2018-12-23 ENCOUNTER — Other Ambulatory Visit: Payer: Self-pay

## 2018-12-23 ENCOUNTER — Ambulatory Visit: Payer: Self-pay | Admitting: *Deleted

## 2018-12-23 ENCOUNTER — Ambulatory Visit (INDEPENDENT_AMBULATORY_CARE_PROVIDER_SITE_OTHER): Payer: BC Managed Care – PPO | Admitting: Allergy and Immunology

## 2018-12-23 DIAGNOSIS — J4530 Mild persistent asthma, uncomplicated: Secondary | ICD-10-CM

## 2018-12-23 DIAGNOSIS — R079 Chest pain, unspecified: Secondary | ICD-10-CM

## 2018-12-23 DIAGNOSIS — J301 Allergic rhinitis due to pollen: Secondary | ICD-10-CM | POA: Diagnosis not present

## 2018-12-23 DIAGNOSIS — H1010 Acute atopic conjunctivitis, unspecified eye: Secondary | ICD-10-CM

## 2018-12-23 DIAGNOSIS — K219 Gastro-esophageal reflux disease without esophagitis: Secondary | ICD-10-CM

## 2018-12-23 DIAGNOSIS — J3089 Other allergic rhinitis: Secondary | ICD-10-CM | POA: Diagnosis not present

## 2018-12-23 DIAGNOSIS — R0789 Other chest pain: Secondary | ICD-10-CM

## 2018-12-23 NOTE — Patient Instructions (Addendum)
  1.  Continue to perform allergen avoidance measures  2.  Continue to treat and prevent inflammation:   A.  OTC Nasacort/Rhinocort-1 spray each nostril daily  B.  Arnuity 100-1 inhalation daily  3.  Continue to treat and prevent reflux:   A.  Omeprazole 40 mg - 1 tablet in a.m.  B.  Famotidine 40 mg - 1 tablet in p.m.  4.  If needed:   A.  OTC antihistamine-cetirizine/loratadine 10 mg - 1 tablet daily  B.  OTC Pataday-1 drop each eye daily  C.  Albuterol HFA 2 inhalations every 4-6 hours  5. Blood - SED, CRP, ANA w/reflex  6. Evaluation with GI for upper endoscopy   7. Barium swallow  8.  Return to clinic in 8 weeks or earlier if problem

## 2018-12-23 NOTE — Telephone Encounter (Signed)
Patient wants to know if she can take iburofen after taking meloxicam this morning for musculoskeletal pain?

## 2018-12-23 NOTE — Progress Notes (Addendum)
[place]   Follow-up Note  Referring Provider: Debbrah Alar, NP Primary Provider: Debbrah Alar, NP Date of Office Visit: 12/23/2018  Subjective:   Maria Smith (DOB: 02/11/1996) is a 23 y.o. female who returns to the [facility] on 12/23/2018 in re-evaluation of the following:  HPI: This is a E-med visit requested by patient who is located at home.  Maria Smith is followed in this clinic for asthma and allergic rhinoconjunctivitis and LPR.  Her last visit to this clinic was her initial evaluation of 25 November 2018.  Had ER evaluation for chest pain with negative work up for cardiac / embolus source. Still with a lot of pain over sternum and side of sternum. Not rotational or pleuritic. If swallow, can have fullness in chest. Diagnosed as costochondritis and treated with NSAID. Duration of pain is 3-4 months. More frequent and longer duration with each month.   No breathing issues. No coughing or wheezing. No need to use SABA.   Nose OK with nasal steroid.  Reflux OK but intermittent heartburn.   Allergies as of 12/23/2018   No Known Allergies     Medication List      albuterol 108 (90 Base) MCG/ACT inhaler Commonly known as: VENTOLIN HFA Inhale 2 puffs into the lungs every 6 (six) hours as needed for wheezing or shortness of breath.   fluticasone 110 MCG/ACT inhaler Commonly known as: FLOVENT HFA Inhale 1 puff into the lungs 2 (two) times daily.   Fluticasone Furoate 100 MCG/ACT Aepb Commonly known as: Arnuity Ellipta Inhale 1 Dose into the lungs daily.   hydrOXYzine 25 MG tablet Commonly known as: ATARAX/VISTARIL Take 1 tablet (25 mg total) by mouth every 6 (six) hours.   medroxyPROGESTERone 5 MG tablet Commonly known as: Provera Take one tablet a day for 5 days every one to two months if no spontaneous cycles.   meloxicam 7.5 MG tablet Commonly known as: MOBIC Take 1 tablet (7.5  mg total) by mouth daily.   omeprazole 40 MG capsule Commonly known as: PRILOSEC Take 1 capsule (40 mg total) by mouth daily.   sertraline 50 MG tablet Commonly known as: Zoloft Take 1/2 tablet by mouth once daily for 1 week, then increase to a full tab once daily on week two       Past Medical History:  Diagnosis Date  . Allergic rhinitis   . Asthma   . Eczema   . Environmental allergies    Age 35 years  . Fatty liver 12/11/2018   Noted on CT 6/20  . Generalized headaches    Began at age 78 years  . Influenza    Age 89 years  . 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 Streptococcal infection(041.00)    Age [age] years old  . 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 TONSILLECTOMY  2012  . WISDOM TOOTH EXTRACTION      Review of systems negative except as noted in HPI / PMHx or noted below:  Review of Systems  Constitutional: Negative.   HENT: Negative.   Eyes: Negative.   Respiratory: Negative.   Cardiovascular: Negative.   Gastrointestinal: Negative.   Genitourinary: Negative.   Musculoskeletal: Negative.   Skin: Negative.   Neurological: Negative.   Endo/Heme/Allergies: Negative.   Psychiatric/Behavioral: Negative.      Objective:  There were no vitals filed for this visit.        Physical Exam-deferred  Diagnostics:    Results of a chest CT angiography obtained 10 December 2018 identified the following:  Cardiovascular: There is no demonstrable pulmonary embolus. There is no appreciable thoracic aortic aneurysm or dissection. The visualized great vessels appear unremarkable. Note that the right innominate and left common carotid arteries arise as a common trunk, an anatomic variant. There is no pericardial effusion or pericardial thickening.  Mediastinum/Nodes: Visualized thyroid appears mildly inhomogeneous without dominant mass. There is no appreciable  thoracic adenopathy. No esophageal lesions are evident.  Lungs/Pleura: The lungs are clear. No pleural effusion or pleural thickening evident.  Assessment and Plan:   1. Not well controlled mild persistent asthma   2. Perennial allergic rhinitis   3. Seasonal allergic rhinitis due to pollen   4. Seasonal allergic conjunctivitis   5. LPRD (laryngopharyngeal reflux disease)   6. Chest pain of uncertain etiology     1.  Continue to perform allergen avoidance measures  2.  Continue to treat and prevent inflammation:   A.  OTC Nasacort/Rhinocort-1 spray each nostril daily  B.  Arnuity 100-1 inhalation daily  3.  Continue to treat and prevent reflux:   A.  Omeprazole 40 mg - 1 tablet in a.m.   B.  Famotidine 40 mg - 1 tablet in p.m.  4.  If needed:   A.  OTC antihistamine-cetirizine/loratadine 10 mg - 1 tablet daily  B.  OTC Pataday-1 drop each eye daily  C.  Albuterol HFA 2 inhalations every 4-6 hours  5. Blood - SED, CRP, ANA w/reflex  6. Evaluation with GI for upper endoscopy   7. Barium swallow  8.  Return to clinic in 8 weeks or earlier if problem  Maria Smith's airway issue and reflux issue appears to be better but she is having chest pain.  This is pretty significant chest pain and it does not appear to be a cardiac source.  There is no evidence of pericarditis or pleuritis on her CT scan.  Her empiric therapy for costochondritis has just not worked.  I think we need to now look to see whether or not there is an inflammatory condition contributing to this issue and we will check a few blood tests as noted above.  As well, she does appear to have a swallowing dysfunction and it is quite possible that her pain is originating from her esophagus and will obtain a barium swallow and have her evaluated by GI for an upper endoscopy.  I will see her back in this clinic in 8 weeks or earlier if there is a problem.  Total patient interaction time 25 minutes  Laurette SchimkeEric , MD Allergy  / Immunology [facility]

## 2018-12-23 NOTE — Telephone Encounter (Signed)
Pt questioning I she could take mobic and ibuprofen together. Advised to not take together; ok to take tylenol with mobic. Pt verbalizes understanding. Instructed to CB if any further questions arise.  Answer Assessment - Initial Assessment Questions 1. SYMPTOMS: "Do you have any symptoms?"     no 2. SEVERITY: If symptoms are present, ask "Are they mild, moderate or severe?"     *No Answer*  Protocols used: MEDICATION QUESTION CALL-A-AH

## 2018-12-24 ENCOUNTER — Encounter: Payer: Self-pay | Admitting: Allergy and Immunology

## 2018-12-25 ENCOUNTER — Telehealth: Payer: Self-pay | Admitting: Obstetrics and Gynecology

## 2018-12-25 NOTE — Telephone Encounter (Signed)
Spoke with patient. She is requesting referral to endocrinologist for PCOS work-up. She is interested in fertility in the future and wants complete work-up. Will discuss with Dr.Jertson and call her back.  Routed to provider.

## 2018-12-25 NOTE — Telephone Encounter (Signed)
Patient would like a referral to an endocrinologist.

## 2018-12-25 NOTE — Telephone Encounter (Signed)
If she wants another opinion, I would send her back to Dr Kerin Perna, he is a Systems developer.  I think that she does have PCOS, the symptoms vary in different women, but she has the weight gain, oligomenorrhea and polycystic appearing ovaries. The real risk with PCOS is metabolic syndrome with diabetes and elevated lipids. Loosing weight should help correct PCOS.  One issue with PCOS is preventing overgrowth of the lining of the uterus, which is why I prescribed the provera (birth control pills, the mirena IUD and cyclic provera are all options). Another issue is trying to get pregnant and Dr Kerin Perna would help her with that. He is also an expert in PCOS.

## 2018-12-26 NOTE — Telephone Encounter (Signed)
Patient notified of Dr.Jertson's recommendations below. She states she will call Dr.Yalcinkaya's office to make an appointment(she feels she may have slight balance she needs to pay before they'll make appointment). She will call us if we need to schedule her appointment.

## 2018-12-29 NOTE — Progress Notes (Signed)
Patient is in home. Provider is in office. Start Time: 419 pm End Time: 444 pm

## 2018-12-30 ENCOUNTER — Other Ambulatory Visit: Payer: Self-pay

## 2018-12-30 ENCOUNTER — Ambulatory Visit (INDEPENDENT_AMBULATORY_CARE_PROVIDER_SITE_OTHER): Payer: BC Managed Care – PPO | Admitting: Sports Medicine

## 2018-12-30 ENCOUNTER — Encounter: Payer: Self-pay | Admitting: Sports Medicine

## 2018-12-30 VITALS — BP 92/60 | Ht <= 58 in | Wt 123.0 lb

## 2018-12-30 DIAGNOSIS — M940 Chondrocostal junction syndrome [Tietze]: Secondary | ICD-10-CM | POA: Diagnosis not present

## 2018-12-30 DIAGNOSIS — M542 Cervicalgia: Secondary | ICD-10-CM | POA: Diagnosis not present

## 2018-12-30 DIAGNOSIS — E010 Iodine-deficiency related diffuse (endemic) goiter: Secondary | ICD-10-CM | POA: Diagnosis not present

## 2018-12-30 NOTE — Progress Notes (Addendum)
Subjective:    Patient ID: Maria Smith, female    DOB: [birth year], 23 y.o.   MRN: [id number]  HPI chief complaint: Chest and neck pain  Very pleasant 23 year old female comes in today complaining of 4 months of intermittent chest pain.  She denies any trauma but rather describes a sudden onset of chest pain that began back in March.  Pain is been severe enough that she is been seen in the emergency room a couple of times.  Her last visit was on July 6.  EKG and CT scan of her chest were both normal.  She was diagnosed with costochondritis.  She describes a diffuse pain across the anterior chest which is most noticeable with physical activity such as lifting or pushing.  She states she is able to do cardiopulmonary workouts without too much difficulty but does encounter some pain afterwards.  Her pain can be quite severe.  Today she is pain-free.  She does note that ibuprofen 600 mg helps with her pain.  She mentioned this to her allergist who ordered some blood work, namely inflammatory markers, but these have not been done yet.  She does have a history of asthma but her current symptoms are different in nature than what she has with her asthma attacks.  She does not get short of breath with her chest pain.  She does not notice any wheezing. She also endorses longstanding neck and shoulder pain.  X-rays of her thoracic and cervical spine in 2015 were unremarkable other than a mild scoliotic curve.  Her pain is most noticeable with activity.  No radiating pain into her arms.  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  Review of her chart shows that she also recently underwent an ultrasound of her thyroid which showed thyromegaly consistent with possible thyroiditis versus diffuse goiters change.  No follow-up care has been scheduled for this.  Past medical history is reviewed Medications reviewed Allergies reviewed   Review of Systems As above    Objective:   Physical  Exam  Well-developed, well-nourished.  No acute distress.  Awake alert and oriented x3.  Vital signs reviewed.  HEENT: Moderate thyromegaly.  No palpable nodules.  Neck: Good range of motion.  There is some tenderness to palpation along the T7 vertebrae but no soft tissue swelling.  Diffuse tenderness along the trapezius muscles bilaterally.  No spasm.  No trigger points.  Chest: Normal breathing.  There is diffuse tenderness to palpation along the costochondral area bilaterally.  CT scan of her chest as above      Assessment & Plan:   Chest pain, likely costochondritis Mechanical neck/shoulder pain Thyromegaly  I will order some inflammatory markers today including a sed rate, C-reactive protein, ANA, rheumatoid factor, and anti-CCP antibody.  A referral has been made to endocrinology for her thyromegaly and I will call her with the results of her blood work when available.  Since ibuprofen 600 mg seems to work well for her, she may continue to take that as needed. I think her neck and shoulder pain is postural in nature.  X-rays in 2015 were unremarkable other than a mild scoliotic curve.  I think she would benefit greatly from formal physical therapy but we have decided to defer that for now.  This note was dictated using Dragon naturally speaking software and may contain errors in syntax, spelling, or content which have not been identified prior to signing this note.  Addendum 01/01/2019:  Blood work unremarkable.

## 2018-12-30 NOTE — Addendum Note (Signed)
Addended by: Cyd Silence on: 12/30/2018 03:22 PM   Modules accepted: Orders

## 2018-12-31 ENCOUNTER — Encounter: Payer: Self-pay | Admitting: Family

## 2018-12-31 ENCOUNTER — Ambulatory Visit (INDEPENDENT_AMBULATORY_CARE_PROVIDER_SITE_OTHER): Payer: BC Managed Care – PPO | Admitting: Family

## 2018-12-31 DIAGNOSIS — J309 Allergic rhinitis, unspecified: Secondary | ICD-10-CM

## 2018-12-31 NOTE — Progress Notes (Signed)
Virtual Visit via Video Note  I connected with Maria Smith on 12/31/18 at 12:20 PM EDT by a video enabled telemedicine application and verified that I am speaking with the correct person using two identifiers.  Location: Patient: car Provider: work   I discussed the limitations of evaluation and management by telemedicine and the availability of in person appointments. The patient expressed understanding and agreed to proceed.  History of Present Illness:  Patient reports that for several days she has had a "tickle in my throat which is making her cough." Mostly on the right back side of her throat.  Feels irritated "like dust."  She has some claritin on hand but has not been taking. She is taking benadryl which helps her to sleep.  Notes that she had a coughing spell last night due to the tickle.  tmax 99.4.  Has chest wall tenderness due to costrochondritis otherwise no myalgia. Denies loss of taste of smell.   Past Medical History:  Diagnosis Date  . Allergic rhinitis   . Asthma   . Eczema   . Environmental allergies    Age 23 years  . Fatty liver 12/11/2018   Noted on CT 6/20  . Generalized headaches    Began at age 23 years  . Influenza    Age 57 years  . 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 Streptococcal infection(041.00)    Age 89 and 23 years old  . 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/2018   Diffuse enlargement on US 6/20     Social History   Socioeconomic History  . Marital status: Married    Spouse name: Not on file  . Number of children: Not on file  . Years of education: Not on file  . Highest education level: Not on file  Occupational History  . Not on file  Social Needs  . Financial resource strain: Not on file  . Food insecurity    Worry: Not on file    Inability: Not on file  . Transportation needs    Medical: Not on file    Non-medical: Not on file  Tobacco Use  . Smoking status: Never Smoker  . Smokeless tobacco: Never Used   Substance and Sexual Activity  . Alcohol use: Not Currently  . Drug use: No  . Sexual activity: Yes    Partners: Female    Comment: female partner  Lifestyle  . Physical activity    Days per week: Not on file    Minutes per session: Not on file  . Stress: Not on file  Relationships  . Social Musicianconnections    Talks on phone: Not on file    Gets together: Not on file    Attends religious service: Not on file    Active member of club or organization: Not on file    Attends meetings of clubs or organizations: Not on file    Relationship status: Not on file  . Intimate partner violence    Fear of current or ex partner: Not on file    Emotionally abused: Not on file    Physically abused: Not on file    Forced sexual activity: Not on file  Other Topics Concern  . 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     Past Surgical History:  Procedure Laterality Date  . TONSILLECTOMY  2012  . WISDOM TOOTH EXTRACTION      Family History  Problem Relation Age of Onset  . Migraines Paternal Grandmother   . Migraines Paternal Grandfather   . HIV Father   . Diabetes Mother   . Hypertension Mother   . Anemia Mother   . COPD Mother   . CVA Mother   . Congestive Heart Failure Mother   . Cirrhosis Mother   . Obesity Mother   . Ulcers Brother   . Anemia Sister   . Cholecystitis Brother   . Migraines Paternal Aunt   . Migraines Maternal Uncle        Maternal Great Uncle  . Diabetes Other        Family History  . Hypertension Other        Family History  . Depression Other        Family History  . Asthma Other        Family History  . Allergic rhinitis Other        Family History  . Asthma Brother     No Known Allergies  Current Outpatient Medications on File Prior to  Visit  Medication Sig Dispense Refill  . albuterol (VENTOLIN HFA) 108 (90 Base) MCG/ACT inhaler Inhale 2 puffs into the lungs every 6 (six) hours as needed for wheezing or shortness of breath. 18 g 0  . fluticasone (FLOVENT HFA) 110 MCG/ACT inhaler Inhale 1 puff into the lungs 2 (two) times daily.    . Fluticasone Furoate (ARNUITY ELLIPTA) 100 MCG/ACT AEPB Inhale 1 Dose into the lungs daily. 30 each 5  . hydrOXYzine (ATARAX/VISTARIL) 25 MG tablet Take 1 tablet (25 mg total) by mouth every 6 (six) hours. 12 tablet 0  . medroxyPROGESTERone (PROVERA) 5 MG tablet Take one tablet a day for 5 days every one to two months if no spontaneous cycles. 15 tablet 3  . meloxicam (MOBIC) 7.5 MG tablet Take 1 tablet (7.5 mg total) by mouth daily. 14 tablet 0  . omeprazole (PRILOSEC) 40 MG capsule Take 1 capsule (40 mg total) by mouth daily. 30 capsule 5  . sertraline (ZOLOFT) 50 MG tablet Take 1/2 tablet by mouth once daily for 1 week, then increase to a full tab once daily on week two 30 tablet 0   No current facility-administered medications on file prior to visit.     There were no vitals taken for this visit.     Observations/Objective:   Gen: Awake, alert, no acute distress Resp: Breathing is even and non-labored Psych: calm/pleasant demeanor Neuro: Alert and Oriented x 3, + facial symmetry, speech is clear.   Assessment and Plan:  Allergic rhinitis- patient advised as follows:  Please start claritin once daily. Add flonase 2 sprays each nostril once daily as needed. Call if symptoms worsen or fail to improve.     Follow Up Instructions:    I discussed the assessment and treatment plan with the patient. The patient was provided an opportunity to ask questions and all were answered. The patient agreed with the plan and demonstrated an understanding of the instructions.   The patient was advised to call back or seek an in-person evaluation if the symptoms worsen or if the condition fails to  improve as anticipated.  Nance Pear, NP

## 2018-12-31 NOTE — Patient Instructions (Signed)
Please start claritin once daily. Add flonase 2 sprays each nostril once daily as needed. Call if symptoms worsen or fail to improve.

## 2019-01-01 LAB — ANA: Anti Nuclear Antibody (ANA): NEGATIVE

## 2019-01-01 LAB — SEDIMENTATION RATE: Sed Rate: 4 mm/hr (ref 0–32)

## 2019-01-01 LAB — C-REACTIVE PROTEIN: CRP: 1 mg/L (ref 0–10)

## 2019-01-01 LAB — CYCLIC CITRUL PEPTIDE ANTIBODY, IGG/IGA: Cyclic Citrullin Peptide Ab: 6 units (ref 0–19)

## 2019-01-01 LAB — RHEUMATOID FACTOR: Rhuematoid fact SerPl-aCnc: <10 IU/mL (ref 0.0–13.9)

## 2019-01-05 ENCOUNTER — Telehealth: Payer: Self-pay | Admitting: Family

## 2019-01-05 ENCOUNTER — Ambulatory Visit (INDEPENDENT_AMBULATORY_CARE_PROVIDER_SITE_OTHER): Payer: BC Managed Care – PPO | Admitting: Family

## 2019-01-05 ENCOUNTER — Other Ambulatory Visit: Payer: Self-pay

## 2019-01-05 DIAGNOSIS — M7918 Myalgia, other site: Secondary | ICD-10-CM

## 2019-01-05 DIAGNOSIS — G43909 Migraine, unspecified, not intractable, without status migrainosus: Secondary | ICD-10-CM | POA: Diagnosis not present

## 2019-01-05 DIAGNOSIS — F419 Anxiety disorder, unspecified: Secondary | ICD-10-CM | POA: Diagnosis not present

## 2019-01-05 NOTE — Telephone Encounter (Signed)
Patient advised to contact Dr. Micheline Chapman for lab results and scheduled for virtual visit for today as she reports she is still having a headache.

## 2019-01-05 NOTE — Telephone Encounter (Signed)
Copied from [place]. Topic: Quick Communication - Lab Results (Clinic Use ONLY) >> Jan 05, 2019  7:49 AM Maria Smith wrote: Pt calling for her test results. She states they not on mychart.  Pt also has a headache that will not go away.  She called out of work today. Please advise   Lab work was ordered by Dr. Micheline Chapman. She will need to contact his office to request release to mychart. Otherwise they typically flow automatically to mychart after 7 days. Also, if still having HA please schedule a virtual visit with me today.

## 2019-01-05 NOTE — Progress Notes (Signed)
Virtual Visit via Video Note  I connected with Maria Smith on 01/05/19 at 12:20 PM EDT by a video enabled telemedicine application and verified that I am speaking with the correct person using two identifiers.  Location: Patient: home Provider: home   I discussed the limitations of evaluation and management by telemedicine and the availability of in person appointments. The patient expressed understanding and agreed to proceed.  History of Present Illness:  Patient is a 23 yr old female who presents today with chief complaint of headache.  She reports that she has HA for 2 days.  Pain is located above the left eye.    Pt took ibuprofen and early this AM she took an excedrin. Did not fall asleep last night until 2 pm due to HA. Denies nausea or light sensitivity. Notes mild phonophobia. Denies blurred vision.   She notes some aching at the tip of the left index finger. Reports that her anxiety has improved. She is trying natural measures. Notes that she has had soreness in her neck, back and arms. She has not yet tried zoloft. She denies fever.     Past Medical History:  Diagnosis Date  . Allergic rhinitis   . Asthma   . Eczema   . Environmental allergies    Age 20 years  . Fatty liver 12/11/2018   Noted on CT 6/20  . Generalized headaches    Began at age 83 years  . Influenza    Age 49 years  . 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 Streptococcal infection(041.00)    Age 39 and 23 years old  . 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 Marital status: Married    Spouse name: Not on file  . Number of children: Not on file  . Years of education: Not on file  . Highest education level: Not on file  Occupational History  . Not on file  Social Needs  . Financial resource strain: Not on file  . Food insecurity    Worry: Not on file    Inability: Not on file  . Transportation needs     Medical: Not on file    Non-medical: Not on file  Tobacco Use  . Smoking status: Never Smoker  . Smokeless tobacco: Never Used  Substance and Sexual Activity  . Alcohol use: Not Currently  . Drug use: No  . Sexual activity: Yes    Partners: Female    Comment: female partner  Lifestyle  . Physical activity    Days per week: Not on file    Minutes per session: Not on file  . Stress: Not on file  Relationships  . Social Herbalist on phone: Not on file    Gets together: Not on file    Attends religious service: Not on file    Active member of club or organization: Not on file    Attends meetings of clubs or organizations: Not on file    Relationship status: Not on file  . Intimate partner violence    Fear of current or ex partner: Not on file    Emotionally abused: Not on file    Physically abused: Not on file    Forced sexual activity: Not on file  Other Topics Concern  . 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     Past Surgical History:  Procedure Laterality Date  . TONSILLECTOMY  2012  . WISDOM TOOTH EXTRACTION      Family History  Problem Relation Age of Onset  . Migraines Paternal Grandmother   . Migraines Paternal Grandfather   . HIV Father   . Diabetes Mother   . Hypertension Mother   . Anemia Mother   . COPD Mother   . CVA Mother   . Congestive Heart Failure Mother   . Cirrhosis Mother   . Obesity Mother   . Ulcers Brother   . Anemia Sister   . Cholecystitis Brother   . Migraines Paternal Aunt   . Migraines Maternal Uncle        Maternal Great Uncle  . Diabetes Other        Family History  . Hypertension Other        Family History  . Depression Other        Family History  . Asthma Other        Family History  . Allergic  rhinitis Other        Family History  . Asthma Brother     No Known Allergies  Current Outpatient Medications on File Prior to Visit  Medication Sig Dispense Refill  . albuterol (VENTOLIN HFA) 108 (90 Base) MCG/ACT inhaler Inhale 2 puffs into the lungs every 6 (six) hours as needed for wheezing or shortness of breath. 18 g 0  . fluticasone (FLOVENT HFA) 110 MCG/ACT inhaler Inhale 1 puff into the lungs 2 (two) times daily.    . Fluticasone Furoate (ARNUITY ELLIPTA) 100 MCG/ACT AEPB Inhale 1 Dose into the lungs daily. 30 each 5  . hydrOXYzine (ATARAX/VISTARIL) 25 MG tablet Take 1 tablet (25 mg total) by mouth every 6 (six) hours. 12 tablet 0  . medroxyPROGESTERone (PROVERA) 5 MG tablet Take one tablet a day for 5 days every one to two months if no spontaneous cycles. 15 tablet 3  . meloxicam (MOBIC) 7.5 MG tablet Take 1 tablet (7.5 mg total) by mouth daily. 14 tablet 0  . omeprazole (PRILOSEC) 40 MG capsule Take 1 capsule (40 mg total) by mouth daily. 30 capsule 5  . sertraline (ZOLOFT) 50 MG tablet Take 1/2 tablet by mouth once daily for 1 week, then increase to a full tab once daily on week two 30 tablet 0   No current facility-administered medications on file prior to visit.     There were no vitals taken for this visit.      Observations/Objective:   Gen: Awake, alert, no acute distress Resp: Breathing is even and non-labored Psych: calm/pleasant demeanor Neuro: Alert and Oriented x 3, + facial symmetry, speech is clear.   Assessment and Plan:  Migraine- Advised pt on trial of imitrex, one tablet now and then may repeat in 2 hrs x 1 as needed. Pt is advised to call if symptoms worsen or if symptoms fail to improve.   Musculoskeletal pain- had negative autoimmune work up by sports medicine. We discussed that she should schedule an office visit if symptoms worsen or fail to improve.   Anxiety- pt reports that she feels like she is doing better. Will monitor for now.     Follow Up Instructions:  I discussed the assessment and treatment plan with the patient. The patient was provided an opportunity to ask questions and all were answered. The patient agreed with the plan and demonstrated an understanding of the instructions.   The patient was advised to call back or seek an in-person evaluation if the symptoms worsen or if the condition fails to improve as anticipated.  Nance Pear, NP

## 2019-01-06 ENCOUNTER — Telehealth (INDEPENDENT_AMBULATORY_CARE_PROVIDER_SITE_OTHER): Payer: BC Managed Care – PPO | Admitting: Sports Medicine

## 2019-01-06 ENCOUNTER — Ambulatory Visit (HOSPITAL_COMMUNITY)
Admission: EM | Admit: 2019-01-06 | Discharge: 2019-01-06 | Disposition: A | Discharge status: Home / Self Care | Payer: BC Managed Care – PPO | Attending: Family Medicine | Admitting: Family Medicine

## 2019-01-06 ENCOUNTER — Encounter (HOSPITAL_COMMUNITY): Payer: Self-pay | Admitting: Emergency Medicine

## 2019-01-06 ENCOUNTER — Other Ambulatory Visit: Payer: Self-pay

## 2019-01-06 VITALS — Ht <= 58 in | Wt 123.0 lb

## 2019-01-06 DIAGNOSIS — R51 Headache: Secondary | ICD-10-CM | POA: Diagnosis not present

## 2019-01-06 DIAGNOSIS — M791 Myalgia, unspecified site: Secondary | ICD-10-CM

## 2019-01-06 DIAGNOSIS — M5489 Other dorsalgia: Secondary | ICD-10-CM | POA: Diagnosis not present

## 2019-01-06 DIAGNOSIS — R0789 Other chest pain: Secondary | ICD-10-CM

## 2019-01-06 DIAGNOSIS — Z20828 Contact with and (suspected) exposure to other viral communicable diseases: Secondary | ICD-10-CM | POA: Diagnosis not present

## 2019-01-06 MED ORDER — DEXAMETHASONE SODIUM PHOSPHATE 10 MG/ML IJ SOLN
10.0000 mg | Freq: Once | INTRAMUSCULAR | Status: AC
  Administered 2019-01-06: 10 mg via INTRAMUSCULAR

## 2019-01-06 MED ORDER — KETOROLAC TROMETHAMINE 60 MG/2ML IM SOLN
INTRAMUSCULAR | Status: AC
  Filled 2019-01-06: qty 2

## 2019-01-06 MED ORDER — NAPROXEN 500 MG PO TABS: 500.0000 mg | ORAL_TABLET | Freq: Two times a day (BID) | ORAL | 0 refills | Status: DC

## 2019-01-06 MED ORDER — KETOROLAC TROMETHAMINE 60 MG/2ML IM SOLN
60.0000 mg | Freq: Once | INTRAMUSCULAR | Status: AC
  Administered 2019-01-06: 60 mg via INTRAMUSCULAR

## 2019-01-06 MED ORDER — DEXAMETHASONE SODIUM PHOSPHATE 10 MG/ML IJ SOLN
INTRAMUSCULAR | Status: AC
  Filled 2019-01-06: qty 1

## 2019-01-06 MED ORDER — CYCLOBENZAPRINE HCL 5 MG PO TABS: 5.0000 mg | ORAL_TABLET | Freq: Two times a day (BID) | ORAL | 0 refills | Status: DC | PRN

## 2019-01-06 NOTE — Progress Notes (Signed)
[facility] Endocrinology Dr [facility] Tuesday July 28th at [address] [place], [place] [address] [phone]

## 2019-01-06 NOTE — ED Triage Notes (Signed)
Pt has been having chronic chest pain over the last few months.  She spoke with one of her physicians today vs. telehealth and was encouraged to get her appointments with endocrinology.  They called her today and she has an appointment for Monday.  The person she spoke with told her to come to UC today because of her chest pain.  Pt complains of chest, back and limb pain, the same she has had, but states the pain is worse today.

## 2019-01-06 NOTE — Discharge Instructions (Signed)
We gave you toradol and decadron  Use anti-inflammatories for pain/swelling. You may take up to 800 mg Ibuprofen every 8 hours with food. You may supplement Ibuprofen with Tylenol [id number] mg every 8 hours.  OR Naprosyn twice daily with food  You may use flexeril as needed to help with pain. This is a muscle relaxer and causes sedation- please use only at bedtime or when you will be home and not have to drive/work  Follow up with Primary care and endocrinology as planned

## 2019-01-06 NOTE — Progress Notes (Signed)
Patient ID: Maria Smith, female   DOB: 12-18-1995, 23 y.o.   MRN: [id number]  This visit was provided via telemedicine Consent obtained Patient located at home Provider located in the office  I spoke with the patient via telephone today.  She is endorsing diffuse body pain including her head, back, chest, and limbs.  Recent blood work done at our office to rule out autoimmune disease was unremarkable.  We do have a referral in for endocrinology for thyromegaly but the patient is awaiting that appointment.  In speaking with the patient, I explained to her that diffuse body pain like this is not usually the result of an orthopedic issue.  She has had a couple of virtual visits with her primary care provider and I have strongly recommended that she make an in person appointment.  Her symptoms are suspicious for fibromyalgia and she may benefit from a trial of medication but I explained to her that that is not something we treat here. I also strongly encouraged her to see endocrinology and I reassured her that I will follow-up on the referral to make sure an appointment is scheduled.  Total time with the patient on the phone was 10 minutes of median intra-service time

## 2019-01-07 ENCOUNTER — Ambulatory Visit (INDEPENDENT_AMBULATORY_CARE_PROVIDER_SITE_OTHER): Payer: BC Managed Care – PPO | Admitting: Family

## 2019-01-07 DIAGNOSIS — G43909 Migraine, unspecified, not intractable, without status migrainosus: Secondary | ICD-10-CM

## 2019-01-07 DIAGNOSIS — M791 Myalgia, unspecified site: Secondary | ICD-10-CM | POA: Diagnosis not present

## 2019-01-07 DIAGNOSIS — F419 Anxiety disorder, unspecified: Secondary | ICD-10-CM

## 2019-01-07 NOTE — Progress Notes (Signed)
Virtual Visit via Video Note  I connected with Maria Smith on 01/07/19 at  7:00 AM EDT by a video enabled telemedicine application and verified that I am speaking with the correct person using two identifiers.  Location: Patient: home Provider: home   I discussed the limitations of evaluation and management by telemedicine and the availability of in person appointments. The patient expressed understanding and agreed to proceed.  History of Present Illness:  Patient is a 23 yr old female who presents today for follow up.   Migraine- she states that she went to urgent care last night and was given a "shot" (thinks toradol) and some "steroids."  Reports some improvement in her migraine today.  Anxiety- Never started zoloft. Reports that she has been managing her anxiety with natural measures.  States that the urgent care provider "pressed in a lot of different places and I was tender in places I didn't think I was tender."  Reports pain wakes her from sleep.    Past Medical History:  Diagnosis Date  . Allergic rhinitis   . Asthma   . Eczema   . Environmental allergies    Age 88 years  . Fatty liver 12/11/2018   Noted on CT 6/20  . Generalized headaches    Began at age 61 years  . Influenza    Age 54 years  . 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 Streptococcal infection(041.00)    Age 62 and 23 years old  . 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 Marital status: Married    Spouse name: Not on file  . Number of children: Not on file  . Years of education: Not on file  . Highest education level: Not on file  Occupational History  . Not on file  Social Needs  . Financial resource strain: Not on file  . Food insecurity    Worry: Not on file    Inability: Not on file  . Transportation needs    Medical: Not on file    Non-medical: Not on file  Tobacco Use  . Smoking status: Never  Smoker  . Smokeless tobacco: Never Used  Substance and Sexual Activity  . Alcohol use: Not Currently  . Drug use: No  . Sexual activity: Yes    Partners: Female    Comment: female partner  Lifestyle  . Physical activity    Days per week: Not on file    Minutes per session: Not on file  . Stress: Not on file  Relationships  . Social Herbalist on phone: Not on file    Gets together: Not on file    Attends religious service: Not on file    Active member of club or organization: Not on file    Attends meetings of clubs or organizations: Not on file    Relationship status: Not on file  . Intimate partner violence    Fear of current or ex partner: Not on file    Emotionally abused: Not on file    Physically abused: Not on file    Forced sexual activity: Not on file  Other Topics Concern  . 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     Past Surgical History:  Procedure Laterality Date  . TONSILLECTOMY  2012  . WISDOM TOOTH EXTRACTION      Family History  Problem Relation Age of Onset  . Migraines Paternal Grandmother   . Migraines Paternal Grandfather   . HIV Father   . Diabetes Mother   . Hypertension Mother   . Anemia Mother   . COPD Mother   . CVA Mother   . Congestive Heart Failure Mother   . Cirrhosis Mother   . Obesity Mother   . Ulcers Brother   . Anemia Sister   . Cholecystitis Brother   . Migraines Paternal Aunt   . Migraines Maternal Uncle        Maternal Great Uncle  . Diabetes Other        Family History  . Hypertension Other        Family History  . Depression Other        Family History  . Asthma Other        Family History  . Allergic rhinitis Other        Family History  . Asthma Brother     No Known Allergies  Current  Outpatient Medications on File Prior to Visit  Medication Sig Dispense Refill  . albuterol (VENTOLIN HFA) 108 (90 Base) MCG/ACT inhaler Inhale 2 puffs into the lungs every 6 (six) hours as needed for wheezing or shortness of breath. 18 g 0  . cyclobenzaprine (FLEXERIL) 5 MG tablet Take 1-2 tablets (5-10 mg total) by mouth 2 (two) times daily as needed for muscle spasms. 24 tablet 0  . fluticasone (FLOVENT HFA) 110 MCG/ACT inhaler Inhale 1 puff into the lungs 2 (two) times daily.    . Fluticasone Furoate (ARNUITY ELLIPTA) 100 MCG/ACT AEPB Inhale 1 Dose into the lungs daily. 30 each 5  . ibuprofen (ADVIL) 600 MG tablet Take 600 mg by mouth every 6 (six) hours as needed.    . medroxyPROGESTERone (PROVERA) 5 MG tablet Take one tablet a day for 5 days every one to two months if no spontaneous cycles. 15 tablet 3  . naproxen (NAPROSYN) 500 MG tablet Take 1 tablet (500 mg total) by mouth 2 (two) times daily. 30 tablet 0  . omeprazole (PRILOSEC) 40 MG capsule Take 1 capsule (40 mg total) by mouth daily. 30 capsule 5   No current facility-administered medications on file prior to visit.     LMP 01/02/2019 (Exact Date)      Observations/Objective:   Gen: Awake, alert, no acute distress Resp: Breathing is even and non-labored Psych: calm/pleasant demeanor Neuro: Alert and Oriented x 3, + facial symmetry, speech is clear.   Assessment and Plan:  Migraine- improved.  Monitor.  Myalgias- we discussed possibility of fibromyalgia. I advised her to schedule a face to face visit for in office exam.  We discussed limiting her stress. She currently has 2 jobs but plans to hopefully cut back to 1 job after she starts her new job at [employer].   Anxiety- fair control without medication. Monitor.  Follow Up Instructions:    I discussed the assessment and treatment plan with the patient. The patient was provided an opportunity to ask questions and all were answered. The patient agreed with the plan  and demonstrated an understanding of the instructions.   The patient was advised to call back or seek an  in-person evaluation if the symptoms worsen or if the condition fails to improve as anticipated.  Nance Pear, NP

## 2019-01-09 ENCOUNTER — Other Ambulatory Visit: Payer: Self-pay

## 2019-01-09 NOTE — ED Provider Notes (Signed)
[facility]    CSN: [id number] Arrival date & time: 01/06/19  1808      History   Chief Complaint Chief Complaint  Patient presents with  . Generalized Body Aches    HPI Maria Smith is a 23 y.o. female history of asthma, thyromegaly, migraine presenting today for evaluation of body aches and chest pain. Patient has had intermittent migratory chest pain over the past 4-5 months. She has had negative work ups in the past. She recently was seeing ortho/rheum for diffuse body aches/arthalgias which was negative. She plans to follow up with endocrinology for evaluation of thyromegaly. She presents today as she feels her chest pains have worsened recently. Similar in nature to previous. No new injury. No associated cough, SOB. Denies nausea and vomiting. Taking ibuprofen. Feels muscle relaxers worked previously. Denies previous DVT/PE. Denies leg pain/swellling. Denies recent travel or immobilization. Denies tobacco use.   HPI  Past Medical History:  Diagnosis Date  . Allergic rhinitis   . Asthma   . Eczema   . Environmental allergies    Age 9 years  . Fatty liver 12/11/2018   Noted on CT 6/20  . Generalized headaches    Began at age 23 years  . Influenza    Age 7 years  . 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 Streptococcal infection(041.00)    Age 23 and 23 years old  . 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/2018   Diffuse enlargement on US 6/20    Patient Active Problem List   Diagnosis Date Noted  . 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/2018  . Fatty liver 12/11/2018  . Overweight (BMI 25.0-29.9) 11/12/2018  . PCOS (polycystic ovarian syndrome) 11/11/2018  . Seasonal allergies 09/22/2018  . Mild intermittent asthma with (acute) exacerbation 09/01/2018  . 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 Migraine with aura and without status migrainosus, not intractable 09/11/2012  . Variants of migraine, not elsewhere classified, without mention of intractable migraine without mention of  status migrainosus 09/11/2012  . Unspecified constipation 09/11/2012  . Syncope 08/31/2010  . Neck Pain 08/31/2010  . Insomnia 08/31/2010    Past Surgical History:  Procedure Laterality Date  . TONSILLECTOMY  2012  . WISDOM TOOTH EXTRACTION      OB History    Gravida  0   Para  0   Term  0   Preterm  0   AB  0   Living  0     SAB  0   TAB  0   Ectopic  0   Multiple  0   Live Births  0            Home Medications    Prior to Admission medications   Medication Sig Start Date End Date Taking? Authorizing Provider  albuterol (VENTOLIN HFA) 108 (90 Base) MCG/ACT inhaler Inhale 2 puffs into the lungs every 6 (six) hours as needed for wheezing or shortness of breath. 12/20/18  Yes Hall-Potvin, [place], PA-C  fluticasone (FLOVENT HFA) 110 MCG/ACT inhaler Inhale 1 puff into the lungs 2 (two) times daily.   Yes [provider]  Fluticasone Furoate (ARNUITY ELLIPTA) 100 MCG/ACT AEPB Inhale 1 Dose into the lungs daily. 11/25/18  Yes Kozlow, Alvira PhilipsEric J, MD  ibuprofen (ADVIL) 600 MG tablet Take 600 mg by mouth every 6 (six) hours as needed.   Yes [provider]  cyclobenzaprine (FLEXERIL) 5 MG tablet Take 1-2 tablets (5-10 mg total) by mouth 2 (two) times daily as needed for muscle  spasms. 01/06/19   Kilan Banfill C, PA-C  medroxyPROGESTERone (PROVERA) 5 MG tablet Take one tablet a day for 5 days every one to two months if no spontaneous cycles. 12/16/18   Romualdo BolkJertson, Jill Evelyn, MD  naproxen (NAPROSYN) 500 MG tablet Take 1 tablet (500 mg total) by mouth 2 (two) times daily. 01/06/19   Virda Betters C, PA-C  omeprazole (PRILOSEC) 40 MG capsule Take 1 capsule (40 mg total) by mouth daily. 11/25/18   Kozlow, Alvira PhilipsEric J, MD    Family History Family History  Problem Relation Age of Onset  . Migraines Paternal Grandmother   . Migraines Paternal Grandfather   . HIV Father   . Diabetes Mother   . Hypertension Mother   . Anemia Mother   . COPD Mother   . CVA Mother    . Congestive Heart Failure Mother   . Cirrhosis Mother   . Obesity Mother   . Ulcers Brother   . Anemia Sister   . Cholecystitis Brother   . Migraines Paternal Aunt   . Migraines Maternal Uncle        Maternal Great Uncle  . Diabetes Other        Family History  . Hypertension Other        Family History  . Depression Other        Family History  . Asthma Other        Family History  . Allergic rhinitis Other        Family History  . Asthma Brother     Social History Social History   Tobacco Use  . Smoking status: Never Smoker  . Smokeless tobacco: Never Used  Substance Use Topics  . Alcohol use: Not Currently  . Drug use: No     Allergies   Patient has no known allergies.   Review of Systems Review of Systems  Constitutional: Negative for fatigue and fever.  HENT: Negative for congestion, sinus pressure and sore throat.   Eyes: Negative for photophobia, pain and visual disturbance.  Respiratory: Negative for cough and shortness of breath.   Cardiovascular: Positive for chest pain. Negative for leg swelling.  Gastrointestinal: Negative for abdominal pain, nausea and vomiting.  Genitourinary: Negative for decreased urine volume and hematuria.  Musculoskeletal: Positive for arthralgias, back pain and myalgias. Negative for neck pain and neck stiffness.  Neurological: Positive for headaches. Negative for dizziness, syncope, facial asymmetry, speech difficulty, weakness, light-headedness and numbness.     Physical Exam Triage Vital Signs ED Triage Vitals  Enc Vitals Group     BP 01/06/19 1840 110/70     Pulse Rate 01/06/19 1840 64     Resp 01/06/19 1840 16     Temp 01/06/19 1840 98.6 F (37 C)     Temp Source 01/06/19 1840 Oral     SpO2 01/06/19 1840 99 %     Weight --      Height --      Head Circumference --      Peak Flow --      Pain Score 01/06/19 1837 9     Pain Loc --      Pain Edu? --      Excl. in GC? --    No data found.  Updated Vital  Signs BP 110/70 (BP Location: Left Arm)   Pulse 64   Temp 98.6 F (37 C) (Oral)   Resp 16   LMP 01/02/2019 (Exact Date)   SpO2 99%   Visual  Acuity Right Eye Distance:   Left Eye Distance:   Bilateral Distance:    Right Eye Near:   Left Eye Near:    Bilateral Near:     Physical Exam Vitals signs and nursing note reviewed.  Constitutional:      General: She is not in acute distress.    Appearance: She is well-developed.     Comments: No acute distress, well appearing  HENT:     Head: Normocephalic and atraumatic.     Mouth/Throat:     Comments: Oral mucosa pink and moist, no tonsillar enlargement or exudate. Posterior pharynx patent and nonerythematous, no uvula deviation or swelling. Normal phonation.  Eyes:     Conjunctiva/sclera: Conjunctivae normal.  Neck:     Musculoskeletal: Neck supple.  Cardiovascular:     Rate and Rhythm: Normal rate and regular rhythm.     Heart sounds: No murmur.  Pulmonary:     Effort: Pulmonary effort is normal. No respiratory distress.     Breath sounds: Normal breath sounds.     Comments: Breathing comfortably at rest, CTABL, no wheezing, rales or other adventitious sounds auscultated  Anterior chest diffusely tender, most prominent on left Abdominal:     Palpations: Abdomen is soft.     Tenderness: There is no abdominal tenderness.  Musculoskeletal:     Comments: Non tendner throughout spine midline, diffuse tenderness thorughout cervical and thoracic musculature  Full active ROM of all extremities strength 5/5 and equal bilaterally at shoulders, hips and knees.   Skin:    General: Skin is warm and dry.  Neurological:     General: No focal deficit present.     Mental Status: She is alert and oriented to person, place, and time. Mental status is at baseline.     Cranial Nerves: No cranial nerve deficit.      UC Treatments / Results  Labs (all labs ordered are listed, but only abnormal results are displayed) Labs Reviewed -  No data to display  EKG   Radiology No results found.  Procedures Procedures (including critical care time)  Medications Ordered in UC Medications  ketorolac (TORADOL) injection 60 mg (60 mg Intramuscular Given 01/06/19 1926)  dexamethasone (DECADRON) injection 10 mg (10 mg Intramuscular Given 01/06/19 1926)  dexamethasone (DECADRON) 10 MG/ML injection (has no administration in time range)  ketorolac (TORADOL) 60 MG/2ML injection (has no administration in time range)    Initial Impression / Assessment and Plan / UC Course  I have reviewed the triage vital signs and the nursing notes.  Pertinent labs & imaging results that were available during my care of the patient were reviewed by me and considered in my medical decision making (see chart for details).     Body aches and chest pain seem more musculo/skeletal related than cardiac. Repeat EKG normal sinus rhythm and stable. Will treat with Toradol, decadron IM , continued treatment with naprosyn/flexeril at home. Follow up with endocrinology as planned. PCP for further evaluation of possible fibromyalgia/anxiety. Discussed strict return precautions. Patient verbalized understanding and is agreeable with plan.   Final Clinical Impressions(s) / UC Diagnoses   Final diagnoses:  Myalgia  Chest wall pain     Discharge Instructions     We gave you toradol and decadron  Use anti-inflammatories for pain/swelling. You may take up to 800 mg Ibuprofen every 8 hours with food. You may supplement Ibuprofen with Tylenol [id number] mg every 8 hours.  OR Naprosyn twice daily with food  You may use  flexeril as needed to help with pain. This is a muscle relaxer and causes sedation- please use only at bedtime or when you will be home and not have to drive/work  Follow up with Primary care and endocrinology as planned   ED Prescriptions    Medication Sig Dispense Auth. Provider   cyclobenzaprine (FLEXERIL) 5 MG tablet Take 1-2 tablets  (5-10 mg total) by mouth 2 (two) times daily as needed for muscle spasms. 24 tablet Tiara Bartoli C, PA-C   naproxen (NAPROSYN) 500 MG tablet Take 1 tablet (500 mg total) by mouth 2 (two) times daily. 30 tablet Ajahni Nay, [place] C, PA-C     Controlled Substance Prescriptions [place] Controlled Substance Registry consulted? Not Applicable   Janith Lima, [place] 01/09/19 1007

## 2019-01-12 ENCOUNTER — Encounter: Payer: Self-pay | Admitting: Internal Medicine

## 2019-01-12 ENCOUNTER — Ambulatory Visit (INDEPENDENT_AMBULATORY_CARE_PROVIDER_SITE_OTHER): Payer: BC Managed Care – PPO | Admitting: Internal Medicine

## 2019-01-12 ENCOUNTER — Other Ambulatory Visit: Payer: Self-pay

## 2019-01-12 VITALS — BP 104/78 | HR 74 | Temp 98.1°F | Ht <= 58 in | Wt 119.4 lb

## 2019-01-12 DIAGNOSIS — E282 Polycystic ovarian syndrome: Secondary | ICD-10-CM

## 2019-01-12 DIAGNOSIS — E069 Thyroiditis, unspecified: Secondary | ICD-10-CM

## 2019-01-12 NOTE — Progress Notes (Signed)
Name: Maria Smith  MRN/ DOB: [id number], October 01, 1995    Age/ Sex: 23 y.o., female    PCP: Maria Craze'Sullivan, [name], NP   Reason for Endocrinology Evaluation: Thyromegaly     Date of Initial Endocrinology Evaluation: 01/12/2019     HPI: Maria Smith is a 23 y.o. female with a past medical history of Headaches and asthma. The patient presented for initial endocrinology clinic visit on 01/12/2019 for consultative assistance with her Thyromegaly  Pt with noted with thyromegaly during routine examination sometime in 11/2018.  Pt presented to the ED in 10/2018 with chest congestion, since then she has been evaluated multiple times with  Chest pain that has been attributed to costochondritis.    Today her neck swelling is stable, initially she did have pain at the area. She denies any dysphagia, but does feel choked when she lays down.   Has had an intentional weight loss of ~ 11 lbs. She has anxiety and palpitations. Denies constipation and diarrhea  No FH of thyroid disease.    No biotin .   She is on ibuprofen but she doesn't like to take medications so she has been using CBD oil as a natural form.    PCOS:  Has been recently diagnosed with this, she is unable to be on COC pills due to migraines with aura. She has been able to lose 11 lbs with lifestyle changes and had a menses in July.    HISTORY:  Past Medical History:  Past Medical History:  Diagnosis Date  . Allergic rhinitis   . Asthma   . Eczema   . Environmental allergies    Age 67 years  . Fatty liver 12/11/2018   Noted on CT 6/20  . Generalized headaches    Began at age 23 years  . Influenza    Age 15 years  . 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 Streptococcal infection(041.00)    Age [age] years old  . 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/2018   Diffuse enlargement on US 6/20    Past Surgical History:  Past Surgical History:  Procedure Laterality Date  . TONSILLECTOMY  2012   . WISDOM TOOTH EXTRACTION        Social History:  reports that she has never smoked. She has never used smokeless tobacco. She reports previous alcohol use. She reports that she does not use drugs.  Family History: family history includes Allergic rhinitis in an other family member; Anemia in her mother and sister; Asthma in her brother and another family member; COPD in her mother; CVA in her mother; Cholecystitis in her brother; Cirrhosis in her mother; Congestive Heart Failure in her mother; Depression in an other family member; Diabetes in her mother and another family member; HIV in her father; Hypertension in her mother and another family member; Migraines in her maternal uncle, paternal aunt, paternal grandfather, and paternal grandmother; Obesity in her mother; Ulcers in her brother.   HOME MEDICATIONS: Allergies as of 01/12/2019   No Known Allergies     Medication List       Accurate as of January 12, 2019  8:55 AM. If you have any questions, ask your nurse or doctor.        albuterol 108 (90 Base) MCG/ACT inhaler Commonly known as: VENTOLIN HFA Inhale 2 puffs into the lungs every 6 (six) hours as needed for wheezing or shortness of breath.   cyclobenzaprine 5 MG tablet Commonly known  as: FLEXERIL Take 1-2 tablets (5-10 mg total) by mouth 2 (two) times daily as needed for muscle spasms.   fluticasone 110 MCG/ACT inhaler Commonly known as: FLOVENT HFA Inhale 1 puff into the lungs 2 (two) times daily.   Fluticasone Furoate 100 MCG/ACT Aepb Commonly known as: Arnuity Ellipta Inhale 1 Dose into the lungs daily.   ibuprofen 600 MG tablet Commonly known as: ADVIL Take 600 mg by mouth every 6 (six) hours as needed.   medroxyPROGESTERone 5 MG tablet Commonly known as: Provera Take one tablet a day for 5 days every one to two months if no spontaneous cycles.   naproxen 500 MG tablet Commonly known as: NAPROSYN Take 1 tablet (500 mg total) by mouth 2 (two) times daily.    omeprazole 40 MG capsule Commonly known as: PRILOSEC Take 1 capsule (40 mg total) by mouth daily.         REVIEW OF SYSTEMS: A comprehensive ROS was conducted with the patient and is negative except as per HPI and below:  ROS     OBJECTIVE:  VS: BP 104/78 (BP Location: Left Arm, Patient Position: Sitting, Cuff Size: Normal)   Pulse 74   Temp 98.1 F (36.7 Smith)   Ht 4\' 10"  (1.473 m)   Wt 119 lb 6.4 oz (54.2 kg)   LMP 01/02/2019 (Exact Date)   SpO2 99%   BMI 24.95 kg/m    Wt Readings from Last 3 Encounters:  01/12/19 119 lb 6.4 oz (54.2 kg)  01/06/19 123 lb (55.8 kg)  12/30/18 123 lb (55.8 kg)     EXAM: General: Pt appears well and is in NAD  Hydration: Well-hydrated with moist mucous membranes and good skin turgor  Eyes: External eye exam normal without stare, lid lag or exophthalmos.  EOM intact.  PERRL.  Ears, Nose, Throat: Hearing: Grossly intact bilaterally Dental: Good dentition  Throat: Clear without mass, erythema or exudate  Neck: General: Supple without adenopathy. Thyroid: Thyroid size normal.  No goiter or nodules appreciated. No thyroid bruit.  Lungs: Clear with good BS bilat with no rales, rhonchi, or wheezes  Heart: Auscultation: RRR.  Abdomen: Normoactive bowel sounds, soft, nontender, without masses or organomegaly palpable  Extremities: Gait and station: Normal gait  Digits and nails: No clubbing, cyanosis, petechiae, or nodes Head and neck: Normal alignment and mobility BL UE: Normal ROM and strength. BL LE: No pretibial edema normal ROM and strength.  Skin: Hair: Texture and amount normal with gender appropriate distribution Skin Inspection: No rashes Skin Palpation: Skin temperature, texture, and thickness normal to palpation  Neuro: Cranial nerves: II - XII grossly intact  Motor: Normal strength throughout DTRs: 2+ and symmetric in UE without delay in relaxation phase  Mental Status: Judgment, insight: Intact Orientation: Oriented to time,  place, and person Mood and affect: No depression, anxiety, or agitation     DATA REVIEWED: Results for Maria Smith, Maria Smith (MRN [id number]) as of 01/12/2019 08:55  Ref. Range 12/03/2018 05:49  TSH Latest Ref Range: 0.350 - 4.500 uIU/mL 3.261     Thyroid Ultrasound 12/10/18  Enlarged and diffusely heterogeneous thyroid gland most consistent with thyroiditis versus diffuse goitrous change. No discrete thyroid nodules are identified.  ASSESSMENT/PLAN/RECOMMENDATIONS:   1. Thyromegaly :   - Clinically and biochemically euthyroid  - No local neck symptoms.  - On today's exam her thyroid exam is normal, most likely reason for enlargement was viral thyroiditis from 10/2018.  - Reassurance provided. Pt may use NSAID's if needed.  2. PCOS:  - We did discuss the mainstay of PCOS treatment is Combined oral Contraception pills to improve menstruation regularity and reduce testosterone. Pt is unable to take estrogen due to migraine with aura, I have advised her that she has the option of progestin only pills or IUD if interested.  - In the meantime she can continue with lifestyle changes and weight loss. - If she starts having high glucose, may consider metformin.    F/U PRN   Signed electronically by: Mack Guise, MD  [facility]  [facility] [address] [place], [place] [address] Phone: [phone] FAX: [phone]   CC: Debbrah Alar, [address] [place] [address] Phone: [phone] Fax: [phone]   Return to Endocrinology clinic as below: Future Appointments  Date Time Provider [place]  01/20/2019  7:00 AM Debbrah Alar, NP [facility] [place]

## 2019-01-12 NOTE — Patient Instructions (Signed)
-   The most likely reason for your enlarged thyroid is viral infection that started in May, 2020. Based on today's exam your thyroid gland is not enlarged, also, your thyroid ultrasound did not show any nodules which is reassuring.

## 2019-01-13 ENCOUNTER — Other Ambulatory Visit: Payer: Self-pay | Admitting: Family

## 2019-01-13 ENCOUNTER — Ambulatory Visit: Payer: BC Managed Care – PPO | Admitting: Internal Medicine

## 2019-01-16 ENCOUNTER — Encounter: Payer: Self-pay | Admitting: Family

## 2019-01-20 ENCOUNTER — Other Ambulatory Visit: Payer: Self-pay

## 2019-01-20 ENCOUNTER — Ambulatory Visit: Payer: BC Managed Care – PPO | Admitting: Family

## 2019-01-20 ENCOUNTER — Telehealth: Payer: Self-pay | Admitting: Family

## 2019-01-20 ENCOUNTER — Ambulatory Visit (INDEPENDENT_AMBULATORY_CARE_PROVIDER_SITE_OTHER): Payer: BC Managed Care – PPO | Admitting: Family

## 2019-01-20 DIAGNOSIS — J029 Acute pharyngitis, unspecified: Secondary | ICD-10-CM

## 2019-01-20 MED ORDER — FLUCONAZOLE 150 MG PO TABS: 150.0000 mg | ORAL_TABLET | Freq: Once | ORAL | 0 refills | Status: AC

## 2019-01-20 MED ORDER — AMOXICILLIN 500 MG PO CAPS: 500.0000 mg | ORAL_CAPSULE | Freq: Three times a day (TID) | ORAL | 0 refills | Status: DC

## 2019-01-20 NOTE — Telephone Encounter (Signed)
Pt is calling and the amoxicillin gave her an yeast infection  The last. Pt would like to know if provider could call in another abx that may not cause yeast infection. [facility] [address]

## 2019-01-20 NOTE — Progress Notes (Signed)
Virtual Visit via Video Note  I connected with Maria Smith on 01/20/19 at  7:00 AM EDT by a video enabled telemedicine application and verified that I am speaking with the correct person using two identifiers.  Location: Patient: home Provider: office   I discussed the limitations of evaluation and management by telemedicine and the availability of in person appointments. The patient expressed understanding and agreed to proceed.  History of Present Illness:  Patient is a 23 yr old female who presents today with chief complaint of sore throat. She has hx of tonsillectomy. Sore throat has been present for approximately 1 week. She has associated voice hoarseness. Reports pain is worse on the right than it is on the left.  Feels like glands are also swollen on the right.  Has been using ibuprofen/nyquil with minimal improvement. She reports tmax of 99.2.  Past Medical History:  Diagnosis Date  . Allergic rhinitis   . Asthma   . Eczema   . Environmental allergies    Age 28 years  . Fatty liver 12/11/2018   Noted on CT 6/20  . Generalized headaches    Began at age 19 years  . Influenza    Age 61 years  . 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 Streptococcal infection(041.00)    Age [age] years old  . 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 Marital status: Married    Spouse name: Not on file  . Number of children: Not on file  . Years of education: Not on file  . Highest education level: Not on file  Occupational History  . Not on file  Social Needs  . Financial resource strain: Not on file  . Food insecurity    Worry: Not on file    Inability: Not on file  . Transportation needs    Medical: Not on file    Non-medical: Not on file  Tobacco Use  . Smoking status: Never Smoker  . Smokeless tobacco: Never Used  Substance and Sexual Activity  . Alcohol use: Not Currently  . Drug  use: No  . Sexual activity: Yes    Partners: Female    Comment: female partner  Lifestyle  . Physical activity    Days per week: Not on file    Minutes per session: Not on file  . Stress: Not on file  Relationships  . Social Herbalist on phone: Not on file    Gets together: Not on file    Attends religious service: Not on file    Active member of club or organization: Not on file    Attends meetings of clubs or organizations: Not on file    Relationship status: Not on file  . Intimate partner violence    Fear of current or ex partner: Not on file    Emotionally abused: Not on file    Physically abused: Not on file    Forced sexual activity: Not on file  Other Topics Concern  . 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     Past Surgical History:  Procedure Laterality Date  . TONSILLECTOMY  2012  . WISDOM TOOTH EXTRACTION      Family History  Problem Relation Age of Onset  . Migraines Paternal Grandmother   . Migraines Paternal Grandfather   . HIV Father   . Diabetes Mother   . Hypertension Mother   . Anemia Mother   . COPD Mother   . CVA Mother   . Congestive Heart Failure Mother   . Cirrhosis Mother   . Obesity Mother   . Ulcers Brother   . Anemia Sister   . Cholecystitis Brother   . Migraines Paternal Aunt   . Migraines Maternal Uncle        Maternal Great Uncle  . Diabetes Other        Family History  . Hypertension Other        Family History  . Depression Other        Family History  . Asthma Other        Family History  . Allergic rhinitis Other        Family History  . Asthma Brother     No Known Allergies  Current Outpatient Medications on File Prior to Visit  Medication Sig Dispense Refill  . albuterol (VENTOLIN HFA) 108 (90  Base) MCG/ACT inhaler Inhale 2 puffs into the lungs every 6 (six) hours as needed for wheezing or shortness of breath. 18 g 0  . cyclobenzaprine (FLEXERIL) 5 MG tablet Take 1-2 tablets (5-10 mg total) by mouth 2 (two) times daily as needed for muscle spasms. 24 tablet 0  . fluticasone (FLOVENT HFA) 110 MCG/ACT inhaler Inhale 1 puff into the lungs 2 (two) times daily.    . Fluticasone Furoate (ARNUITY ELLIPTA) 100 MCG/ACT AEPB Inhale 1 Dose into the lungs daily. 30 each 5  . ibuprofen (ADVIL) 600 MG tablet Take 600 mg by mouth every 6 (six) hours as needed.    . medroxyPROGESTERone (PROVERA) 5 MG tablet Take one tablet a day for 5 days every one to two months if no spontaneous cycles. 15 tablet 3  . naproxen (NAPROSYN) 500 MG tablet Take 1 tablet (500 mg total) by mouth 2 (two) times daily. 30 tablet 0  . omeprazole (PRILOSEC) 40 MG capsule Take 1 capsule (40 mg total) by mouth daily. 30 capsule 5  . sertraline (ZOLOFT) 50 MG tablet Take 1 tablet (50 mg total) by mouth daily. 30 tablet 0   No current facility-administered medications on file prior to visit.     LMP 01/02/2019 (Exact Date)    Observations/Objective:   Gen: Awake, alert, no acute distress Resp: Breathing is even and non-labored Psych: calm/pleasant demeanor Neuro: Alert and Oriented x 3, + facial symmetry, speech is clear.   Assessment and Plan:  1) pharyngitis- will treat empirically for strep with amoxicillin.  Pt is advised to continue prn ibuprofen and salt water gargles. She is advised to go to the ER if she develops severe throat pain with inability to swallow.  She is advised to contact our office for a face to face visit if symptoms are not improved in 3 days. Pt verbalizes understanding.   Follow Up Instructions:    I discussed the assessment and treatment plan with the patient. The patient was provided an opportunity to ask questions and all were answered. The patient agreed with the plan and demonstrated an  understanding of the instructions.   The patient was advised to call back or seek an in-person evaluation if the  symptoms worsen or if the condition fails to improve as anticipated.  Nance Pear, NP

## 2019-01-20 NOTE — Telephone Encounter (Signed)
Patient advised per Lenna Sciara will keep same rx and add Diflucan 150 mg to prevent yeast infection.

## 2019-01-23 ENCOUNTER — Telehealth: Payer: Self-pay

## 2019-01-23 NOTE — Telephone Encounter (Signed)
Ok per you [place]?

## 2019-01-23 NOTE — Telephone Encounter (Signed)
Copied from [place] [id number]. Topic: Appointment Scheduling - Transfer of Care >> Jan 23, 2019  7:47 AM Rayann Heman wrote: Pt is requesting to transfer FROM: Maria Smith  Pt is requesting to transfer TO: Dr Volanda Napoleon  Reason for requested transfer:location  Send CRM to patient's current PCP (transferring FROM).

## 2019-01-23 NOTE — Telephone Encounter (Signed)
OK with me.

## 2019-01-26 ENCOUNTER — Other Ambulatory Visit: Payer: Self-pay

## 2019-01-27 NOTE — Telephone Encounter (Signed)
Ok with me 

## 2019-01-27 NOTE — Telephone Encounter (Addendum)
Pt checking on appt to switch status. Are you ok with this Dr Volanda Napoleon?

## 2019-01-28 ENCOUNTER — Other Ambulatory Visit: Payer: Self-pay

## 2019-01-28 ENCOUNTER — Ambulatory Visit (INDEPENDENT_AMBULATORY_CARE_PROVIDER_SITE_OTHER): Payer: BC Managed Care – PPO | Admitting: Internal Medicine

## 2019-01-28 ENCOUNTER — Encounter: Payer: Self-pay | Admitting: Internal Medicine

## 2019-01-28 VITALS — BP 104/84 | HR 64 | Temp 98.4°F | Ht <= 58 in | Wt 119.2 lb

## 2019-01-28 DIAGNOSIS — J029 Acute pharyngitis, unspecified: Secondary | ICD-10-CM | POA: Diagnosis not present

## 2019-01-28 DIAGNOSIS — M542 Cervicalgia: Secondary | ICD-10-CM

## 2019-01-28 DIAGNOSIS — R1310 Dysphagia, unspecified: Secondary | ICD-10-CM | POA: Diagnosis not present

## 2019-01-28 DIAGNOSIS — R070 Pain in throat: Secondary | ICD-10-CM | POA: Insufficient documentation

## 2019-01-28 LAB — TSH: TSH: 2.14 u[IU]/mL (ref 0.35–4.50)

## 2019-01-28 LAB — T4, FREE: Free T4: 0.83 ng/dL (ref 0.60–1.60)

## 2019-01-28 NOTE — Progress Notes (Signed)
Name: Maria Smith  MRN/ DOB: [id number], 1995/12/10    Age/ Sex: 23 y.o., female     PCP: Debbrah Alar, NP   Reason for Endocrinology Evaluation: [place]     Initial Endocrinology Clinic Visit: 01/12/2019    PATIENT IDENTIFIER: Maria Smith is a 23 y.o., female with a past medical history of Headaches and asthma . She has followed with [facility] since 01/12/2019 for consultative assistance with management of her Thyromegaly   HISTORICAL SUMMARY:   Pt with noted with thyromegaly during routine examination sometime in 11/2018.  Pt presented to the ED in 10/2018 with chest congestion, since then she has been evaluated multiple times with  Chest pain that has been attributed to costochondritis.     No FH of thyroid disease.    SUBJECTIVE:   During last visit (01/12/2019): TFT's normal. Ultrasound shows no nodules just evidence of thyroiditis.    Today (01/28/2019):  Maria Smith is here for follow up on thyroiditis . She started with  Sore throat associated with cough ~ a week and a half ago, she tested negative for COVID-19 but endorses odynophagia as well as pain at the anterior neck and sides of her neck. She also had a through rash that has resolved since.       ROS:  As per HPI.   HISTORY:  Past Medical History:  Past Medical History:  Diagnosis Date  . Allergic rhinitis   . Asthma   . Eczema   . Environmental allergies    Age 23 years  . Fatty liver 12/11/2018   Noted on CT 6/20  . Generalized headaches    Began at age 48 years  . Influenza    Age 23 years  . 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 Streptococcal infection(041.00)    Age 23 and 23 years old  . 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/2018   Diffuse enlargement on [place] 6/20   Past Surgical History:  Past Surgical History:  Procedure Laterality Date  . TONSILLECTOMY  2012  . WISDOM TOOTH EXTRACTION      Social History:  reports that she  has never smoked. She has never used smokeless tobacco. She reports previous alcohol use. She reports that she does not use drugs. Family History:  Family History  Problem Relation Age of Onset  . Migraines Paternal Grandmother   . Migraines Paternal Grandfather   . HIV Father   . Diabetes Mother   . Hypertension Mother   . Anemia Mother   . COPD Mother   . CVA Mother   . Congestive Heart Failure Mother   . Cirrhosis Mother   . Obesity Mother   . Ulcers Brother   . Anemia Sister   . Cholecystitis Brother   . Migraines Paternal Aunt   . Migraines Maternal Uncle        Maternal Great Uncle  . Diabetes Other        Family History  . Hypertension Other        Family History  . Depression Other        Family History  . Asthma Other        Family History  . Allergic rhinitis Other        Family History  . Asthma Brother      HOME MEDICATIONS: Allergies as of 01/28/2019   No Known Allergies     Medication List  Accurate as of January 28, 2019  8:07 AM. If you have any questions, ask your nurse or doctor.        albuterol 108 (90 Base) MCG/ACT inhaler Commonly known as: VENTOLIN HFA Inhale 2 puffs into the lungs every 6 (six) hours as needed for wheezing or shortness of breath.   amoxicillin 500 MG capsule Commonly known as: AMOXIL Take 1 capsule (500 mg total) by mouth 3 (three) times daily.   cyclobenzaprine 5 MG tablet Commonly known as: FLEXERIL Take 1-2 tablets (5-10 mg total) by mouth 2 (two) times daily as needed for muscle spasms.   fluticasone 110 MCG/ACT inhaler Commonly known as: FLOVENT HFA Inhale 1 puff into the lungs 2 (two) times daily.   Fluticasone Furoate 100 MCG/ACT Aepb Commonly known as: Arnuity Ellipta Inhale 1 Dose into the lungs daily.   ibuprofen 600 MG tablet Commonly known as: ADVIL Take 600 mg by mouth every 6 (six) hours as needed.   medroxyPROGESTERone 5 MG tablet Commonly known as: Provera Take one tablet a day for 5  days every one to two months if no spontaneous cycles.   naproxen 500 MG tablet Commonly known as: NAPROSYN Take 1 tablet (500 mg total) by mouth 2 (two) times daily.   omeprazole 40 MG capsule Commonly known as: PRILOSEC Take 1 capsule (40 mg total) by mouth daily.   sertraline 50 MG tablet Commonly known as: ZOLOFT Take 1 tablet (50 mg total) by mouth daily.         OBJECTIVE:   PHYSICAL EXAM: VS: BP 104/84 (BP Location: Right Arm, Patient Position: Sitting, Cuff Size: Normal)   Pulse 64   Temp 98.4 F (36.9 Smith)   Ht 4' 9.24" (1.454 m)   Wt 119 lb 3.2 oz (54.1 kg)   LMP 01/02/2019 (Exact Date)   SpO2 98%   BMI 25.58 kg/m    EXAM: General: Pt appears well and is in NAD  Ears, Nose, Throat: Hearing: Grossly intact bilaterally Dental: Good dentition  Throat: Clear without mass, erythema or exudate  Neck: General: Supple without adenopathy. Thyroid: Thyroid size normal.  No goiter or nodules appreciated. No thyroid bruit.  Lungs: Clear with good BS bilat with no rales, rhonchi, or wheezes  Heart: Auscultation: RRR.  Abdomen: Normoactive bowel sounds, soft, nontender, without masses or organomegaly palpable  Extremities:  BL UE: Normal ROM and strength. BL LE: No pretibial edema normal ROM and strength.  Skin: Hair: Texture and amount normal with gender appropriate distribution Skin Inspection: No rashes Skin Palpation: Skin temperature, texture, and thickness normal to palpation  Neuro: Cranial nerves: II - XII grossly intact  Motor: Normal strength throughout DTRs: 2+ and symmetric in UE without delay in relaxation phase  Mental Status: Judgment, insight: Intact Orientation: Oriented to time, place, and person Mood and affect: No depression, anxiety, or agitation     DATA REVIEWED: Results for Maria Smith, Maria Smith (MRN [id number]) as of 01/29/2019 07:58  Ref. Range 12/03/2018 05:49 12/03/2018 05:53 01/28/2019 08:00  TSH Latest Ref Range: 0.35 - 4.50 uIU/mL  3.261  2.14  T4,Free(Direct) Latest Ref Range: 0.60 - 1.60 ng/dL   9.560.83      ASSESSMENT / PLAN / RECOMMENDATIONS:   1. Sore throat/Odynophagia:   - Clinically and biochemically euthyroid  - No local neck symptoms/ no tenderness on today's exams.  - We did discuss that these symptoms are viral/infectious of nature and out of the scope of endocrinology, she is concerned that this has been  going on  Since March. She has occasional Heartburn and Smith/o constant drainage in the back of her throat.  - We did discussed differential of odynophagia such as infectious, vs GERD vs candidal esophagitis ( Given recent Abx use ). If these symptoms continue she may benefit from seeing an ENT. - Reassurance provided that her thyroid is normal on today's exam . Pt may use NSAID's if needed.      F/U PRN    Signed electronically by: Lyndle HerrlichAbby Jaralla , MD  [facility]  [facility] [address] [place], [place] [address] Phone: [phone] FAX: [phone]      CC: Sandford CrazeO'Sullivan, [name], NP [address] [place] [address] Phone: [phone]  Fax: [phone]   Return to Endocrinology clinic as below: No future appointments.

## 2019-01-28 NOTE — Patient Instructions (Signed)
-   Sore throat and painful swallowing ( Odynophagia) are not signs of thyroid disease. Please discuss with your primary Care physician as this could be related to acid reflux versus yeast infection of the throat.

## 2019-01-29 LAB — THYROID PEROXIDASE ANTIBODY: Thyroperoxidase Ab SerPl-aCnc: 2 IU/mL (ref <9)

## 2019-01-30 ENCOUNTER — Encounter: Payer: Self-pay | Admitting: Family Medicine

## 2019-01-30 ENCOUNTER — Ambulatory Visit (INDEPENDENT_AMBULATORY_CARE_PROVIDER_SITE_OTHER): Payer: BC Managed Care – PPO | Admitting: Family Medicine

## 2019-01-30 ENCOUNTER — Other Ambulatory Visit: Payer: Self-pay

## 2019-01-30 DIAGNOSIS — J4520 Mild intermittent asthma, uncomplicated: Secondary | ICD-10-CM | POA: Diagnosis not present

## 2019-01-30 DIAGNOSIS — G8929 Other chronic pain: Secondary | ICD-10-CM

## 2019-01-30 DIAGNOSIS — M791 Myalgia, unspecified site: Secondary | ICD-10-CM | POA: Diagnosis not present

## 2019-01-30 DIAGNOSIS — R51 Headache: Secondary | ICD-10-CM | POA: Diagnosis not present

## 2019-01-30 DIAGNOSIS — K219 Gastro-esophageal reflux disease without esophagitis: Secondary | ICD-10-CM

## 2019-01-30 DIAGNOSIS — F419 Anxiety disorder, unspecified: Secondary | ICD-10-CM

## 2019-01-30 DIAGNOSIS — R519 Headache, unspecified: Secondary | ICD-10-CM

## 2019-01-30 NOTE — Progress Notes (Signed)
Virtual Visit via Video Note  I connected with Maria Smith on 01/30/19 at  9:00 AM EDT by a video enabled telemedicine application 2/2 [id number] pandemic and verified that I am speaking with the correct person using two identifiers.  Location patient: home Location provider:work or home office Persons participating in the virtual visit: patient, provider  I discussed the limitations of evaluation and management by telemedicine and the availability of in person appointments. The patient expressed understanding and agreed to proceed.   HPI: Pt is a 23 yo female asthma. Previously seen by Debbrah Alar, NP.  H/o ongoing concerns: -Kept having CP, told costochondritis or anxiety in ED -notes pain happens then anxiety increases -saw sports med -had work up with labs -told thyroid was enlarged.  Had negative u/s.  Saw Endo, told normal. -taking omperazole 40 mg for increased mucus and difficulty swallowing. -HA- L temporal, dizzy spells, dull achy pains in neck/occipital area/side of neck. -pt states felt fine until March 2020.  Moved in with mother in law until house was being built.  In new home since June.  -CBD helped some.  Will try peppermint oil for HAs.  If HAs continue will use ibuprofen.  Anxiety: -pt does want to be on meds.  Has not taken Zoloft 50 mg. -likes to meditate -believes a person can change their thoughts/ behavior without meds.  Does not like labels. -is in therapy, but her insurance plan recently stopped paying for the visits. -mom has a h/o BH issues  Social hx: Pt and her wife recently moved into their new home.  Pt is vegan.  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  Pt denies EtOH, tobacco, or drug use.  Pt stopped drinking in March 2020.  Family Medical hx: Mom-panic attacks, anxiety, depression  ROS: See pertinent positives and negatives per HPI.  Past Medical History:  Diagnosis Date  . Allergic  rhinitis   . Asthma   . Eczema   . Environmental allergies    Age 66 years  . Fatty liver 12/11/2018   Noted on CT 6/20  . Generalized headaches    Began at age 58 years  . Influenza    Age 6 years  . 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 Streptococcal infection(041.00)    Age 47 and 23 years old  . 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 TONSILLECTOMY  2012  . WISDOM TOOTH EXTRACTION      Family History  Problem Relation Age of Onset  . Migraines Paternal Grandmother   . Migraines Paternal Grandfather   . HIV Father   . Diabetes Mother   . Hypertension Mother   . Anemia Mother   . COPD Mother   . CVA Mother   . Congestive Heart Failure Mother   . Cirrhosis Mother   . Obesity Mother   . Ulcers Brother   . Anemia Sister   . Cholecystitis Brother   . Migraines Paternal Aunt   . Migraines Maternal Uncle        Maternal Great Uncle  . Diabetes Other        Family History  . Hypertension Other        Family History  . Depression Other        Family History  . Asthma Other  Family History  . Allergic rhinitis Other        Family History  . Asthma Brother      Current Outpatient Medications:  .  albuterol (VENTOLIN HFA) 108 (90 Base) MCG/ACT inhaler, Inhale 2 puffs into the lungs every 6 (six) hours as needed for wheezing or shortness of breath., Disp: 18 g, Rfl: 0 .  amoxicillin (AMOXIL) 500 MG capsule, Take 1 capsule (500 mg total) by mouth 3 (three) times daily., Disp: 30 capsule, Rfl: 0 .  cyclobenzaprine (FLEXERIL) 5 MG tablet, Take 1-2 tablets (5-10 mg total) by mouth 2 (two) times daily as needed for muscle spasms., Disp: 24 tablet, Rfl: 0 .  fluticasone (FLOVENT HFA) 110 MCG/ACT inhaler, Inhale 1 puff into the lungs 2 (two) times daily., Disp: , Rfl:  .  Fluticasone Furoate (ARNUITY ELLIPTA) 100 MCG/ACT AEPB, Inhale 1 Dose into the lungs daily., Disp: 30 each,  Rfl: 5 .  ibuprofen (ADVIL) 600 MG tablet, Take 600 mg by mouth every 6 (six) hours as needed., Disp: , Rfl:  .  medroxyPROGESTERone (PROVERA) 5 MG tablet, Take one tablet a day for 5 days every one to two months if no spontaneous cycles., Disp: 15 tablet, Rfl: 3 .  naproxen (NAPROSYN) 500 MG tablet, Take 1 tablet (500 mg total) by mouth 2 (two) times daily., Disp: 30 tablet, Rfl: 0 .  omeprazole (PRILOSEC) 40 MG capsule, Take 1 capsule (40 mg total) by mouth daily., Disp: 30 capsule, Rfl: 5 .  sertraline (ZOLOFT) 50 MG tablet, Take 1 tablet (50 mg total) by mouth daily., Disp: 30 tablet, Rfl: 0  EXAM:  VITALS per patient if applicable: RR between 12-20 bpm  GENERAL: alert, oriented, appears well and in no acute distress  HEENT: atraumatic, conjunctiva clear, no obvious abnormalities on inspection of external nose and ears  NECK: normal movements of the head and neck  LUNGS: on inspection no signs of respiratory distress, breathing rate appears normal, no obvious gross SOB, gasping or wheezing  CV: no obvious cyanosis  MS: moves all visible extremities without noticeable abnormality  PSYCH/NEURO: pleasant and cooperative, no obvious depression or anxiety, speech and thought processing grossly intact  ASSESSMENT AND PLAN:  Discussed the following assessment and plan:  Mild intermittent asthma without complication  -continue albuterol inhaler prn  Gastroesophageal reflux disease, esophagitis presence not specified -start omeprazole 40 mg daily -consistency encouraged -pt to keep a food diary -will re-evaluate in 1 month -consider GI referral   Chronic nonintractable headache, unspecified headache type  -pt to keep a HA diary -increase po intake of water -consider Neurology referral.  Myalgia  -likely 2/2 increased stress. -consider fibromyalgia -discussed ways to reduce stress  Anxiety -continue therapy -discussed ways to reduce anxiety  F/u in 1 month   I  discussed the assessment and treatment plan with the patient. The patient was provided an opportunity to ask questions and all were answered. The patient agreed with the plan and demonstrated an understanding of the instructions.   The patient was advised to call back or seek an in-person evaluation if the symptoms worsen or if the condition fails to improve as anticipated.  I provided 25 minutes of non-face-to-face time during this encounter.   Deeann SaintShannon R Banks, MD

## 2019-02-11 ENCOUNTER — Other Ambulatory Visit: Payer: Self-pay | Admitting: Family

## 2019-02-11 NOTE — Telephone Encounter (Signed)
Requesting a 90 day supply.

## 2019-02-11 NOTE — Telephone Encounter (Signed)
Please see refill request. I believe she is now seeing you as PCP. thanks

## 2019-02-12 ENCOUNTER — Telehealth (INDEPENDENT_AMBULATORY_CARE_PROVIDER_SITE_OTHER): Payer: BC Managed Care – PPO | Admitting: Family Medicine

## 2019-02-12 ENCOUNTER — Other Ambulatory Visit: Payer: Self-pay

## 2019-02-12 ENCOUNTER — Encounter (HOSPITAL_COMMUNITY): Payer: Self-pay

## 2019-02-12 ENCOUNTER — Encounter: Payer: Self-pay | Admitting: Family Medicine

## 2019-02-12 ENCOUNTER — Ambulatory Visit (HOSPITAL_COMMUNITY)
Admission: EM | Admit: 2019-02-12 | Discharge: 2019-02-12 | Disposition: A | Discharge status: Home / Self Care | Payer: BC Managed Care – PPO | Attending: Internal Medicine | Admitting: Internal Medicine

## 2019-02-12 DIAGNOSIS — R0989 Other specified symptoms and signs involving the circulatory and respiratory systems: Secondary | ICD-10-CM | POA: Diagnosis not present

## 2019-02-12 DIAGNOSIS — J312 Chronic pharyngitis: Secondary | ICD-10-CM | POA: Diagnosis not present

## 2019-02-12 DIAGNOSIS — R21 Rash and other nonspecific skin eruption: Secondary | ICD-10-CM

## 2019-02-12 DIAGNOSIS — R070 Pain in throat: Secondary | ICD-10-CM | POA: Diagnosis not present

## 2019-02-12 NOTE — ED Triage Notes (Signed)
Pt presents with throat pain after swallowing a pill the wrong way this morning; pt states she also has a itchy rash on her chest she noticed this morning.

## 2019-02-12 NOTE — Progress Notes (Signed)
Virtual Visit via Video Note  I connected with the patient on 02/12/19 at 10:00 AM EDT by a video enabled telemedicine application and verified that I am speaking with the correct person using two identifiers.  Location patient: home Location provider:work or home office Persons participating in the virtual visit: patient, provider  I discussed the limitations of evaluation and management by telemedicine and the availability of in person appointments. The patient expressed understanding and agreed to proceed.   HPI: Here for 2 issues. First she takes a multivitamin every day and yesterday afternoon she took one with some water as usual, but it felt like it stopped part way down on the left side of the back of her throat. Today she still has the sensation that it is stuck in the same spot. No trouble with swallowing or speech otherwise. The other issue is chronic irritation in the back of the throat. This started about March and it has been bothering her off and on ever since then. She never has severe sore throat, but it always bothers her and she says her throat is always red when she looks in the mirror. She takes Omeprazole every day, so she does no think thet GERD is playing a role. She was given a course of Amoxicillin on 01-20-19, but this did not help.    ROS: See pertinent positives and negatives per HPI.  Past Medical History:  Diagnosis Date  . Allergic rhinitis   . Asthma   . Eczema   . Environmental allergies    Age 44 years  . Fatty liver 12/11/2018   Noted on CT 6/20  . Generalized headaches    Began at age 23 years  . Influenza    Age 72 years  . 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 Streptococcal infection(041.00)    Age 23 and 23 years old  . 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/2018   Diffuse enlargement on US 6/20    Past Surgical History:  Procedure Laterality Date  . TONSILLECTOMY  2012  . WISDOM TOOTH EXTRACTION      Family History  Problem Relation Age of  Onset  . Migraines Paternal Grandmother   . Migraines Paternal Grandfather   . HIV Father   . Diabetes Mother   . Hypertension Mother   . Anemia Mother   . COPD Mother   . CVA Mother   . Congestive Heart Failure Mother   . Cirrhosis Mother   . Obesity Mother   . Ulcers Brother   . Anemia Sister   . Cholecystitis Brother   . Migraines Paternal Aunt   . Migraines Maternal Uncle        Maternal Great Uncle  . Diabetes Other        Family History  . Hypertension Other        Family History  . Depression Other        Family History  . Asthma Other        Family History  . Allergic rhinitis Other        Family History  . Asthma Brother      Current Outpatient Medications:  .  albuterol (VENTOLIN HFA) 108 (90 Base) MCG/ACT inhaler, Inhale 2 puffs into the lungs every 6 (six) hours as needed for wheezing or shortness of breath., Disp: 18 g, Rfl: 0 .  amoxicillin (AMOXIL) 500 MG capsule, Take 1 capsule (500 mg total) by mouth 3 (three) times daily., Disp: 30 capsule, Rfl: 0 .  cyclobenzaprine (FLEXERIL) 5 MG tablet, Take 1-2 tablets (5-10 mg total) by mouth 2 (two) times daily as needed for muscle spasms., Disp: 24 tablet, Rfl: 0 .  fluticasone (FLOVENT HFA) 110 MCG/ACT inhaler, Inhale 1 puff into the lungs 2 (two) times daily., Disp: , Rfl:  .  Fluticasone Furoate (ARNUITY ELLIPTA) 100 MCG/ACT AEPB, Inhale 1 Dose into the lungs daily., Disp: 30 each, Rfl: 5 .  ibuprofen (ADVIL) 600 MG tablet, Take 600 mg by mouth every 6 (six) hours as needed., Disp: , Rfl:  .  medroxyPROGESTERone (PROVERA) 5 MG tablet, Take one tablet a day for 5 days every one to two months if no spontaneous cycles., Disp: 15 tablet, Rfl: 3 .  naproxen (NAPROSYN) 500 MG tablet, Take 1 tablet (500 mg total) by mouth 2 (two) times daily., Disp: 30 tablet, Rfl: 0 .  omeprazole (PRILOSEC) 40 MG capsule, Take 1 capsule (40 mg total) by mouth daily., Disp: 30 capsule, Rfl: 5 .  sertraline (ZOLOFT) 50 MG tablet, Take 1  tablet (50 mg total) by mouth daily., Disp: 30 tablet, Rfl: 0  EXAM:  VITALS per patient if applicable:  GENERAL: alert, oriented, appears well and in no acute distress  HEENT: atraumatic, conjunttiva clear, no obvious abnormalities on inspection of external nose and ears  NECK: normal movements of the head and neck  LUNGS: on inspection no signs of respiratory distress, breathing rate appears normal, no obvious gross SOB, gasping or wheezing  CV: no obvious cyanosis  MS: moves all visible extremities without noticeable abnormality  PSYCH/NEURO: pleasant and cooperative, no obvious depression or anxiety, speech and thought processing grossly intact  ASSESSMENT AND PLAN: She has chronic throat irritation and now she likely has a vitamin capsule stuck in a vallecular pocket in the throat. She will drink lots of water. We will refer her to ENT and they will likely perform a pharyngolaryngoscopy to evaluate this.  Alysia Penna, MD  Discussed the following assessment and plan:  No diagnosis found.     I discussed the assessment and treatment plan with the patient. The patient was provided an opportunity to ask questions and all were answered. The patient agreed with the plan and demonstrated an understanding of the instructions.   The patient was advised to call back or seek an in-person evaluation if the symptoms worsen or if the condition fails to improve as anticipated.

## 2019-02-12 NOTE — ED Provider Notes (Signed)
[facility]    CSN: [id number] Arrival date & time: 02/12/19  1939      History   Chief Complaint Chief Complaint  Patient presents with  . Throat Pain  . Rash    Chest    HPI Maria Smith is a 23 y.o. female with history of asthma-controlled and eczema currently controlled comes to the urgent care with complaints of sore throat of 1 day duration.  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  She denies any difficulty swallowing.  No nausea vomiting.  No fever or chills.  No relieving factors for the sore throat.  No ear pain.  Patient also complains of rash on the upper chest which has been stable.  Rash is pruritic and is not spreading.  No change in lotion or bathing soap.  No new prescription medications.   HPI  Past Medical History:  Diagnosis Date  . Allergic rhinitis   . Asthma   . Eczema   . Environmental allergies    Age 41 years  . Fatty liver 12/11/2018   Noted on CT 6/20  . Generalized headaches    Began at age 37 years  . Influenza    Age 6 years  . 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 Streptococcal infection(041.00)    Age [age] years old  . 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/2018   Diffuse enlargement on [place] 6/20    Patient Active Problem List   Diagnosis Date Noted  . Sore throat 01/28/2019  . Odynophagia 01/28/2019  . Neck pain 01/28/2019  . Thyroiditis 01/12/2019  . 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/2018  . Fatty liver 12/11/2018  . Overweight (BMI 25.0-29.9) 11/12/2018  . PCOS (polycystic ovarian syndrome) 11/11/2018  . Seasonal allergies 09/22/2018  . Mild intermittent asthma with (acute) exacerbation 09/01/2018  . 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 Migraine with aura and without status migrainosus, not intractable 09/11/2012  . Variants of migraine, not elsewhere classified, without mention of intractable migraine without mention of status migrainosus 09/11/2012  . Unspecified constipation  09/11/2012  . Syncope 08/31/2010  . Neck Pain 08/31/2010  . Insomnia 08/31/2010    Past Surgical History:  Procedure Laterality Date  . TONSILLECTOMY  2012  . WISDOM TOOTH EXTRACTION      OB History    Gravida  0   Para  0   Term  0   Preterm  0   AB  0   Living  0     SAB  0   TAB  0   Ectopic  0   Multiple  0   Live Births  0            Home Medications    Prior to Admission medications   Medication Sig Start Date End Date Taking? Authorizing Provider  albuterol (VENTOLIN HFA) 108 (90 Base) MCG/ACT inhaler Inhale 2 puffs into the lungs every 6 (six) hours as needed for wheezing or shortness of breath. 12/20/18   Hall-Potvin, [place], PA-C  cyclobenzaprine (FLEXERIL) 5 MG tablet Take 1-2 tablets (5-10 mg total) by mouth 2 (two) times daily as needed for muscle spasms. 01/06/19   Wieters, Hallie C, PA-C  fluticasone (FLOVENT HFA) 110 MCG/ACT inhaler Inhale 1 puff into the lungs 2 (two) times daily.    [provider]  Fluticasone Furoate (ARNUITY ELLIPTA) 100 MCG/ACT AEPB Inhale 1 Dose into the lungs daily. 11/25/18   Kozlow, Minerva Areola  J, MD  ibuprofen (ADVIL) 600 MG tablet Take 600 mg by mouth every 6 (six) hours as needed.    [provider]  medroxyPROGESTERone (PROVERA) 5 MG tablet Take one tablet a day for 5 days every one to two months if no spontaneous cycles. 12/16/18   Salvadore Dom, MD  naproxen (NAPROSYN) 500 MG tablet Take 1 tablet (500 mg total) by mouth 2 (two) times daily. 01/06/19   Wieters, Hallie C, PA-C  omeprazole (PRILOSEC) 40 MG capsule Take 1 capsule (40 mg total) by mouth daily. 11/25/18   Kozlow, Donnamarie Poag, MD  sertraline (ZOLOFT) 50 MG tablet Take 1 tablet (50 mg total) by mouth daily. 01/13/19   Debbrah Alar, NP    Family History Family History  Problem Relation Age of Onset  . Migraines Paternal Grandmother   . Migraines Paternal Grandfather   . HIV Father   . Diabetes Mother   . Hypertension Mother   . Anemia  Mother   . COPD Mother   . CVA Mother   . Congestive Heart Failure Mother   . Cirrhosis Mother   . Obesity Mother   . Ulcers Brother   . Anemia Sister   . Cholecystitis Brother   . Migraines Paternal Aunt   . Migraines Maternal Uncle        Maternal Great Uncle  . Diabetes Other        Family History  . Hypertension Other        Family History  . Depression Other        Family History  . Asthma Other        Family History  . Allergic rhinitis Other        Family History  . Asthma Brother     Social History Social History   Tobacco Use  . Smoking status: Never Smoker  . Smokeless tobacco: Never Used  Substance Use Topics  . Alcohol use: Not Currently  . Drug use: No     Allergies   Patient has no known allergies.   Review of Systems Review of Systems  Constitutional: Negative.   HENT: Positive for sore throat. Negative for ear discharge, ear pain, postnasal drip, rhinorrhea, sinus pressure and sinus pain.   Respiratory: Negative.   Cardiovascular: Negative.   Gastrointestinal: Negative.  Negative for abdominal distention, diarrhea, nausea and vomiting.  Genitourinary: Negative.   Musculoskeletal: Negative for arthralgias, joint swelling and myalgias.  Skin: Positive for rash.     Physical Exam Triage Vital Signs ED Triage Vitals [02/12/19 2011]  Enc Vitals Group     BP 132/81     Pulse Rate 69     Resp 18     Temp 99.4 F (37.4 C)     Temp Source Oral     SpO2 98 %     Weight      Height      Head Circumference      Peak Flow      Pain Score 4     Pain Loc      Pain Edu?      Excl. in [place]?    No data found.  Updated Vital Signs BP 132/81 (BP Location: Right Arm)   Pulse 69   Temp 99.4 F (37.4 C) (Oral)   Resp 18   SpO2 98%   Visual Acuity Right Eye Distance:   Left Eye Distance:   Bilateral Distance:    Right Eye Near:   Left Eye  Near:    Bilateral Near:     Physical Exam Vitals signs and nursing note reviewed.   Constitutional:      Appearance: She is not ill-appearing or toxic-appearing.  HENT:     Right Ear: Tympanic membrane normal.     Left Ear: Tympanic membrane normal.     Mouth/Throat:     Mouth: Mucous membranes are moist.     Pharynx: Oropharynx is clear.  Eyes:     Extraocular Movements: Extraocular movements intact.     Pupils: Pupils are equal, round, and reactive to light.  Pulmonary:     Effort: Pulmonary effort is normal.     Breath sounds: Normal breath sounds.  Abdominal:     General: Bowel sounds are normal.     Palpations: Abdomen is soft.  Musculoskeletal:        General: No swelling, tenderness or deformity.  Skin:    Capillary Refill: Capillary refill takes less than 2 seconds.     Findings: Rash present.     Comments: Erythematous patches over the upper chest.  Not warm to touch  Neurological:     General: No focal deficit present.     Mental Status: She is alert and oriented to person, place, and time.      UC Treatments / Results  Labs (all labs ordered are listed, but only abnormal results are displayed) Labs Reviewed - No data to display  EKG   Radiology No results found.  Procedures Procedures (including critical care time)  Medications Ordered in UC Medications - No data to display  Initial Impression / Assessment and Plan / UC Course  I have reviewed the triage vital signs and the nursing notes.  Pertinent labs & imaging results that were available during my care of the patient were reviewed by me and considered in my medical decision making (see chart for details).     1. Sore throat: Supportive care with warm salt water gargles Tylenol as needed for pain If patient symptoms worsen she is advised to return to urgent care to be reevaluated  2.  Rash: Patient is advised to monitor rash for spread or resolution If the rash worsens then patient is advised to return to urgent care to be reevaluated. Final Clinical Impressions(s) / UC  Diagnoses   Final diagnoses:  Throat pain  Rash   Discharge Instructions   None    ED Prescriptions    None     Controlled Substance Prescriptions [place] Controlled Substance Registry consulted? No   Merrilee JanskyLamptey, Adlynn Lowenstein O, MD 02/16/19 [phone]

## 2019-02-27 ENCOUNTER — Telehealth (INDEPENDENT_AMBULATORY_CARE_PROVIDER_SITE_OTHER): Payer: No Typology Code available for payment source | Admitting: Family Medicine

## 2019-02-27 ENCOUNTER — Other Ambulatory Visit: Payer: Self-pay

## 2019-02-27 DIAGNOSIS — J029 Acute pharyngitis, unspecified: Secondary | ICD-10-CM

## 2019-02-27 DIAGNOSIS — Z789 Other specified health status: Secondary | ICD-10-CM

## 2019-02-27 DIAGNOSIS — I498 Other specified cardiac arrhythmias: Secondary | ICD-10-CM

## 2019-02-27 DIAGNOSIS — M542 Cervicalgia: Secondary | ICD-10-CM | POA: Diagnosis not present

## 2019-02-27 NOTE — Progress Notes (Signed)
Virtual Visit via Video Note  I connected with Maria LoaderInfinity McQueen Smith on 02/27/19 at  8:00 AM EDT by a video enabled telemedicine application 2/2 COVID-19 pandemic and verified that I am speaking with the correct person using two identifiers.  Location patient: home Location provider:work or home office Persons participating in the virtual visit: patient, provider  I discussed the limitations of evaluation and management by telemedicine and the availability of in person appointments. The patient expressed understanding and agreed to proceed.   HPI: Pt with sore throat x several wks.  States symptoms started after a pill got stuck in her throat.  She tried drinking a lot of water but is still having issues.  Pt seen virtually and in UC.  She was also seen by her Endocrinologist who said her thyroid was normal.  Pt with a sharp pain in the L side of her neck. Pain along L sternocliedomastoid muscle.  The back of her throat can be sore at times.   Pt having trouble with swallowing, feels like some solids are getting stuck, no difficulty with fluids.  Pt endorses HAs, temp elevation (99), L shoulder pain, L neck pain, increased mucus production.   Throat hurts more with talking.  Pt is vegan. Pt s/p tonsillectomy with complicated surgery in High School per pt. Pt has appt with ENT on 9/16.  Pt also notes heart flutters.  States has been happening for years, but has noticed they are more frequent.  Not drinking any caffeine.    Pt under some stress.  Her mom is in the hospital.  Pt's mom has CHF, a "mass in her throat", and h/o stroke.  ROS: See pertinent positives and negatives per HPI.  Past Medical History:  Diagnosis Date  . Allergic rhinitis   . Asthma   . Eczema   . Environmental allergies    Age 28 years  . Fatty liver 12/11/2018   Noted on CT 6/20  . Generalized headaches    Began at age 23 years  . Influenza    Age 29 years  . 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  Streptococcal infection(041.00)    Age 23 and 23 years old  . 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/2018   Diffuse enlargement on US 6/20    Past Surgical History:  Procedure Laterality Date  . TONSILLECTOMY  2012  . WISDOM TOOTH EXTRACTION      Family History  Problem Relation Age of Onset  . Migraines Paternal Grandmother   . Migraines Paternal Grandfather   . HIV Father   . Diabetes Mother   . Hypertension Mother   . Anemia Mother   . COPD Mother   . CVA Mother   . Congestive Heart Failure Mother   . Cirrhosis Mother   . Obesity Mother   . Ulcers Brother   . Anemia Sister   . Cholecystitis Brother   . Migraines Paternal Aunt   . Migraines Maternal Uncle        Maternal Great Uncle  . Diabetes Other        Family History  . Hypertension Other        Family History  . Depression Other        Family History  . Asthma Other        Family History  . Allergic rhinitis Other        Family History  . Asthma Brother      Current Outpatient Medications:  .  albuterol (VENTOLIN HFA) 108 (90 Base) MCG/ACT inhaler, Inhale 2 puffs into the lungs every 6 (six) hours as needed for wheezing or shortness of breath., Disp: 18 g, Rfl: 0 .  cyclobenzaprine (FLEXERIL) 5 MG tablet, Take 1-2 tablets (5-10 mg total) by mouth 2 (two) times daily as needed for muscle spasms., Disp: 24 tablet, Rfl: 0 .  fluticasone (FLOVENT HFA) 110 MCG/ACT inhaler, Inhale 1 puff into the lungs 2 (two) times daily., Disp: , Rfl:  .  Fluticasone Furoate (ARNUITY ELLIPTA) 100 MCG/ACT AEPB, Inhale 1 Dose into the lungs daily., Disp: 30 each, Rfl: 5 .  ibuprofen (ADVIL) 600 MG tablet, Take 600 mg by mouth every 6 (six) hours as needed., Disp: , Rfl:  .  medroxyPROGESTERone (PROVERA) 5 MG tablet, Take one tablet a day for 5 days every one to two months if no spontaneous cycles., Disp: 15 tablet, Rfl: 3 .  naproxen (NAPROSYN) 500 MG tablet, Take 1 tablet (500 mg total) by mouth 2 (two) times daily., Disp: 30 tablet, Rfl: 0 .   omeprazole (PRILOSEC) 40 MG capsule, Take 1 capsule (40 mg total) by mouth daily., Disp: 30 capsule, Rfl: 5 .  sertraline (ZOLOFT) 50 MG tablet, Take 1 tablet (50 mg total) by mouth daily., Disp: 30 tablet, Rfl: 0  EXAM:  VITALS per patient if applicable: pO2 32%  P 86  GENERAL: alert, oriented, appears well and in no acute distress  HEENT: atraumatic, conjunctiva clear, no obvious abnormalities on inspection of external nose and ears  NECK: normal movements of the head and neck  LUNGS: on inspection no signs of respiratory distress, breathing rate appears normal, no obvious gross SOB, gasping or wheezing  CV: no obvious cyanosis  MS: moves all visible extremities without noticeable abnormality  PSYCH/NEURO: pleasant and cooperative, no obvious depression or anxiety, speech and thought processing grossly intact  ASSESSMENT AND PLAN:  Discussed the following assessment and plan:  Neck pain -discussed possible causes including tension/muscle strain, infection -pt encouraged to keep appt with ENT -Plan: CBC with Differential/Platelet  Sore throat  -based on hx, would expect vitamin capsule to have dissolved by now.  Less likely bacterial as completed abx course. --keep f/u with ENT -continue omeprazole - Plan: CBC with Differential/Platelet  Periodic heart flutter  -discussed limiting caffeine intake - Plan: Ambulatory referral to Cardiology, CBC with Differential/Platelet, BMP  Vegan  - Plan: Vitamin B12  F/u prn in the next few wks   I discussed the assessment and treatment plan with the patient. The patient was provided an opportunity to ask questions and all were answered. The patient agreed with the plan and demonstrated an understanding of the instructions.   The patient was advised to call back or seek an in-person evaluation if the symptoms worsen or if the condition fails to improve as anticipated.   Billie Ruddy, MD

## 2019-03-05 ENCOUNTER — Other Ambulatory Visit (INDEPENDENT_AMBULATORY_CARE_PROVIDER_SITE_OTHER): Payer: BC Managed Care – PPO

## 2019-03-05 ENCOUNTER — Other Ambulatory Visit: Payer: Self-pay

## 2019-03-05 DIAGNOSIS — Z789 Other specified health status: Secondary | ICD-10-CM

## 2019-03-05 DIAGNOSIS — J029 Acute pharyngitis, unspecified: Secondary | ICD-10-CM

## 2019-03-05 DIAGNOSIS — I498 Other specified cardiac arrhythmias: Secondary | ICD-10-CM | POA: Diagnosis not present

## 2019-03-05 LAB — BASIC METABOLIC PANEL
BUN: 8 mg/dL (ref 6–23)
CO2: 29 mEq/L (ref 19–32)
Calcium: 10.1 mg/dL (ref 8.4–10.5)
Chloride: 103 mEq/L (ref 96–112)
Creatinine, Ser: 0.8 mg/dL (ref 0.40–1.20)
GFR: 107.01 mL/min (ref >60.00)
Glucose, Bld: 82 mg/dL (ref 70–99)
Potassium: 4.2 mEq/L (ref 3.5–5.1)
Sodium: 140 mEq/L (ref 135–145)

## 2019-03-05 LAB — CBC WITH DIFFERENTIAL/PLATELET
Basophils Absolute: 0 10*3/uL (ref 0.0–0.1)
Basophils Relative: 0.5 % (ref 0.0–3.0)
Eosinophils Absolute: 0.1 10*3/uL (ref 0.0–0.7)
Eosinophils Relative: 3.3 % (ref 0.0–5.0)
HCT: 39.9 % (ref 36.0–46.0)
Hemoglobin: 13.1 g/dL (ref 12.0–15.0)
Lymphocytes Relative: 32.5 % (ref 12.0–46.0)
Lymphs Abs: 1.4 10*3/uL (ref 0.7–4.0)
MCHC: 32.8 g/dL (ref 30.0–36.0)
MCV: 87.9 fl (ref 78.0–100.0)
Monocytes Absolute: 0.2 10*3/uL (ref 0.1–1.0)
Monocytes Relative: 5.6 % (ref 3.0–12.0)
Neutro Abs: 2.5 10*3/uL (ref 1.4–7.7)
Neutrophils Relative %: 58.1 % (ref 43.0–77.0)
Platelets: 300 10*3/uL (ref 150.0–400.0)
RBC: 4.53 Mil/uL (ref 3.87–5.11)
RDW: 13.4 % (ref 11.5–15.5)
WBC: 4.2 10*3/uL (ref 4.0–10.5)

## 2019-03-05 LAB — VITAMIN B12: Vitamin B-12: 715 pg/mL (ref 211–911)

## 2019-03-17 ENCOUNTER — Encounter: Payer: Self-pay | Admitting: Family Medicine

## 2019-03-19 ENCOUNTER — Other Ambulatory Visit: Payer: Self-pay

## 2019-03-19 DIAGNOSIS — J4530 Mild persistent asthma, uncomplicated: Secondary | ICD-10-CM

## 2019-03-19 MED ORDER — FLUTICASONE PROPIONATE HFA 110 MCG/ACT IN AERO: 1.0000 | INHALATION_SPRAY | Freq: Two times a day (BID) | RESPIRATORY_TRACT | 1 refills | Status: DC

## 2019-03-19 MED ORDER — ALBUTEROL SULFATE HFA 108 (90 BASE) MCG/ACT IN AERS: 2.0000 | INHALATION_SPRAY | Freq: Four times a day (QID) | RESPIRATORY_TRACT | 1 refills | Status: DC | PRN

## 2019-03-23 ENCOUNTER — Telehealth (INDEPENDENT_AMBULATORY_CARE_PROVIDER_SITE_OTHER): Payer: No Typology Code available for payment source | Admitting: Family Medicine

## 2019-03-23 ENCOUNTER — Other Ambulatory Visit: Payer: Self-pay

## 2019-03-23 DIAGNOSIS — J4521 Mild intermittent asthma with (acute) exacerbation: Secondary | ICD-10-CM | POA: Diagnosis not present

## 2019-03-23 DIAGNOSIS — R0982 Postnasal drip: Secondary | ICD-10-CM

## 2019-03-23 DIAGNOSIS — J302 Other seasonal allergic rhinitis: Secondary | ICD-10-CM | POA: Diagnosis not present

## 2019-03-23 MED ORDER — LEVOCETIRIZINE DIHYDROCHLORIDE 5 MG PO TABS: 5.0000 mg | ORAL_TABLET | Freq: Every evening | ORAL | 0 refills | Status: DC

## 2019-03-23 MED ORDER — PREDNISONE 20 MG PO TABS: 20.0000 mg | ORAL_TABLET | Freq: Every day | ORAL | 0 refills | Status: AC

## 2019-03-23 NOTE — Progress Notes (Signed)
Virtual Visit via Video Note  I connected with Maria Smith on 03/23/19 at  2:00 PM EDT by a video enabled telemedicine application 2/2 COVID-19 pandemic and verified that I am speaking with the correct person using two identifiers.  Location patient: home Location provider:work or home office Persons participating in the virtual visit: patient, provider  I discussed the limitations of evaluation and management by telemedicine and the availability of in person appointments. The patient expressed understanding and agreed to proceed.   HPI: Pt with increased asthma symptoms of wheezing, SOB, cough, increased phlegm.  Pt using albuterol inhaler prn and flovent inhaler.  Pt had COVID testing today and was sent home from work 2/2 her symptoms.  Symptoms seem to be worse in the evening and at night.  Pt just got a puppy (pit/cattle dog mix) on Friday, her symptoms started Saturday evening.  Pt states in the past she had negative allergy testing for dogs.  Pt denies fever, chills, rhinorrhea, HA, n/v, ear pain/pressure, sore throat, facial pain/pressure.  Pt has a h/o seasonal allergies.  Not currently on allergy medicine.  Of note, prednisone at times causes pt tachycardia.   ROS: See pertinent positives and negatives per HPI.  Past Medical History:  Diagnosis Date  . Allergic rhinitis   . Asthma   . Eczema   . Environmental allergies    Age 23 years  . Fatty liver 12/11/2018   Noted on CT 6/20  . Generalized headaches    Began at age 51 years  . Influenza    Age 3 years  . 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 Streptococcal infection(041.00)    Age 61 and 23 years old  . 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 TONSILLECTOMY  2012  . WISDOM TOOTH EXTRACTION      Family History  Problem Relation Age of Onset  . Migraines Paternal Grandmother   . Migraines Paternal Grandfather    . HIV Father   . Diabetes Mother   . Hypertension Mother   . Anemia Mother   . COPD Mother   . CVA Mother   . Congestive Heart Failure Mother   . Cirrhosis Mother   . Obesity Mother   . Ulcers Brother   . Anemia Sister   . Cholecystitis Brother   . Migraines Paternal Aunt   . Migraines Maternal Uncle        Maternal Great Uncle  . Diabetes Other        Family History  . Hypertension Other        Family History  . Depression Other        Family History  . Asthma Other        Family History  . Allergic rhinitis Other        Family History  . Asthma Brother       Current Outpatient Medications:  .  albuterol (VENTOLIN HFA) 108 (90 Base) MCG/ACT inhaler, Inhale 2 puffs into the lungs every 6 (six) hours as needed for wheezing or shortness of breath., Disp: 18 g, Rfl: 1 .  cyclobenzaprine (FLEXERIL) 5 MG tablet, Take 1-2 tablets (5-10 mg total) by mouth 2 (two) times daily as needed for muscle spasms., Disp: 24 tablet, Rfl: 0 .  fluticasone (FLOVENT HFA) 110 MCG/ACT inhaler, Inhale 1 puff into the lungs 2 (two) times daily., Disp: 1 Inhaler, Rfl:  1 .  Fluticasone Furoate (ARNUITY ELLIPTA) 100 MCG/ACT AEPB, Inhale 1 Dose into the lungs daily., Disp: 30 each, Rfl: 5 .  ibuprofen (ADVIL) 600 MG tablet, Take 600 mg by mouth every 6 (six) hours as needed., Disp: , Rfl:  .  medroxyPROGESTERone (PROVERA) 5 MG tablet, Take one tablet a day for 5 days every one to two months if no spontaneous cycles., Disp: 15 tablet, Rfl: 3 .  naproxen (NAPROSYN) 500 MG tablet, Take 1 tablet (500 mg total) by mouth 2 (two) times daily., Disp: 30 tablet, Rfl: 0 .  omeprazole (PRILOSEC) 40 MG capsule, Take 1 capsule (40 mg total) by mouth daily., Disp: 30 capsule, Rfl: 5 .  sertraline (ZOLOFT) 50 MG tablet, Take 1 tablet (50 mg total) by mouth daily., Disp: 30 tablet, Rfl: 0  EXAM:  VITALS per patient if applicable:  GENERAL: alert, oriented, appears well and in no acute distress  HEENT: atraumatic,  conjunctiva clear, no obvious abnormalities on inspection of external nose and ears  NECK: normal movements of the head and neck  LUNGS: Dry cough.  On inspection no signs of respiratory distress, breathing rate appears normal, no obvious gross SOB, gasping or wheezing  CV: no obvious cyanosis  MS: moves all visible extremities without noticeable abnormality  PSYCH/NEURO: pleasant and cooperative, no obvious depression or anxiety, speech and thought processing grossly intact  ASSESSMENT AND PLAN:  Discussed the following assessment and plan:  Mild intermittent asthma with (acute) exacerbation  -discussed possible causes including change in weather, seasonal allergies, or allergy to pt's new puppy. -discussed 3-day steroid burst for worsening symptoms.  Discussed lower dose of prednisone (20 mg) in an effort to reduce tachycardia.  Pt agrees to plan -continue albuterol inhaler and flovent inhaler - Plan: predniSONE (DELTASONE) 20 MG tablet -given precautions  Post-nasal drainage  -discussed ways to better control allergy symptoms - Plan: levocetirizine (XYZAL) 5 MG tablet  Seasonal allergies -will start Xyzal  F/u prn in the next few days   I discussed the assessment and treatment plan with the patient. The patient was provided an opportunity to ask questions and all were answered. The patient agreed with the plan and demonstrated an understanding of the instructions.   The patient was advised to call back or seek an in-person evaluation if the symptoms worsen or if the condition fails to improve as anticipated.   Billie Ruddy, MD

## 2019-03-26 ENCOUNTER — Encounter: Payer: Self-pay | Admitting: Cardiology

## 2019-03-26 ENCOUNTER — Other Ambulatory Visit: Payer: Self-pay

## 2019-03-26 ENCOUNTER — Ambulatory Visit (INDEPENDENT_AMBULATORY_CARE_PROVIDER_SITE_OTHER): Payer: No Typology Code available for payment source | Admitting: Cardiology

## 2019-03-26 VITALS — BP 100/70 | HR 63 | Temp 97.4°F | Ht <= 58 in | Wt 116.1 lb

## 2019-03-26 DIAGNOSIS — R002 Palpitations: Secondary | ICD-10-CM | POA: Diagnosis not present

## 2019-03-26 DIAGNOSIS — R079 Chest pain, unspecified: Secondary | ICD-10-CM | POA: Diagnosis not present

## 2019-03-26 NOTE — Progress Notes (Signed)
Cardiology Office Note:    Date:  03/26/2019   ID:  Maria Smith, DOB 03-01-1996, MRN [id number]  PCP:  Deeann Saint, MD  Cardiologist:  No primary care provider on file.  Electrophysiologist:  None   Referring MD: Deeann Saint, MD   Chief Complaint  Patient presents with  . Palpitations    History of Present Illness:    Maria Smith is a 23 y.o. female with a hx of asthma,allergies, GERD who presents for an evaluation for palpitations.  Patient reports palpitations have been occurring for last 1 to 2 months.  States that typically occurs at night and has been keeping her awake.  She reports when she lies down she will have palpitations where it feels like her heart is skipping beats.  Occurs most nights.  Does not feel like heart is racing.  Usually lasts a few seconds and resolves, but will occur throughout the night.  She has checked her heart rate during these episodes and states it is usually low, can be as low as the 40s.  Also reports a history of chronic chest pain.  States that it occurs about twice per month.  Describes pain on left side of chest, that she describes as sharp pain.  Occurs at rest.  She exercises with dance and has not noted chest pain while exercising, though has had these pains afterwards.  States that chest pain lasts a few seconds then resolves.  No smoking history.  Reports mother has CAD and heart failure, had stents in her late 27s.    Past Medical History:  Diagnosis Date  . Allergic rhinitis   . Asthma   . Eczema   . Environmental allergies    Age 89 years  . Fatty liver 12/11/2018   Noted on CT 6/20  . Generalized headaches    Began at age 58 years  . Influenza    Age 89 years  . 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 Streptococcal infection(041.00)    Age 40 and 23 years old  . 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  TONSILLECTOMY  2012  . WISDOM TOOTH EXTRACTION      Current Medications: Current Meds  Medication Sig  . albuterol (VENTOLIN HFA) 108 (90 Base) MCG/ACT inhaler Inhale 2 puffs into the lungs every 6 (six) hours as needed for wheezing or shortness of breath.  . cyclobenzaprine (FLEXERIL) 5 MG tablet Take 1-2 tablets (5-10 mg total) by mouth 2 (two) times daily as needed for muscle spasms.  . fluticasone (FLOVENT HFA) 110 MCG/ACT inhaler Inhale 1 puff into the lungs 2 (two) times daily.  . Fluticasone Furoate (ARNUITY ELLIPTA) 100 MCG/ACT AEPB Inhale 1 Dose into the lungs daily.  Marland Kitchen ibuprofen (ADVIL) 600 MG tablet Take 600 mg by mouth every 6 (six) hours as needed.  Marland Kitchen levocetirizine (XYZAL) 5 MG tablet Take 1 tablet (5 mg total) by mouth every evening.  Marland Kitchen omeprazole (PRILOSEC) 40 MG capsule Take 1 capsule (40 mg total) by mouth daily.  . predniSONE (DELTASONE) 20 MG tablet Take 1 tablet (20 mg total) by mouth daily with breakfast for 3 days.     Allergies:   Patient has no known allergies.   Social History   Socioeconomic History  . Marital status: Married    Spouse name: Not on file  . Number of children: Not  on file  . Years of education: Not on file  . Highest education level: Not on file  Occupational History  . Not on file  Social Needs  . Financial resource strain: Not on file  . Food insecurity    Worry: Not on file    Inability: Not on file  . Transportation needs    Medical: Not on file    Non-medical: Not on file  Tobacco Use  . Smoking status: Never Smoker  . Smokeless tobacco: Never Used  Substance and Sexual Activity  . Alcohol use: Not Currently  . Drug use: No  . Sexual activity: Yes    Partners: Female    Comment: female partner  Lifestyle  . Physical activity    Days per week: Not on file    Minutes per session: Not on file  . Stress: Not on file  Relationships  . Social Herbalist on phone: Not on file    Gets together: Not on file     Attends religious service: Not on file    Active member of club or organization: Not on file    Attends meetings of clubs or organizations: Not on file    Relationship status: Not on file  Other Topics Concern  . 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      Family History: The patient's family history includes Allergic rhinitis in an other family member; Anemia in her mother and sister; Asthma in her brother and another family member; COPD in her mother; CVA in her mother; Cholecystitis in her brother; Cirrhosis in her mother; Congestive Heart Failure in her mother; Depression in an other family member; Diabetes in her mother and another family member; HIV in her father; Hypertension in her mother and another family member; Migraines in her maternal uncle, paternal aunt, paternal grandfather, and paternal grandmother; Obesity in her mother; Ulcers in her brother.  ROS:   Please see the history of present illness.     All other systems reviewed and are negative.  EKGs/Labs/Other Studies Reviewed:    The following studies were reviewed today:   EKG:  EKG is ordered today.  The ekg ordered today demonstrates normal sinus rhythm, rate 63, No ST/T abnormalities  Recent Labs: 11/10/2018: ALT 22 01/28/2019: TSH 2.14 03/05/2019: BUN 8; Creatinine, Ser 0.80; Hemoglobin 13.1; Platelets 300.0; Potassium 4.2; Sodium 140  Recent Lipid Panel    Component Value Date/Time   CHOL 167 06/26/2017 1155   TRIG 32 06/26/2017 1155   HDL 94 06/26/2017 1155   CHOLHDL 1.8 06/26/2017 1155   LDLCALC 67 06/26/2017 1155    Physical Exam:    VS:  BP 100/70   Pulse 63   Temp (!) 97.4 F (36.3 C)   Ht 4\' 10"  (1.473 m)   Wt 116 lb 2 oz (52.7 kg)   BMI 24.27 kg/m     Wt Readings from Last 3  Encounters:  03/26/19 116 lb 2 oz (52.7 kg)  01/28/19 119 lb 3.2 oz (54.1 kg)  01/12/19 119 lb 6.4 oz (54.2 kg)     GEN:  Well nourished, well developed in no acute distress HEENT: Normal NECK: No JVD LYMPHATICS: No lymphadenopathy CARDIAC: RRR,  no murmurs, rubs, gallops RESPIRATORY:  Clear to auscultation without rales, wheezing or rhonchi  ABDOMEN: Soft, non-tender, non-distended MUSCULOSKELETAL:  No edema; No deformity  SKIN: Warm and dry NEUROLOGIC:  Alert and oriented x 3 PSYCHIATRIC:  Normal affect   ASSESSMENT:    1. Palpitations   2. Chest pain, unspecified type    PLAN:    In order of problems listed above:  Palpitations: Suspect likely PACs or PVCs given description.  Symptomatic during episodes, has been keeping her awake at night  -Zio patch x3 days  Chest pain: Description consistent with noncardiac chest pain, as describes sharp left-sided pain occurring at rest and lasting few seconds.  No further cardiac work-up at this time  Medication Adjustments/Labs and Tests Ordered: Current medicines are reviewed at length with the patient today.  Concerns regarding medicines are outlined above.  Orders Placed This Encounter  Procedures  . LONG TERM MONITOR (3-14 DAYS)   No orders of the defined types were placed in this encounter.   Patient Instructions  Medication Instructions:  Continue same medications   Lab work: Non ordered   Testing/Procedures: 3 Day Zio Monitor  Follow-Up: At [facility], you and your health needs are our priority.  As part of our continuing mission to provide you with exceptional heart care, we have created designated Provider Care Teams.  These Care Teams include your primary Cardiologist (physician) and Advanced Practice Providers (APPs -  Physician Assistants and Nurse Practitioners) who all work together to provide you with the care you need, when you need it. . Schedule follow up appointment after monitor     Signed,  Little Ishikawahristopher L Roddie Riegler, MD  03/26/2019 8:50 AM    [facility]

## 2019-03-26 NOTE — Patient Instructions (Signed)
Medication Instructions:  Continue same medications   Lab work: Non ordered   Testing/Procedures: 3 Day Zio Monitor  Follow-Up: At [facility], you and your health needs are our priority.  As part of our continuing mission to provide you with exceptional heart care, we have created designated Provider Care Teams.  These Care Teams include your primary Cardiologist (physician) and Advanced Practice Providers (APPs -  Physician Assistants and Nurse Practitioners) who all work together to provide you with the care you need, when you need it. . Schedule follow up appointment after monitor

## 2019-03-31 ENCOUNTER — Other Ambulatory Visit: Payer: Self-pay

## 2019-03-31 ENCOUNTER — Ambulatory Visit: Payer: Self-pay | Admitting: *Deleted

## 2019-03-31 ENCOUNTER — Telehealth: Payer: Self-pay | Admitting: *Deleted

## 2019-03-31 ENCOUNTER — Telehealth (INDEPENDENT_AMBULATORY_CARE_PROVIDER_SITE_OTHER): Payer: No Typology Code available for payment source | Admitting: Family Medicine

## 2019-03-31 DIAGNOSIS — G4700 Insomnia, unspecified: Secondary | ICD-10-CM

## 2019-03-31 DIAGNOSIS — G8929 Other chronic pain: Secondary | ICD-10-CM | POA: Diagnosis not present

## 2019-03-31 DIAGNOSIS — R519 Headache, unspecified: Secondary | ICD-10-CM

## 2019-03-31 MED ORDER — BUTALBITAL-APAP-CAFFEINE 50-325-40 MG PO TABS: 1.0000 | ORAL_TABLET | Freq: Three times a day (TID) | ORAL | 0 refills | Status: DC | PRN

## 2019-03-31 NOTE — Telephone Encounter (Signed)
Virtual visit scheduled with PCP.  

## 2019-03-31 NOTE — Progress Notes (Signed)
Virtual Visit via Video Note  I connected with Maria Smith on 03/31/19 at  1:00 PM EDT by a video enabled telemedicine application 2/2 [id number] pandemic and verified that I am speaking with the correct person using two identifiers.  Location patient: home Location provider:work or home office Persons participating in the virtual visit: patient, provider  I discussed the limitations of evaluation and management by telemedicine and the availability of in person appointments. The patient expressed understanding and agreed to proceed.   HPI: Pt with a h/o migraines.  States has been having a constant HA x the last 4 days.  Pain in on the L side of her head.  Tried excedrin but can trigger her asthma.  HA happen more at night.  Feels like tension or a tightness in her head.  Drinking maybe 3 bottles of water per day, not getting much sleep.  Tried melatonin.  Pt states this feels different from her migraines.  Seen by Pediatric Neurologist, was on topamx, imitrex, and amitriptyline in the past.  Ibuprofen does not typically work for her.  Pt states her HAs in high school were "awful", she had aura, emesis.  Initially pt denies stress, but states her mom is in the hospital in critical condition.  Pt also endorses staring at her phone for long periods of time.  Pt endorses needing to have her vision checked.  Pt finds herself squinting a lot.   ROS: See pertinent positives and negatives per HPI.  Past Medical History:  Diagnosis Date  . Allergic rhinitis   . Asthma   . Eczema   . Environmental allergies    Age 23 years  . Fatty liver 12/11/2018   Noted on CT 6/20  . Generalized headaches    Began at age 58 years  . Influenza    Age 60 years  . 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 Streptococcal infection(041.00)    Age 69 and 23 years old  . 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 TONSILLECTOMY   2012  . WISDOM TOOTH EXTRACTION      Family History  Problem Relation Age of Onset  . Migraines Paternal Grandmother   . Migraines Paternal Grandfather   . HIV Father   . Diabetes Mother   . Hypertension Mother   . Anemia Mother   . COPD Mother   . CVA Mother   . Congestive Heart Failure Mother   . Cirrhosis Mother   . Obesity Mother   . Ulcers Brother   . Anemia Sister   . Cholecystitis Brother   . Migraines Paternal Aunt   . Migraines Maternal Uncle        Maternal Great Uncle  . Diabetes Other        Family History  . Hypertension Other        Family History  . Depression Other        Family History  . Asthma Other        Family History  . Allergic rhinitis Other        Family History  . Asthma Brother     Current Outpatient Medications:  .  albuterol (VENTOLIN HFA) 108 (90 Base) MCG/ACT inhaler, Inhale 2 puffs into the lungs every 6 (six) hours as needed for wheezing or shortness of breath., Disp: 18 g, Rfl: 1 .  cyclobenzaprine (FLEXERIL) 5 MG tablet, Take 1-2  tablets (5-10 mg total) by mouth 2 (two) times daily as needed for muscle spasms., Disp: 24 tablet, Rfl: 0 .  fluticasone (FLOVENT HFA) 110 MCG/ACT inhaler, Inhale 1 puff into the lungs 2 (two) times daily., Disp: 1 Inhaler, Rfl: 1 .  Fluticasone Furoate (ARNUITY ELLIPTA) 100 MCG/ACT AEPB, Inhale 1 Dose into the lungs daily., Disp: 30 each, Rfl: 5 .  ibuprofen (ADVIL) 600 MG tablet, Take 600 mg by mouth every 6 (six) hours as needed., Disp: , Rfl:  .  levocetirizine (XYZAL) 5 MG tablet, Take 1 tablet (5 mg total) by mouth every evening., Disp: 30 tablet, Rfl: 0 .  medroxyPROGESTERone (PROVERA) 5 MG tablet, Take one tablet a day for 5 days every one to two months if no spontaneous cycles., Disp: 15 tablet, Rfl: 3 .  naproxen (NAPROSYN) 500 MG tablet, Take 1 tablet (500 mg total) by mouth 2 (two) times daily., Disp: 30 tablet, Rfl: 0 .  omeprazole (PRILOSEC) 40 MG capsule, Take 1 capsule (40 mg total) by mouth  daily., Disp: 30 capsule, Rfl: 5 .  sertraline (ZOLOFT) 50 MG tablet, Take 1 tablet (50 mg total) by mouth daily., Disp: 30 tablet, Rfl: 0  EXAM:  VITALS per patient if applicable:  GENERAL: alert, oriented, appears well and in no acute distress  HEENT: atraumatic, conjunctiva clear, no obvious abnormalities on inspection of external nose and ears  NECK: normal movements of the head and neck  LUNGS: on inspection no signs of respiratory distress, breathing rate appears normal, no obvious gross SOB, gasping or wheezing  CV: no obvious cyanosis  MS: moves all visible extremities without noticeable abnormality  PSYCH/NEURO: pleasant and cooperative, no obvious depression or anxiety, speech and thought processing grossly intact  ASSESSMENT AND PLAN:  Discussed the following assessment and plan:  Chronic nonintractable headache, unspecified headache type  -increasing in frequency, but different from migraines. -discussed HA prevention. -pt to increase po intake of water and fluids.  Pt to work on sleep. -advised to have vision checked as eye strain can cause HAs -discussed peppermint oil, keeping a HA diary, ways to reduce stress. -consider imaging, CT head. -Prior CT head 08/17/2010 reviewed and normal. - Plan: butalbital-acetaminophen-caffeine (FIORICET) 50-325-40 MG tablet, Ambulatory referral to Neurology -for continue symptoms consider Toradol injection in clinic. -given precautions  Insomnia -discussed sleep hygiene -melatonin prn  F/u prn   I discussed the assessment and treatment plan with the patient. The patient was provided an opportunity to ask questions and all were answered. The patient agreed with the plan and demonstrated an understanding of the instructions.   The patient was advised to call back or seek an in-person evaluation if the symptoms worsen or if the condition fails to improve as anticipated.   Deeann Saint, MD

## 2019-03-31 NOTE — Telephone Encounter (Signed)
3 day ZIO XT long term holter monitor to be mailed to the patients home.  Instructions reviewed briefly as they are included in the monitor kit. 

## 2019-03-31 NOTE — Telephone Encounter (Signed)
Patient called to say that she have had a pain on the left side of her head about one week now. She does not know what it is goes to bed with it and wake up with it. Say that she take OTC medication and it does not help. Asking for a nurse to advise. Ph# [phone]   Reason for Disposition . [1] MODERATE headache (e.g., interferes with normal activities) AND [2] present > 24 hours AND [3] unexplained  (Exceptions: analgesics not tried, typical migraine, or headache part of viral illness)  Answer Assessment - Initial Assessment Questions 1. LOCATION: "Where does it hurt?"      Left front to left middle- top 2. ONSET: "When did the headache start?" (Minutes, hours or days)      4 days ago 3. PATTERN: "Does the pain come and go, or has it been constant since it started?"     constant 4. SEVERITY: "How bad is the pain?" and "What does it keep you from doing?"  (e.g., Scale 1-10; mild, moderate, or severe)   - MILD (1-3): doesn't interfere with normal activities    - MODERATE (4-7): interferes with normal activities or awakens from sleep    - SEVERE (8-10): excruciating pain, unable to do any normal activities        Severe- keeps patient from sleeping- working is difficult 5. RECURRENT SYMPTOM: "Have you ever had headaches before?" If so, ask: "When was the last time?" and "What happened that time?"      Yes- something like this in highschool- has seen neurology 6. CAUSE: "What do you think is causing the headache?"     Possible neck pain related- never finished evaluation 7. MIGRAINE: "Have you been diagnosed with migraine headaches?" If so, ask: "Is this headache similar?"      Yes- only different thing it is on one side 8. HEAD INJURY: "Has there been any recent injury to the head?"      no 9. OTHER SYMPTOMS: "Do you have any other symptoms?" (fever, stiff neck, eye pain, sore throat, cold symptoms)     Neck pain ( not new) 10. PREGNANCY: "Is there any chance you are pregnant?" "When  was your last menstrual period?"       No- LMP- now  Protocols used: HEADACHE-A-AH

## 2019-04-01 ENCOUNTER — Encounter: Payer: Self-pay | Admitting: Neurology

## 2019-04-02 ENCOUNTER — Telehealth: Payer: Self-pay | Admitting: Family Medicine

## 2019-04-02 NOTE — Telephone Encounter (Signed)
Copied from [place] [id number]. Topic: General - Other >> Apr 02, 2019  8:15 AM Keene Breath wrote: Reason for CRM: Patient called to inform the doctor that she is still having headaches and would like a referral for a CT scan.  Please advise and call to discuss at [phone]

## 2019-04-02 NOTE — Addendum Note (Signed)
Addended by: Crissie Reese on: 04/02/2019 02:36 PM   Modules accepted: Orders

## 2019-04-02 NOTE — Telephone Encounter (Signed)
[place] for CT order

## 2019-04-06 ENCOUNTER — Other Ambulatory Visit: Payer: Self-pay | Admitting: Family Medicine

## 2019-04-06 ENCOUNTER — Ambulatory Visit (INDEPENDENT_AMBULATORY_CARE_PROVIDER_SITE_OTHER): Payer: No Typology Code available for payment source

## 2019-04-06 DIAGNOSIS — R002 Palpitations: Secondary | ICD-10-CM

## 2019-04-06 DIAGNOSIS — R519 Headache, unspecified: Secondary | ICD-10-CM

## 2019-04-06 DIAGNOSIS — G8929 Other chronic pain: Secondary | ICD-10-CM

## 2019-04-06 NOTE — Telephone Encounter (Signed)
MRI brain ordered.

## 2019-04-07 NOTE — Telephone Encounter (Signed)
Spoke with pt aware that order for brain MRI was placed

## 2019-04-13 NOTE — Addendum Note (Signed)
Addended by: Billie Ruddy on: 04/13/2019 07:22 PM   Modules accepted: Orders

## 2019-04-14 ENCOUNTER — Other Ambulatory Visit: Payer: Self-pay | Admitting: Family Medicine

## 2019-04-14 DIAGNOSIS — R0982 Postnasal drip: Secondary | ICD-10-CM

## 2019-04-16 ENCOUNTER — Ambulatory Visit: Payer: No Typology Code available for payment source | Admitting: Cardiology

## 2019-04-28 ENCOUNTER — Telehealth: Payer: Self-pay | Admitting: Cardiology

## 2019-04-28 NOTE — Progress Notes (Signed)
Virtual Visit via Video Note The purpose of this virtual visit is to provide medical care while limiting exposure to the novel coronavirus.    Consent was obtained for video visit:  Yes Answered questions that patient had about telehealth interaction:  Yes I discussed the limitations, risks, security and privacy concerns of performing an evaluation and management service by telemedicine. I also discussed with the patient that there may be a patient responsible charge related to this service. The patient expressed understanding and agreed to proceed.  Pt location: Home Physician Location: office Name of referring provider:  Billie Ruddy, MD I connected with Merideth Abbey at patients initiation/request on 04/29/2019 at  7:50 AM EST by video enabled telemedicine application and verified that I am speaking with the correct person using two identifiers. Pt MRN:  [id number] Pt DOB:  17-Apr-1996 Video Participants:  Merideth Abbey   History of Present Illness:  Maria Smith is a 23 year old female who presents for headaches.  History supplemented by referring provider note.  Patient has history of migraines from age 16 until 56 and then they resolved.. They recurred 3 months ago, possibly related to stress.  She describes it as a 9/10 pressure like headache in a band-like distribution.  She has associated phonophobia but denies nausea, vomiting, photophobia or unilateral numbness or weakness.  In the past, she would see specks and blurriness in her vision, but that no longer occurs. They typically last a couple of hours but they have lasted a couple of days. She has had about 13 headache days over the past 4 weeks. She reports associated neck pain as well.  Increased neck pain aggravates it.  Strenuous activity or feeling excited causes pressure on top of her head.  Resting, laying in quiet place sometimes helps.  She is scheduled for an MRI of brain with and  without contrast on 05/06/2019.  LABS 03/05/2019:  B12 715; BMP with Na 140, K 4.2, Cl 103, CO2 29, glucose 82, BUN 8, Cr 0.80; CBC with WBC 4.2, HGB 13.1, HCT 39.9, PLT 300 12/30/2018:  ANA negative; Sed Rate 4; CRP 1; RF negative  Frequency of pain relievers:  3 days out of the week. Current NSAIDS:  Ibuprofen 600mg  Current analgesics:  Fioricet; Excedrin Current triptans:  none Current ergotamine:  none Current anti-emetic:  none Current muscle relaxants:  Flexeril Current anti-anxiolytic:  none Current sleep aide:  melatonin Current Antihypertensive medications:  none Current Antidepressant medications:  Sertraline 50mg  daily Current Anticonvulsant medications:  none Current anti-CGRP:  none Current Vitamins/Herbal/Supplements:  melatonin Current Antihistamines/Decongestants:  Xyzal Other therapy:  none Hormone/birth control:  none  Past NSAIDS:  Ibuprofen Past analgesics:  Excedrin (may trigger her asthma) Past abortive triptans:  Sumatriptan tablet Past abortive ergotamine:  none Past muscle relaxants:  none Past anti-emetic:  none Past antihypertensive medications:  none Past antidepressant medications:  Amitriptyline 25mg  (1 year) Past anticonvulsant medications:  topiramate (1 year Past anti-CGRP:  none Past vitamins/Herbal/Supplements:  none Past antihistamines/decongestants:  none Other past therapies:  none  Caffeine:  No coffee.  Rarely drinks tea.  No sodas Diet:  Drinks 4-5 bottles of water daily Exercise:  Not routine Depression:  no; Anxiety:  no.  However, she has been having to take on more responsibility since her mother was sick. Other pain:  Neck pain Sleep hygiene:  Poor.  Has tried melatonin.   She reports decreased visual acuity, often needing to squint.  She is looking at her phone frequently.  She has not yet followed up with the eye doctor  Family history of headache:  Mother (also history of strokes), paternal aunt  Past Medical History:  Past Medical History:  Diagnosis Date  . Allergic rhinitis   . Asthma   . Eczema   . Environmental allergies    Age 72 years  . Fatty liver 12/11/2018   Noted on CT 6/20  . Generalized headaches    Began at age 64 years  . Influenza    Age 24 years  . 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 Streptococcal infection(041.00)    Age 89 and 23 years old  . 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/2018   Diffuse enlargement on [place] 6/20    Medications: Outpatient Encounter Medications as of 04/29/2019  Medication Sig  . albuterol (VENTOLIN HFA) 108 (90 Base) MCG/ACT inhaler Inhale 2 puffs into the lungs every 6 (six) hours as needed for wheezing or shortness of breath.  . butalbital-acetaminophen-caffeine (FIORICET) 50-325-40 MG tablet Take 1 tablet by mouth every 8 (eight) hours as needed for headache.  . cyclobenzaprine (FLEXERIL) 5 MG tablet Take 1-2 tablets (5-10 mg total) by mouth 2 (two) times daily as needed for muscle spasms.  . fluticasone (FLOVENT HFA) 110 MCG/ACT inhaler Inhale 1 puff into the lungs 2 (two) times daily.  . Fluticasone Furoate (ARNUITY ELLIPTA) 100 MCG/ACT AEPB Inhale 1 Dose into the lungs daily.  Marland Kitchen ibuprofen (ADVIL) 600 MG tablet Take 600 mg by mouth every 6 (six) hours as needed.  Marland Kitchen levocetirizine (XYZAL) 5 MG tablet TAKE 1 TABLET BY MOUTH EVERY DAY IN THE EVENING  . medroxyPROGESTERone (PROVERA) 5 MG tablet Take one tablet a day for 5 days every one to two months if no spontaneous cycles.  . naproxen (NAPROSYN) 500 MG tablet Take 1 tablet (500 mg total) by mouth 2 (two) times daily.  Marland Kitchen omeprazole (PRILOSEC) 40 MG capsule Take 1 capsule (40 mg total) by mouth daily.  . sertraline (ZOLOFT) 50 MG tablet Take 1 tablet (50 mg total) by mouth daily.   No facility-administered encounter medications on file as of 04/29/2019.     Allergies: No Known Allergies  Family History: Family History  Problem Relation Age of Onset  . Migraines Paternal Grandmother   . Migraines  Paternal Grandfather   . HIV Father   . Diabetes Mother   . Hypertension Mother   . Anemia Mother   . COPD Mother   . CVA Mother   . Congestive Heart Failure Mother   . Cirrhosis Mother   . Obesity Mother   . Ulcers Brother   . Anemia Sister   . Cholecystitis Brother   . Migraines Paternal Aunt   . Migraines Maternal Uncle        Maternal Great Uncle  . Diabetes Other        Family History  . Hypertension Other        Family History  . Depression Other        Family History  . Asthma Other        Family History  . Allergic rhinitis Other        Family History  . Asthma Brother     Social History: Social History   Socioeconomic History  . Marital status: Married    Spouse name: Not on file  . Number of children: Not on file  . Years of education: Not on file  .  Highest education level: Not on file  Occupational History  . Not on file  Social Needs  . Financial resource strain: Not on file  . Food insecurity    Worry: Not on file    Inability: Not on file  . Transportation needs    Medical: Not on file    Non-medical: Not on file  Tobacco Use  . Smoking status: Never Smoker  . Smokeless tobacco: Never Used  Substance and Sexual Activity  . Alcohol use: Not Currently  . Drug use: No  . Sexual activity: Yes    Partners: Female    Comment: female partner  Lifestyle  . Physical activity    Days per week: Not on file    Minutes per session: Not on file  . Stress: Not on file  Relationships  . Social Musicianconnections    Talks on phone: Not on file    Gets together: Not on file    Attends religious service: Not on file    Active member of club or organization: Not on file    Attends meetings of clubs or organizations: Not on file    Relationship status: Not on file  . Intimate partner violence    Fear of current or ex partner: Not on file    Emotionally abused: Not on file    Physically abused: Not on file    Forced sexual activity: Not on file  Other  Topics Concern  . 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     Observations/Objective:   Height 4\' 10"  (1.473 m), weight 115 lb (52.2 kg). No acute distress.  Alert and oriented.  Speech fluent and not dysarthric.  Language intact.  Eyes orthophoric on primary gaze.  Face symmetric.  Assessment and Plan:   1.  Chronic migraine without aura, without status migrainosus, not intractable 2.  Chronic neck pain 3.  History of migraine with aura  1. MRI of brain scheduled.  2. For preventative management, start zonisamide 25mg  at bedtime for one month, then 50mg  at bedtime 3.  For abortive therapy, rizatriptan 10mg  4. Refer to Dr. Antoine PrimasZachary Shreffler of Sports Medicine to evaluate and treat neck pain. 5.  Limit use of pain relievers to no more than 2 days out of week to prevent risk of rebound or medication-overuse headache. 6.  Keep headache diary 7.  Exercise, hydration, caffeine cessation, sleep hygiene, monitor for and avoid triggers 8.  Consider:  magnesium citrate 400mg  daily, riboflavin 400mg  daily, and coenzyme Q10 100mg  three times daily 9. Follow up 4 months   Follow Up Instructions:    -I discussed the assessment and treatment plan with the patient. The patient was provided an opportunity to ask questions and all were answered. The patient agreed with the plan and demonstrated an understanding of the instructions.   The patient was advised to call back or seek an in-person evaluation if the symptoms worsen or if the condition fails to improve as anticipated.   Cira ServantAdam Robert Jaffe, DO

## 2019-04-28 NOTE — Telephone Encounter (Signed)
New Message    Pt is calling because she wanted to cancel her appt with Dr Gardiner Rhyme because she is not feeling well.  She is wondering if she can still get the results of her monitor over the phone     Please call

## 2019-04-28 NOTE — Telephone Encounter (Signed)
Will route to make MD aware- Results can be sent via mychart as it is active, will route to primary nurse.

## 2019-04-29 ENCOUNTER — Encounter: Payer: Self-pay | Admitting: Neurology

## 2019-04-29 ENCOUNTER — Ambulatory Visit: Payer: No Typology Code available for payment source | Admitting: Cardiology

## 2019-04-29 ENCOUNTER — Ambulatory Visit (INDEPENDENT_AMBULATORY_CARE_PROVIDER_SITE_OTHER): Payer: No Typology Code available for payment source | Admitting: Neurology

## 2019-04-29 ENCOUNTER — Other Ambulatory Visit: Payer: Self-pay

## 2019-04-29 ENCOUNTER — Telehealth: Payer: Self-pay | Admitting: Neurology

## 2019-04-29 VITALS — Ht <= 58 in | Wt 115.0 lb

## 2019-04-29 DIAGNOSIS — G8929 Other chronic pain: Secondary | ICD-10-CM | POA: Diagnosis not present

## 2019-04-29 DIAGNOSIS — G43709 Chronic migraine without aura, not intractable, without status migrainosus: Secondary | ICD-10-CM

## 2019-04-29 DIAGNOSIS — M542 Cervicalgia: Secondary | ICD-10-CM | POA: Diagnosis not present

## 2019-04-29 MED ORDER — RIZATRIPTAN BENZOATE 10 MG PO TABS: ORAL_TABLET | ORAL | 3 refills | Status: DC

## 2019-04-29 MED ORDER — ZONISAMIDE 25 MG PO CAPS: ORAL_CAPSULE | ORAL | 0 refills | Status: DC

## 2019-04-29 NOTE — Telephone Encounter (Signed)
Called patient and left a message to call back and schedule an appointment with Dr. Tomi Likens re: follow up in 4 months

## 2019-05-01 NOTE — Telephone Encounter (Signed)
Called patient and scheduled a follow up for a virtual visit, on 09/09/19 at 8:50 AM.

## 2019-05-05 ENCOUNTER — Telehealth (INDEPENDENT_AMBULATORY_CARE_PROVIDER_SITE_OTHER): Payer: No Typology Code available for payment source | Admitting: Family Medicine

## 2019-05-05 ENCOUNTER — Other Ambulatory Visit: Payer: Self-pay

## 2019-05-05 DIAGNOSIS — J069 Acute upper respiratory infection, unspecified: Secondary | ICD-10-CM

## 2019-05-05 DIAGNOSIS — J4521 Mild intermittent asthma with (acute) exacerbation: Secondary | ICD-10-CM

## 2019-05-05 NOTE — Telephone Encounter (Signed)
Informed pt once report is reviewed by MD, nurse will call with results. Pt verbalized understanding.

## 2019-05-05 NOTE — Progress Notes (Signed)
Virtual Visit via Video Note  I connected with Maria Smith on 05/05/19 at  1:00 PM EST by a video enabled telemedicine application 2/2 [id number] pandemic and verified that I am speaking with the correct person using two identifiers.  Location patient: home Location provider:work or home office Persons participating in the virtual visit: patient, provider  I discussed the limitations of evaluation and management by telemedicine and the availability of in person appointments. The patient expressed understanding and agreed to proceed.   HPI: Pt with productive cough (yellow/green sputum) worse at night and  rhinorrhea x 2 days.  States has some wheezing but resolved after using her inhaler.  Pt also notes a few HAs-seeing Neurology and constipation. Pt has taken day time cold medicine.  Pt had 2 negative COVID test, the last one was yesterday.  Pt denies fever, ear pain or pressure, facial pain or pressure, sore throat, n/v, myalgias, diarrhea.  Typically the change in weather causes pt to have asthma issues.  Pt has a home pulse oximeter.   ROS: See pertinent positives and negatives per HPI.  Past Medical History:  Diagnosis Date  . Allergic rhinitis   . Asthma   . Eczema   . Environmental allergies    Age 23 years  . Fatty liver 12/11/2018   Noted on CT 6/20  . Generalized headaches    Began at age 54 years  . Influenza    Age 22 years  . 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 Streptococcal infection(041.00)    Age 49 and 23 years old  . 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 TONSILLECTOMY  2012  . WISDOM TOOTH EXTRACTION      Family History  Problem Relation Age of Onset  . Migraines Paternal Grandmother   . Migraines Paternal Grandfather   . HIV Father   . Diabetes Mother   . Hypertension Mother   . Anemia Mother   . COPD Mother   . CVA Mother   . Congestive Heart  Failure Mother   . Cirrhosis Mother   . Obesity Mother   . Ulcers Brother   . Anemia Sister   . Cholecystitis Brother   . Migraines Paternal Aunt   . Migraines Maternal Uncle        Maternal Great Uncle  . Diabetes Other        Family History  . Hypertension Other        Family History  . Depression Other        Family History  . Asthma Other        Family History  . Allergic rhinitis Other        Family History  . Asthma Brother     Current Outpatient Medications:  .  albuterol (VENTOLIN HFA) 108 (90 Base) MCG/ACT inhaler, Inhale 2 puffs into the lungs every 6 (six) hours as needed for wheezing or shortness of breath., Disp: 18 g, Rfl: 1 .  butalbital-acetaminophen-caffeine (FIORICET) 50-325-40 MG tablet, Take 1 tablet by mouth every 8 (eight) hours as needed for headache., Disp: 20 tablet, Rfl: 0 .  fluticasone (FLOVENT HFA) 110 MCG/ACT inhaler, Inhale 1 puff into the lungs 2 (two) times daily., Disp: 1 Inhaler, Rfl: 1 .  Fluticasone Furoate (ARNUITY ELLIPTA) 100 MCG/ACT AEPB, Inhale 1 Dose into the lungs daily., Disp: 30 each, Rfl: 5 .  ibuprofen (ADVIL) 600 MG  tablet, Take 600 mg by mouth every 6 (six) hours as needed., Disp: , Rfl:  .  levocetirizine (XYZAL) 5 MG tablet, TAKE 1 TABLET BY MOUTH EVERY DAY IN THE EVENING, Disp: 30 tablet, Rfl: 0 .  medroxyPROGESTERone (PROVERA) 5 MG tablet, Take one tablet a day for 5 days every one to two months if no spontaneous cycles., Disp: 15 tablet, Rfl: 3 .  naproxen (NAPROSYN) 500 MG tablet, Take 1 tablet (500 mg total) by mouth 2 (two) times daily., Disp: 30 tablet, Rfl: 0 .  omeprazole (PRILOSEC) 40 MG capsule, Take 1 capsule (40 mg total) by mouth daily., Disp: 30 capsule, Rfl: 5 .  rizatriptan (MAXALT) 10 MG tablet, Take 1 tablet earliest onset of migraine.  May repeat in 2 hours if needed.  Maximum 2 tablets in 24 hours, Disp: 10 tablet, Rfl: 3 .  sertraline (ZOLOFT) 50 MG tablet, Take 1 tablet (50 mg total) by mouth daily., Disp: 30  tablet, Rfl: 0 .  zonisamide (ZONEGRAN) 25 MG capsule, Take 1 capsule at bedtime for one week, then increase to 2 capsules at bedtime., Disp: 60 capsule, Rfl: 0  EXAM:  VITALS per patient if applicable:  RR between 12-20 bpm, pO2 97%  GENERAL: alert, oriented, appears well and in no acute distress  HEENT: atraumatic, conjunctiva clear, no obvious abnormalities on inspection of external nose and ears  NECK: normal movements of the head and neck  LUNGS: on inspection no signs of respiratory distress, breathing rate appears normal, no obvious gross SOB, gasping or wheezing  CV: no obvious cyanosis  MS: moves all visible extremities without noticeable abnormality  PSYCH/NEURO: pleasant and cooperative, no obvious depression or anxiety, speech and thought processing grossly intact  ASSESSMENT AND PLAN:  Discussed the following assessment and plan:  Viral URI with cough -supportive care: rest, fluids, Mucinex, flonase, etc -reassuring that COVID test yesterday was negative. -given precautions  Mild intermittent asthma with (acute) exacerbation -continue using albuterol inhaler prn and flovent hfa 110 mcg BID -avoid allergens when able -pt advised to keep a coat nearby for the constantly changing weather  F/u prn for continued or worsened symptoms.  I discussed the assessment and treatment plan with the patient. The patient was provided an opportunity to ask questions and all were answered. The patient agreed with the plan and demonstrated an understanding of the instructions.   The patient was advised to call back or seek an in-person evaluation if the symptoms worsen or if the condition fails to improve as anticipated.   Deeann Saint, MD

## 2019-05-06 ENCOUNTER — Other Ambulatory Visit: Payer: No Typology Code available for payment source

## 2019-05-07 ENCOUNTER — Telehealth: Payer: Self-pay | Admitting: *Deleted

## 2019-05-07 NOTE — Telephone Encounter (Signed)
Patient called after hours line this morning. Patient reports for past two weeks having upper left abdominal pain, getting worse. Had appt this past monday or tuesday did not say anything cause it went away. Happens more after eating or drinking anything (not alcohol). Was vegan for 7 months, has been eating diary now to diet. What to do? pain never radiates when it happens.

## 2019-05-07 NOTE — Telephone Encounter (Signed)
Dr Volanda Napoleon was informed of the message below and advised the pt begin taking Omeprazole 20 mg daily to see if that helps since the pain is after eating.  Patient was informed and agreed to begin Omeprazole.

## 2019-05-21 ENCOUNTER — Other Ambulatory Visit: Payer: Self-pay

## 2019-05-21 ENCOUNTER — Ambulatory Visit
Admission: RE | Admit: 2019-05-21 | Discharge: 2019-05-21 | Disposition: A | Discharge status: Home / Self Care | Payer: No Typology Code available for payment source | Source: Ambulatory Visit | Attending: Family Medicine | Admitting: Family Medicine

## 2019-05-21 DIAGNOSIS — G8929 Other chronic pain: Secondary | ICD-10-CM

## 2019-05-21 DIAGNOSIS — R519 Headache, unspecified: Secondary | ICD-10-CM

## 2019-05-21 MED ORDER — GADOBENATE DIMEGLUMINE 529 MG/ML IV SOLN
10.0000 mL | Freq: Once | INTRAVENOUS | Status: AC | PRN
  Administered 2019-05-21: 10 mL via INTRAVENOUS

## 2019-06-03 ENCOUNTER — Other Ambulatory Visit: Payer: Self-pay

## 2019-06-03 ENCOUNTER — Ambulatory Visit (INDEPENDENT_AMBULATORY_CARE_PROVIDER_SITE_OTHER): Payer: No Typology Code available for payment source

## 2019-06-03 ENCOUNTER — Telehealth (INDEPENDENT_AMBULATORY_CARE_PROVIDER_SITE_OTHER): Payer: No Typology Code available for payment source | Admitting: Family Medicine

## 2019-06-03 ENCOUNTER — Other Ambulatory Visit: Payer: No Typology Code available for payment source

## 2019-06-03 DIAGNOSIS — G43809 Other migraine, not intractable, without status migrainosus: Secondary | ICD-10-CM

## 2019-06-03 DIAGNOSIS — J4530 Mild persistent asthma, uncomplicated: Secondary | ICD-10-CM

## 2019-06-03 DIAGNOSIS — R0602 Shortness of breath: Secondary | ICD-10-CM

## 2019-06-03 DIAGNOSIS — F4321 Adjustment disorder with depressed mood: Secondary | ICD-10-CM

## 2019-06-03 MED ORDER — ALBUTEROL SULFATE HFA 108 (90 BASE) MCG/ACT IN AERS: 2.0000 | INHALATION_SPRAY | Freq: Four times a day (QID) | RESPIRATORY_TRACT | 5 refills | Status: DC | PRN

## 2019-06-03 MED ORDER — BENZONATATE 100 MG PO CAPS: 100.0000 mg | ORAL_CAPSULE | Freq: Two times a day (BID) | ORAL | 0 refills | Status: DC | PRN

## 2019-06-03 MED ORDER — FLUTICASONE PROPIONATE HFA 110 MCG/ACT IN AERO: 1.0000 | INHALATION_SPRAY | Freq: Two times a day (BID) | RESPIRATORY_TRACT | 5 refills | Status: DC

## 2019-06-03 NOTE — Progress Notes (Signed)
Virtual Visit via Video Note  I connected with Maria LoaderInfinity McQueen Smith on 06/03/19 at  2:00 PM EST by a video enabled telemedicine application 2/2 COVID-19 pandemic and verified that I am speaking with the correct person using two identifiers.  Location patient: home Location provider:work or home office Persons participating in the virtual visit: patient, provider  I discussed the limitations of evaluation and management by telemedicine and the availability of in person appointments. The patient expressed understanding and agreed to proceed.   HPI: Pt is a 23 yo female with pmh sig for asthma, GERD, migraines.   with chest discomfort, tightness, warm/burning sensation not like heart burn.  Notes "certain things take a lot out of me".    Notes stress and EtOH intake will cause increased symptoms.   Pt has some SOB with going up stairs.  Coughs to make her chest feel better.  Tried nyquil, elderberry, mucinex.  Sometimes the inhaler and breathing treatments help.   Denies wheezing.  Had a negative COVID test on Monday.  Pt's mom died on Monday.  She was on hospice.  Was doing well until went into a coma over the weekend.  Pt is looking into counseling as having a difficult time.  Pt's wife is pregnant.    Pt notes improvement in HAs when drinking more water and sleeping more.  Pt feels some relief knowing the MRI brain was negative.  Pt has not started the medication recommended by Neuro.   ROS: See pertinent positives and negatives per HPI.  Past Medical History:  Diagnosis Date  . Allergic rhinitis   . Asthma   . Eczema   . Environmental allergies    Age 28 years  . Fatty liver 12/11/2018   Noted on CT 6/20  . Generalized headaches    Began at age 23 years  . Influenza    Age 72 years  . 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 Streptococcal infection(041.00)    Age 23 and 23 years old  . 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/2018   Diffuse enlargement on US 6/20    Past Surgical  History:  Procedure Laterality Date  . TONSILLECTOMY  2012  . WISDOM TOOTH EXTRACTION      Family History  Problem Relation Age of Onset  . Migraines Paternal Grandmother   . Migraines Paternal Grandfather   . HIV Father   . Diabetes Mother   . Hypertension Mother   . Anemia Mother   . COPD Mother   . CVA Mother   . Congestive Heart Failure Mother   . Cirrhosis Mother   . Obesity Mother   . Ulcers Brother   . Anemia Sister   . Cholecystitis Brother   . Migraines Paternal Aunt   . Migraines Maternal Uncle        Maternal Great Uncle  . Diabetes Other        Family History  . Hypertension Other        Family History  . Depression Other        Family History  . Asthma Other        Family History  . Allergic rhinitis Other        Family History  . Asthma Brother      Current Outpatient Medications:  .  albuterol (VENTOLIN HFA) 108 (90 Base) MCG/ACT inhaler, Inhale 2 puffs into the lungs every 6 (six) hours as needed for wheezing or shortness of breath., Disp: 18 g, Rfl: 1 .  butalbital-acetaminophen-caffeine (FIORICET) 50-325-40 MG tablet, Take 1 tablet by mouth every 8 (eight) hours as needed for headache., Disp: 20 tablet, Rfl: 0 .  fluticasone (FLOVENT HFA) 110 MCG/ACT inhaler, Inhale 1 puff into the lungs 2 (two) times daily., Disp: 1 Inhaler, Rfl: 1 .  Fluticasone Furoate (ARNUITY ELLIPTA) 100 MCG/ACT AEPB, Inhale 1 Dose into the lungs daily., Disp: 30 each, Rfl: 5 .  ibuprofen (ADVIL) 600 MG tablet, Take 600 mg by mouth every 6 (six) hours as needed., Disp: , Rfl:  .  levocetirizine (XYZAL) 5 MG tablet, TAKE 1 TABLET BY MOUTH EVERY DAY IN THE EVENING, Disp: 30 tablet, Rfl: 0 .  medroxyPROGESTERone (PROVERA) 5 MG tablet, Take one tablet a day for 5 days every one to two months if no spontaneous cycles., Disp: 15 tablet, Rfl: 3 .  naproxen (NAPROSYN) 500 MG tablet, Take 1 tablet (500 mg total) by mouth 2 (two) times daily., Disp: 30 tablet, Rfl: 0 .  omeprazole  (PRILOSEC) 40 MG capsule, Take 1 capsule (40 mg total) by mouth daily., Disp: 30 capsule, Rfl: 5 .  rizatriptan (MAXALT) 10 MG tablet, Take 1 tablet earliest onset of migraine.  May repeat in 2 hours if needed.  Maximum 2 tablets in 24 hours, Disp: 10 tablet, Rfl: 3 .  sertraline (ZOLOFT) 50 MG tablet, Take 1 tablet (50 mg total) by mouth daily., Disp: 30 tablet, Rfl: 0 .  zonisamide (ZONEGRAN) 25 MG capsule, Take 1 capsule at bedtime for one week, then increase to 2 capsules at bedtime., Disp: 60 capsule, Rfl: 0  EXAM:  VITALS per patient if applicable:  RR between 12-20 bpm   GENERAL: alert, oriented, appears mildly depressed, tearful, but in no acute distress  HEENT: atraumatic, conjunctiva clear, no obvious abnormalities on inspection of external nose and ears  NECK: normal movements of the head and neck  LUNGS: Occasional cough.  On inspection no signs of respiratory distress, breathing rate appears normal, no obvious gross SOB, gasping or wheezing  CV: no obvious cyanosis  MS: moves all visible extremities without noticeable abnormality  PSYCH/NEURO: pleasant and cooperative, no obvious depression or anxiety, speech and thought processing grossly intact  ASSESSMENT AND PLAN:  Discussed the following assessment and plan:  SOB (shortness of breath)  -Discussed possible causes including stress/anxiety, asthma attack, worsening GERD -Continue omeprazole daily -Continue inhalers as needed -We will obtain CXR -We will start Tessalon for cough - Plan: Ambulatory referral to Pulmonology, DG Chest 2 View  Mild persistent asthma without complication  -Discussed obtaining PFTs -We will place referral to pulmonology - Plan: Ambulatory referral to Pulmonology, DG Chest 2 View, albuterol (VENTOLIN HFA) 108 (90 Base) MCG/ACT inhaler, fluticasone (FLOVENT HFA) 110 MCG/ACT inhaler  Other migraine without status migrainosus, not intractable -Continue hydration, decreasing caffeine  intake, getting plenty of rest For continued headache symptoms consider starting medications -MRI of brain with and without contrast 05/2019 normal  -Continue follow-up with neurology as needed  Grief -2/2 loss of her mother 3 days ago. -Patient encouraged to try counseling -We will continue to monitor  Follow-up as needed in the next few weeks   I discussed the assessment and treatment plan with the patient. The patient was provided an opportunity to ask questions and all were answered. The patient agreed with the plan and demonstrated an understanding of the instructions.   The patient was advised to call back or seek an in-person evaluation if the symptoms worsen or if the condition fails to improve as anticipated.  Deeann Saint, MD   This note is not being shared with the patient for the following reason: To prevent harm (release of this note would result in harm to the life or physical safety of the patient or another).

## 2019-06-04 ENCOUNTER — Encounter: Payer: Self-pay | Admitting: Family Medicine

## 2019-06-11 ENCOUNTER — Other Ambulatory Visit: Payer: No Typology Code available for payment source

## 2019-06-19 HISTORY — PX: ESOPHAGOGASTRODUODENOSCOPY ENDOSCOPY: SHX5814

## 2019-06-24 ENCOUNTER — Telehealth: Payer: Self-pay | Admitting: Cardiology

## 2019-06-24 NOTE — Telephone Encounter (Signed)
Pt advised and will talk with her PCP for any further problems.

## 2019-06-24 NOTE — Telephone Encounter (Signed)
Pt called to report that her HR has been in the 40's at night and 80's during the day.. she denies dizziness, syncope, SOB..  She says that her BP has been consistently around 105/80... she denies palpitations... she was just wanting to be sure this is "normal".... she says her HR is in the 40's mostly at night when she is resting.   I have advised her that it is not abnormal to have a lower HR at night when resting as long as she feels well and she reports that she does.. I have also advised her to be sure that she is drinking enough fluids throughout the day. Her HR seems to be consistent with her monitor that she had done with Dr. Bjorn Pippin but I will forward to him for review to be sure no further recommendations.

## 2019-06-24 NOTE — Telephone Encounter (Signed)
STAT if HR is under 50 or over 120 (normal HR is 60-100 beats per minute)  1) What is your heart rate? Checked it while on phone, states she was up moving around and it was 80.  2) Do you have a log of your heart rate readings (document readings)? Yes. Mostly night readings:  06/21/19: 8PM: 50   06/22/19: 10PM 49   06/23/19:  9PM:53  3) Do you have any other symptoms? HR drops to the 50's or low 60's at rest. Sometimes goes to 40's at time, mostly at night.. She wants to know if this is normal.

## 2019-06-24 NOTE — Telephone Encounter (Signed)
I agree, normal to have lower HR at night.  Nothing concerning on recent monitor.  Would not recommend any further work-up at this time

## 2019-06-26 ENCOUNTER — Ambulatory Visit: Payer: Self-pay | Admitting: *Deleted

## 2019-06-26 ENCOUNTER — Other Ambulatory Visit: Payer: Self-pay

## 2019-06-26 ENCOUNTER — Telehealth (INDEPENDENT_AMBULATORY_CARE_PROVIDER_SITE_OTHER): Payer: No Typology Code available for payment source | Admitting: Family Medicine

## 2019-06-26 DIAGNOSIS — R519 Headache, unspecified: Secondary | ICD-10-CM

## 2019-06-26 DIAGNOSIS — R112 Nausea with vomiting, unspecified: Secondary | ICD-10-CM | POA: Diagnosis not present

## 2019-06-26 MED ORDER — ONDANSETRON 4 MG PO TBDP: 4.0000 mg | ORAL_TABLET | Freq: Three times a day (TID) | ORAL | 0 refills | Status: DC | PRN

## 2019-06-26 NOTE — Progress Notes (Signed)
This visit type was conducted due to national recommendations for restrictions regarding the COVID-19 pandemic in an effort to limit this patient's exposure and mitigate transmission in our community.   Virtual Visit via Telephone Note  I connected with Maria Smith on 06/26/19 at  3:00 PM EST by telephone and verified that I am speaking with the correct person using two identifiers.   I discussed the limitations, risks, security and privacy concerns of performing an evaluation and management service by telephone and the availability of in person appointments. I also discussed with the patient that there may be a patient responsible charge related to this service. The patient expressed understanding and agreed to proceed.  Location patient: home Location provider: work or home office Participants present for the call: patient, provider Patient did not have a visit in the prior 7 days to address this/these issue(s).   History of Present Illness: Patient relates 2-day history of some nausea and one episode of vomiting earlier today.  She had some mild headache few days ago.  She does have history of migraines but this is somewhat different.  Her headache is not present currently.  She has had some decreased appetite.  She had some intermittent pain left upper quadrant.  No known history of ulcers.  No melena.  She thinks she may have had some low-grade fever yesterday.  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  She has had occasional loose stools.  She has some chronic mild cough ( she thinks related to her asthma) which is unchanged.  No recent melena.  She has irregular menses.  Last menstrual period was November 28.  She has reported PCOS.  She is married and has female partner so no risk for pregnancy.   Observations/Objective: Patient sounds cheerful and well on the phone. I do not appreciate any SOB. Speech and thought  processing are grossly intact. Patient reported vitals:  Assessment and Plan:  Nausea and 1 episode of vomiting.  Etiology unclear.  We discussed issues of further Covid testing and she plans to get this done early next week through her work as usual.  -Sent in Zofran 4 mg ODT 1 every 8 hours as needed [employer] of fluids and bland diet as tolerated -Recommend follow-up with her primary Dr. Salomon Fick for any persistent or worsening symptoms  Follow Up Instructions:  -as above   [id number] [id number] [id number] 11-20 [id number] 21-30 I did not refer this patient for an OV in the next 24 hours for this/these issue(s).  I discussed the assessment and treatment plan with the patient. The patient was provided an opportunity to ask questions and all were answered. The patient agreed with the plan and demonstrated an understanding of the instructions.   The patient was advised to call back or seek an in-person evaluation if the symptoms worsen or if the condition fails to improve as anticipated.  I provided 21 minutes of non-face-to-face time during this encounter.   Evelena Peat, MD

## 2019-06-26 NOTE — Telephone Encounter (Signed)
Patient stared having nausea 2-3 days, low grade temp, abdominal pain. Per protocol- patient advised ED- she does not want to go. She is requesting appointment with PCP. Patient states COVID test - negative - not in chart  Reason for Disposition . [1] SEVERE pain (e.g., excruciating) AND [2] present > 1 hour  Answer Assessment - Initial Assessment Questions 1. LOCATION: "Where does it hurt?"      midline and upper under ribs on both sides 2. RADIATION: "Does the pain shoot anywhere else?" (e.g., chest, back)     no 3. ONSET: "When did the pain begin?" (e.g., minutes, hours or days ago)      2 days ago 4. SUDDEN: "Gradual or sudden onset?"     sudden 5. PATTERN "Does the pain come and go, or is it constant?"    - If constant: "Is it getting better, staying the same, or worsening?"      (Note: Constant means the pain never goes away completely; most serious pain is constant and it progresses)     - If intermittent: "How long does it last?" "Do you have pain now?"     (Note: Intermittent means the pain goes away completely between bouts)     constant 6. SEVERITY: "How bad is the pain?"  (e.g., Scale 1-10; mild, moderate, or severe)   - MILD (1-3): doesn't interfere with normal activities, abdomen soft and not tender to touch    - MODERATE (4-7): interferes with normal activities or awakens from sleep, tender to touch    - SEVERE (8-10): excruciating pain, doubled over, unable to do any normal activities      8- severe 7. RECURRENT SYMPTOM: "Have you ever had this type of abdominal pain before?" If so, ask: "When was the last time?" and "What happened that time?"      Long time ago- reflux/constipation causes- short lived 8. CAUSE: "What do you think is causing the abdominal pain?"     Not sure 9. RELIEVING/AGGRAVATING FACTORS: "What makes it better or worse?" (e.g., movement, antacids, bowel movement)     Reflux medication has not helped, BM have not helped 10. OTHER SYMPTOMS: "Has there  been any vomiting, diarrhea, constipation, or urine problems?"       Loose stool, headache 11. PREGNANCY: "Is there any chance you are pregnant?" "When was your last menstrual period?"       No- LMP- early December  Protocols used: ABDOMINAL PAIN - UPPER-A-AH, ABDOMINAL PAIN - FEMALE-A-AH

## 2019-06-26 NOTE — Telephone Encounter (Signed)
Message Routed to PCP CMA 

## 2019-06-26 NOTE — Telephone Encounter (Signed)
Pt should be advised to go to the UC or ED given fever, abdominal pain, nausea as would benefit from labs and possible imaging.

## 2019-06-26 NOTE — Telephone Encounter (Signed)
Spoke with pt verbalized understanding that Dr Salomon Fick advise her to go to UC/ ED. Pt state that she will go

## 2019-06-26 NOTE — Telephone Encounter (Signed)
Pt denies to go to ED wants to have a virtual today. Ok to schedule

## 2019-07-03 ENCOUNTER — Telehealth (INDEPENDENT_AMBULATORY_CARE_PROVIDER_SITE_OTHER): Payer: No Typology Code available for payment source | Admitting: Family Medicine

## 2019-07-03 ENCOUNTER — Ambulatory Visit: Payer: Self-pay

## 2019-07-03 DIAGNOSIS — R0982 Postnasal drip: Secondary | ICD-10-CM

## 2019-07-03 DIAGNOSIS — K219 Gastro-esophageal reflux disease without esophagitis: Secondary | ICD-10-CM | POA: Diagnosis not present

## 2019-07-03 MED ORDER — OMEPRAZOLE 40 MG PO CPDR: 40.0000 mg | DELAYED_RELEASE_CAPSULE | Freq: Two times a day (BID) | ORAL | 3 refills | Status: DC

## 2019-07-03 NOTE — Telephone Encounter (Signed)
Patient scheduled virtual visit today at 1 pm.

## 2019-07-03 NOTE — Telephone Encounter (Signed)
Pt. called to report 2 day hx of clearing throat of "pink-tinged mucus with streaks of bright red blood."  Denied actual coughing.  Stated has hx of asthma.  Reported she has frequent mucus collection in throat x 1 yr., that she is often clearing and spitting out.  Denied shortness of breath associated with increased mucus.  Reported intermittent chest discomfort in mid - upper chest that radiates to lower chest.  Reported had COVID test on 1/11; test result negative. 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 with chest pain.  Denied fever/ chills; denied any other signs of bleeding.    Called [facility] line; transferred pt. To office for Virtual appt. To be scheduled.  Will send Triage note to PCP for her review.        Reason for Disposition . [1] More than one episode of coughing up blood AND [2] < 1 tablespoon (15 ml) of pure red blood    Pt. Reported she constantly clears mucus that feels congested in her throat; denied actual cough.  Reported clearing and spitting out "pink-tinged mucus with bright red streaks of blood x 2 days."  Answer Assessment - Initial Assessment Questions 1. ONSET: "When did you start coughing up blood?"     Two days ago  2. SEVERITY: "How many times?" "How much blood?" (e.g., flecks, streaks, tablespoons, etc)     Streaks of bright red blood in pink tinged mucus 3. COUGHING SPASMS: "Did the blood appear after a coughing spell?"       Denied cough 4. RESPIRATORY DISTRESS: "Describe your breathing."      Denied any resp. distress 5. FEVER: "Do you have a fever?" If so, ask: "What is your temperature, how was it measured, and when did it start?"     Denied 6. SPUTUM: "Describe the color of your sputum" (clear, white, yellow, green), "Has there been any change recently?"     Reported has had a lot of mucus in throat for one year; color changes from yellow to pink ; very persistent with mucus in her throat 7. CARDIAC HISTORY: "Do you  have any history of heart disease?" (e.g., heart attack, congestive heart failure)      *No Answer* 8. LUNG HISTORY: "Do you have any history of lung disease?"  (e.g., pulmonary embolus, asthma, emphysema)     Hx of asthma 9. PE RISK FACTORS: "Do you have a history of blood clots?" (or: recent major surgery, recent prolonged travel, bedridden)     *No Answer* 10. OTHER SYMPTOMS: "Do you have any other symptoms?" (e.g., nosebleed, chest pain, abdominal pain, vomiting)       Denied other signs of bleeding, reported soreness in throat at times; reported intermittent mid-upper chest discomfort; radiates to lower chest; c/o intermittent abd discomfort; denied sweating, nausea, or feeling faint  11. PREGNANCY: "Is there any chance you are pregnant?" "When was your last menstrual period?"       LMP- just finishing cycle now (started 1/11) 12. TRAVEL: "Have you traveled out of the country in the last month?" (e.g., travel history, exposures)       *No Answer*  Protocols used: COUGHING UP BLOOD-A-AH

## 2019-07-03 NOTE — Telephone Encounter (Signed)
FYI. Noted

## 2019-07-03 NOTE — Progress Notes (Signed)
Virtual Visit via Video Note  I connected with Maria Smith on 07/03/19 at  1:00 PM EST by a video enabled telemedicine application 2/2 [id number] pandemic and verified that I am speaking with the correct person using two identifiers.  Location patient: home Location provider:work or home office Persons participating in the virtual visit: patient, provider  I discussed the limitations of evaluation and management by telemedicine and the availability of in person appointments. The patient expressed understanding and agreed to proceed.   HPI: Pt is a pleasant 24 yo female with pmh sig for h/o asthma, eczema, allergies, migraines/HAs.  Seen for continued nasal drainage and mucus in throat.  Pt has noticed tinges of blood in her mucus at times bright red.  Pt notes continued abdominal discomfort, n/v, wt loss from not being able to eat.  Taking Omeprazole 40 mg daily which helps some.  Pt stopped drinking EtOH at the beginning of 2020.  Pt notes family h/o "stomach issues" including ulcers, hiatal hernia in mom and siblings.    Pt wants to get healthy as she and her partner are expecting twins.  Due date is August 8th.  ROS: See pertinent positives and negatives per HPI.  Past Medical History:  Diagnosis Date  . Allergic rhinitis   . Asthma   . Eczema   . Environmental allergies    Age 50 years  . Fatty liver 12/11/2018   Noted on CT 6/20  . Generalized headaches    Began at age 89 years  . Influenza    Age 65 years  . 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 Streptococcal infection(041.00)    Age 18 and 24 years old  . 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 TONSILLECTOMY  2012  . WISDOM TOOTH EXTRACTION      Family History  Problem Relation Age of Onset  . Migraines Paternal Grandmother   . Migraines Paternal Grandfather   . HIV Father   . Diabetes Mother   . Hypertension Mother    . Anemia Mother   . COPD Mother   . CVA Mother   . Congestive Heart Failure Mother   . Cirrhosis Mother   . Obesity Mother   . Ulcers Brother   . Anemia Sister   . Cholecystitis Brother   . Migraines Paternal Aunt   . Migraines Maternal Uncle        Maternal Great Uncle  . Diabetes Other        Family History  . Hypertension Other        Family History  . Depression Other        Family History  . Asthma Other        Family History  . Allergic rhinitis Other        Family History  . Asthma Brother      Current Outpatient Medications:  .  albuterol (VENTOLIN HFA) 108 (90 Base) MCG/ACT inhaler, Inhale 2 puffs into the lungs every 6 (six) hours as needed for wheezing or shortness of breath., Disp: 18 g, Rfl: 5 .  benzonatate (TESSALON) 100 MG capsule, Take 1 capsule (100 mg total) by mouth 2 (two) times daily as needed for cough., Disp: 20 capsule, Rfl: 0 .  butalbital-acetaminophen-caffeine (FIORICET) 50-325-40 MG tablet, Take 1 tablet by mouth every 8 (eight) hours as needed for headache., Disp: 20 tablet, Rfl: 0 .  fluticasone (FLOVENT HFA) 110 MCG/ACT inhaler, Inhale 1 puff into the lungs 2 (two) times daily., Disp: 1 Inhaler, Rfl: 5 .  Fluticasone Furoate (ARNUITY ELLIPTA) 100 MCG/ACT AEPB, Inhale 1 Dose into the lungs daily., Disp: 30 each, Rfl: 5 .  ibuprofen (ADVIL) 600 MG tablet, Take 600 mg by mouth every 6 (six) hours as needed., Disp: , Rfl:  .  levocetirizine (XYZAL) 5 MG tablet, TAKE 1 TABLET BY MOUTH EVERY DAY IN THE EVENING, Disp: 30 tablet, Rfl: 0 .  medroxyPROGESTERone (PROVERA) 5 MG tablet, Take one tablet a day for 5 days every one to two months if no spontaneous cycles., Disp: 15 tablet, Rfl: 3 .  naproxen (NAPROSYN) 500 MG tablet, Take 1 tablet (500 mg total) by mouth 2 (two) times daily., Disp: 30 tablet, Rfl: 0 .  omeprazole (PRILOSEC) 40 MG capsule, Take 1 capsule (40 mg total) by mouth daily., Disp: 30 capsule, Rfl: 5 .  ondansetron (ZOFRAN ODT) 4 MG  disintegrating tablet, Take 1 tablet (4 mg total) by mouth every 8 (eight) hours as needed for nausea or vomiting., Disp: 15 tablet, Rfl: 0 .  rizatriptan (MAXALT) 10 MG tablet, Take 1 tablet earliest onset of migraine.  May repeat in 2 hours if needed.  Maximum 2 tablets in 24 hours, Disp: 10 tablet, Rfl: 3 .  sertraline (ZOLOFT) 50 MG tablet, Take 1 tablet (50 mg total) by mouth daily., Disp: 30 tablet, Rfl: 0 .  zonisamide (ZONEGRAN) 25 MG capsule, Take 1 capsule at bedtime for one week, then increase to 2 capsules at bedtime., Disp: 60 capsule, Rfl: 0  EXAM:  VITALS per patient if applicable:  RR between 12-20 bpm  GENERAL: alert, oriented, appears well and in no acute distress  HEENT: atraumatic, conjunctiva clear, no obvious abnormalities on inspection of external nose and ears  NECK: normal movements of the head and neck  LUNGS: on inspection no signs of respiratory distress, breathing rate appears normal, no obvious gross SOB, gasping or wheezing  CV: no obvious cyanosis  MS: moves all visible extremities without noticeable abnormality  PSYCH/NEURO: pleasant and cooperative, no obvious depression or anxiety, speech and thought processing grossly intact  ASSESSMENT AND PLAN:  Discussed the following assessment and plan:  Gastroesophageal reflux disease, unspecified whether esophagitis present  -worsening symptoms -will increase omeprazole to BID -avoid foods known to cause symptoms. -referral to GI for possible EGD. - Plan: omeprazole (PRILOSEC) 40 MG capsule, Ambulatory referral to Gastroenterology  Post-nasal drainage -likely worsened by acid reflux. -continue omeprazole and allergy med prn.  F/u prn  I discussed the assessment and treatment plan with the patient. The patient was provided an opportunity to ask questions and all were answered. The patient agreed with the plan and demonstrated an understanding of the instructions.   The patient was advised to call  back or seek an in-person evaluation if the symptoms worsen or if the condition fails to improve as anticipated.   Deeann Saint, MD

## 2019-07-06 ENCOUNTER — Encounter: Payer: Self-pay | Admitting: Gastroenterology

## 2019-07-23 ENCOUNTER — Other Ambulatory Visit: Payer: Self-pay

## 2019-07-23 ENCOUNTER — Ambulatory Visit (INDEPENDENT_AMBULATORY_CARE_PROVIDER_SITE_OTHER)
Admission: RE | Admit: 2019-07-23 | Discharge: 2019-07-23 | Disposition: A | Discharge status: Other Acute Inpatient Hospital | Payer: Self-pay | Source: Ambulatory Visit

## 2019-07-23 DIAGNOSIS — R1031 Right lower quadrant pain: Secondary | ICD-10-CM

## 2019-07-23 NOTE — Discharge Instructions (Addendum)
Go to the emergency department or urgent care for evaluation of your symptoms.

## 2019-07-23 NOTE — ED Provider Notes (Signed)
Virtual Visit via Video Note:  Maria Smith  initiated request for Telemedicine visit with [facility] team. I connected with Maria Smith  on 07/23/2019 at 5:44 PM  for a synchronized telemedicine visit using a video enabled HIPPA compliant telemedicine application. I verified that I am speaking with Maria Smith  using two identifiers. Mickie Bail, NP  was physically located in a [facility] Urgent care site and [name] was located at a different location.   The limitations of evaluation and management by telemedicine as well as the availability of in-person appointments were discussed. Patient was informed that she  may incur a bill ( including co-pay) for this virtual visit encounter. Maria Smith  expressed understanding and gave verbal consent to proceed with virtual visit.     History of Present Illness:Maria Smith  is a 24 y.o. female presents for evaluation of left groin pain x 1 day.  She describes the pain as "similar to a ruptured ovarian cyst" in May 2020; sharp and achy; worse with bending and movement; improves with rest and heating pad.  She also reports dysuria and pelvic pain.  She denies fever, chills, abdominal pain, vomiting, diarrhea, constipation, vaginal discharge.  LMP: 07/06/2019.     No Known Allergies Past Medical History:  Diagnosis Date  . Allergic rhinitis   . Asthma   . Eczema   . Environmental allergies    Age 50 years  . Fatty liver 12/11/2018   Noted on CT 6/20  . Generalized headaches    Began at age 49 years  . Influenza    Age 33 years  . 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 Streptococcal infection(041.00)    Age 59 and 24 years old  . 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 Smoking status: Never Smoker  . Smokeless tobacco: Never Used  Substance Use Topics  . Alcohol use: Not Currently   . Drug use: No        Observations/Objective: Physical Exam  VITALS: Patient denies fever. GENERAL: Alert, appears well and in no acute distress. HEENT: Atraumatic. NECK: Normal movements of the head and neck. CARDIOPULMONARY: No increased WOB. Speaking in clear sentences. I:E ratio WNL.  MS: Moves all visible extremities without noticeable abnormality. PSYCH: Pleasant and cooperative, well-groomed. Speech normal rate and rhythm. Affect is appropriate. Insight and judgement are appropriate. Attention is focused, linear, and appropriate.  NEURO: CN grossly intact. Oriented as arrived to appointment on time with no prompting. Moves both UE equally.  SKIN: No obvious lesions, wounds, erythema, or cyanosis noted on face or hands.   Assessment and Plan:    ICD-10-CM   1. Right inguinal pain  R10.31        Follow Up Instructions: Instructed patient to come to the urgent care or go to the emergency department for evaluation of her symptoms.  Patient agrees to plan of care.      I discussed the assessment and treatment plan with the patient. The patient was provided an opportunity to ask questions and all were answered. The patient agreed with the plan and demonstrated an understanding of the instructions.   The patient was advised to call back or seek an in-person evaluation if the symptoms worsen or if the condition fails to improve as anticipated.  Sharion Balloon, NP  07/23/2019 5:44 PM         Sharion Balloon, NP 07/23/19 1744

## 2019-07-27 ENCOUNTER — Telehealth: Payer: Self-pay | Admitting: Obstetrics and Gynecology

## 2019-07-27 NOTE — Telephone Encounter (Signed)
Left message for pt to call back and speak to triage RN.

## 2019-07-27 NOTE — Telephone Encounter (Signed)
Appointment Request From: Valerie Roys    With Provider: Romualdo Bolk, MD [facility]]    Preferred Date Range: 07/30/2019 - 07/30/2019    Preferred Times: Any Time    Reason for visit: Office Visit    Comments:  I am having left groin pain in the same place where I had a ovarian cyst rupture for a couple of days now and I would like to see if a ultrasound could be done to see if everything is ok

## 2019-07-28 NOTE — Telephone Encounter (Signed)
Spoke to pt. Pt states having left groin pain where she had an ovarian cyst rupture hx and has this current pain that started on 2/5. Pt states feels better today when calling to office. Pt also states took ovulation test during this episode and it was "high' on test. Used heating pad and took Ibuprofen and the pain eased off and went away by today. Pt states has been going on for a couple of cycles now. Pt declines OV at this time. Pt has AEX with another practice on 08/27/2019.  Routing to Dr Oscar La for review and will close encounter.

## 2019-07-30 ENCOUNTER — Encounter: Payer: Self-pay | Admitting: Obstetrics and Gynecology

## 2019-07-30 ENCOUNTER — Telehealth: Payer: Self-pay | Admitting: Obstetrics and Gynecology

## 2019-07-30 ENCOUNTER — Other Ambulatory Visit: Payer: Self-pay

## 2019-07-30 ENCOUNTER — Ambulatory Visit (INDEPENDENT_AMBULATORY_CARE_PROVIDER_SITE_OTHER): Payer: No Typology Code available for payment source

## 2019-07-30 ENCOUNTER — Ambulatory Visit (INDEPENDENT_AMBULATORY_CARE_PROVIDER_SITE_OTHER): Payer: No Typology Code available for payment source | Admitting: Obstetrics and Gynecology

## 2019-07-30 VITALS — BP 100/60 | HR 70 | Temp 98.1°F | Ht <= 58 in | Wt 111.8 lb

## 2019-07-30 DIAGNOSIS — N9489 Other specified conditions associated with female genital organs and menstrual cycle: Secondary | ICD-10-CM | POA: Diagnosis not present

## 2019-07-30 DIAGNOSIS — N83202 Unspecified ovarian cyst, left side: Secondary | ICD-10-CM

## 2019-07-30 DIAGNOSIS — R109 Unspecified abdominal pain: Secondary | ICD-10-CM

## 2019-07-30 DIAGNOSIS — R102 Pelvic and perineal pain: Secondary | ICD-10-CM

## 2019-07-30 LAB — POCT URINALYSIS DIPSTICK
Bilirubin, UA: NEGATIVE
Blood, UA: NEGATIVE
Glucose, UA: NEGATIVE
Ketones, UA: NEGATIVE
Leukocytes, UA: NEGATIVE
Protein, UA: NEGATIVE
Urobilinogen, UA: 0.2 E.U./dL
pH, UA: 7 (ref 5.0–8.0)

## 2019-07-30 NOTE — Telephone Encounter (Signed)
Spoke with patient. Patient reports left groin pain and flank pain for approximately 1 wk. Had a [facility] UC video visit on 2/4, called office on 2/8. Patient reports she was feeling better on 2/8, symptoms returned last night. Patient reports pain 7/10, heating pad helps. Is also taking ibuprofen prn. Denies urinary symptoms, fever/chills, N/V, vag odor or d/c. Patient reports Hx of ovarian cyst. Covid19 prescreen negative, precautions reviewed. Last AEX 11/2018. Patient has a new pt appt 08/27/19 with new gyn, wants to "try out same provider that her wife sees, unsure if she will transfer". Patient unable to be seen until March.   Patient request first available appt. OV scheduled for today at 2pm with Dr. Edward Jolly. Patient verbalizes understanding and is agreeable.   Reviewed with Dr. Oscar La, agreeable to plan.   Routing to provider for final review. Patient is agreeable to disposition. Will close encounter.  Cc: Dr. Edward Jolly

## 2019-07-30 NOTE — Telephone Encounter (Signed)
Please schedule a recheck appointment with me for 6 weeks from now.  She has a cyst and needs follow up. Patient left without scheduling.

## 2019-07-30 NOTE — Progress Notes (Signed)
GYNECOLOGY  VISIT   HPI: 24 y.o.   Married  [place] American  female   [place] with Patient's last menstrual period was 06/29/2019 (exact date).   here for left groin pain and left flank pain x 1week. Sudden onset of pain.  Pain is sharp and now became aching and radiating in to her left back. Movement makes it worse.  Lying down and putting heat on the area helps.  NSAIDs are not so helpful.  Feels like her pain is getting better.   Patient denies any urinary symptoms. Denies painful urination, blood in urine, UTIs.   Patient had pain like this in May, 2020.  She had an US showing fluid near her ovary and told she had a ruptured ovarian cyst.   Menses are now spontaneous but there are late per patient.  They occur about every 40 days.  She is checking her ovulation and she is ovulating by [facility] testing.  She has lost about 10 pounds intentionally.  Patient will have her attempt for pregnancy in about 2 years when her donor returns to the country.   Her spouse is pregnant with twins at 35 weeks.  Mother passed in Dec.   Urine dip - neg  GYNECOLOGIC HISTORY: Patient's last menstrual period was 06/29/2019 (exact date). Contraception: None--female partner Menopausal hormone therapy:  n/a Last mammogram: n/a Last pap smear: 06-26-17 Neg        OB History    Gravida  0   Para  0   Term  0   Preterm  0   AB  0   Living  0     SAB  0   TAB  0   Ectopic  0   Multiple  0   Live Births  0              Patient Active Problem List   Diagnosis Date Noted  . Sore throat 01/28/2019  . Odynophagia 01/28/2019  . Neck pain 01/28/2019  . Thyroiditis 01/12/2019  . 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/2018  . Fatty liver 12/11/2018  . Overweight (BMI 25.0-29.9) 11/12/2018  . PCOS (polycystic ovarian syndrome) 11/11/2018  . Seasonal allergies 09/22/2018  . Mild intermittent asthma with (acute) exacerbation 09/01/2018  . 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 Migraine with aura and without status  migrainosus, not intractable 09/11/2012  . Variants of migraine, not elsewhere classified, without mention of intractable migraine without mention of status migrainosus 09/11/2012  . Unspecified constipation 09/11/2012  . Syncope 08/31/2010  . Neck Pain 08/31/2010  . Insomnia 08/31/2010    Past Medical History:  Diagnosis Date  . Allergic rhinitis   . Asthma   . Eczema   . Environmental allergies    Age 32 years  . Fatty liver 12/11/2018   Noted on CT 6/20  . Generalized headaches    Began at age 8 years  . Influenza    Age 32 years  . 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 Streptococcal infection(041.00)    Age 67 and 24 years old  . 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 TONSILLECTOMY  2012  . WISDOM TOOTH EXTRACTION      Current Outpatient Medications  Medication Sig Dispense Refill  . albuterol (VENTOLIN HFA) 108 (90 Base) MCG/ACT inhaler Inhale 2 puffs into the lungs every 6 (six) hours as needed for wheezing or shortness of breath. 18 g 5  .  levocetirizine (XYZAL) 5 MG tablet TAKE 1 TABLET BY MOUTH EVERY DAY IN THE EVENING 30 tablet 0  . omeprazole (PRILOSEC) 40 MG capsule Take 1 capsule (40 mg total) by mouth 2 (two) times daily. 60 capsule 3  . naproxen (NAPROSYN) 500 MG tablet Take 1 tablet (500 mg total) by mouth 2 (two) times daily. 30 tablet 0   No current facility-administered medications for this visit.     ALLERGIES: Patient has no known allergies.  Family History  Problem Relation Age of Onset  . Migraines Paternal Grandmother   . Migraines Paternal Grandfather   . HIV Father   . Diabetes Mother   . Hypertension Mother   . Anemia Mother   . COPD Mother   . CVA Mother   . Congestive Heart Failure Mother   . Cirrhosis Mother   . Obesity Mother   . Ulcers Brother   . Anemia Sister   . Cholecystitis Brother   . Migraines Paternal Aunt   . Migraines  Maternal Uncle        Maternal Great Uncle  . Diabetes Other        Family History  . Hypertension Other        Family History  . Depression Other        Family History  . Asthma Other        Family History  . Allergic rhinitis Other        Family History  . Asthma Brother     Social History   Socioeconomic History  . Marital status: Married    Spouse name: Not on file  . Number of children: 0  . Years of education: Not on file  . Highest education level: Associate degree: occupational, Scientist, product/process development, or vocational program  Occupational History  . Not on file  Tobacco Use  . Smoking status: Never Smoker  . Smokeless tobacco: Never Used  Substance and Sexual Activity  . Alcohol use: Not Currently  . Drug use: No  . Sexual activity: Yes    Partners: Female    Comment: female partner  Other Topics Concern  . 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    Social Determinants of Health   Financial Resource Strain:   . Difficulty of Paying Living Expenses: Not on file  Food Insecurity:   . Worried About Programme researcher, broadcasting/film/video in the Last Year: Not on file  . Ran Out of Food in the Last Year: Not on file  Transportation Needs:   . Lack of Transportation (Medical): Not on file  . Lack of Transportation (Non-Medical): Not on file  Physical Activity:   . Days of Exercise per Week: Not on file  . Minutes of Exercise per Session: Not on file  Stress:   . Feeling of Stress : Not on file  Social Connections:   . Frequency of Communication with Friends and Family: Not on file  . Frequency of Social Gatherings with Friends and Family: Not on file  . Attends Religious Services: Not on file  . Active Member of Clubs or Organizations: Not on file  . Attends Banker  Meetings: Not on file  . Marital Status: Not on file  Intimate Partner Violence:   . Fear of Current or Ex-Partner: Not on file  . Emotionally Abused: Not on file  . Physically Abused: Not on file  . Sexually Abused: Not on file    Review of Systems  Genitourinary: Positive for pelvic pain (LLQ--more left groin/left flank pain).  All other systems reviewed and are negative.   PHYSICAL EXAMINATION:    BP 100/60   Pulse 70   Temp 98.1 F (36.7 C) (Temporal)   Ht 4\' 9"  (1.448 m)   Wt 111 lb 12.8 oz (50.7 kg)   LMP 06/29/2019 (Exact Date)   BMI 24.19 kg/m     General appearance: alert, cooperative and appears stated age Head: Normocephalic, without obvious abnormality, atraumatic Lungs: clear to auscultation bilaterally Heart: regular rate and rhythm Abdomen: soft, non-tender, no masses,  no organomegaly Back:  No CVA tenderness. Extremities: extremities normal, atraumatic, no cyanosis or edema No abnormal inguinal nodes palpated Neurologic: Grossly normal  Pelvic: External genitalia:  no lesions              Urethra:  normal appearing urethra with no masses, tenderness or lesions              Bartholins and Skenes: normal                 Vagina: normal appearing vagina with normal color and discharge, no lesions              Cervix: no lesions                Bimanual Exam:  Uterus:  normal size, contour, position, consistency, mobility, non-tender              Adnexa: no mass, fullness, tenderness on right.  Tender mass on left.               Chaperone was present for exam.  ASSESSMENT  Left pelvic mass.  Hx migraine with aura.  Hx PCOS.  Currently ovulatory.  Hx thyromegaly.   PLAN  Plan for pelvic ultrasound now.  We discussed her return to ovulation as a reassuring sign of improved fertility.  We reviewed Depo Provera as a way to block ovulation and reduce risk of cyst formation.  This would need to be discontinued several months prior to trying for  fertility, however.  She declines this.  Prescription for NSAID also declined.  Patient will follow up for a recheck in 6 weeks, sooner as needed.  Will determine if needs ultrasound at that time.    An After Visit Summary was printed and given to the patient.  __30____ minutes face to face time of which over 50% was spent in counseling.

## 2019-07-30 NOTE — Telephone Encounter (Signed)
Patient is having "groin pain and thinks she may have another ovarian cyst".

## 2019-07-30 NOTE — Progress Notes (Signed)
Encounter reviewed by Dr. Janean Sark.  Pelvic US findings reviewed with patient: Uterus no masses.  EMS 8.75 mm. Left ovary with hemorrhagic cyst - 44 mm x 34 mm x 42 mm.  No abnormal blood flow.  Right ovary normal.  Moderate free fluid in bilateral adnexa.

## 2019-07-30 NOTE — Telephone Encounter (Signed)
Spoke with patient. OV scheduled for 09/10/19 at 8am with Dr. Edward Jolly.   Routing to provider for final review. Patient is agreeable to disposition. Will close encounter.

## 2019-07-30 NOTE — Patient Instructions (Signed)
Ovarian Cyst     An ovarian cyst is a fluid-filled sac that forms on an ovary. The ovaries are small organs that produce eggs in women. Various types of cysts can form on the ovaries. Some may cause symptoms and require treatment. Most ovarian cysts go away on their own, are not cancerous (are benign), and do not cause problems. Common types of ovarian cysts include:  Functional (follicle) cysts. ? Occur during the menstrual cycle, and usually go away with the next menstrual cycle if you do not get pregnant. ? Usually cause no symptoms.  Endometriomas. ? Are cysts that form from the tissue that lines the uterus (endometrium). ? Are sometimes called "chocolate cysts" because they become filled with blood that turns brown. ? Can cause pain in the lower abdomen during intercourse and during your period.  Cystadenoma cysts. ? Develop from cells on the outside surface of the ovary. ? Can get very large and cause lower abdomen pain and pain with intercourse. ? Can cause severe pain if they twist or break open (rupture).  Dermoid cysts. ? Are sometimes found in both ovaries. ? May contain different kinds of body tissue, such as skin, teeth, hair, or cartilage. ? Usually do not cause symptoms unless they get very big.  Theca lutein cysts. ? Occur when too much of a certain hormone (human chorionic gonadotropin) is produced and overstimulates the ovaries to produce an egg. ? Are most common after having procedures used to assist with the conception of a baby (in vitro fertilization). What are the causes? Ovarian cysts may be caused by:  Ovarian hyperstimulation syndrome. This is a condition that can develop from taking fertility medicines. It causes multiple large ovarian cysts to form.  Polycystic ovarian syndrome (PCOS). This is a common hormonal disorder that can cause ovarian cysts, as well as problems with your period or fertility. What increases the risk? The following factors may  make you more likely to develop ovarian cysts:  Being overweight or obese.  Taking fertility medicines.  Taking certain forms of hormonal birth control.  Smoking. What are the signs or symptoms? Many ovarian cysts do not cause symptoms. If symptoms are present, they may include:  Pelvic pain or pressure.  Pain in the lower abdomen.  Pain during sex.  Abdominal swelling.  Abnormal menstrual periods.  Increasing pain with menstrual periods. How is this diagnosed? These cysts are commonly found during a routine pelvic exam. You may have tests to find out more about the cyst, such as:  Ultrasound.  X-ray of the pelvis.  CT scan.  MRI.  Blood tests. How is this treated? Many ovarian cysts go away on their own without treatment. Your health care provider may want to check your cyst regularly for 2-3 months to see if it changes. If you are in menopause, it is especially important to have your cyst monitored closely because menopausal women have a higher rate of ovarian cancer. When treatment is needed, it may include:  Medicines to help relieve pain.  A procedure to drain the cyst (aspiration).  Surgery to remove the whole cyst.  Hormone treatment or birth control pills. These methods are sometimes used to help dissolve a cyst. Follow these instructions at home:  Take over-the-counter and prescription medicines only as told by your health care provider.  Do not drive or use heavy machinery while taking prescription pain medicine.  Get regular pelvic exams and Pap tests as often as told by your health care provider.    Return to your normal activities as told by your health care provider. Ask your health care provider what activities are safe for you.  Do not use any products that contain nicotine or tobacco, such as cigarettes and e-cigarettes. If you need help quitting, ask your health care provider.  Keep all follow-up visits as told by your health care provider.  This is important. Contact a health care provider if:  Your periods are late, irregular, or painful, or they stop.  You have pelvic pain that does not go away.  You have pressure on your bladder or trouble emptying your bladder completely.  You have pain during sex.  You have any of the following in your abdomen: ? A feeling of fullness. ? Pressure. ? Discomfort. ? Pain that does not go away. ? Swelling.  You feel generally ill.  You become constipated.  You lose your appetite.  You develop severe acne.  You start to have more body hair and facial hair.  You are gaining weight or losing weight without changing your exercise and eating habits.  You think you may be pregnant. Get help right away if:  You have abdominal pain that is severe or gets worse.  You cannot eat or drink without vomiting.  You suddenly develop a fever.  Your menstrual period is much heavier than usual. This information is not intended to replace advice given to you by your health care provider. Make sure you discuss any questions you have with your health care provider. Document Revised: 09/02/2017 Document Reviewed: 11/06/2015 Elsevier Patient Education  2020 Elsevier Inc.  

## 2019-08-05 ENCOUNTER — Ambulatory Visit (INDEPENDENT_AMBULATORY_CARE_PROVIDER_SITE_OTHER): Payer: No Typology Code available for payment source | Admitting: Gastroenterology

## 2019-08-05 ENCOUNTER — Encounter: Payer: Self-pay | Admitting: Gastroenterology

## 2019-08-05 VITALS — BP 94/60 | HR 79 | Temp 98.4°F | Ht <= 58 in | Wt 112.2 lb

## 2019-08-05 DIAGNOSIS — K219 Gastro-esophageal reflux disease without esophagitis: Secondary | ICD-10-CM

## 2019-08-05 DIAGNOSIS — R0989 Other specified symptoms and signs involving the circulatory and respiratory systems: Secondary | ICD-10-CM

## 2019-08-05 DIAGNOSIS — R198 Other specified symptoms and signs involving the digestive system and abdomen: Secondary | ICD-10-CM

## 2019-08-05 MED ORDER — OMEPRAZOLE MAGNESIUM 20 MG PO TBEC: 40.0000 mg | DELAYED_RELEASE_TABLET | Freq: Every day | ORAL | 1 refills | Status: DC

## 2019-08-05 NOTE — Progress Notes (Signed)
Referring Provider: Deeann Saint, MD Primary Care Physician:  Deeann Saint, MD  Reason for Consultation:  Globus and reflux   IMPRESSION:  Globus GERD Left-sided throat pain Pill dysphagia Asthma, eczema, and allergies 10 pound weight loss since March Chronic constipation Psychosocial stress: She and her wife are expecting twins in August Hepatic steatosis seen on CT angio of the chest 12/10/2018  Globus without associated pain, dysphagia, odynophagia, weight loss, a change in voice, presence of a neck or tonsillar mass, or unexplained cervical adenopathy.  She reports some left-sided throat pain but was previously evaluated by ENT.  We will obtain their records for review.  EGD recommended to evaluate for gastroesophageal reflux, gastric inlet pouch in the proximal esophagus, and eosinophilic esophagitis as the cause of symptoms, particularly given her history of asthma, eczema, and allergies.  If upper endoscopy is negative would consider esophageal manometry, esophageal impedance, and pH testing to exclude esophageal motility disorders.  Trial of crushable PPI therapy now. Consider amitriptyline 25 mg daily if no response to PPI.   PLAN: Obtain records from ENT evaluation Omeprazole 40 mg BID for crushable form EGD  The nature of the procedure, as well as the risks, benefits, and alternatives were carefully and thoroughly reviewed with the patient. Ample time for discussion and questions allowed. The patient understood, was satisfied, and agreed to proceed.  Please see the "Patient Instructions" section for addition details about the plan.  HPI: Maria Smith is a 24 y.o. female referred by Dr. Salomon Fick for further evaluation of reflux.  She works for [employer]. She has asthma, eczema, allergies, migraines, and headaches.   The patient reports an excess of mucous in her throat since 08/2018. She has had multiple Urgent Care visits since that time for  the symptoms. She feels like she has trouble swallowing due to excess mucous. She finds that the mucous changes color - sometimes its brown, dark yellow, or even blood.  The color of the mucus has her very concerned.  She has had multiple chest x-rays so she does not feel that this is a pulmonary issue.  Not eating because she feels like she is choking.  Feels better once she clears her throat and the mucous comes out.  Nearly persistently sense of globus that is present between meals, although worse immediately after eating. Worsened by dairy and cold foods. Also stopped eating meat hoping this might improve her symptoms.  Rare heartburn. No regurgitation.  Some left sided throat pain that feels like an "achy, neck strain." No dysphagia, odynophagia, dysphonia. No nausea or vomiting. She has lost 10 pounds since last March. There is no history of radiation to the head neck, smoking, or alcohol consumption (stopped drinking in 2020).  No evaluation by pulmonary. Evaluation by ENT for the same symptoms over the last year. Had a laryngoscopy. She was told that she had an abrasion.  These records are not available for my review in EPIC.  Intermittent left sided abdominal pain but she doesn't think it's related.   She has longstanding history of constipation.  Doesn't find Miralax helpful.  Not taking omeprazole because of the difficulty swallowing.  She had a capsule.   Wants to get healthy. She and her partner are expecting twins 01/24/20.  Family history of "stomach issues" including ulcers, hiatal hernia in her Mom. No known family history of colon cancer or polyps. No family history of uterine/endometrial cancer, pancreatic cancer or gastric/stomach cancer.  CT angio  of the chest 12/10/2018: No acute findings.  Hepatic steatosis suggested. Chest x-ray 06/03/2019: No acute abnormalities  Past Medical History:  Diagnosis Date  . Allergic rhinitis   . Asthma   . Eczema   . Environmental allergies      Age 63 years  . Fatty liver 12/11/2018   Noted on CT 6/20  . Generalized headaches    Began at age 61 years  . Influenza    Age 87 years  . 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 Streptococcal infection(041.00)    Age 63 and 24 years old  . 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 TONSILLECTOMY  2012  . WISDOM TOOTH EXTRACTION      Current Outpatient Medications  Medication Sig Dispense Refill  . albuterol (VENTOLIN HFA) 108 (90 Base) MCG/ACT inhaler Inhale 2 puffs into the lungs every 6 (six) hours as needed for wheezing or shortness of breath. (Patient taking differently: Inhale 2 puffs into the lungs as needed for wheezing or shortness of breath. ) 18 g 5  . levocetirizine (XYZAL) 5 MG tablet TAKE 1 TABLET BY MOUTH EVERY DAY IN THE EVENING (Patient taking differently: as needed. ) 30 tablet 0  . omeprazole (PRILOSEC) 40 MG capsule Take 1 capsule (40 mg total) by mouth 2 (two) times daily. (Patient not taking: Reported on 08/05/2019) 60 capsule 3   No current facility-administered medications for this visit.    Allergies as of 08/05/2019  . (No Known Allergies)    Family History  Problem Relation Age of Onset  . Migraines Paternal Grandmother   . Migraines Paternal Grandfather   . HIV Father   . Diabetes Mother   . Hypertension Mother   . Anemia Mother   . COPD Mother   . CVA Mother   . Congestive Heart Failure Mother   . Cirrhosis Mother   . Obesity Mother   . Irritable bowel syndrome Mother   . Ulcers Brother   . Anemia Sister   . Cholecystitis Brother   . Migraines Paternal Aunt   . Migraines Maternal Uncle        Maternal Great Uncle  . Diabetes Other        Family History  . Hypertension Other        Family History  . Depression Other        Family History  . Asthma Other        Family History  . Allergic rhinitis Other        Family History  . Asthma Brother      Social History   Socioeconomic History  . Marital status: Married    Spouse name: Not on file  . Number of children: 0  . Years of education: Not on file  . Highest education level: Associate degree: occupational, Hotel manager, or vocational program  Occupational History  . Occupation: dietary  Tobacco Use  . Smoking status: Never Smoker  . Smokeless tobacco: Never Used  Substance and Sexual Activity  . Alcohol use: Not Currently  . Drug use: No  . Sexual activity: Yes    Partners: Female    Comment: female partner  Other Topics Concern  . 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    Social Determinants of Health   Financial Resource Strain:   . Difficulty of Paying Living Expenses: Not on file  Food Insecurity:   . Worried About Programme researcher, broadcasting/film/video in the Last Year: Not on file  . Ran Out of Food in the Last Year: Not on file  Transportation Needs:   . Lack of Transportation (Medical): Not on file  . Lack of Transportation (Non-Medical): Not on file  Physical Activity:   . Days of Exercise per Week: Not on file  . Minutes of Exercise per Session: Not on file  Stress:   . Feeling of Stress : Not on file  Social Connections:   . Frequency of Communication with Friends and Family: Not on file  . Frequency of Social Gatherings with Friends and Family: Not on file  . Attends Religious Services: Not on file  . Active Member of Clubs or Organizations: Not on file  . Attends Banker Meetings: Not on file  . Marital Status: Not on file  Intimate Partner Violence:   . Fear of Current or Ex-Partner: Not on file  . Emotionally Abused: Not on file  . Physically Abused: Not on file  . Sexually Abused: Not on file    Review of Systems: 12 system ROS is  negative except as noted above with the addition of allergies and headache.   Physical Exam: General:   Alert,  well-nourished, pleasant and cooperative in NAD Head:  Normocephalic and atraumatic. Eyes:  Sclera clear, no icterus.   Conjunctiva pink. Ears:  Normal auditory acuity. Nose:  No deformity, discharge,  or lesions. Mouth:  No deformity or lesions.   Neck:  Supple; no masses or thyromegaly. Lungs:  Clear throughout to auscultation.   No wheezes. Heart:  Regular rate and rhythm; no murmurs. Abdomen:  Soft, nontender, nondistended, normal bowel sounds, no rebound or guarding. No hepatosplenomegaly.   Rectal:  Deferred  Msk:  Symmetrical. No boney deformities LAD: No inguinal or umbilical LAD Extremities:  No clubbing or edema. Neurologic:  Alert and  oriented x4;  grossly nonfocal Skin:  Intact without significant lesions or rashes. Psych:  Alert and cooperative. Normal mood and affect.    Maria Smith. Orvan Falconer, MD, MPH 08/05/2019, 9:20 AM

## 2019-08-05 NOTE — Patient Instructions (Signed)
We have sent the following medications to your pharmacy for you to pick up at your convenience: Omeprazole 40 mg twice a day   You have been scheduled for an endoscopy. Please follow written instructions given to you at your visit today. If you use inhalers (even only as needed), please bring them with you on the day of your procedure.  I value your feedback and thank you for entrusting [place] with your care. If you get a [facility] patient survey, I would appreciate you taking the time to let us know about your experience today. Thank you!   Due to recent changes in healthcare laws, you may see the results of your imaging and laboratory studies on MyChart before your provider has had a chance to review them.  We understand that in some cases there may be results that are confusing or concerning to you. Not all laboratory results come back in the same time frame and the provider may be waiting for multiple results in order to interpret others.  Please give [place] 48 hours in order for your provider to thoroughly review all the results before contacting the office for clarification of your results.

## 2019-08-07 ENCOUNTER — Telehealth: Payer: Self-pay | Admitting: Gastroenterology

## 2019-08-07 NOTE — Telephone Encounter (Signed)
Spoke patient and she agrees to get tested at [facility] [address] for covid testing. She has concerns about sometimes her BP and pulse drop low and going under anesthesia she is worried that it will drop too low. I assured her that we will have her monitored during her procedure and the anesthesiologist will be in the room with her the entire procedure. She verbalized understanding.

## 2019-08-07 NOTE — Telephone Encounter (Signed)
Patient is returning your call.  

## 2019-08-10 ENCOUNTER — Telehealth (INDEPENDENT_AMBULATORY_CARE_PROVIDER_SITE_OTHER): Payer: No Typology Code available for payment source | Admitting: Family Medicine

## 2019-08-10 DIAGNOSIS — K219 Gastro-esophageal reflux disease without esophagitis: Secondary | ICD-10-CM | POA: Diagnosis not present

## 2019-08-10 DIAGNOSIS — J3489 Other specified disorders of nose and nasal sinuses: Secondary | ICD-10-CM | POA: Diagnosis not present

## 2019-08-10 NOTE — Progress Notes (Signed)
Virtual Visit via Video Note  I connected with Maria Smith on 08/10/19 at  9:00 AM EST by a video enabled telemedicine application 2/2 [id number] pandemic and verified that I am speaking with the correct person using two identifiers.  Location patient: home Location provider:work or home office Persons participating in the virtual visit: patient, provider  I discussed the limitations of evaluation and management by telemedicine and the availability of in person appointments. The patient expressed understanding and agreed to proceed.   HPI: Pt is a 24 yo with pmh sig fro asthma, eczema, allergies.    Taking PPI for acid reflux.  Seen by GI recently, scheduled to have EGD in March.  Endorses thick greenish/yellow mucus in throat and sensation of food getting stuck due to the mucus.  Drinking 6 bottles of water/day.  Was taking xyzal for allergies, but hasn't in a while.  Allergies triggered more in the Spring by dust and pollen.  Pt notes hesitancy about having anesthesia to have the EGD.  Pt concerned as her bp is low normal.  Pt no longer having frequent HAs/migrianes.  ROS: See pertinent positives and negatives per HPI.  Past Medical History:  Diagnosis Date  . Allergic rhinitis   . Asthma   . Eczema   . Environmental allergies    Age 64 years  . Fatty liver 12/11/2018   Noted on CT 6/20  . Generalized headaches    Began at age 66 years  . Influenza    Age 89 years  . 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 Streptococcal infection(041.00)    Age [age] years old  . 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 TONSILLECTOMY  2012  . WISDOM TOOTH EXTRACTION      Family History  Problem Relation Age of Onset  . Migraines Paternal Grandmother   . Migraines Paternal Grandfather   . HIV Father   . Diabetes Mother   . Hypertension Mother   . Anemia Mother   . COPD Mother   . CVA  Mother   . Congestive Heart Failure Mother   . Cirrhosis Mother   . Obesity Mother   . Irritable bowel syndrome Mother   . Ulcers Brother   . Anemia Sister   . Cholecystitis Brother   . Migraines Paternal Aunt   . Migraines Maternal Uncle        Maternal Great Uncle  . Diabetes Other        Family History  . Hypertension Other        Family History  . Depression Other        Family History  . Asthma Other        Family History  . Allergic rhinitis Other        Family History  . Asthma Brother      Current Outpatient Medications:  .  albuterol (VENTOLIN HFA) 108 (90 Base) MCG/ACT inhaler, Inhale 2 puffs into the lungs every 6 (six) hours as needed for wheezing or shortness of breath. (Patient taking differently: Inhale 2 puffs into the lungs as needed for wheezing or shortness of breath. ), Disp: 18 g, Rfl: 5 .  levocetirizine (XYZAL) 5 MG tablet, TAKE 1 TABLET BY MOUTH EVERY DAY IN THE EVENING (Patient taking differently: as needed. ), Disp: 30 tablet, Rfl: 0 .  omeprazole (PRILOSEC OTC) 20 MG tablet, Take 2  tablets (40 mg total) by mouth daily., Disp: 60 tablet, Rfl: 1 .  omeprazole (PRILOSEC) 40 MG capsule, Take 1 capsule (40 mg total) by mouth 2 (two) times daily. (Patient not taking: Reported on 08/05/2019), Disp: 60 capsule, Rfl: 3  EXAM:  VITALS per patient if applicable:  RR between 12-20 bpm  GENERAL: alert, oriented, appears well and in no acute distress  HEENT: atraumatic, conjunctiva clear, no obvious abnormalities on inspection of external nose and ears  NECK: normal movements of the head and neck  LUNGS: on inspection no signs of respiratory distress, breathing rate appears normal, no obvious gross SOB, gasping or wheezing  CV: no obvious cyanosis  MS: moves all visible extremities without noticeable abnormality  PSYCH/NEURO: pleasant and cooperative, no obvious depression or anxiety, speech and thought processing grossly intact  ASSESSMENT AND  PLAN:  Discussed the following assessment and plan:  Thick nasal mucus  -symptoms possibly 2/2 gerd, esophageal stricture, allergies -continue to increase po intake of water -continue PPI.  Restart allergy medication -continue f/u with GI for EGD  Acid reflux -continue PPI -continue f/u with GI for EGD   I discussed the assessment and treatment plan with the patient. The patient was provided an opportunity to ask questions and all were answered. The patient agreed with the plan and demonstrated an understanding of the instructions.   The patient was advised to call back or seek an in-person evaluation if the symptoms worsen or if the condition fails to improve as anticipated.  More than 50% of over 20 minutes spent in total face-to-face with the patient, counseling and/or coordinating care.    Deeann Saint, MD

## 2019-08-19 ENCOUNTER — Other Ambulatory Visit: Payer: Self-pay | Admitting: Gastroenterology

## 2019-08-21 ENCOUNTER — Emergency Department: Payer: No Typology Code available for payment source

## 2019-08-21 ENCOUNTER — Telehealth: Payer: Self-pay | Admitting: Family Medicine

## 2019-08-21 ENCOUNTER — Other Ambulatory Visit: Payer: Self-pay

## 2019-08-21 ENCOUNTER — Telehealth (INDEPENDENT_AMBULATORY_CARE_PROVIDER_SITE_OTHER): Payer: No Typology Code available for payment source | Admitting: Family Medicine

## 2019-08-21 ENCOUNTER — Emergency Department
Admission: EM | Admit: 2019-08-21 | Discharge: 2019-08-21 | Disposition: A | Discharge status: Home / Self Care | Payer: No Typology Code available for payment source | Attending: Emergency Medicine | Admitting: Emergency Medicine

## 2019-08-21 ENCOUNTER — Encounter: Payer: Self-pay | Admitting: Emergency Medicine

## 2019-08-21 ENCOUNTER — Telehealth: Payer: Self-pay

## 2019-08-21 DIAGNOSIS — Z8611 Personal history of tuberculosis: Secondary | ICD-10-CM | POA: Insufficient documentation

## 2019-08-21 DIAGNOSIS — Z20822 Contact with and (suspected) exposure to covid-19: Secondary | ICD-10-CM | POA: Diagnosis not present

## 2019-08-21 DIAGNOSIS — J40 Bronchitis, not specified as acute or chronic: Secondary | ICD-10-CM | POA: Insufficient documentation

## 2019-08-21 DIAGNOSIS — R05 Cough: Secondary | ICD-10-CM | POA: Diagnosis present

## 2019-08-21 DIAGNOSIS — R0602 Shortness of breath: Secondary | ICD-10-CM

## 2019-08-21 HISTORY — DX: Latent tuberculosis: Z22.7

## 2019-08-21 LAB — BASIC METABOLIC PANEL
Anion gap: 4 — ABNORMAL LOW (ref 5–15)
BUN: 9 mg/dL (ref 6–20)
CO2: 28 mmol/L (ref 22–32)
Calcium: 9.4 mg/dL (ref 8.9–10.3)
Chloride: 106 mmol/L (ref 98–111)
Creatinine, Ser: 0.72 mg/dL (ref 0.44–1.00)
GFR calc Af Amer: >60 mL/min (ref >60)
GFR calc non Af Amer: >60 mL/min (ref >60)
Glucose, Bld: 92 mg/dL (ref 70–99)
Potassium: 3.9 mmol/L (ref 3.5–5.1)
Sodium: 138 mmol/L (ref 135–145)

## 2019-08-21 LAB — CBC
HCT: 38 % (ref 36.0–46.0)
Hemoglobin: 12.3 g/dL (ref 12.0–15.0)
MCH: 28.5 pg (ref 26.0–34.0)
MCHC: 32.4 g/dL (ref 30.0–36.0)
MCV: 88 fL (ref 80.0–100.0)
Platelets: 276 10*3/uL (ref 150–400)
RBC: 4.32 MIL/uL (ref 3.87–5.11)
RDW: 11.9 % (ref 11.5–15.5)
WBC: 4.3 10*3/uL (ref 4.0–10.5)
nRBC: 0 % (ref 0.0–0.2)

## 2019-08-21 LAB — POCT PREGNANCY, URINE: Preg Test, Ur: NEGATIVE

## 2019-08-21 LAB — POC SARS CORONAVIRUS 2 AG: SARS Coronavirus 2 Ag: NEGATIVE

## 2019-08-21 MED ORDER — PREDNISONE 50 MG PO TABS: 50.0000 mg | ORAL_TABLET | Freq: Every day | ORAL | 0 refills | Status: DC

## 2019-08-21 MED ORDER — IPRATROPIUM-ALBUTEROL 0.5-2.5 (3) MG/3ML IN SOLN
3.0000 mL | Freq: Once | RESPIRATORY_TRACT | Status: AC
  Administered 2019-08-21: 3 mL via RESPIRATORY_TRACT
  Filled 2019-08-21: qty 3

## 2019-08-21 MED ORDER — IOHEXOL 350 MG/ML SOLN
75.0000 mL | Freq: Once | INTRAVENOUS | Status: AC | PRN
  Administered 2019-08-21: 75 mL via INTRAVENOUS

## 2019-08-21 MED ORDER — ACETAMINOPHEN 325 MG PO TABS
650.0000 mg | ORAL_TABLET | Freq: Once | ORAL | Status: AC
  Administered 2019-08-21: 650 mg via ORAL
  Filled 2019-08-21: qty 2

## 2019-08-21 NOTE — ED Notes (Addendum)
Pt back to room. Updated on plan.

## 2019-08-21 NOTE — Progress Notes (Signed)
Virtual Visit via Video Note  I connected with Maria Smith on 08/21/19 at  9:30 AM EST by a video enabled telemedicine application 2/2 COVID-19 pandemic and verified that I am speaking with the correct person using two identifiers.  Location patient: home Location provider:work or home office Persons participating in the virtual visit: patient, provider  I discussed the limitations of evaluation and management by telemedicine and the availability of in person appointments. The patient expressed understanding and agreed to proceed.   HPI: Pt is a 24 yo female with pmh sig for allergies, asthma, eczema, GERD, anxiety seen for acute concern.  Pt states she forgot to mention a h/o latent TB in 2017.  Seen by HD, did not finish Rifampin, had 10-14 pills left.   Pt endorses SOB, intermittent achy sub sternal chest pain, intermittent cough, HAs, blood tinged mucus.  Pt endorses some anxiety.  Has a general unwell felling.   Denies fever, night sweats, chills, n/v, sick contacts.  Pt endorses negative COVID test this wk.  EGD scheduled for Monday.   ROS: See pertinent positives and negatives per HPI.  Past Medical History:  Diagnosis Date  . Allergic rhinitis   . Asthma   . Eczema   . Environmental allergies    Age 15 years  . Fatty liver 12/11/2018   Noted on CT 6/20  . Generalized headaches    Began at age 4 years  . Influenza    Age 80 years  . 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 Streptococcal infection(041.00)    Age 67 and 24 years old  . 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 TONSILLECTOMY  2012  . WISDOM TOOTH EXTRACTION      Family History  Problem Relation Age of Onset  . Migraines Paternal Grandmother   . Migraines Paternal Grandfather   . HIV Father   . Diabetes Mother   . Hypertension Mother   . Anemia Mother   . COPD Mother   . CVA Mother   . Congestive  Heart Failure Mother   . Cirrhosis Mother   . Obesity Mother   . Irritable bowel syndrome Mother   . Ulcers Brother   . Anemia Sister   . Cholecystitis Brother   . Migraines Paternal Aunt   . Migraines Maternal Uncle        Maternal Great Uncle  . Diabetes Other        Family History  . Hypertension Other        Family History  . Depression Other        Family History  . Asthma Other        Family History  . Allergic rhinitis Other        Family History  . Asthma Brother      Current Outpatient Medications:  .  albuterol (VENTOLIN HFA) 108 (90 Base) MCG/ACT inhaler, Inhale 2 puffs into the lungs every 6 (six) hours as needed for wheezing or shortness of breath. (Patient taking differently: Inhale 2 puffs into the lungs as needed for wheezing or shortness of breath. ), Disp: 18 g, Rfl: 5 .  levocetirizine (XYZAL) 5 MG tablet, TAKE 1 TABLET BY MOUTH EVERY DAY IN THE EVENING (Patient taking differently: as needed. ), Disp: 30 tablet, Rfl: 0 .  omeprazole (PRILOSEC) 20 MG capsule, TAKE 2 TABLETS BY MOUTH EVERY DAY, Disp: 180 capsule,  Rfl: 1 .  omeprazole (PRILOSEC) 40 MG capsule, Take 1 capsule (40 mg total) by mouth 2 (two) times daily. (Patient not taking: Reported on 08/05/2019), Disp: 60 capsule, Rfl: 3  EXAM:  VITALS per patient if applicable:    GENERAL: alert, oriented, appears anxious, mild SOB with conversation but not toxic  HEENT: atraumatic, conjunctiva clear, no obvious abnormalities on inspection of external nose and ears  NECK: normal movements of the head and neck  LUNGS: on inspection no signs of respiratory distress, breathing rate appears normal, no obvious gross SOB, gasping or wheezing  CV: no obvious cyanosis  MS: moves all visible extremities without noticeable abnormality  PSYCH/NEURO: pleasant and cooperative, no obvious depression or anxiety, speech and thought processing grossly intact  ASSESSMENT AND PLAN:  Discussed the following assessment  and plan:  SOB (shortness of breath)  History of TB (tuberculosis) -positive PPD.  Xray 05/2016 negative -started on Rifampin but did not complete 4 month course.  -Given current symptoms pt advised to proceed to the ED for further eval.     I discussed the assessment and treatment plan with the patient. The patient was provided an opportunity to ask questions and all were answered. The patient agreed with the plan and demonstrated an understanding of the instructions.   The patient was advised to call back or seek an in-person evaluation if the symptoms worsen or if the condition fails to improve as anticipated.   Billie Ruddy, MD

## 2019-08-21 NOTE — ED Notes (Signed)
Blue tube sent with lav/grn tubes.

## 2019-08-21 NOTE — ED Provider Notes (Signed)
[facility] Emergency Department Provider Note   ____________________________________________    I have reviewed the triage vital signs and the nursing notes.   HISTORY  Chief Complaint Shortness of Breath     HPI Maria Smith is a 24 y.o. female who presents with complaints of productive cough, shortness of breath worse with exertion over the last 6 months.  Patient states that occasionally she coughs up sputum with small amount of blood in it as well.  She occasionally feels that her chest is tight when she is coughing.  Does have a distant history of asthma.  No fevers or chills reported.  Did have a Covid exposure recently.  Past Medical History:  Diagnosis Date  . Allergic rhinitis   . Asthma   . Eczema   . Environmental allergies    Age 40 years  . Fatty liver 12/11/2018   Noted on CT 6/20  . Generalized headaches    Began at age 69 years  . Influenza    Age 76 years  . 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 Streptococcal infection(041.00)    Age 70 and 24 years old  . TB lung, latent    2017  . 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/2018   Diffuse enlargement on [place] 6/20    Patient Active Problem List   Diagnosis Date Noted  . History of TB (tuberculosis) 08/21/2019  . Sore throat 01/28/2019  . Odynophagia 01/28/2019  . Neck pain 01/28/2019  . Thyroiditis 01/12/2019  . 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/2018  . Fatty liver 12/11/2018  . Overweight (BMI 25.0-29.9) 11/12/2018  . PCOS (polycystic ovarian syndrome) 11/11/2018  . Seasonal allergies 09/22/2018  . Mild intermittent asthma with (acute) exacerbation 09/01/2018  . 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 Migraine with aura and without status migrainosus, not intractable 09/11/2012  . Variants of migraine, not elsewhere classified, without mention of intractable migraine without mention of status migrainosus 09/11/2012  . Unspecified constipation 09/11/2012  . Syncope 08/31/2010  . Neck Pain  08/31/2010  . Insomnia 08/31/2010    Past Surgical History:  Procedure Laterality Date  . TONSILLECTOMY  2012  . WISDOM TOOTH EXTRACTION      Prior to Admission medications   Medication Sig Start Date End Date Taking? Authorizing Provider  albuterol (VENTOLIN HFA) 108 (90 Base) MCG/ACT inhaler Inhale 2 puffs into the lungs every 6 (six) hours as needed for wheezing or shortness of breath. Patient taking differently: Inhale 2 puffs into the lungs as needed for wheezing or shortness of breath.  06/03/19   Deeann Saint, MD  levocetirizine (XYZAL) 5 MG tablet TAKE 1 TABLET BY MOUTH EVERY DAY IN THE EVENING Patient taking differently: as needed.  04/15/19   Deeann Saint, MD  omeprazole (PRILOSEC) 20 MG capsule TAKE 2 TABLETS BY MOUTH EVERY DAY 08/19/19   Tressia Danas, MD  omeprazole (PRILOSEC) 40 MG capsule Take 1 capsule (40 mg total) by mouth 2 (two) times daily. Patient not taking: Reported on 08/05/2019 07/03/19   Deeann Saint, MD     Allergies Patient has no known allergies.  Family History  Problem Relation Age of Onset  . Migraines Paternal Grandmother   . Migraines Paternal Grandfather   . HIV Father   . Diabetes Mother   . Hypertension Mother   . Anemia Mother   . COPD Mother   . CVA Mother   . Congestive Heart Failure Mother   . Cirrhosis Mother   .  Obesity Mother   . Irritable bowel syndrome Mother   . Ulcers Brother   . Anemia Sister   . Cholecystitis Brother   . Migraines Paternal Aunt   . Migraines Maternal Uncle        Maternal Great Uncle  . Diabetes Other        Family History  . Hypertension Other        Family History  . Depression Other        Family History  . Asthma Other        Family History  . Allergic rhinitis Other        Family History  . Asthma Brother     Social History Social History   Tobacco Use  . Smoking status: Never Smoker  . Smokeless tobacco: Never Used  Substance Use Topics  . Alcohol use: Not Currently    . Drug use: No    Review of Systems  Constitutional: No fever/chills Eyes: No visual changes.  ENT: No sore throat. Cardiovascular: As above Respiratory: As above Gastrointestinal: No abdominal pain.  No nausea, no vomiting.   Genitourinary: Negative for dysuria. Musculoskeletal: Negative for back pain. Skin: Negative for rash. Neurological: Negative for headaches or weakness   ____________________________________________   PHYSICAL EXAM:  VITAL SIGNS: ED Triage Vitals  Enc Vitals Group     BP 08/21/19 1105 126/74     Pulse Rate 08/21/19 1107 67     Resp 08/21/19 1107 16     Temp 08/21/19 1107 99.2 F (37.3 C)     Temp Source 08/21/19 1107 Oral     SpO2 08/21/19 1107 99 %     Weight 08/21/19 1059 50.3 kg (111 lb)     Height 08/21/19 1059 1.422 m (4\' 8" )     Head Circumference --      Peak Flow --      Pain Score 08/21/19 1058 7     Pain Loc --      Pain Edu? --      Excl. in GC? --     Constitutional: Alert and oriented.  Nose: No congestion/rhinnorhea. Mouth/Throat: Mucous membranes are moist.   Neck:  Painless ROM Cardiovascular: Normal rate, regular rhythm. Good peripheral circulation. Respiratory: Normal respiratory effort.  No retractions. Lungs CTAB. Gastrointestinal: Soft and nontender. No distention.  No CVA tenderness.  Musculoskeletal: No lower extremity tenderness nor edema.  Warm and well perfused Neurologic:  Normal speech and language. No gross focal neurologic deficits are appreciated.  Skin:  Skin is warm, dry and intact. No rash noted. Psychiatric: Mood and affect are normal. Speech and behavior are normal.  ____________________________________________   LABS (all labs ordered are listed, but only abnormal results are displayed)  Labs Reviewed  BASIC METABOLIC PANEL - Abnormal; Notable for the following components:      Result Value   Anion gap 4 (*)    All other components within normal limits  CBC  POC URINE PREG, ED  POCT  PREGNANCY, URINE  POC SARS CORONAVIRUS 2 AG -  ED   ____________________________________________  EKG  ED ECG REPORT I, 10/21/19, the attending physician, personally viewed and interpreted this ECG.  Date: 08/21/2019  Rhythm: normal sinus rhythm QRS Axis: normal Intervals: normal ST/T Wave abnormalities: normal Narrative Interpretation: no evidence of acute ischemia  ____________________________________________  RADIOLOGY  Chest x-ray unremarkable ____________________________________________   PROCEDURES  Procedure(s) performed: No  Procedures   Critical Care performed: No ____________________________________________   INITIAL IMPRESSION /  ASSESSMENT AND PLAN / ED COURSE  Pertinent labs & imaging results that were available during my care of the patient were reviewed by me and considered in my medical decision making (see chart for details).  Patient well-appearing in no acute distress, vital signs are reassuring.  Lab work is unremarkable.  Chest x-ray is benign.  Sent for CT angiography given 6 months of shortness of breath as well as reports of bloody sputum at times.  CT negative for PE.  No evidence of pneumonia or active TB given her history.  Treated with DuoNeb, will discharge with prednisone for suspected bronchospasm/bronchitis, will need close follow-up for further evaluation    ____________________________________________   FINAL CLINICAL IMPRESSION(S) / ED DIAGNOSES  Final diagnoses:  Bronchitis        Note:  This document was prepared using Dragon voice recognition software and may include unintentional dictation errors.   Lavonia Drafts, MD 08/21/19 1440

## 2019-08-21 NOTE — Telephone Encounter (Signed)
Pt called our office after her virtual visit this morning and wanted advised on what to do since her manager had called and told her that she had been exposed to COVID-19. Pt was advised to let the ED know about this so she could get tested for COVID-19 while at the ED.

## 2019-08-21 NOTE — ED Notes (Signed)
Pt up to bedside toilet in attempt to provide urine sample. Steady while ambulating.

## 2019-08-21 NOTE — Telephone Encounter (Signed)
Patients manager just let the patient know that the patient we exposed Tuesday.   I advised the patient to let the hospital know that she was exposed so that they can test her while she is there for her chest Xray.

## 2019-08-21 NOTE — ED Notes (Signed)
See triage note. Resp regular/unlabored. Pt reports SOB/dyspnea at times lately upon exertion. Airborne precautions in place. Attempted 20g IV at L ac.

## 2019-08-21 NOTE — Telephone Encounter (Signed)
FYI

## 2019-08-21 NOTE — Telephone Encounter (Signed)
Encounter is complete 

## 2019-08-21 NOTE — Telephone Encounter (Signed)
I am confused by the original message.  Please clarify.

## 2019-08-21 NOTE — ED Triage Notes (Signed)
Pt here for hemoptysis.  Hx of latent TB in 2017 so doctor wanted her seen.  Pt admits 10 lb weight loss in last couple months without trying.  No sweats or fevers. Pt reports her boss called her today and she had a covid exposure Tuesday this past week.  Pt reports has had bloody sputum for couple months; was told it was allergies.  Unlabored currently.  Also has chest tightness.  SHOB with exertion.

## 2019-08-21 NOTE — Discharge Instructions (Addendum)
Your coronavirus test was negative today.  Please take the steroids as prescribed

## 2019-08-21 NOTE — ED Notes (Signed)
Will hold on IV since EDP Kinner d/c labs. Will place if any labs or other imaging ordered later requires it.

## 2019-08-21 NOTE — ED Notes (Signed)
EDP Kinner further assessing and updating pt.

## 2019-08-21 NOTE — ED Notes (Signed)
Pt leaving for xray. Will place IV and collect labs once pt back.

## 2019-08-23 ENCOUNTER — Telehealth: Payer: Self-pay | Admitting: Gastroenterology

## 2019-08-23 NOTE — Telephone Encounter (Signed)
Pt called after noting a My Chart message about an appt tomorrow. Appt for [facility] previsit BP CK at [facility] is listed at 8:20am in Epic. Pt did not recall that appt and is aware of her EGD at [facility] on Wednesday. Asked patient to call the office after 8 am tomorrow to clarity appt and if necessary reschedule BP CK appt.

## 2019-08-24 ENCOUNTER — Other Ambulatory Visit (HOSPITAL_COMMUNITY)
Admission: RE | Admit: 2019-08-24 | Discharge: 2019-08-24 | Disposition: A | Discharge status: Home / Self Care | Payer: No Typology Code available for payment source | Source: Ambulatory Visit | Attending: Gastroenterology | Admitting: Gastroenterology

## 2019-08-24 ENCOUNTER — Ambulatory Visit (INDEPENDENT_AMBULATORY_CARE_PROVIDER_SITE_OTHER): Payer: No Typology Code available for payment source

## 2019-08-24 ENCOUNTER — Other Ambulatory Visit: Payer: Self-pay

## 2019-08-24 ENCOUNTER — Telehealth: Payer: Self-pay | Admitting: Gastroenterology

## 2019-08-24 DIAGNOSIS — Z20822 Contact with and (suspected) exposure to covid-19: Secondary | ICD-10-CM | POA: Insufficient documentation

## 2019-08-24 DIAGNOSIS — Z1159 Encounter for screening for other viral diseases: Secondary | ICD-10-CM

## 2019-08-24 DIAGNOSIS — Z01812 Encounter for preprocedural laboratory examination: Secondary | ICD-10-CM | POA: Insufficient documentation

## 2019-08-24 LAB — SARS CORONAVIRUS 2 (TAT 6-24 HRS): SARS Coronavirus 2: NEGATIVE

## 2019-08-24 NOTE — Telephone Encounter (Signed)
Thanks for your help.

## 2019-08-24 NOTE — Telephone Encounter (Signed)
Pt states she was placed on Prednisone 50mg  daily for 5 days on 08/21/19. Pt states she also runs a low BP and she wanted to check and make sure she would still be able to have the procedure since being placed on prednisone and the low BP. Please advise.

## 2019-08-24 NOTE — Telephone Encounter (Signed)
May proceed with endoscopy. Thanks.  

## 2019-08-24 NOTE — Telephone Encounter (Signed)
The pt has been notified and she will keep appt as planned

## 2019-08-26 ENCOUNTER — Other Ambulatory Visit: Payer: Self-pay

## 2019-08-26 ENCOUNTER — Encounter: Payer: Self-pay | Admitting: Gastroenterology

## 2019-08-26 ENCOUNTER — Ambulatory Visit (AMBULATORY_SURGERY_CENTER): Payer: No Typology Code available for payment source | Admitting: Gastroenterology

## 2019-08-26 VITALS — BP 135/82 | HR 71 | Temp 96.9°F | Resp 15 | Ht <= 58 in | Wt 112.0 lb

## 2019-08-26 DIAGNOSIS — K219 Gastro-esophageal reflux disease without esophagitis: Secondary | ICD-10-CM | POA: Diagnosis not present

## 2019-08-26 DIAGNOSIS — R0989 Other specified symptoms and signs involving the circulatory and respiratory systems: Secondary | ICD-10-CM

## 2019-08-26 DIAGNOSIS — R198 Other specified symptoms and signs involving the digestive system and abdomen: Secondary | ICD-10-CM

## 2019-08-26 DIAGNOSIS — K2950 Unspecified chronic gastritis without bleeding: Secondary | ICD-10-CM | POA: Diagnosis present

## 2019-08-26 MED ORDER — SODIUM CHLORIDE 0.9 % IV SOLN: 500.0000 mL | Freq: Once | INTRAVENOUS | Status: DC

## 2019-08-26 NOTE — Progress Notes (Signed)
Called to room to assist during endoscopic procedure.  Patient ID and intended procedure confirmed with present staff. Received instructions for my participation in the procedure from the performing physician.  

## 2019-08-26 NOTE — Patient Instructions (Signed)
Handout on gastritis given to you today  AVOID ALL ASPIRIN, ASPIRIN CONTAINING PRODUCTS (BC OR GOODY POWDERS) OR NSAIDS (IBUPROFEN, ADVIL, ALEVE, AND MOTRIN) ; TYLENOL IS OK TO TAKE Await pathology results     YOU HAD AN ENDOSCOPIC PROCEDURE TODAY AT THE [facility]:   Refer to the procedure report that was given to you for any specific questions about what was found during the examination.  If the procedure report does not answer your questions, please call your gastroenterologist to clarify.  If you requested that your care partner not be given the details of your procedure findings, then the procedure report has been included in a sealed envelope for you to review at your convenience later.  YOU SHOULD EXPECT: Some feelings of bloating in the abdomen. Passage of more gas than usual.  Walking can help get rid of the air that was put into your GI tract during the procedure and reduce the bloating. If you had a lower endoscopy (such as a colonoscopy or flexible sigmoidoscopy) you may notice spotting of blood in your stool or on the toilet paper. If you underwent a bowel prep for your procedure, you may not have a normal bowel movement for a few days.  Please Note:  You might notice some irritation and congestion in your nose or some drainage.  This is from the oxygen used during your procedure.  There is no need for concern and it should clear up in a day or so.  SYMPTOMS TO REPORT IMMEDIATELY:   Following upper endoscopy (EGD)  Vomiting of blood or coffee ground material  New chest pain or pain under the shoulder blades  Painful or persistently difficult swallowing  New shortness of breath  Fever of 100F or higher  Black, tarry-looking stools  For urgent or emergent issues, a gastroenterologist can be reached at any hour by calling [phone]. Do not use MyChart messaging for urgent concerns.    DIET:  We do recommend a small meal at first, but then you may proceed to  your regular diet.  Drink plenty of fluids but you should avoid alcoholic beverages for 24 hours.  ACTIVITY:  You should plan to take it easy for the rest of today and you should NOT DRIVE or use heavy machinery until tomorrow (because of the sedation medicines used during the test).    FOLLOW UP: Our staff will call the number listed on your records 48-72 hours following your procedure to check on you and address any questions or concerns that you may have regarding the information given to you following your procedure. If we do not reach you, we will leave a message.  We will attempt to reach you two times.  During this call, we will ask if you have developed any symptoms of COVID 19. If you develop any symptoms (ie: fever, flu-like symptoms, shortness of breath, cough etc.) before then, please call [phone].  If you test positive for Covid 19 in the 2 weeks post procedure, please call and report this information to [place].    If any biopsies were taken you will be contacted by phone or by letter within the next 1-3 weeks.  Please call us at [phone] if you have not heard about the biopsies in 3 weeks.    SIGNATURES/CONFIDENTIALITY: You and/or your care partner have signed paperwork which will be entered into your electronic medical record.  These signatures attest to the fact that that the information above on your After  Visit Summary has been reviewed and is understood.  Full responsibility of the confidentiality of this discharge information lies with you and/or your care-partner. 

## 2019-08-26 NOTE — Progress Notes (Signed)
TEMP-JB  V/S-KA 

## 2019-08-26 NOTE — Progress Notes (Signed)
Report given to PACU, vss 

## 2019-08-26 NOTE — Op Note (Signed)
[place] Patient Name: Maria Smith Procedure Date: 08/26/2019 1:58 PM MRN: [id number] Endoscopist: Thornton Park MD, MD Age: 24 Referring MD:  Date of Birth: [birth year] Gender: Female Account #: [id number] Procedure:                Upper GI endoscopy Indications:              Suspected esophageal reflux, Globus sensation                           Globus                           GERD                           Left-sided throat pain                           Pill dysphagia                           Asthma, eczema, and allergies                           10 pound weight loss since March Medicines:                Monitored Anesthesia Care Procedure:                Pre-Anesthesia Assessment:                           - Prior to the procedure, a History and Physical                            was performed, and patient medications and                            allergies were reviewed. The patient's tolerance of                            previous anesthesia was also reviewed. The risks                            and benefits of the procedure and the sedation                            options and risks were discussed with the patient.                            All questions were answered, and informed consent                            was obtained. Prior Anticoagulants: The patient has                            taken no previous anticoagulant or antiplatelet  agents. ASA Grade Assessment: II - A patient with                            mild systemic disease. After reviewing the risks                            and benefits, the patient was deemed in                            satisfactory condition to undergo the procedure.                           After obtaining informed consent, the endoscope was                            passed under direct vision. Throughout the                            procedure, the patient's blood  pressure, pulse, and                            oxygen saturations were monitored continuously. The                            Endoscope was introduced through the mouth, and                            advanced to the third part of duodenum. The upper                            GI endoscopy was accomplished without difficulty.                            The patient tolerated the procedure well. Scope In: Scope Out: Findings:                 The examined esophagus was normal. Biopsies were                            obtained from the proximal and distal esophagus                            with cold forceps for histology of suspected                            eosinophilic esophagitis.                           Patchy mild inflammation characterized by erosions,                            erythema, friability and granularity was found in                            the gastric  antrum. Biopsies were taken from the                            antrum, body, and fundus with a cold forceps for                            histology. Estimated blood loss was minimal.                           The examined duodenum was normal.                           The cardia and gastric fundus were normal on                            retroflexion.                           The exam was otherwise without abnormality. Complications:            No immediate complications. Estimated blood loss:                            Minimal. Estimated Blood Loss:     Estimated blood loss was minimal. Impression:               - Normal esophagus. Biopsied.                           - Gastritis. Biopsied.                           - Normal examined duodenum.                           - The examination was otherwise normal. Recommendation:           - Patient has a contact number available for                            emergencies. The signs and symptoms of potential                            delayed complications were  discussed with the                            patient. Return to normal activities tomorrow.                            Written discharge instructions were provided to the                            patient.                           - Resume previous diet.                           -  Continue present medications including omeprazole                            40 mg BID.                           - Await pathology results.                           - Avoid all NSAIDs                           - Follow-up with Dr. Orvan Falconer in 6-8 weeks. Tressia Danas MD, MD 08/26/2019 2:30:11 PM This report has been signed electronically.

## 2019-08-27 ENCOUNTER — Telehealth: Payer: Self-pay | Admitting: Gastroenterology

## 2019-08-27 ENCOUNTER — Ambulatory Visit: Payer: No Typology Code available for payment source | Admitting: Obstetrics & Gynecology

## 2019-08-27 ENCOUNTER — Institutional Professional Consult (permissible substitution): Payer: No Typology Code available for payment source | Admitting: Critical Care Medicine

## 2019-08-27 ENCOUNTER — Other Ambulatory Visit: Payer: Self-pay

## 2019-08-27 NOTE — Telephone Encounter (Signed)
Called patient back regarding "bad headache" since EGD yesterday. She states that it is somewhat better this am, 5/10 mostly frontal and improves with tylenol. Explained to her that this probably wasn't related to the anesthesia or procedure but encouraged her to push the fluids today and continue with the tylenol. Will make Dr. Orvan Falconer aware.

## 2019-08-28 ENCOUNTER — Ambulatory Visit: Payer: No Typology Code available for payment source | Attending: Internal Medicine

## 2019-08-28 ENCOUNTER — Telehealth: Payer: Self-pay | Admitting: *Deleted

## 2019-08-28 ENCOUNTER — Telehealth: Payer: Self-pay

## 2019-08-28 DIAGNOSIS — Z20822 Contact with and (suspected) exposure to covid-19: Secondary | ICD-10-CM

## 2019-08-28 NOTE — Telephone Encounter (Signed)
Message left

## 2019-08-28 NOTE — Telephone Encounter (Signed)
Would not expect this to be related to her procedure. I hope that she is feeling better today. I recommend that she reach out to her PCP if the headache persists. Thanks.

## 2019-08-28 NOTE — Telephone Encounter (Signed)
1st follow up call made.  NALM 

## 2019-08-29 LAB — NOVEL CORONAVIRUS, NAA: SARS-CoV-2, NAA: NOT DETECTED

## 2019-09-02 ENCOUNTER — Ambulatory Visit: Payer: No Typology Code available for payment source | Attending: Internal Medicine

## 2019-09-02 DIAGNOSIS — Z20822 Contact with and (suspected) exposure to covid-19: Secondary | ICD-10-CM

## 2019-09-03 LAB — NOVEL CORONAVIRUS, NAA: SARS-CoV-2, NAA: NOT DETECTED

## 2019-09-07 NOTE — Progress Notes (Deleted)
Virtual Visit via Video Note The purpose of this virtual visit is to provide medical care while limiting exposure to the novel coronavirus.    Consent was obtained for video visit:  Yes.   Answered questions that patient had about telehealth interaction:  Yes.   I discussed the limitations, risks, security and privacy concerns of performing an evaluation and management service by telemedicine. I also discussed with the patient that there may be a patient responsible charge related to this service. The patient expressed understanding and agreed to proceed.  Pt location: Home Physician Location: office Name of referring provider:  Billie Ruddy, MD I connected with Maria Smith at patients initiation/request on 09/09/2019 at  8:50 AM EDT by video enabled telemedicine application and verified that I am speaking with the correct person using two identifiers. Pt MRN:  [id number] Pt DOB:  1996-03-12 Video Participants:  Maria Smith   History of Present Illness:  Maria Smith is a 24 year old female who follows up for migraines.  UPDATE: Follows up with eye doctor? MRI of brain from 05/21/2019 was personally reviewed and was unremarkable.  Intensity:  *** Duration:  *** Frequency:  *** Frequency of abortive medication: *** Frequency of pain relievers:  3 days out of the week. Current NSAIDS:  Ibuprofen 600mg  Current analgesics:  Fioricet; Excedrin Current triptans:  rizatriptan 10mg  Current ergotamine:  none Current anti-emetic:  none Current muscle relaxants:  Flexeril Current anti-anxiolytic:  none Current sleep aide:  melatonin Current Antihypertensive medications:  none Current Antidepressant medications:  Sertraline 50mg  daily Current Anticonvulsant medications:  zonisamide 50mg  at bedtime Current anti-CGRP:  none Current Vitamins/Herbal/Supplements:  melatonin Current Antihistamines/Decongestants:  Xyzal Other therapy:   none Hormone/birth control:  none   Caffeine:  No coffee.  Rarely drinks tea.  No sodas Diet:  Drinks 4-5 bottles of water daily Exercise:  Not routine Depression:  no; Anxiety:  no.  However, she has been having to take on more responsibility since her mother was sick. Other pain:  Neck pain Sleep hygiene:  Poor.  Has tried melatonin.    HISTORY: Patient has history of migraines from age 89 until 53 and then they resolved.. They recurred 3 months ago, possibly related to stress.  She describes it as a 9/10 pressure like headache in a band-like distribution.  She has associated phonophobia but denies nausea, vomiting, photophobia or unilateral numbness or weakness.  In the past, she would see specks and blurriness in her vision, but that no longer occurs. They typically last a couple of hours but they have lasted a couple of days. She has had about 13 headache days over the past 4 weeks. She reports associated neck pain as well.  Increased neck pain aggravates it.  Strenuous activity or feeling excited causes pressure on top of her head.  Resting, laying in quiet place sometimes helps.  She is scheduled for an MRI of brain with and without contrast on 05/06/2019.  LABS 12/30/2018:  ANA negative; Sed Rate 4; CRP 1; RF negative   Past NSAIDS:  Ibuprofen Past analgesics:  Excedrin (may trigger her asthma) Past abortive triptans:  Sumatriptan tablet Past abortive ergotamine:  none Past muscle relaxants:  none Past anti-emetic:  none Past antihypertensive medications:  none Past antidepressant medications:  Amitriptyline 25mg  (1 year) Past anticonvulsant medications:  topiramate (1 year Past anti-CGRP:  none Past vitamins/Herbal/Supplements:  none Past antihistamines/decongestants:  none Other past therapies:  none   Family  history of headache:  Mother (also history of strokes), paternal aunt  Past Medical History: Past Medical History:  Diagnosis Date  . Allergic rhinitis    . Allergy    SEASONAL  . Asthma   . Eczema   . Environmental allergies    Age 59 years  . Fatty liver 12/11/2018   Noted on CT 6/20  . Generalized headaches    Began at age 40 years  . GERD (gastroesophageal reflux disease)   . Influenza    Age 39 years  . 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 Streptococcal infection(041.00)    Age 39 and 25 years old  . TB lung, latent    2017  . 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/2018   Diffuse enlargement on [place] 6/20    Medications: Outpatient Encounter Medications as of 09/09/2019  Medication Sig  . albuterol (VENTOLIN HFA) 108 (90 Base) MCG/ACT inhaler Inhale 2 puffs into the lungs every 6 (six) hours as needed for wheezing or shortness of breath. (Patient taking differently: Inhale 2 puffs into the lungs as needed for wheezing or shortness of breath. )  . levocetirizine (XYZAL) 5 MG tablet TAKE 1 TABLET BY MOUTH EVERY DAY IN THE EVENING (Patient taking differently: as needed. )  . omeprazole (PRILOSEC) 40 MG capsule Take 1 capsule (40 mg total) by mouth 2 (two) times daily.   No facility-administered encounter medications on file as of 09/09/2019.    Allergies: No Known Allergies  Family History: Family History  Problem Relation Age of Onset  . Migraines Paternal Grandmother   . Migraines Paternal Grandfather   . HIV Father   . Diabetes Mother   . Hypertension Mother   . Anemia Mother   . COPD Mother   . CVA Mother   . Congestive Heart Failure Mother   . Cirrhosis Mother   . Obesity Mother   . Irritable bowel syndrome Mother   . Ulcers Brother   . Anemia Sister   . Cholecystitis Brother   . Migraines Paternal Aunt   . Migraines Maternal Uncle        Maternal Great Uncle  . Diabetes Other        Family History  . Hypertension Other        Family History  . Depression Other        Family History  . Asthma Other        Family History  . Allergic rhinitis Other        Family History  . Asthma Brother   . Colon cancer  Neg Hx   . Colon polyps Neg Hx   . Esophageal cancer Neg Hx   . Rectal cancer Neg Hx   . Stomach cancer Neg Hx     Social History: Social History   Socioeconomic History  . Marital status: Married    Spouse name: Not on file  . Number of children: 0  . Years of education: Not on file  . Highest education level: Associate degree: occupational, Scientist, product/process development, or vocational program  Occupational History  . Occupation: dietary  Tobacco Use  . Smoking status: Never Smoker  . Smokeless tobacco: Never Used  Substance and Sexual Activity  . Alcohol use: Not Currently  . Drug use: No  . Sexual activity: Yes    Partners: Female    Comment: female partner  Other Topics Concern  . 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    Social Determinants of Health   Financial Resource Strain:   . Difficulty of Paying Living Expenses:   Food Insecurity:   . Worried About Programme researcher, broadcasting/film/video in the Last Year:   . Barista in the Last Year:   Transportation Needs:   . Freight forwarder (Medical):   Marland Kitchen Lack of Transportation (Non-Medical):   Physical Activity:   . Days of Exercise per Week:   . Minutes of Exercise per Session:   Stress:   . Feeling of Stress :   Social Connections:   . Frequency of Communication with Friends and Family:   . Frequency of Social Gatherings with Friends and Family:   . Attends Religious Services:   . Active Member of Clubs or Organizations:   . Attends Banker Meetings:   Marland Kitchen Marital Status:   Intimate Partner Violence:   . Fear of Current or Ex-Partner:   . Emotionally Abused:   Marland Kitchen Physically Abused:   . Sexually Abused:     Observations/Objective:   *** No acute distress.  Alert and oriented.  Speech fluent and not dysarthric.   Language intact.  Eyes orthophoric on primary gaze.  Face symmetric.  Assessment and Plan:   ***  Follow Up Instructions:    -I discussed the assessment and treatment plan with the patient. The patient was provided an opportunity to ask questions and all were answered. The patient agreed with the plan and demonstrated an understanding of the instructions.   The patient was advised to call back or seek an in-person evaluation if the symptoms worsen or if the condition fails to improve as anticipated.    Total Time spent in visit with the patient was:  ***, of which more than 50% of the time was spent in counseling and/or coordinating care on ***.   Pt understands and agrees with the plan of care outlined.     Cira Servant, DO

## 2019-09-08 NOTE — Progress Notes (Signed)
GYNECOLOGY  VISIT   HPI: 24 y.o.   Married  [place] American  female   [place] with Patient's last menstrual period was 08/07/2019 (exact date).   here for 6 week follow up.   She had left sided pain which prompted [place] on 07/30/19. US showed a left ovarian cyst - 44 x 34 x 42 mm, thick walls with internal echoes.  No further sharp pain.  Some cramping and feels like her period is going to start.  Cycles are about 40 days apart.   Patient complaining of "sores" on left breast. This is recurrent.  They are occurring more frequently and alternate sides.  Can be itchy.  There is skin breakdown per patient.  She was not sure if she felt a lateral breast lump last week.   She has eczema and uses Eucerin cream or Vaseline on the area.   Expecting twins for her partner, boy and a girl.   GYNECOLOGIC HISTORY: Patient's last menstrual period was 08/07/2019 (exact date). Contraception: None--female partner Menopausal hormone therapy:  n/a Last mammogram:  n/a Last pap smear:  06-26-17 Neg        OB History    Gravida  0   Para  0   Term  0   Preterm  0   AB  0   Living  0     SAB  0   TAB  0   Ectopic  0   Multiple  0   Live Births  0              Patient Active Problem List   Diagnosis Date Noted  . History of TB (tuberculosis) 08/21/2019  . Sore throat 01/28/2019  . Odynophagia 01/28/2019  . Neck pain 01/28/2019  . Thyroiditis 01/12/2019  . 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/2018  . Fatty liver 12/11/2018  . Overweight (BMI 25.0-29.9) 11/12/2018  . PCOS (polycystic ovarian syndrome) 11/11/2018  . Seasonal allergies 09/22/2018  . Mild intermittent asthma with (acute) exacerbation 09/01/2018  . 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 Migraine with aura and without status migrainosus, not intractable 09/11/2012  . Variants of migraine, not elsewhere classified, without mention of intractable migraine without mention of status migrainosus 09/11/2012  . Unspecified constipation 09/11/2012   . Syncope 08/31/2010  . Neck Pain 08/31/2010  . Insomnia 08/31/2010    Past Medical History:  Diagnosis Date  . Allergic rhinitis   . Allergy    SEASONAL  . Asthma   . Eczema   . Environmental allergies    Age 16 years  . Fatty liver 12/11/2018   Noted on CT 6/20  . Generalized headaches    Began at age 59 years  . GERD (gastroesophageal reflux disease)   . Influenza    Age 3 years  . 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 Streptococcal infection(041.00)    Age 40 and 24 years old  . TB lung, latent    2017  . 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 TONSILLECTOMY  2012  . WISDOM TOOTH EXTRACTION      Current Outpatient Medications  Medication Sig Dispense Refill  . albuterol (VENTOLIN HFA) 108 (90 Base) MCG/ACT inhaler Inhale 2 puffs into the lungs every 6 (six) hours as needed for wheezing or shortness of breath. (Patient taking differently: Inhale 2 puffs into the lungs as needed for wheezing or shortness of breath. ) 18 g  5  . levocetirizine (XYZAL) 5 MG tablet TAKE 1 TABLET BY MOUTH EVERY DAY IN THE EVENING (Patient taking differently: as needed. ) 30 tablet 0  . omeprazole (PRILOSEC) 40 MG capsule Take 1 capsule (40 mg total) by mouth 2 (two) times daily. 60 capsule 3   No current facility-administered medications for this visit.     ALLERGIES: Patient has no known allergies.  Family History  Problem Relation Age of Onset  . Migraines Paternal Grandmother   . Migraines Paternal Grandfather   . HIV Father   . Diabetes Mother   . Hypertension Mother   . Anemia Mother   . COPD Mother   . CVA Mother   . Congestive Heart Failure Mother   . Cirrhosis Mother   . Obesity Mother   . Irritable bowel syndrome Mother   . Ulcers Brother   . Anemia Sister   . Cholecystitis Brother   . Migraines Paternal Aunt   . Migraines Maternal Uncle        Maternal Great Uncle  .  Diabetes Other        Family History  . Hypertension Other        Family History  . Depression Other        Family History  . Asthma Other        Family History  . Allergic rhinitis Other        Family History  . Asthma Brother   . Colon cancer Neg Hx   . Colon polyps Neg Hx   . Esophageal cancer Neg Hx   . Rectal cancer Neg Hx   . Stomach cancer Neg Hx     Social History   Socioeconomic History  . Marital status: Married    Spouse name: Not on file  . Number of children: 0  . Years of education: Not on file  . Highest education level: Associate degree: occupational, Scientist, product/process development, or vocational program  Occupational History  . Occupation: dietary  Tobacco Use  . Smoking status: Never Smoker  . Smokeless tobacco: Never Used  Substance and Sexual Activity  . Alcohol use: Not Currently  . Drug use: No  . Sexual activity: Yes    Partners: Female    Comment: female partner  Other Topics Concern  . 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    Social Determinants of Health   Financial Resource Strain:   . Difficulty of Paying Living Expenses:   Food Insecurity:   . Worried About Programme researcher, broadcasting/film/video in the Last Year:   . Barista in the Last Year:   Transportation Needs:   . Freight forwarder (Medical):   Marland Kitchen Lack of Transportation (Non-Medical):   Physical Activity:   . Days of Exercise per Week:   . Minutes of Exercise per Session:   Stress:   . Feeling of Stress :   Social Connections:   . Frequency of Communication with Friends and Family:   . Frequency of Social Gatherings with Friends and Family:   . Attends Religious Services:   . Active Member of Clubs or Organizations:   . Attends Banker Meetings:   Marland Kitchen Marital Status:   Intimate  Partner Violence:   . Fear of Current or Ex-Partner:   . Emotionally Abused:   Marland Kitchen Physically Abused:   . Sexually Abused:     Review of Systems  Skin:       Sores on left breast  All other systems reviewed and are negative.   PHYSICAL EXAMINATION:    BP 100/66   Pulse 76   Temp (!) 97.1 F (36.2 C) (Temporal)   Ht 4\' 9"  (1.448 m)   Wt 109 lb (49.4 kg)   LMP 08/07/2019 (Exact Date)   BMI 23.59 kg/m     General appearance: alert, cooperative and appears stated age Breast:  No dominant masses, skin retractions, nipple discharge, or axillary adenopathy.  Left lateral breast tenderness.  Skin intact and without lesions.  Pelvic: External genitalia:  no lesions              Urethra:  normal appearing urethra with no masses, tenderness or lesions              Bartholins and Skenes: normal                 Vagina: normal appearing vagina with normal color and discharge, no lesions              Cervix: no lesions                Bimanual Exam:  Uterus:  normal size, contour, position, consistency, mobility, non-tender              Adnexa: no mass, fullness, tenderness Exam limited by patient's discomfort with exams.  Chaperone was present for exam.  ASSESSMENT  Left ovarian cyst.  LLQ tenderness with exam today limiting evaluation.  Skin pruritis of breast.   PLAN  We discussed ovarian cysts.  Return for pelvic 08/09/2019 - transabdominal (and transvaginal if possible).  OK for OTC hydrocortisone cream as needed to the breast.  Return for any breast mass.    An After Visit Summary was printed and given to the patient.  __20____ minutes face to face time of which over 50% was spent in counseling.

## 2019-09-09 ENCOUNTER — Other Ambulatory Visit: Payer: Self-pay

## 2019-09-09 ENCOUNTER — Telehealth: Payer: No Typology Code available for payment source | Admitting: Neurology

## 2019-09-09 ENCOUNTER — Encounter: Payer: No Typology Code available for payment source | Admitting: Medical

## 2019-09-09 ENCOUNTER — Ambulatory Visit: Payer: No Typology Code available for payment source | Attending: Internal Medicine

## 2019-09-09 DIAGNOSIS — Z20822 Contact with and (suspected) exposure to covid-19: Secondary | ICD-10-CM

## 2019-09-10 ENCOUNTER — Encounter: Payer: Self-pay | Admitting: Obstetrics and Gynecology

## 2019-09-10 ENCOUNTER — Ambulatory Visit (INDEPENDENT_AMBULATORY_CARE_PROVIDER_SITE_OTHER): Payer: No Typology Code available for payment source | Admitting: Obstetrics and Gynecology

## 2019-09-10 VITALS — BP 100/66 | HR 76 | Temp 97.1°F | Ht <= 58 in | Wt 109.0 lb

## 2019-09-10 DIAGNOSIS — L299 Pruritus, unspecified: Secondary | ICD-10-CM | POA: Diagnosis not present

## 2019-09-10 DIAGNOSIS — N83202 Unspecified ovarian cyst, left side: Secondary | ICD-10-CM | POA: Diagnosis not present

## 2019-09-10 LAB — SARS-COV-2, NAA 2 DAY TAT

## 2019-09-10 LAB — NOVEL CORONAVIRUS, NAA: SARS-CoV-2, NAA: NOT DETECTED

## 2019-09-11 ENCOUNTER — Telehealth: Payer: Self-pay | Admitting: Obstetrics and Gynecology

## 2019-09-11 NOTE — Telephone Encounter (Signed)
Call to patient. Per DPR, OK to leave message on voicemail.   Left voicemail requesting a return call to Hayley to review benefits and schedule recommended Pelvic ultrasound with Brook A. Silva, MD, FACOG 

## 2019-09-14 ENCOUNTER — Ambulatory Visit: Payer: No Typology Code available for payment source

## 2019-09-16 ENCOUNTER — Ambulatory Visit: Payer: No Typology Code available for payment source | Attending: Internal Medicine

## 2019-09-16 DIAGNOSIS — Z20822 Contact with and (suspected) exposure to covid-19: Secondary | ICD-10-CM

## 2019-09-17 LAB — NOVEL CORONAVIRUS, NAA: SARS-CoV-2, NAA: NOT DETECTED

## 2019-09-21 ENCOUNTER — Telehealth: Payer: Self-pay | Admitting: Obstetrics and Gynecology

## 2019-09-21 NOTE — Telephone Encounter (Signed)
Left message to call Artasia Thang at [phone]. 

## 2019-09-21 NOTE — Telephone Encounter (Signed)
Patient is scheduled for ultrasound on Thursday and period is on. Should she reschedule?

## 2019-09-22 ENCOUNTER — Telehealth: Payer: Self-pay

## 2019-09-22 NOTE — Telephone Encounter (Signed)
Patient called with concern regarding the Covid Vaccine. Patient is requesting testing for the covid vaccine.  Patient seems to only have environmental allergies per her last allergy testing.  Patient hasn't ever had a reaction to any injections.    Please Advise

## 2019-09-22 NOTE — Telephone Encounter (Signed)
Called and spoke with patient. She is just a bit nervous since the COVID vaccine is new. She has never had any reactions to any vaccines or medications or anything other than environmental factors in the past. No previous issues with Miralax or colon preps. I explained to patient that our Drs. Feel that it is safe for her to receive her vaccine. I did advice that she needs to stay to be observed afterwards and have if Epipen on hand just in case. Patient voiced understanding and feels better after speaking with me about it.

## 2019-09-23 ENCOUNTER — Encounter: Payer: No Typology Code available for payment source | Admitting: Obstetrics & Gynecology

## 2019-09-23 ENCOUNTER — Ambulatory Visit: Payer: No Typology Code available for payment source | Attending: Internal Medicine

## 2019-09-23 DIAGNOSIS — Z20822 Contact with and (suspected) exposure to covid-19: Secondary | ICD-10-CM

## 2019-09-23 NOTE — Telephone Encounter (Signed)
Spoke with pt. Pt states starting cycle on 09/20/19 with heaviest day being 09/22/19. Pt advised could keep PUS scheduled for 09/24/19. Pt declines keeping appt and wanting to move due to cycle.  PUS rescheduled for 10/01/2019 at 1100 am. Pt given instructions to drink 32 oz of water before appt and keep full bladder for transabdominal and transvaginal if possible PUS per Dr Rica Records OV on 09/10/19. Pt verbalized understanding.   Routing to Dr Edward Jolly for review.  Encounter closed.

## 2019-09-24 ENCOUNTER — Other Ambulatory Visit: Payer: No Typology Code available for payment source

## 2019-09-24 ENCOUNTER — Other Ambulatory Visit: Payer: No Typology Code available for payment source | Admitting: Obstetrics and Gynecology

## 2019-09-24 LAB — NOVEL CORONAVIRUS, NAA: SARS-CoV-2, NAA: NOT DETECTED

## 2019-09-24 LAB — SARS-COV-2, NAA 2 DAY TAT

## 2019-09-28 ENCOUNTER — Ambulatory Visit: Payer: No Typology Code available for payment source | Admitting: Gastroenterology

## 2019-09-30 ENCOUNTER — Ambulatory Visit: Payer: No Typology Code available for payment source | Attending: Internal Medicine

## 2019-09-30 DIAGNOSIS — Z20822 Contact with and (suspected) exposure to covid-19: Secondary | ICD-10-CM

## 2019-10-01 ENCOUNTER — Other Ambulatory Visit: Payer: No Typology Code available for payment source

## 2019-10-01 ENCOUNTER — Other Ambulatory Visit: Payer: No Typology Code available for payment source | Admitting: Obstetrics and Gynecology

## 2019-10-01 LAB — SARS-COV-2, NAA 2 DAY TAT

## 2019-10-01 LAB — NOVEL CORONAVIRUS, NAA: SARS-CoV-2, NAA: NOT DETECTED

## 2019-10-07 ENCOUNTER — Ambulatory Visit (INDEPENDENT_AMBULATORY_CARE_PROVIDER_SITE_OTHER): Payer: No Typology Code available for payment source | Admitting: Obstetrics & Gynecology

## 2019-10-07 ENCOUNTER — Other Ambulatory Visit: Payer: Self-pay

## 2019-10-07 ENCOUNTER — Other Ambulatory Visit (HOSPITAL_COMMUNITY)
Admission: RE | Admit: 2019-10-07 | Discharge: 2019-10-07 | Disposition: A | Discharge status: Home / Self Care | Payer: No Typology Code available for payment source | Source: Ambulatory Visit | Attending: Obstetrics & Gynecology | Admitting: Obstetrics & Gynecology

## 2019-10-07 ENCOUNTER — Encounter: Payer: Self-pay | Admitting: Obstetrics & Gynecology

## 2019-10-07 ENCOUNTER — Other Ambulatory Visit: Payer: No Typology Code available for payment source

## 2019-10-07 VITALS — BP 106/62 | HR 71 | Ht <= 58 in | Wt 108.0 lb

## 2019-10-07 DIAGNOSIS — Z01419 Encounter for gynecological examination (general) (routine) without abnormal findings: Secondary | ICD-10-CM

## 2019-10-07 DIAGNOSIS — N926 Irregular menstruation, unspecified: Secondary | ICD-10-CM

## 2019-10-07 DIAGNOSIS — N83202 Unspecified ovarian cyst, left side: Secondary | ICD-10-CM

## 2019-10-07 NOTE — Progress Notes (Signed)
Subjective:     Maria Smith is a 24 y.o. female here for a routine exam.  Current complaints: Pt was being seen by [facility] in 2019. Prior to that visit her cycles were reg and monthly.  She was given shots and pills for ovulation induction in preparation for IUI with donor sperm for her and her partner.   Her partner is now preg with twins. The pt is concerned about her cycles which are now every 40+ days. She has been evaluated for PCOS. Her main concern is pain in her left ovary. She was recently seen by Dr. Oscar Smith who dx'd 1-2 cysts on her left ovary. Both small.   Pt is unclear whether she ever wants to be a 'carrier' but, is concerned about the pain and any long term effects from the infertility treatments.   Pt with h/o Migraines with aura in HS. None recently. EES contraindicated.     Gynecologic History Patient's last menstrual period was 09/17/2019. Contraception: female only partner. monogamous Last Pap: > 2 years prev.  Last mammogram: n/a  Obstetric History OB History  Gravida Para Term Preterm AB Living  0 0 0 0 0 0  SAB TAB Ectopic Multiple Live Births  0 0 0 0 0   The following portions of the patient's history were reviewed and updated as appropriate: allergies, current medications, past family history, past medical history, past social history, past surgical history and problem list.  Review of Systems Pertinent items are noted in HPI.    Objective:  BP 106/62   Pulse 71   Ht 4\' 10"  (1.473 m)   Wt 108 lb (49 kg)   LMP 09/17/2019   BMI 22.57 kg/m  General Appearance:    Alert, cooperative, no distress, appears stated age  Head:    Normocephalic, without obvious abnormality, atraumatic  Eyes:    conjunctiva/corneas clear, EOM's intact, both eyes  Ears:    Normal external ear canals, both ears  Nose:   Nares normal, septum midline, mucosa normal, no drainage    or sinus tenderness  Throat:   Lips, mucosa, and tongue normal; teeth and  gums normal  Neck:   Supple, symmetrical, trachea midline, no adenopathy;    thyroid:  no enlargement/tenderness/nodules  Back:     Symmetric, no curvature, ROM normal, no CVA tenderness  Lungs:     respirations unlabored  Chest Wall:    No tenderness or deformity   Heart:    Regular rate and rhythm  Breast Exam:    No tenderness, masses, or nipple abnormality  Abdomen:     Soft, non-tender, bowel sounds active all four quadrants,    no masses, no organomegaly  Genitalia:    Normal female without lesion, discharge or tenderness     Extremities:   Extremities normal, atraumatic, no cyanosis or edema  Pulses:   2+ and symmetric all extremities  Skin:   Skin color, texture, turgor normal, no rashes or lesions    Assessment:    Healthy female exam.   Irreg cycles noted after ovulation induction in 2019 Left ov cyst.    Plan:    f/u PAP today Records from Dr. 2020 reviewed Discussed with pt option for mangement of ov cyst including POPs  Pt want to consider above.  All questions answered.  F/u in 3 months for repeat Maria Smith if pain still persists  Maria Smith, M.D., [place]

## 2019-10-09 LAB — CYTOLOGY - PAP: Diagnosis: NEGATIVE

## 2019-10-12 ENCOUNTER — Other Ambulatory Visit: Payer: No Typology Code available for payment source

## 2019-10-14 ENCOUNTER — Ambulatory Visit: Payer: No Typology Code available for payment source | Attending: Internal Medicine

## 2019-10-14 DIAGNOSIS — Z20822 Contact with and (suspected) exposure to covid-19: Secondary | ICD-10-CM

## 2019-10-15 LAB — NOVEL CORONAVIRUS, NAA: SARS-CoV-2, NAA: NOT DETECTED

## 2019-10-15 LAB — SARS-COV-2, NAA 2 DAY TAT

## 2019-10-23 ENCOUNTER — Other Ambulatory Visit: Payer: Self-pay

## 2019-10-23 ENCOUNTER — Ambulatory Visit (INDEPENDENT_AMBULATORY_CARE_PROVIDER_SITE_OTHER)
Admission: EM | Admit: 2019-10-23 | Discharge: 2019-10-23 | Disposition: A | Discharge status: Home / Self Care | Payer: No Typology Code available for payment source | Source: Home / Self Care

## 2019-10-23 ENCOUNTER — Encounter (HOSPITAL_COMMUNITY): Payer: Self-pay | Admitting: Emergency Medicine

## 2019-10-23 ENCOUNTER — Emergency Department (HOSPITAL_COMMUNITY)
Admission: EM | Admit: 2019-10-23 | Discharge: 2019-10-23 | Discharge status: Against Medical Advice | Payer: No Typology Code available for payment source | Attending: Emergency Medicine | Admitting: Emergency Medicine

## 2019-10-23 ENCOUNTER — Encounter (HOSPITAL_COMMUNITY): Payer: Self-pay

## 2019-10-23 ENCOUNTER — Ambulatory Visit (INDEPENDENT_AMBULATORY_CARE_PROVIDER_SITE_OTHER): Payer: No Typology Code available for payment source

## 2019-10-23 DIAGNOSIS — R1012 Left upper quadrant pain: Secondary | ICD-10-CM | POA: Diagnosis not present

## 2019-10-23 DIAGNOSIS — Z5321 Procedure and treatment not carried out due to patient leaving prior to being seen by health care provider: Secondary | ICD-10-CM | POA: Insufficient documentation

## 2019-10-23 DIAGNOSIS — R1032 Left lower quadrant pain: Secondary | ICD-10-CM | POA: Diagnosis not present

## 2019-10-23 LAB — COMPREHENSIVE METABOLIC PANEL
ALT: 18 U/L (ref 0–44)
AST: 23 U/L (ref 15–41)
Albumin: 3.9 g/dL (ref 3.5–5.0)
Alkaline Phosphatase: 41 U/L (ref 38–126)
Anion gap: 8 (ref 5–15)
BUN: <5 mg/dL — ABNORMAL LOW (ref 6–20)
CO2: 23 mmol/L (ref 22–32)
Calcium: 9.1 mg/dL (ref 8.9–10.3)
Chloride: 106 mmol/L (ref 98–111)
Creatinine, Ser: 0.77 mg/dL (ref 0.44–1.00)
GFR calc Af Amer: >60 mL/min (ref >60)
GFR calc non Af Amer: >60 mL/min (ref >60)
Glucose, Bld: 94 mg/dL (ref 70–99)
Potassium: 3.5 mmol/L (ref 3.5–5.1)
Sodium: 137 mmol/L (ref 135–145)
Total Bilirubin: 1.1 mg/dL (ref 0.3–1.2)
Total Protein: 7 g/dL (ref 6.5–8.1)

## 2019-10-23 LAB — I-STAT BETA HCG BLOOD, ED (MC, WL, AP ONLY): I-stat hCG, quantitative: <5 m[IU]/mL (ref <5)

## 2019-10-23 LAB — URINALYSIS, ROUTINE W REFLEX MICROSCOPIC
Bilirubin Urine: NEGATIVE
Glucose, UA: NEGATIVE mg/dL
Hgb urine dipstick: NEGATIVE
Ketones, ur: NEGATIVE mg/dL
Leukocytes,Ua: NEGATIVE
Nitrite: NEGATIVE
Protein, ur: NEGATIVE mg/dL
Specific Gravity, Urine: 1.005 (ref 1.005–1.030)
pH: 6 (ref 5.0–8.0)

## 2019-10-23 LAB — CBC
HCT: 40.2 % (ref 36.0–46.0)
Hemoglobin: 12.8 g/dL (ref 12.0–15.0)
MCH: 27.9 pg (ref 26.0–34.0)
MCHC: 31.8 g/dL (ref 30.0–36.0)
MCV: 87.8 fL (ref 80.0–100.0)
Platelets: 324 10*3/uL (ref 150–400)
RBC: 4.58 MIL/uL (ref 3.87–5.11)
RDW: 12.5 % (ref 11.5–15.5)
WBC: 6.2 10*3/uL (ref 4.0–10.5)
nRBC: 0 % (ref 0.0–0.2)

## 2019-10-23 LAB — LIPASE, BLOOD: Lipase: 48 U/L (ref 11–51)

## 2019-10-23 MED ORDER — HYOSCYAMINE SULFATE SL 0.125 MG SL SUBL
1.0000 | SUBLINGUAL_TABLET | Freq: Three times a day (TID) | SUBLINGUAL | 0 refills | Status: DC | PRN
Start: 2019-10-23 — End: 2020-03-23

## 2019-10-23 MED ORDER — SODIUM CHLORIDE 0.9% FLUSH: 3.0000 mL | Freq: Once | INTRAVENOUS | Status: DC

## 2019-10-23 NOTE — Discharge Instructions (Addendum)
Stay on clear liquids today.  If pain worsens, further imaging will be necessary, so please return to emergency where those tests can be done.

## 2019-10-23 NOTE — ED Notes (Addendum)
Encouraged pt to stay,pt said she had to be at work could not wait any longer

## 2019-10-23 NOTE — ED Provider Notes (Signed)
[facility]    CSN: [id number] Arrival date & time: 10/23/19  4401      History   Chief Complaint Chief Complaint  Patient presents with  . Abdominal Pain    HPI Maria Smith is a 24 y.o. female.   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  She was seen last night in the emergency department and left without being seen.  Pt states she has LLQ pain x 2 days. Pt states she has tried PeptoBismol and Mylanta. She's had some nausea last night, but no vomiting.  No fever or chills.  She had two soft stools yesterday.  No past h/o similar symptoms.  Patient recalls eating meat last weekend for the first time in weeks.  Patient is married to another woman.  Her periods are irregular.  She works in [employer] in the [employer].      Past Medical History:  Diagnosis Date  . Allergic rhinitis   . Allergy    SEASONAL  . Asthma   . Eczema   . Environmental allergies    Age 76 years  . Fatty liver 12/11/2018   Noted on CT 6/20  . Generalized headaches    Began at age 84 years  . GERD (gastroesophageal reflux disease)   . Influenza    Age 78 years  . 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 Streptococcal infection(041.00)    Age 42 and 24 years old  . TB lung, latent    2017  . 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/2018   Diffuse enlargement on [place] 6/20    Patient Active Problem List   Diagnosis Date Noted  . History of TB (tuberculosis) 08/21/2019  . Sore throat 01/28/2019  . Odynophagia 01/28/2019  . Neck pain 01/28/2019  . Thyroiditis 01/12/2019  . 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/2018  . Fatty liver 12/11/2018  . Overweight (BMI 25.0-29.9) 11/12/2018  . PCOS (polycystic ovarian syndrome) 11/11/2018  . Seasonal allergies 09/22/2018  . Mild intermittent asthma with (acute) exacerbation 09/01/2018  . 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 Migraine with aura and without status migrainosus, not intractable 09/11/2012    . Variants of migraine, not elsewhere classified, without mention of intractable migraine without mention of status migrainosus 09/11/2012  . Unspecified constipation 09/11/2012  . Syncope 08/31/2010  . Neck Pain 08/31/2010  . Insomnia 08/31/2010    Past Surgical History:  Procedure Laterality Date  . TONSILLECTOMY  2012  . WISDOM TOOTH EXTRACTION      OB History    Gravida  0   Para  0   Term  0   Preterm  0   AB  0   Living  0     SAB  0   TAB  0   Ectopic  0   Multiple  0   Live Births  0            Home Medications    Prior to Admission medications   Medication Sig Start Date End Date Taking? Authorizing Provider  BLACK ELDERBERRY,BERRY-FLOWER, PO Take by mouth.    [provider]  Hyoscyamine Sulfate SL (LEVSIN/SL) 0.125 MG SUBL Place 1 tablet under the tongue every 8 (eight) hours as needed. 10/23/19   Elvina Sidle, MD  Multiple Vitamin (MULTIVITAMIN) tablet Take 1 tablet by mouth daily.    [provider]  omeprazole (PRILOSEC) 40 MG capsule Take 1 capsule (40 mg total) by mouth 2 (two) times  daily. 07/03/19   Billie Ruddy, MD  albuterol (VENTOLIN HFA) 108 (90 Base) MCG/ACT inhaler Inhale 2 puffs into the lungs every 6 (six) hours as needed for wheezing or shortness of breath. Patient not taking: Reported on 10/07/2019 06/03/19 10/23/19  Billie Ruddy, MD  levocetirizine (XYZAL) 5 MG tablet TAKE 1 TABLET BY MOUTH EVERY DAY IN THE EVENING Patient not taking: No sig reported 04/15/19 10/23/19  Billie Ruddy, MD    Family History Family History  Problem Relation Age of Onset  . Migraines Paternal Grandmother   . Migraines Paternal Grandfather   . HIV Father   . Diabetes Mother   . Hypertension Mother   . Anemia Mother   . COPD Mother   . CVA Mother   . Congestive Heart Failure Mother   . Cirrhosis Mother   . Obesity Mother   . Irritable bowel syndrome Mother   . Ulcers Brother   . Anemia Sister   . Cholecystitis  Brother   . Migraines Paternal Aunt   . Migraines Maternal Uncle        Maternal Great Uncle  . Diabetes Other        Family History  . Hypertension Other        Family History  . Depression Other        Family History  . Asthma Other        Family History  . Allergic rhinitis Other        Family History  . Asthma Brother   . Colon cancer Neg Hx   . Colon polyps Neg Hx   . Esophageal cancer Neg Hx   . Rectal cancer Neg Hx   . Stomach cancer Neg Hx     Social History Social History   Tobacco Use  . Smoking status: Never Smoker  . Smokeless tobacco: Never Used  Substance Use Topics  . Alcohol use: Not Currently  . Drug use: No     Allergies   Patient has no known allergies.   Review of Systems Review of Systems  Constitutional: Negative.   HENT: Negative.   Gastrointestinal: Positive for abdominal pain and nausea. Negative for diarrhea and vomiting.  Genitourinary: Positive for menstrual problem.  All other systems reviewed and are negative.    Physical Exam Triage Vital Signs ED Triage Vitals  Enc Vitals Group     BP 10/23/19 0843 112/72     Pulse Rate 10/23/19 0843 65     Resp 10/23/19 0843 16     Temp 10/23/19 0843 98.3 F (36.8 C)     Temp Source 10/23/19 0843 Oral     SpO2 10/23/19 0843 98 %     Weight 10/23/19 0839 109 lb 6.4 oz (49.6 kg)     Height --      Head Circumference --      Peak Flow --      Pain Score 10/23/19 0841 7     Pain Loc --      Pain Edu? --      Excl. in [place]? --    No data found.  Updated Vital Signs BP 112/72 (BP Location: Right Arm)   Pulse 65   Temp 98.3 F (36.8 C) (Oral)   Resp 16   Wt 49.6 kg   LMP 09/28/2019 (Exact Date) Comment: per provider: patient was at the ED last night, LWBS, prior to leaving a pregnancy test was run: negative  SpO2 98%   BMI 22.86  kg/m   Physical Exam Vitals and nursing note reviewed.  Constitutional:      General: She is not in acute distress.    Appearance: She is  well-developed and normal weight. She is not ill-appearing or toxic-appearing.  Cardiovascular:     Rate and Rhythm: Normal rate and regular rhythm.  Pulmonary:     Effort: Pulmonary effort is normal.  Abdominal:     General: Abdomen is flat. Bowel sounds are normal.     Palpations: Abdomen is soft.     Tenderness: There is abdominal tenderness in the right lower quadrant and left lower quadrant.  Skin:    General: Skin is warm and dry.  Neurological:     General: No focal deficit present.     Mental Status: She is alert and oriented to person, place, and time.  Psychiatric:        Mood and Affect: Mood normal.        Behavior: Behavior normal.      UC Treatments / Results  Labs (all labs ordered are listed, but only abnormal results are displayed) CBC Latest Ref Rng & Units 10/23/2019 08/21/2019 03/05/2019  WBC 4.0 - 10.5 K/uL 6.2 4.3 4.2  Hemoglobin 12.0 - 15.0 g/dL 16.9 67.8 93.8  Hematocrit 36.0 - 46.0 % 40.2 38.0 39.9  Platelets 150 - 400 K/uL 324 276 300.0   Normal U/A and preg test last night Normal Chemistries and lipase from last night.   Radiology  KUB:  No acute findings.  Procedures Procedures (including critical care time)  Medications Ordered in UC Medications - No data to display  Initial Impression / Assessment and Plan / UC Course  I have reviewed the triage vital signs and the nursing notes.  Pertinent labs & imaging results that were available during my care of the patient were reviewed by me and considered in my medical decision making (see chart for details).    Final Clinical Impressions(s) / UC Diagnoses   Final diagnoses:  Left upper quadrant abdominal pain     Discharge Instructions     Stay on clear liquids today.  If pain worsens, further imaging will be necessary, so please return to emergency where those tests can be done.    ED Prescriptions    Medication Sig Dispense Auth. Provider   Hyoscyamine Sulfate SL (LEVSIN/SL) 0.125 MG  SUBL Place 1 tablet under the tongue every 8 (eight) hours as needed. 10 tablet Elvina Sidle, MD     I have reviewed the PDMP during this encounter.   Elvina Sidle, MD 10/23/19 [phone]

## 2019-10-23 NOTE — ED Triage Notes (Signed)
Pt c/o LLQ pain that started Wednesday night, c/o some nausea and soft stools. Denies urinary symptoms.

## 2019-10-23 NOTE — ED Triage Notes (Signed)
Pt states she has LLQ pain x 2 days. Pt states she was at the ER last night and LWBS. Pt states she has tried [employer] and Mylanta.

## 2019-11-03 IMAGING — DX CHEST - 2 VIEW
2 series · 2 of 2 positions shown · non-contrast
Comparison: 10/05/2018

CLINICAL DATA: Chest pain and cough, history of asthma

EXAM:
CHEST - 2 VIEW

[chest pa]
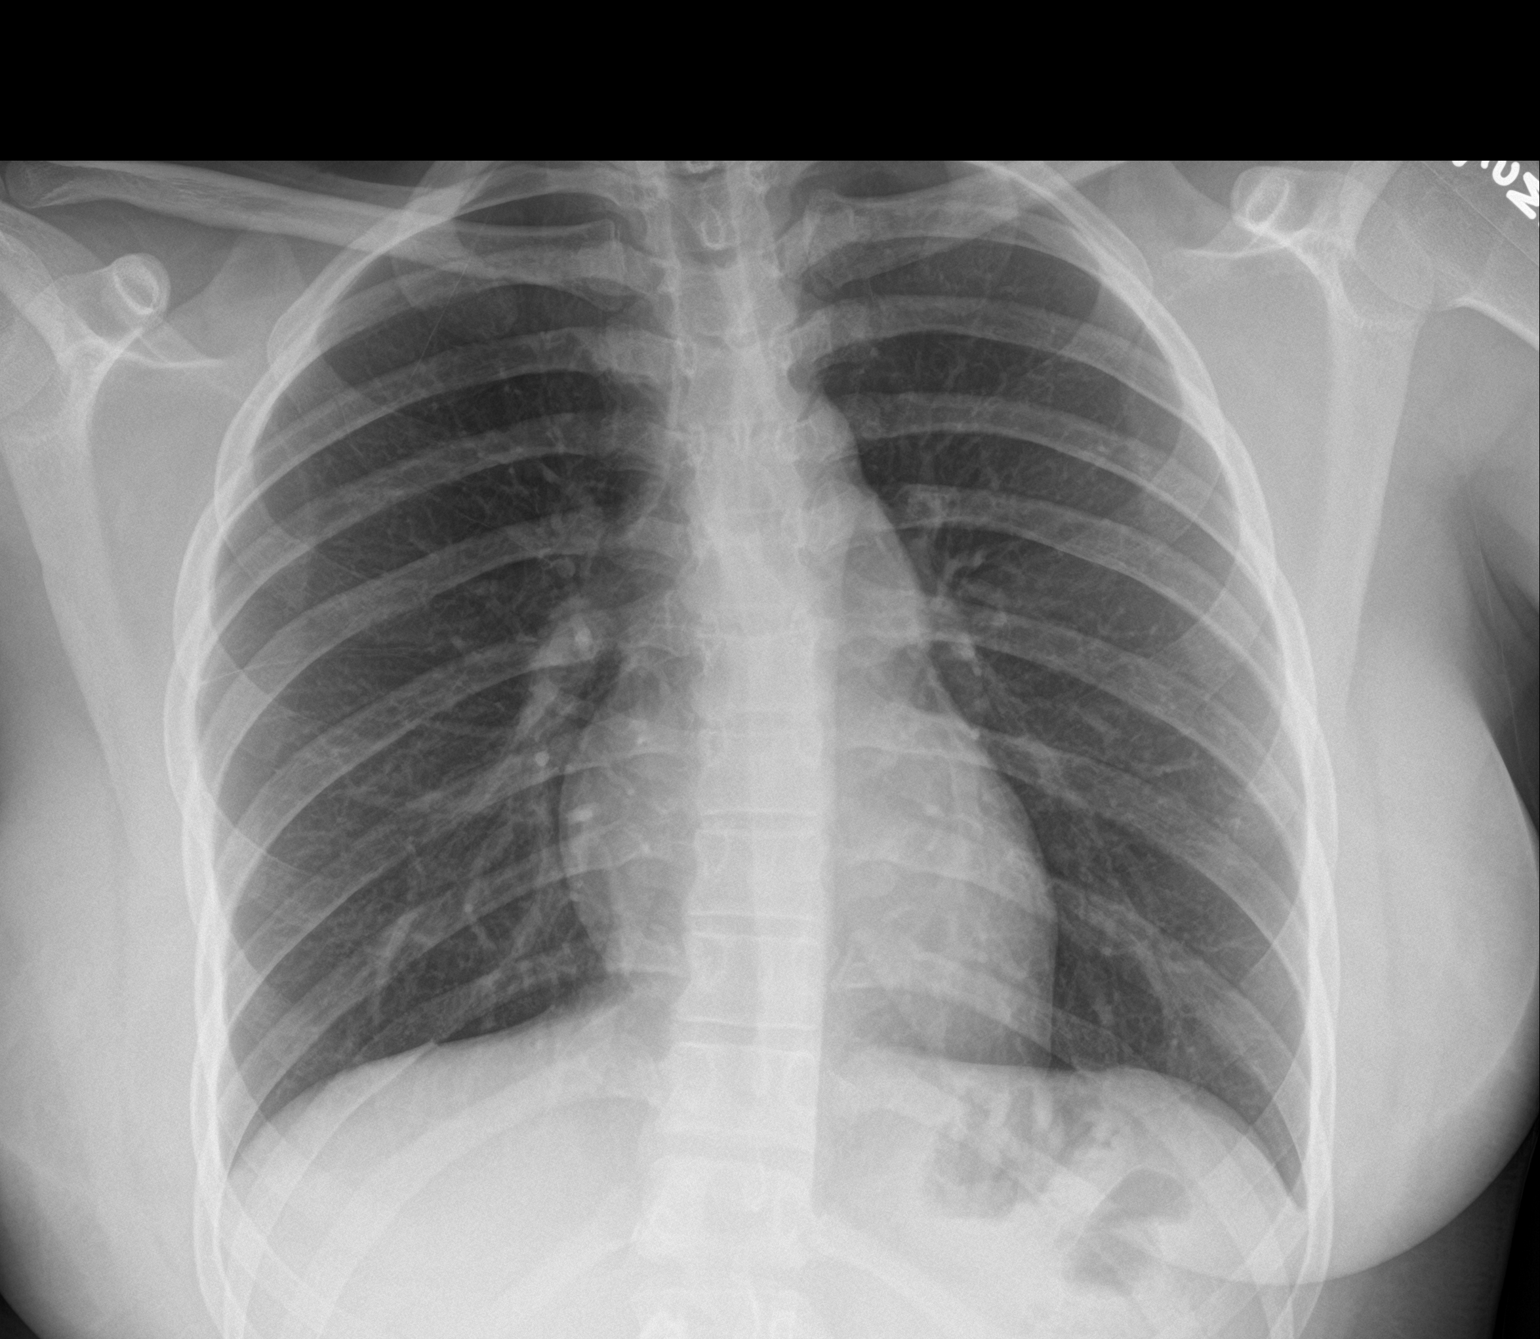

[chest lat]
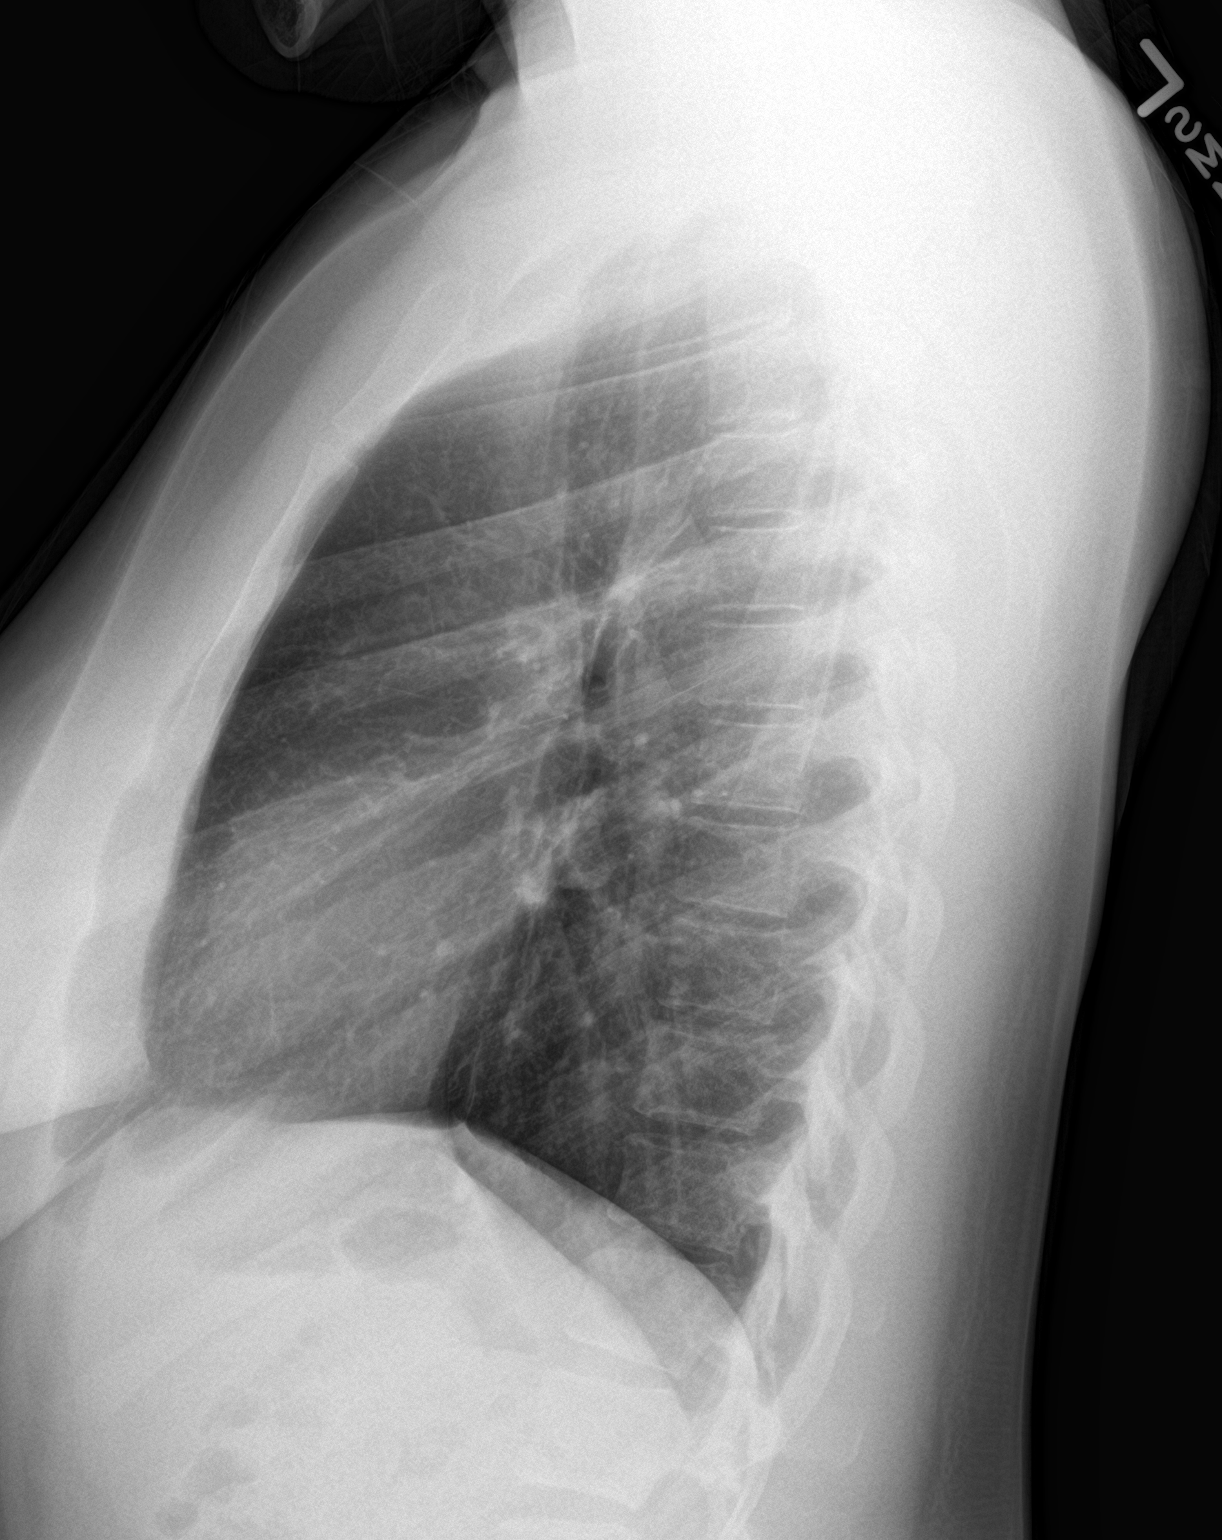

[2 of 2 positions shown; findings below may reference images not displayed]

FINDINGS: The heart size and mediastinal contours are within normal limits.
Both lungs are clear. The visualized skeletal structures are
unremarkable.
IMPRESSION: No active cardiopulmonary disease.

## 2019-11-04 ENCOUNTER — Ambulatory Visit: Payer: No Typology Code available for payment source | Attending: Internal Medicine

## 2019-11-04 DIAGNOSIS — Z20822 Contact with and (suspected) exposure to covid-19: Secondary | ICD-10-CM

## 2019-11-05 ENCOUNTER — Ambulatory Visit: Payer: No Typology Code available for payment source | Admitting: Gastroenterology

## 2019-11-05 ENCOUNTER — Telehealth: Payer: Self-pay | Admitting: Gastroenterology

## 2019-11-05 LAB — SARS-COV-2, NAA 2 DAY TAT

## 2019-11-05 LAB — NOVEL CORONAVIRUS, NAA: SARS-CoV-2, NAA: NOT DETECTED

## 2019-11-05 NOTE — Telephone Encounter (Signed)
She will call back to reschedule once she gets her June schedule at work.

## 2019-11-05 NOTE — Telephone Encounter (Signed)
She cancelled 09/28/19 visit, as well.

## 2019-11-09 ENCOUNTER — Ambulatory Visit: Payer: No Typology Code available for payment source | Attending: Internal Medicine

## 2019-11-09 DIAGNOSIS — Z20822 Contact with and (suspected) exposure to covid-19: Secondary | ICD-10-CM

## 2019-11-10 ENCOUNTER — Other Ambulatory Visit: Payer: Self-pay

## 2019-11-10 ENCOUNTER — Emergency Department
Admission: EM | Admit: 2019-11-10 | Discharge: 2019-11-10 | Disposition: A | Discharge status: Home / Self Care | Payer: No Typology Code available for payment source | Attending: Emergency Medicine | Admitting: Emergency Medicine

## 2019-11-10 ENCOUNTER — Emergency Department: Payer: No Typology Code available for payment source

## 2019-11-10 ENCOUNTER — Encounter: Payer: Self-pay | Admitting: Emergency Medicine

## 2019-11-10 DIAGNOSIS — R0989 Other specified symptoms and signs involving the circulatory and respiratory systems: Secondary | ICD-10-CM | POA: Insufficient documentation

## 2019-11-10 DIAGNOSIS — J45909 Unspecified asthma, uncomplicated: Secondary | ICD-10-CM | POA: Diagnosis not present

## 2019-11-10 DIAGNOSIS — R1310 Dysphagia, unspecified: Secondary | ICD-10-CM | POA: Insufficient documentation

## 2019-11-10 DIAGNOSIS — Z79899 Other long term (current) drug therapy: Secondary | ICD-10-CM | POA: Insufficient documentation

## 2019-11-10 DIAGNOSIS — J029 Acute pharyngitis, unspecified: Secondary | ICD-10-CM | POA: Diagnosis not present

## 2019-11-10 LAB — SARS-COV-2, NAA 2 DAY TAT

## 2019-11-10 LAB — GROUP A STREP BY PCR: Group A Strep by PCR: NOT DETECTED

## 2019-11-10 LAB — NOVEL CORONAVIRUS, NAA: SARS-CoV-2, NAA: NOT DETECTED

## 2019-11-10 MED ORDER — METHYLPREDNISOLONE 4 MG PO TBPK
ORAL_TABLET | ORAL | 0 refills | Status: DC
Start: 2019-11-10 — End: 2020-03-23

## 2019-11-10 MED ORDER — LIDOCAINE VISCOUS HCL 2 % MT SOLN
5.0000 mL | Freq: Four times a day (QID) | OROMUCOSAL | 0 refills | Status: DC | PRN
Start: 2019-11-10 — End: 2020-03-23

## 2019-11-10 NOTE — ED Provider Notes (Signed)
[facility] Emergency Department Provider Note   ____________________________________________   First MD Initiated Contact with Patient 11/10/19 0840     (approximate)  I have reviewed the triage vital signs and the nursing notes.   HISTORY  Chief Complaint Sore Throat    HPI Maria Smith is a 24 y.o. female patient complain of sore throat and dysphagia for 6 days.  Onset of complaint was a deceleration of rates crispy treat and felt that it got stuck in her throat.  Patient stated the last couple days she has increased sore throat and trouble swallowing.  Patient states she is able to tolerate fluids.  Patient states she is tested weakly for Covid due to her job.  No recent travel.  Patient rates her pain as a 7/10.  Patient described the pain as "achy".  No palliative measure for complaint.         Past Medical History:  Diagnosis Date  . Allergic rhinitis   . Allergy    SEASONAL  . Asthma   . Eczema   . Environmental allergies    Age 46 years  . Fatty liver 12/11/2018   Noted on CT 6/20  . Generalized headaches    Began at age 7 years  . GERD (gastroesophageal reflux disease)   . Influenza    Age 89 years  . 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 Streptococcal infection(041.00)    Age 82 and 24 years old  . TB lung, latent    2017  . 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/2018   Diffuse enlargement on [place] 6/20    Patient Active Problem List   Diagnosis Date Noted  . History of TB (tuberculosis) 08/21/2019  . Sore throat 01/28/2019  . Odynophagia 01/28/2019  . Neck pain 01/28/2019  . Thyroiditis 01/12/2019  . 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/2018  . Fatty liver 12/11/2018  . Overweight (BMI 25.0-29.9) 11/12/2018  . PCOS (polycystic ovarian syndrome) 11/11/2018  . Seasonal allergies 09/22/2018  . Mild intermittent asthma with (acute) exacerbation 09/01/2018  . 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 Migraine with aura and without status migrainosus,  not intractable 09/11/2012  . Variants of migraine, not elsewhere classified, without mention of intractable migraine without mention of status migrainosus 09/11/2012  . Unspecified constipation 09/11/2012  . Syncope 08/31/2010  . Neck Pain 08/31/2010  . Insomnia 08/31/2010    Past Surgical History:  Procedure Laterality Date  . TONSILLECTOMY  2012  . WISDOM TOOTH EXTRACTION      Prior to Admission medications   Medication Sig Start Date End Date Taking? Authorizing Provider  BLACK ELDERBERRY,BERRY-FLOWER, PO Take by mouth.    [provider]  Hyoscyamine Sulfate SL (LEVSIN/SL) 0.125 MG SUBL Place 1 tablet under the tongue every 8 (eight) hours as needed. 10/23/19   Elvina Sidle, MD  lidocaine (XYLOCAINE) 2 % solution Use as directed 5 mLs in the mouth or throat every 6 (six) hours as needed for mouth pain. 11/10/19   Joni Reining, PA-C  methylPREDNISolone (MEDROL DOSEPAK) 4 MG TBPK tablet Take Tapered dose as directed 11/10/19   Joni Reining, PA-C  Multiple Vitamin (MULTIVITAMIN) tablet Take 1 tablet by mouth daily.    [provider]  omeprazole (PRILOSEC) 40 MG capsule Take 1 capsule (40 mg total) by mouth 2 (two) times daily. 07/03/19   Deeann Saint, MD  albuterol (VENTOLIN HFA) 108 (90 Base) MCG/ACT inhaler Inhale 2 puffs into the lungs every 6 (six) hours as  needed for wheezing or shortness of breath. Patient not taking: Reported on 10/07/2019 06/03/19 10/23/19  Deeann Saint, MD  levocetirizine (XYZAL) 5 MG tablet TAKE 1 TABLET BY MOUTH EVERY DAY IN THE EVENING Patient not taking: No sig reported 04/15/19 10/23/19  Deeann Saint, MD    Allergies Patient has no known allergies.  Family History  Problem Relation Age of Onset  . Migraines Paternal Grandmother   . Migraines Paternal Grandfather   . HIV Father   . Diabetes Mother   . Hypertension Mother   . Anemia Mother   . COPD Mother   . CVA Mother   . Congestive Heart Failure Mother   .  Cirrhosis Mother   . Obesity Mother   . Irritable bowel syndrome Mother   . Ulcers Brother   . Anemia Sister   . Cholecystitis Brother   . Migraines Paternal Aunt   . Migraines Maternal Uncle        Maternal Great Uncle  . Diabetes Other        Family History  . Hypertension Other        Family History  . Depression Other        Family History  . Asthma Other        Family History  . Allergic rhinitis Other        Family History  . Asthma Brother   . Colon cancer Neg Hx   . Colon polyps Neg Hx   . Esophageal cancer Neg Hx   . Rectal cancer Neg Hx   . Stomach cancer Neg Hx     Social History Social History   Tobacco Use  . Smoking status: Never Smoker  . Smokeless tobacco: Never Used  Substance Use Topics  . Alcohol use: Not Currently  . Drug use: No    Review of Systems Constitutional: No fever/chills Eyes: No visual changes. ENT: Sore throat.   Cardiovascular: Denies chest pain. Respiratory: Denies shortness of breath. Gastrointestinal: No abdominal pain.  No nausea, no vomiting.  No diarrhea.  No constipation. Genitourinary: Negative for dysuria. Musculoskeletal: Negative for back pain. Skin: Negative for rash. Neurological: Negative for headaches, focal weakness or numbness.   ____________________________________________   PHYSICAL EXAM:  VITAL SIGNS: ED Triage Vitals [11/10/19 0805]  Enc Vitals Group     BP 136/79     Pulse Rate 81     Resp 18     Temp 98.7 F (37.1 C)     Temp Source Oral     SpO2 100 %     Weight 108 lb (49 kg)     Height 4\' 10"  (1.473 m)     Head Circumference      Peak Flow      Pain Score 7     Pain Loc      Pain Edu?      Excl. in [place]?    Constitutional: Alert and oriented. Well appearing and in no acute distress. Mouth/Throat: Mucous membranes are moist.  Oropharynx non-erythematous. Neck: No stridor.   Hematological/Lymphatic/Immunilogical: No cervical lymphadenopathy. Cardiovascular: Normal rate, regular  rhythm. Grossly normal heart sounds.  Good peripheral circulation. Respiratory: Normal respiratory effort.  No retractions. Lungs CTAB. Skin:  Skin is warm, dry and intact. No rash noted. ____________________________________________   LABS (all labs ordered are listed, but only abnormal results are displayed)  Labs Reviewed  GROUP A STREP BY PCR   ____________________________________________  EKG   ____________________________________________  RADIOLOGY  ED MD  interpretation:    Official radiology report(s): DG Neck Soft Tissue  Result Date: 11/10/2019 CLINICAL DATA:  Sore throat and dysphagia for several days EXAM: NECK SOFT TISSUES - 1+ VIEW COMPARISON:  None. FINDINGS: Epiglottis and aryepiglottic folds are within normal limits. No radiopaque foreign body is noted. No bony abnormality is seen. IMPRESSION: No acute abnormality noted Electronically Signed   By: Inez Catalina M.D.   On: 11/10/2019 09:04    ____________________________________________   PROCEDURES  Procedure(s) performed (including Critical Care):  Procedures   ____________________________________________   INITIAL IMPRESSION / ASSESSMENT AND PLAN / ED COURSE  As part of my medical decision making, I reviewed the following data within the [place]     Patient presents with sore throat foreign body sensation for 6 days.  Discussed negative rapid strep test and soft tissue neck x-ray findings with patient.  Patient given discharge care instruction and advised take medication as directed.  Patient given a work note for today.  Follow-up with PCP.    Jajaira C Orvan Seen was evaluated in Emergency Department on 11/10/2019 for the symptoms described in the history of present illness. She was evaluated in the context of the global COVID-19 pandemic, which necessitated consideration that the patient might be at risk for infection with the SARS-CoV-2 virus that causes COVID-19.  Institutional protocols and algorithms that pertain to the evaluation of patients at risk for COVID-19 are in a state of rapid change based on information released by regulatory bodies including the CDC and federal and state organizations. These policies and algorithms were followed during the patient's care in the ED.       ____________________________________________   FINAL CLINICAL IMPRESSION(S) / ED DIAGNOSES  Final diagnoses:  Sore throat     ED Discharge Orders         Ordered    methylPREDNISolone (MEDROL DOSEPAK) 4 MG TBPK tablet     11/10/19 0951    lidocaine (XYLOCAINE) 2 % solution  Every 6 hours PRN     11/10/19 0951           Note:  This document was prepared using Dragon voice recognition software and may include unintentional dictation errors.    Sable Feil, PA-C 11/10/19 9924    Duffy Bruce, MD 11/10/19 [phone]

## 2019-11-10 NOTE — ED Triage Notes (Signed)
Pt arrived via POV with c/o sore throat, pt states she thought food she ate was stuck in her throat, pt states she ate a sandwich and rice crispy treat and since then has felt something in her throat.  Pt states a couple of days ago she had sore throat and cough and will gag when she coughs. Pt had negative covid test this week. Is tested frequently for work.  Pt states she is able to drink liquids.

## 2019-11-10 NOTE — ED Notes (Signed)
See triage note  Presents with "something stuck in throat"  States she felt this 6 days ago  She is able to swallow

## 2019-11-10 NOTE — Discharge Instructions (Signed)
Your rapid strep test was negative.  No acute findings on soft tissue neck x-ray.  Follow discharge care instruction take medication as directed.

## 2019-11-11 ENCOUNTER — Telehealth (INDEPENDENT_AMBULATORY_CARE_PROVIDER_SITE_OTHER): Payer: No Typology Code available for payment source | Admitting: Family Medicine

## 2019-11-11 ENCOUNTER — Encounter: Payer: Self-pay | Admitting: Family Medicine

## 2019-11-11 DIAGNOSIS — J029 Acute pharyngitis, unspecified: Secondary | ICD-10-CM | POA: Diagnosis not present

## 2019-11-11 NOTE — Progress Notes (Signed)
Virtual Visit via Video Note Visit started via video, however pt never said anything.  Pt called via phone.  States she was unable to see provider.  I connected with Kristen Loader on 11/11/19 at  1:00 PM EDT by a video enabled telemedicine application 2/2 COVID-19 pandemic and verified that I am speaking with the correct person using two identifiers.  Location patient: home Location provider:work or home office Persons participating in the virtual visit: patient, provider  I discussed the limitations of evaluation and management by telemedicine and the availability of in person appointments. The patient expressed understanding and agreed to proceed.   HPI: Pt feels like something is stuck in her throat/difficulty swallowing at times.  Feeling has made her gag and vomit a few times.  Sensation is in back of throat on R side.  Pt seen in ED yesterday 5/25.  Xray was normal.  Strep was neg.  Pt given lidocaine soln and medrol dose pack.  Pt did not take the medrol dose pack.  Pt has yet to schedule a f/u with GI, had to cancel 2/2 possible Covid exposure at work.   ROS: See pertinent positives and negatives per HPI.  Past Medical History:  Diagnosis Date  . Allergic rhinitis   . Allergy    SEASONAL  . Asthma   . Eczema   . Environmental allergies    Age 24 years  . Fatty liver 12/11/2018   Noted on CT 6/20  . Generalized headaches    Began at age 24 years  . GERD (gastroesophageal reflux disease)   . Influenza    Age 24 years  . 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 Streptococcal infection(041.00)    Age 24 years old  . TB lung, latent    2017  . 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 TONSILLECTOMY  2012  . WISDOM TOOTH EXTRACTION      Family History  Problem Relation Age of Onset  . Migraines Paternal Grandmother   . Migraines Paternal Grandfather   . HIV Father    . Diabetes Mother   . Hypertension Mother   . Anemia Mother   . COPD Mother   . CVA Mother   . Congestive Heart Failure Mother   . Cirrhosis Mother   . Obesity Mother   . Irritable bowel syndrome Mother   . Ulcers Brother   . Anemia Sister   . Cholecystitis Brother   . Migraines Paternal Aunt   . Migraines Maternal Uncle        Maternal Great Uncle  . Diabetes Other        Family History  . Hypertension Other        Family History  . Depression Other        Family History  . Asthma Other        Family History  . Allergic rhinitis Other        Family History  . Asthma Brother   . Colon cancer Neg Hx   . Colon polyps Neg Hx   . Esophageal cancer Neg Hx   . Rectal cancer Neg Hx   . Stomach cancer Neg Hx     Current Outpatient Medications:  .  BLACK ELDERBERRY,BERRY-FLOWER, PO, Take by mouth., Disp: , Rfl:  .  Hyoscyamine Sulfate SL (LEVSIN/SL) 0.125 MG SUBL, Place 1 tablet under the tongue  every 8 (eight) hours as needed., Disp: 10 tablet, Rfl: 0 .  lidocaine (XYLOCAINE) 2 % solution, Use as directed 5 mLs in the mouth or throat every 6 (six) hours as needed for mouth pain., Disp: 100 mL, Rfl: 0 .  methylPREDNISolone (MEDROL DOSEPAK) 4 MG TBPK tablet, Take Tapered dose as directed, Disp: 21 tablet, Rfl: 0 .  Multiple Vitamin (MULTIVITAMIN) tablet, Take 1 tablet by mouth daily., Disp: , Rfl:  .  omeprazole (PRILOSEC) 40 MG capsule, Take 1 capsule (40 mg total) by mouth 2 (two) times daily., Disp: 60 capsule, Rfl: 3  EXAM:  VITALS per patient if applicable:   GENERAL: alert, oriented, appears well and in no acute distress   MS: moves all visible extremities without noticeable abnormality  PSYCH/NEURO: Pt sounds pleasant, cooperative, no obvious depression or anxiety, speech and thought processing grossly intact  ASSESSMENT AND PLAN:  Discussed the following assessment and plan:  Sore throat  -strep test negative yesterday in ED. -supportive care -Pt advised to  swish and spit with lidocaine soln -given precautions  Dysphagia -take smaller bites and chew food longer. -advised to f/u with GI for continued syptoms  Discussed limitations of visit via phone call.   I discussed the assessment and treatment plan with the patient. The patient was provided an opportunity to ask questions and all were answered. The patient agreed with the plan and demonstrated an understanding of the instructions.   The patient was advised to call back or seek an in-person evaluation if the symptoms worsen or if the condition fails to improve as anticipated.  I provided 11 minutes of non-face-to-face time during this encounter.   Billie Ruddy, MD

## 2019-11-18 ENCOUNTER — Other Ambulatory Visit: Payer: No Typology Code available for payment source

## 2019-11-23 ENCOUNTER — Encounter: Payer: Self-pay | Admitting: Family Medicine

## 2019-11-23 IMAGING — DX CHEST - 2 VIEW
2 series · 2 of 2 positions shown · non-contrast
Comparison: Two-view chest x-ray 11/13/2018

CLINICAL DATA: Left chest pain beginning at [DATE] a.m.

EXAM:
CHEST - 2 VIEW

[chest pa]
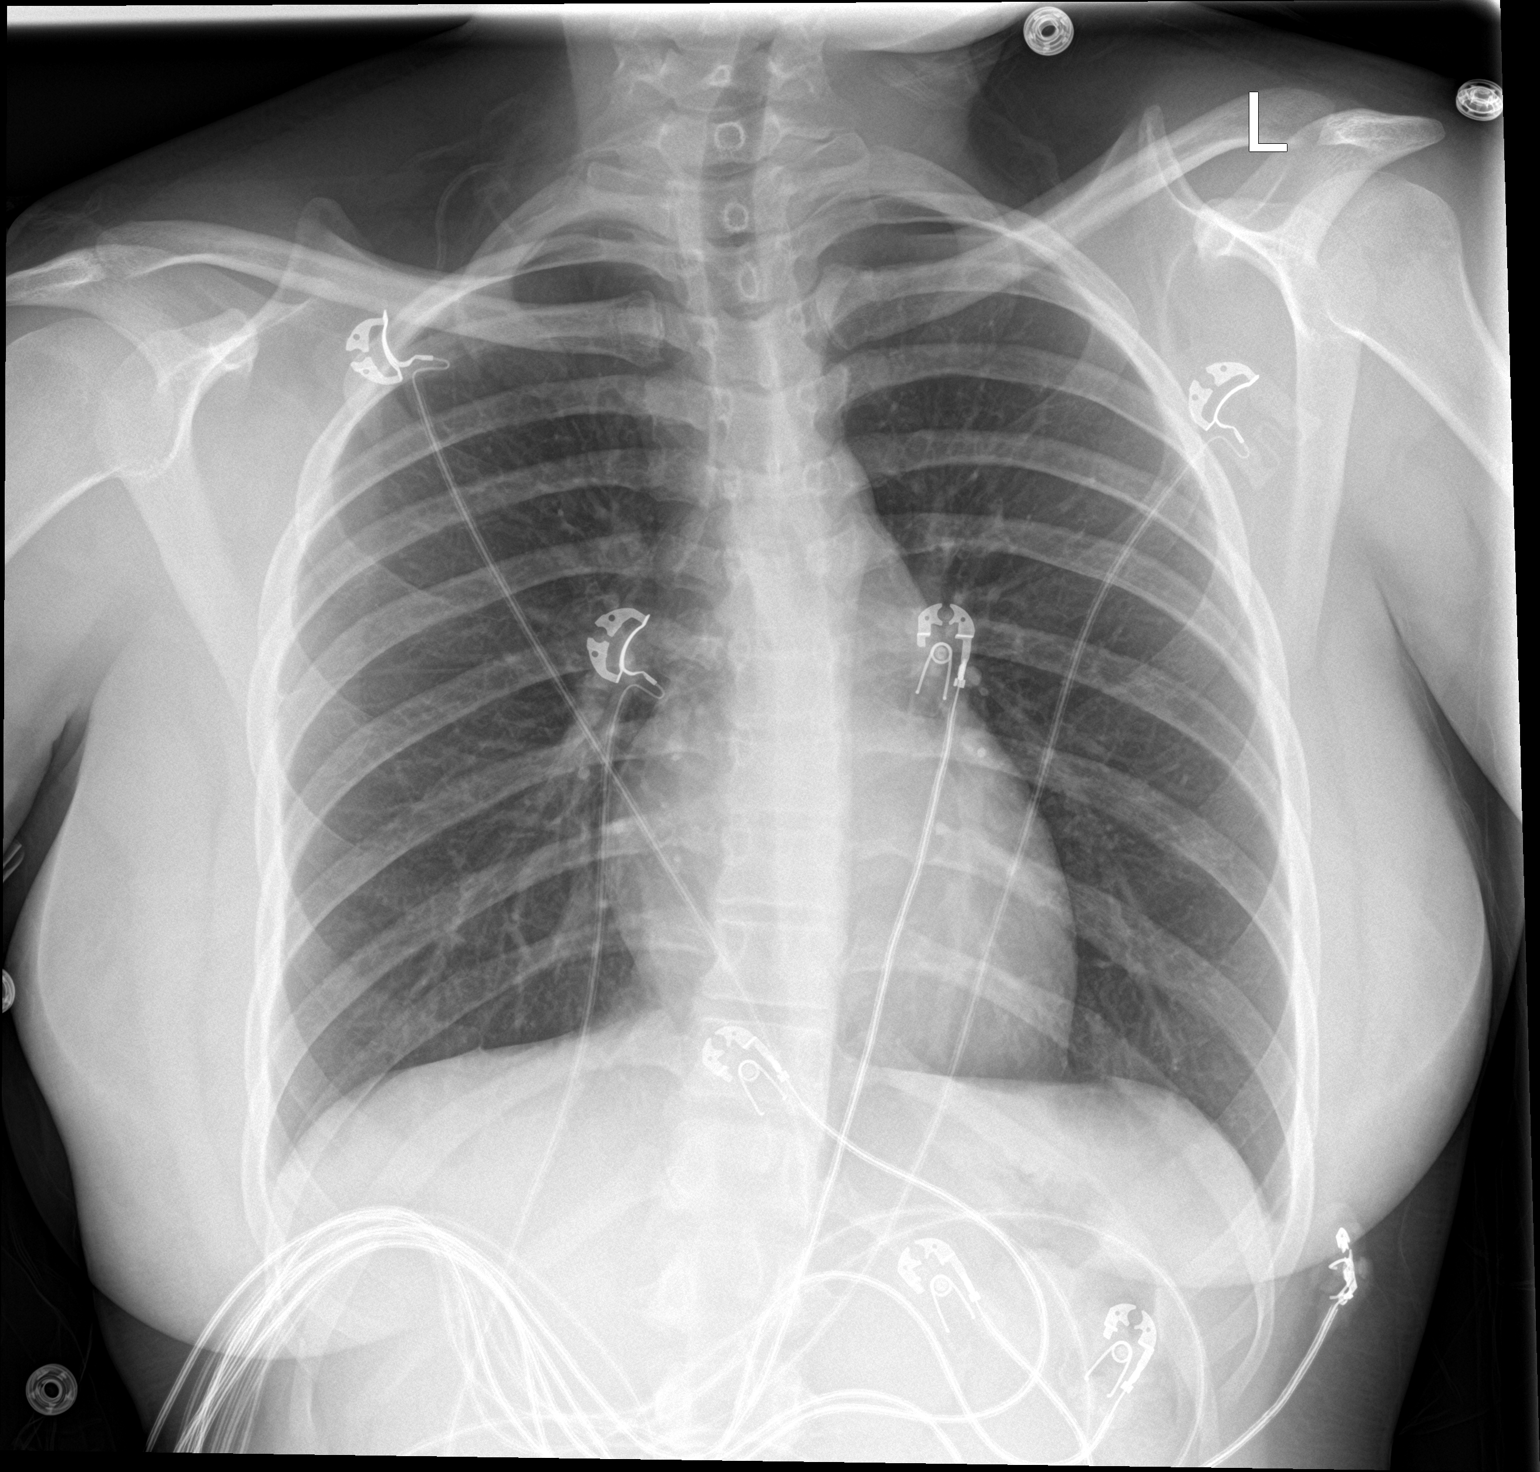

[chest lat]
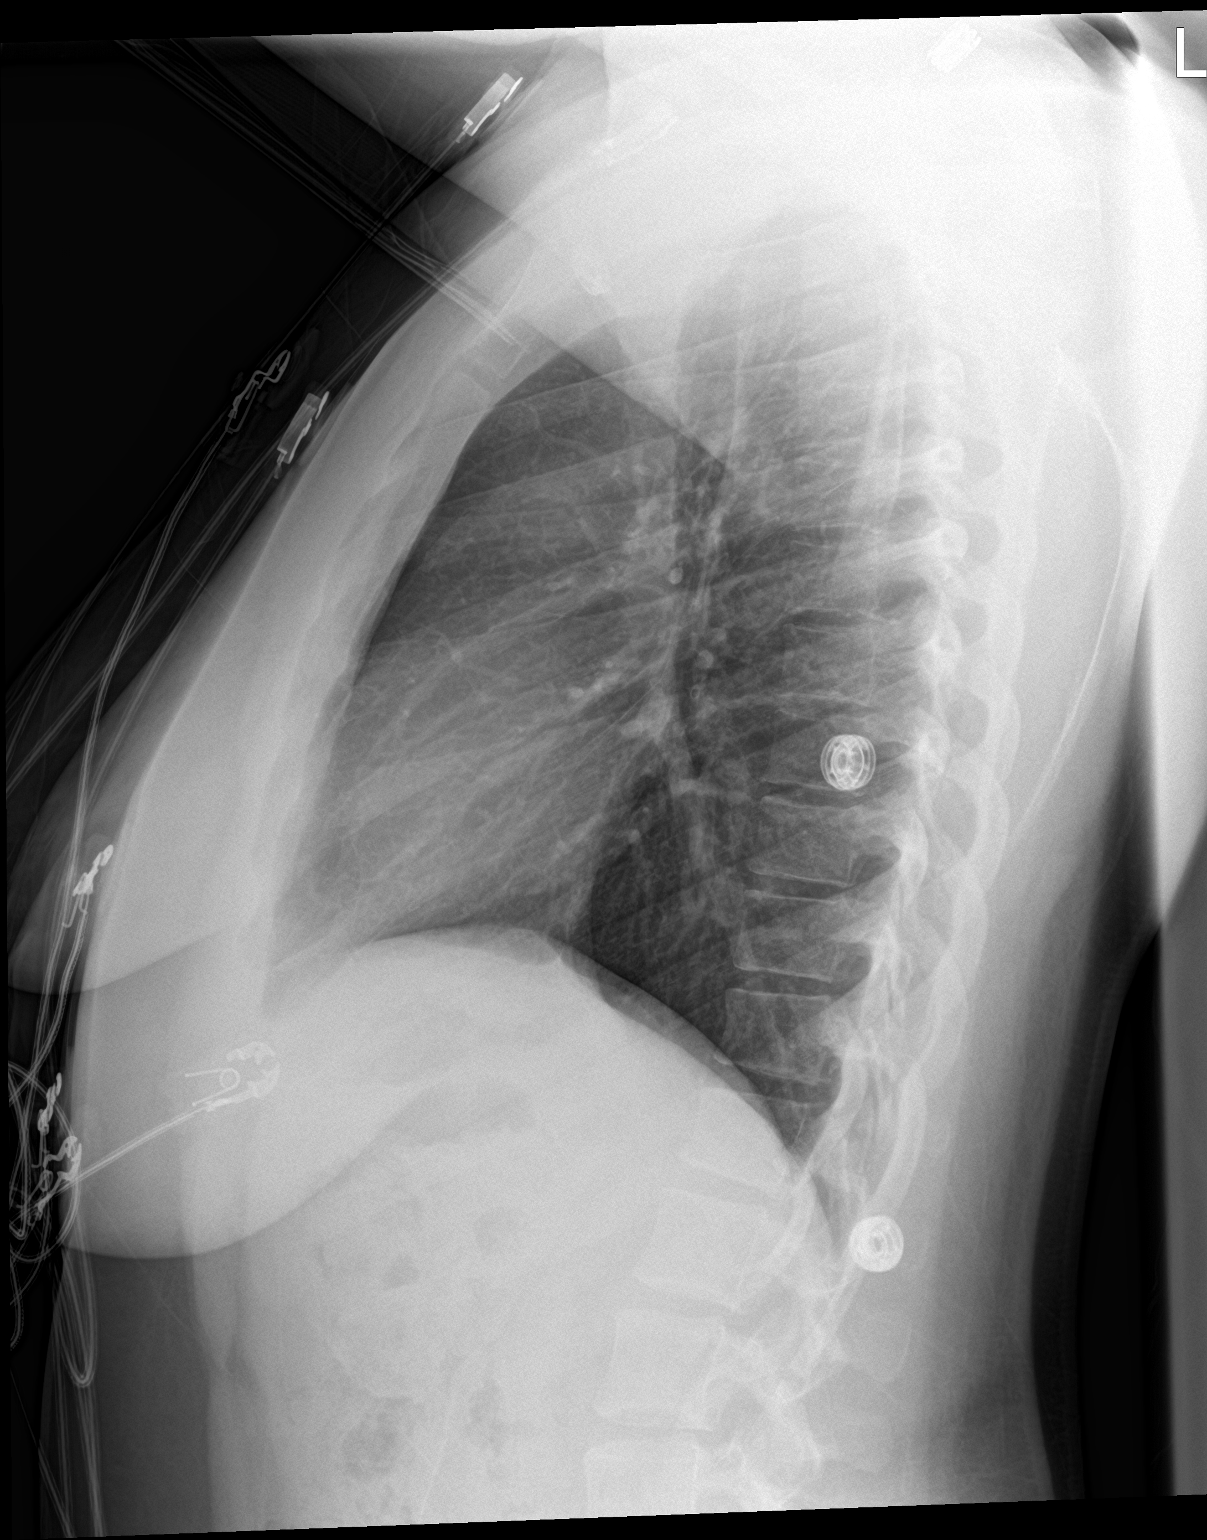

[2 of 2 positions shown; findings below may reference images not displayed]

FINDINGS: The heart size is normal. Lungs are clear. There is no edema or
effusion. No focal airspace disease present. Mild leftward curvature
is present in the thoracic spine.
IMPRESSION: Negative two view chest x-ray.

## 2019-11-30 IMAGING — US US THYROID
1 series · 14 of 25 positions shown · non-contrast
Comparison: None.

CLINICAL DATA: Palpable abnormality. 23-year-old female with
thyromegaly on physical exam.

EXAM:
THYROID ULTRASOUND
TECHNIQUE: Ultrasound examination of the thyroid gland and adjacent soft
tissues was performed.

[Series 1: us thyroid · 14 of 64 slices shown]
[im 1/64]
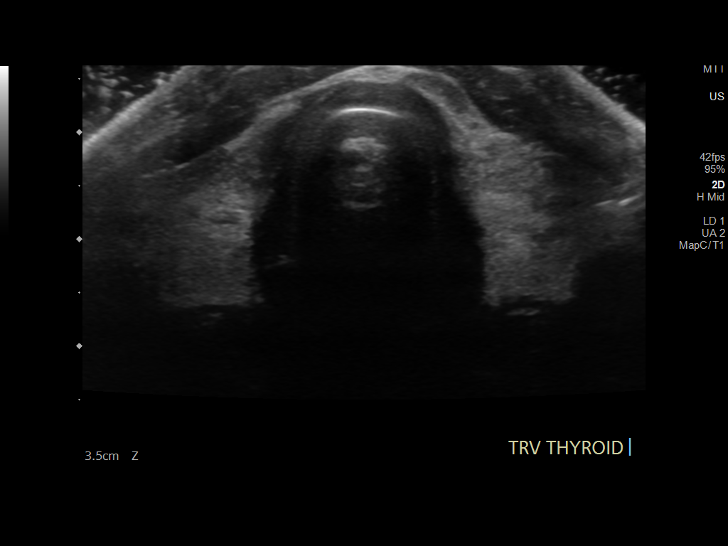
[im 6/64]
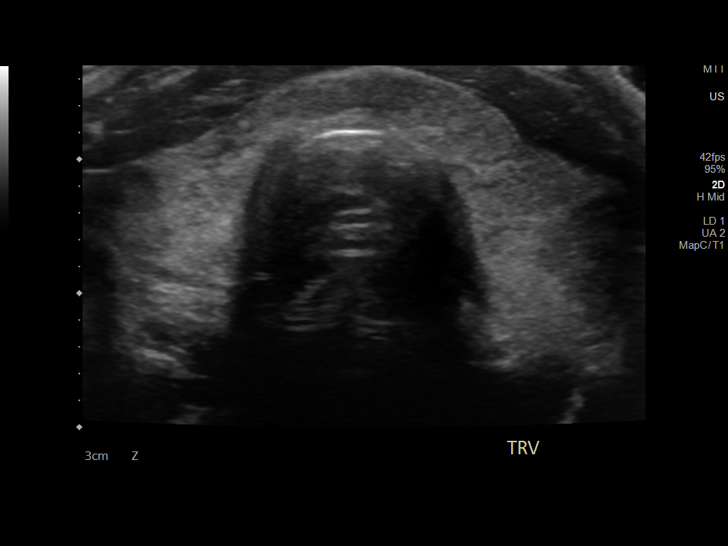
[im 11/64]
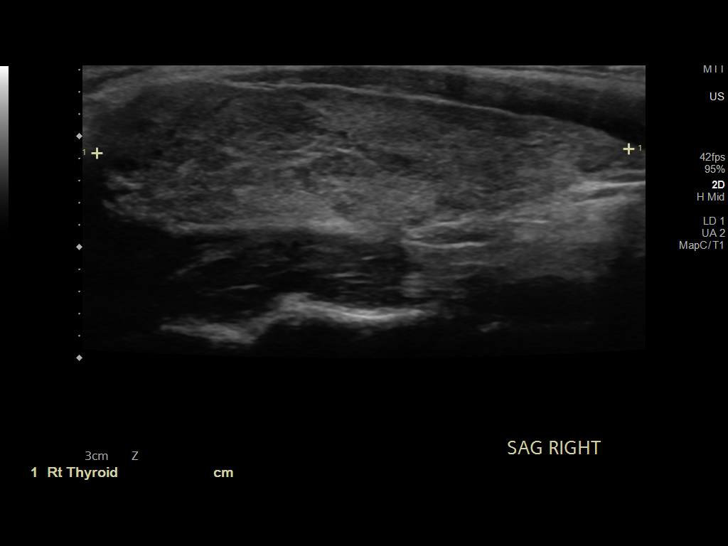
[im 16/64]
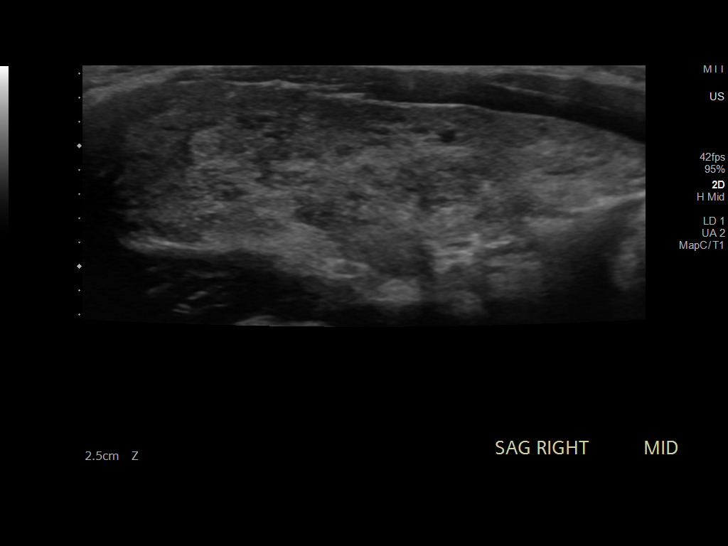
[im 22/64]
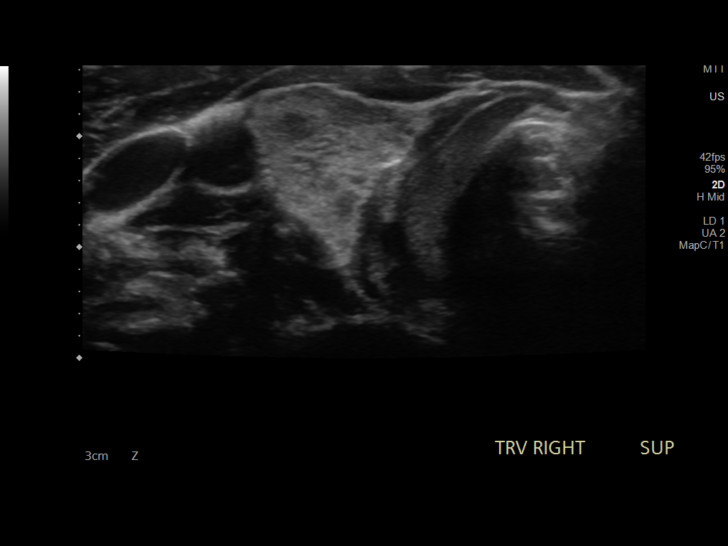
[im 24/64]
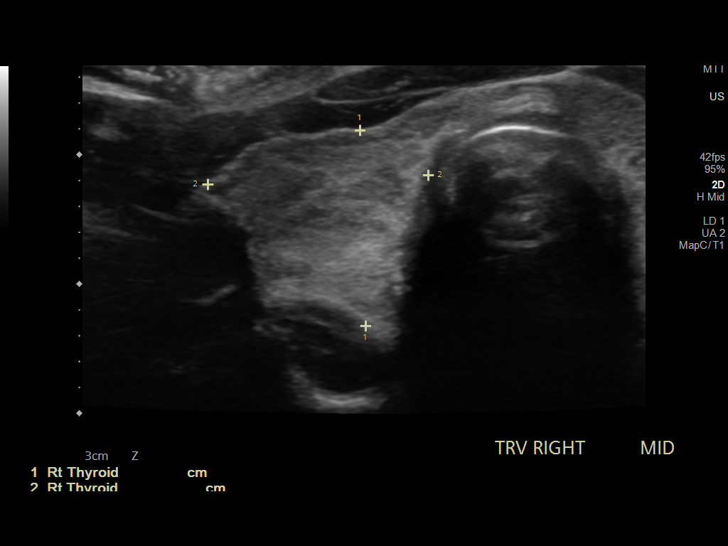
[im 29/64]
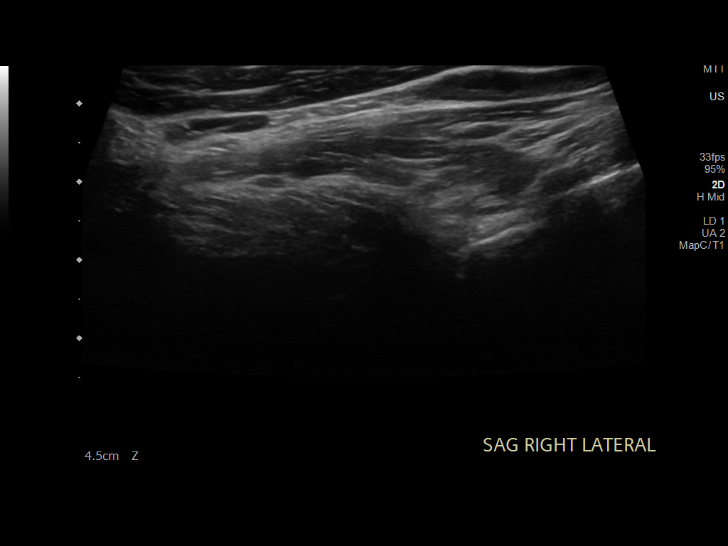
[im 35/64]
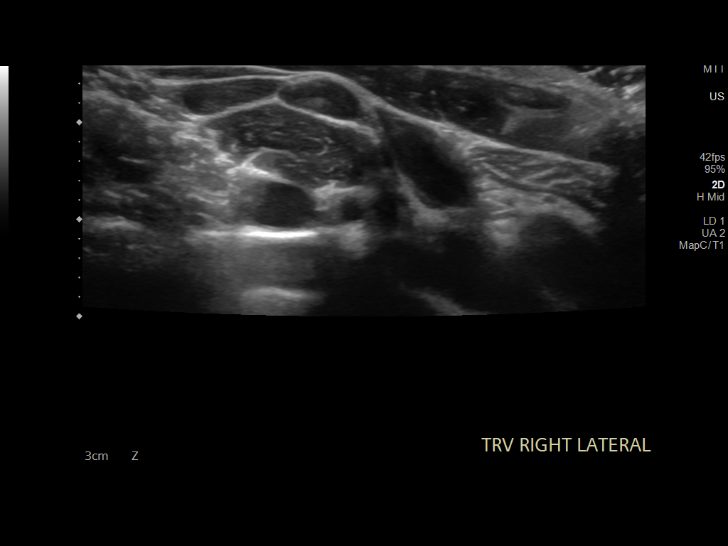
[im 40/64]
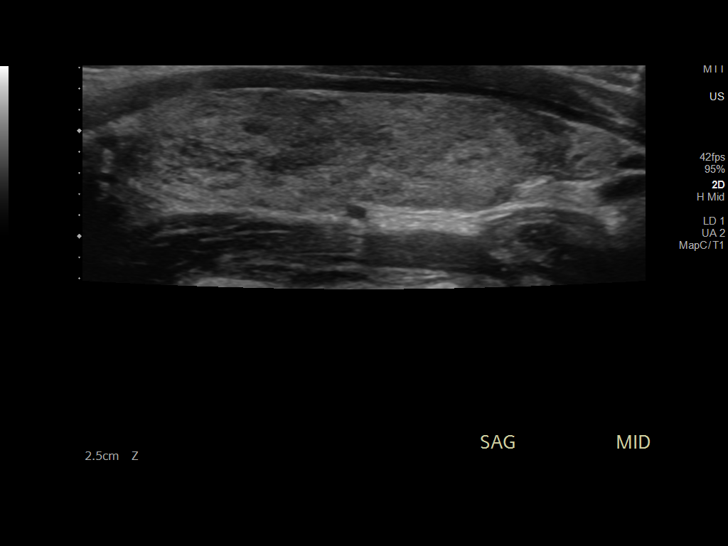
[im 43/64]
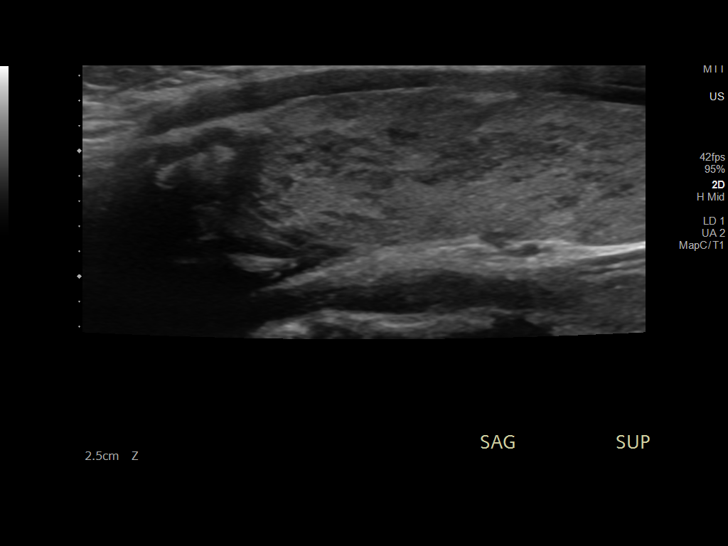
[im 48/64]
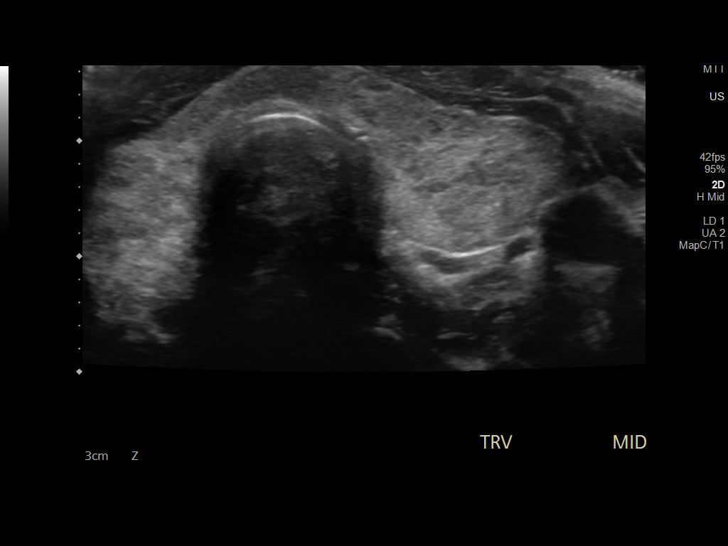
[im 53/64]
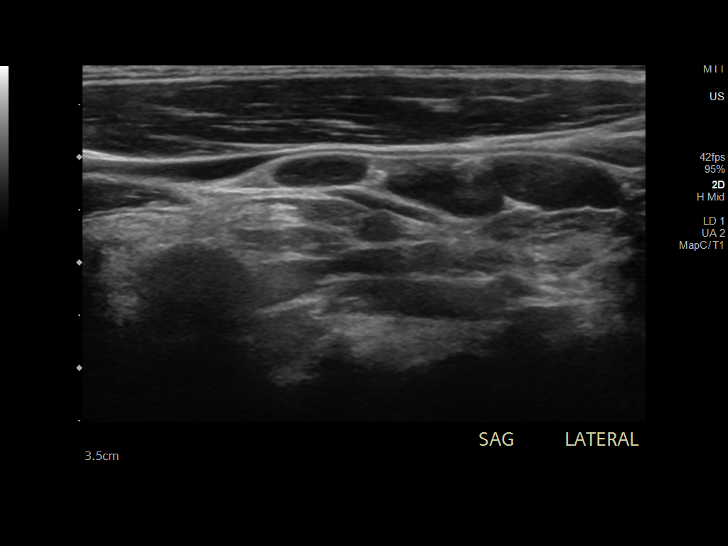
[im 58/64]
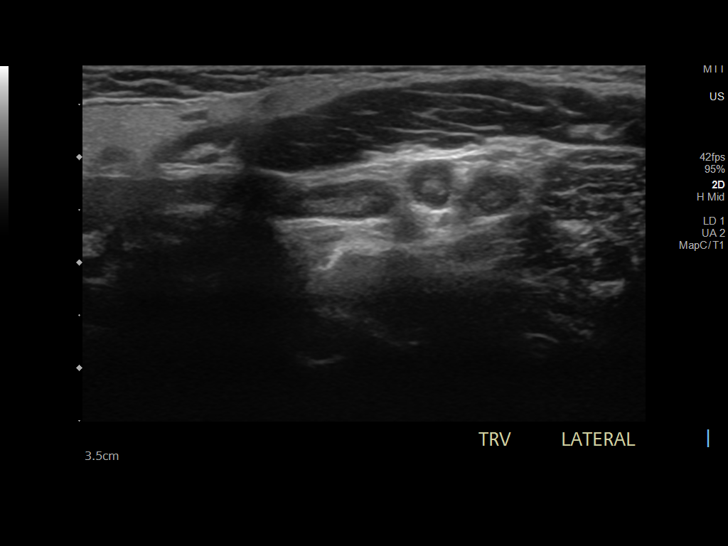
[im 64/64]
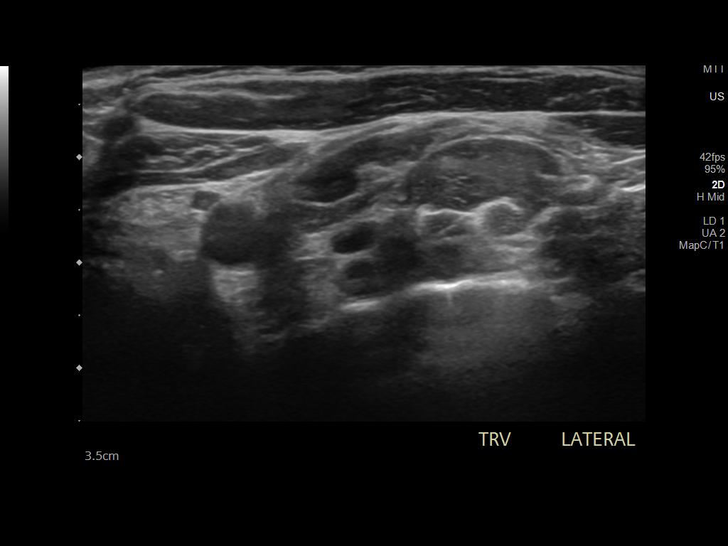

[14 of 25 positions shown; findings below may reference images not displayed]

FINDINGS: Parenchymal Echotexture: Moderately heterogenous

Isthmus: 0.4 cm

Right lobe: 4.8 x 1.5 x 1.7 cm

Left lobe: 5.0 x 1.3 x 1.7 cm

_________________________________________________________

Estimated total number of nodules >/= 1 cm: 0

Number of spongiform nodules >/=  2 cm not described below (TR1): 0

Number of mixed cystic and solid nodules >/= 1.5 cm not described
below (TR2): 0

_________________________________________________________

No discrete nodules are seen within the thyroid gland. Minimally
prominent bilateral cervical lymph nodes remain normal in size by
imaging criteria. No suspicious lymphadenopathy.
IMPRESSION: Enlarged and diffusely heterogeneous thyroid gland most consistent
with thyroiditis versus diffuse goitrous change. No discrete thyroid
nodules are identified.

## 2019-12-11 ENCOUNTER — Telehealth (INDEPENDENT_AMBULATORY_CARE_PROVIDER_SITE_OTHER): Payer: No Typology Code available for payment source | Admitting: Family Medicine

## 2019-12-11 DIAGNOSIS — R070 Pain in throat: Secondary | ICD-10-CM

## 2019-12-11 NOTE — Progress Notes (Signed)
This visit type was conducted due to national recommendations for restrictions regarding the COVID-19 pandemic in an effort to limit this patient's exposure and mitigate transmission in our community.   Virtual Visit via Video Note  I connected with [name] on 12/11/19 at  3:30 PM EDT by a video enabled telemedicine application and verified that I am speaking with the correct person using two identifiers.  Location patient: home Location provider:work or home office Persons participating in the virtual visit: patient, provider  I discussed the limitations of evaluation and management by telemedicine and the availability of in person appointments. The patient expressed understanding and agreed to proceed.   HPI: Maria Smith has had some fairly persistent throat/pharyngeal pain.  She actually had EGD back in March.  There had been some initial concern whether this may be something like eosinophilic esophagitis.  Her biopsies revealed some mild gastritis changes.  No other abnormalities noted.  GI had suggested that she see ENT.  She has had previous tonsillectomy.  She had been taking high-dose omeprazole 40 mg twice daily without relief.  She describes sore throat type symptoms mostly right side of the neck.  She has sensation when swallowing frequently that things may be going the wrong way.  No actual cough or choking with eating.  She has had persistent pain and irritation right side of neck with swallowing and went to ER and had plain films May 25 which did not show any radiopaque foreign bodies or other abnormalities.  She states that she has decent appetite but has been doing more liquid meals because of fear for pain with swallowing.  She has not noted any significant adenopathy.  No fever.  No significant postnasal drip.  No active GERD symptoms.   ROS: See pertinent positives and negatives per HPI.  Past Medical History:  Diagnosis Date  . Allergic rhinitis   . Allergy     SEASONAL  . Asthma   . Eczema   . Environmental allergies    Age 24 years  . Fatty liver 12/11/2018   Noted on CT 6/20  . Generalized headaches    Began at age 62 years  . GERD (gastroesophageal reflux disease)   . Influenza    Age 76 years  . 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 Streptococcal infection(041.00)    Age 26 and 24 years old  . TB lung, latent    2017  . 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 TONSILLECTOMY  2012  . WISDOM TOOTH EXTRACTION      Family History  Problem Relation Age of Onset  . Migraines Paternal Grandmother   . Migraines Paternal Grandfather   . HIV Father   . Diabetes Mother   . Hypertension Mother   . Anemia Mother   . COPD Mother   . CVA Mother   . Congestive Heart Failure Mother   . Cirrhosis Mother   . Obesity Mother   . Irritable bowel syndrome Mother   . Ulcers Brother   . Anemia Sister   . Cholecystitis Brother   . Migraines Paternal Aunt   . Migraines Maternal Uncle        Maternal Great Uncle  . Diabetes Other        Family History  . Hypertension Other        Family History  . Depression Other  Family History  . Asthma Other        Family History  . Allergic rhinitis Other        Family History  . Asthma Brother   . Colon cancer Neg Hx   . Colon polyps Neg Hx   . Esophageal cancer Neg Hx   . Rectal cancer Neg Hx   . Stomach cancer Neg Hx     SOCIAL HX: Non-smoker.  No regular alcohol.   Current Outpatient Medications:  .  BLACK ELDERBERRY,BERRY-FLOWER, PO, Take by mouth., Disp: , Rfl:  .  Hyoscyamine Sulfate SL (LEVSIN/SL) 0.125 MG SUBL, Place 1 tablet under the tongue every 8 (eight) hours as needed., Disp: 10 tablet, Rfl: 0 .  lidocaine (XYLOCAINE) 2 % solution, Use as directed 5 mLs in the mouth or throat every 6 (six) hours as needed for mouth pain., Disp: 100 mL, Rfl: 0 .  methylPREDNISolone (MEDROL DOSEPAK)  4 MG TBPK tablet, Take Tapered dose as directed, Disp: 21 tablet, Rfl: 0 .  Multiple Vitamin (MULTIVITAMIN) tablet, Take 1 tablet by mouth daily., Disp: , Rfl:  .  omeprazole (PRILOSEC) 40 MG capsule, Take 1 capsule (40 mg total) by mouth 2 (two) times daily., Disp: 60 capsule, Rfl: 3  EXAM:  VITALS per patient if applicable:  GENERAL: alert, oriented, appears well and in no acute distress  HEENT: atraumatic, conjunttiva clear, no obvious abnormalities on inspection of external nose and ears  NECK: normal movements of the head and neck  LUNGS: on inspection no signs of respiratory distress, breathing rate appears normal, no obvious gross SOB, gasping or wheezing  CV: no obvious cyanosis  MS: moves all visible extremities without noticeable abnormality  PSYCH/NEURO: pleasant and cooperative, no obvious depression or anxiety, speech and thought processing grossly intact  ASSESSMENT AND PLAN:  Discussed the following assessment and plan:  Throat pain - Plan: Ambulatory referral to ENT   Patient relates several month history of mostly right-sided neck pain with abnormal sensation with swallowing.  EGD unrevealing.  She has been treated for possible GERD related symptoms with omeprazole without any improvement.  She does not have any red flags such as weight loss, loss of appetite, persistent adenopathy, etc.  -Recommend ENT referral for further evaluation.  If that is unrevealing and she continues to have abnormal sensation swallowing could consider modified barium swallow study     I discussed the assessment and treatment plan with the patient. The patient was provided an opportunity to ask questions and all were answered. The patient agreed with the plan and demonstrated an understanding of the instructions.   The patient was advised to call back or seek an in-person evaluation if the symptoms worsen or if the condition fails to improve as anticipated.     Evelena Peat, MD

## 2019-12-13 ENCOUNTER — Ambulatory Visit (HOSPITAL_COMMUNITY)
Admission: EM | Admit: 2019-12-13 | Discharge: 2019-12-13 | Disposition: A | Discharge status: Home / Self Care | Payer: No Typology Code available for payment source | Attending: Urgent Care | Admitting: Urgent Care

## 2019-12-13 ENCOUNTER — Other Ambulatory Visit: Payer: Self-pay

## 2019-12-13 ENCOUNTER — Encounter (HOSPITAL_COMMUNITY): Payer: Self-pay | Admitting: Emergency Medicine

## 2019-12-13 DIAGNOSIS — R0789 Other chest pain: Secondary | ICD-10-CM

## 2019-12-13 MED ORDER — MELOXICAM 7.5 MG PO TABS: 7.5000 mg | ORAL_TABLET | Freq: Every day | ORAL | 0 refills | Status: DC

## 2019-12-13 MED ORDER — TIZANIDINE HCL 4 MG PO TABS: 4.0000 mg | ORAL_TABLET | Freq: Every day | ORAL | 0 refills | Status: DC

## 2019-12-13 NOTE — ED Provider Notes (Signed)
[facility]   MRN: [id number] DOB: January 24, 1996  Subjective:   Maria Smith is a 24 y.o. female presenting for 3-day history of acute onset persistent mid chest pain.  Patient denies falls, trauma, fever, shortness of breath.  She does admit that taking a deep breath can hurt.  She does a lot of heavy lifting and strenuous work activities.  Has had extensive work-up in the past year with normal thyroid levels, recently c-Met, CBC and lipase.  She does have a PCP.  No current facility-administered medications for this encounter.  Current Outpatient Medications:  .  BLACK ELDERBERRY,BERRY-FLOWER, PO, Take by mouth., Disp: , Rfl:  .  Hyoscyamine Sulfate SL (LEVSIN/SL) 0.125 MG SUBL, Place 1 tablet under the tongue every 8 (eight) hours as needed., Disp: 10 tablet, Rfl: 0 .  lidocaine (XYLOCAINE) 2 % solution, Use as directed 5 mLs in the mouth or throat every 6 (six) hours as needed for mouth pain., Disp: 100 mL, Rfl: 0 .  methylPREDNISolone (MEDROL DOSEPAK) 4 MG TBPK tablet, Take Tapered dose as directed, Disp: 21 tablet, Rfl: 0 .  Multiple Vitamin (MULTIVITAMIN) tablet, Take 1 tablet by mouth daily., Disp: , Rfl:  .  omeprazole (PRILOSEC) 40 MG capsule, Take 1 capsule (40 mg total) by mouth 2 (two) times daily., Disp: 60 capsule, Rfl: 3   No Known Allergies  Past Medical History:  Diagnosis Date  . Allergic rhinitis   . Allergy    SEASONAL  . Asthma   . Eczema   . Environmental allergies    Age 63 years  . Fatty liver 12/11/2018   Noted on CT 6/20  . Generalized headaches    Began at age 68 years  . GERD (gastroesophageal reflux disease)   . Influenza    Age 26 years  . 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 Streptococcal infection(041.00)    Age 74 and 24 years old  . TB lung, latent    2017  . 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/2018   Diffuse enlargement on [place] 6/20     Past Surgical History:  Procedure Laterality Date  . TONSILLECTOMY  2012  . WISDOM  TOOTH EXTRACTION      Family History  Problem Relation Age of Onset  . Migraines Paternal Grandmother   . Migraines Paternal Grandfather   . HIV Father   . Diabetes Mother   . Hypertension Mother   . Anemia Mother   . COPD Mother   . CVA Mother   . Congestive Heart Failure Mother   . Cirrhosis Mother   . Obesity Mother   . Irritable bowel syndrome Mother   . Ulcers Brother   . Anemia Sister   . Cholecystitis Brother   . Migraines Paternal Aunt   . Migraines Maternal Uncle        Maternal Great Uncle  . Diabetes Other        Family History  . Hypertension Other        Family History  . Depression Other        Family History  . Asthma Other        Family History  . Allergic rhinitis Other        Family History  . Asthma Brother   . Colon cancer Neg Hx   . Colon polyps Neg Hx   . Esophageal cancer Neg Hx   . Rectal cancer Neg Hx   . Stomach cancer Neg Hx  Social History   Tobacco Use  . Smoking status: Never Smoker  . Smokeless tobacco: Never Used  Vaping Use  . Vaping Use: Never used  Substance Use Topics  . Alcohol use: Not Currently  . Drug use: No    ROS   Objective:   Vitals: BP 109/75   Pulse 81   Resp 16   LMP 12/06/2019   SpO2 98%   Physical Exam Constitutional:      General: She is not in acute distress.    Appearance: Normal appearance. She is well-developed. She is not ill-appearing, toxic-appearing or diaphoretic.  HENT:     Head: Normocephalic and atraumatic.     Nose: Nose normal.     Mouth/Throat:     Mouth: Mucous membranes are moist.  Eyes:     Extraocular Movements: Extraocular movements intact.     Pupils: Pupils are equal, round, and reactive to light.  Cardiovascular:     Rate and Rhythm: Normal rate and regular rhythm.     Pulses: Normal pulses.     Heart sounds: Normal heart sounds. No murmur heard.  No friction rub. No gallop.   Pulmonary:     Effort: Pulmonary effort is normal. No respiratory distress.      Breath sounds: Normal breath sounds. No stridor. No wheezing, rhonchi or rales.  Chest:     Chest wall: Tenderness (Reproducible over area outlined) present.    Skin:    General: Skin is warm and dry.     Findings: No rash.  Neurological:     Mental Status: She is alert and oriented to person, place, and time.  Psychiatric:        Mood and Affect: Mood normal.        Behavior: Behavior normal.        Thought Content: Thought content normal.     ED ECG REPORT   Date: 12/13/2019  Rate: 74bpm  Rhythm: normal sinus rhythm  QRS Axis: normal  Intervals: normal  ST/T Wave abnormalities: nonspecific T wave changes  Conduction Disutrbances:none  Narrative Interpretation: Nonspecific T wave inversion in lead V2, otherwise sinus rhythm at 74 bpm and apart from the T wave inversion in V2 is exactly the same as previous EKGs from March 2021 and October 2020.  Old EKG Reviewed: changes noted  I have personally reviewed the EKG tracing and agree with the computerized printout as noted.   Assessment and Plan :   PDMP not reviewed this encounter.  1. Atypical chest pain   2. Chest wall pain     Suspect musculoskeletal, chest wall pain related to the nature of her work.  Recommended meloxicam and tizanidine.  Emphasized need for follow-up with PCP given her extensive medical history.  Also recommended she discuss modification of her work tasks to help in her recovery.  This may require follow-up with her PCP as well given her FMLA.  Counseled patient on potential for adverse effects with medications prescribed/recommended today, ER and return-to-clinic precautions discussed, patient verbalized understanding.    Jaynee Eagles, PA-C 12/13/19 1747

## 2019-12-13 NOTE — ED Triage Notes (Signed)
Intermittent chest pain for 3 days. Stabbing pain over left chest, sometimes feels an "odd" sensation in left arm with pain. Denies nausea, diaphoresis, shortness of breath. Pain is sometimes worse when taking a deep breath. Intermittent palpitations as well. History of asthma and acid reflux.

## 2019-12-13 NOTE — Discharge Instructions (Addendum)
Please make sure that you follow-up with your primary care provider regarding your chest pain.  Talk with your employer about changing your work responsibilities to allow for rest of your chest wall. This includes light duty, lifting weight lower than 10lbs, limiting strenuous physical activities.

## 2019-12-14 ENCOUNTER — Telehealth: Payer: No Typology Code available for payment source | Admitting: Family Medicine

## 2019-12-15 ENCOUNTER — Telehealth: Payer: Self-pay | Admitting: Family Medicine

## 2019-12-15 NOTE — Telephone Encounter (Signed)
Pt stated she would like to come in office instead of virtual tomorrow 6/30 at 11:30 am. She was told by the ED doctor that her heart imaging was normal but there was an abnormality listed on her results so the pt is confused. She also will be needing to speak to [facility] about short term disability b/c the ED doctor stated she may want to be out of work if it is a pulled/strained muscle. Pt stated she has a family history of heart problems/conditions and is very concerned. She would rather discuss with her PCP in office if possible. Pt will need a call back before 11:00am tomorrow to verify if it will be in office or televisit    Pt can be reached at [phone]

## 2019-12-16 ENCOUNTER — Ambulatory Visit: Payer: No Typology Code available for payment source | Admitting: Obstetrics & Gynecology

## 2019-12-16 ENCOUNTER — Telehealth: Payer: Self-pay | Admitting: Cardiology

## 2019-12-16 ENCOUNTER — Encounter: Payer: Self-pay | Admitting: Family Medicine

## 2019-12-16 ENCOUNTER — Ambulatory Visit (INDEPENDENT_AMBULATORY_CARE_PROVIDER_SITE_OTHER): Payer: No Typology Code available for payment source | Admitting: Family Medicine

## 2019-12-16 ENCOUNTER — Telehealth: Payer: No Typology Code available for payment source | Admitting: Family Medicine

## 2019-12-16 ENCOUNTER — Other Ambulatory Visit: Payer: Self-pay

## 2019-12-16 VITALS — BP 108/70 | HR 73 | Temp 97.6°F | Wt 104.0 lb

## 2019-12-16 DIAGNOSIS — R079 Chest pain, unspecified: Secondary | ICD-10-CM | POA: Diagnosis not present

## 2019-12-16 DIAGNOSIS — R634 Abnormal weight loss: Secondary | ICD-10-CM | POA: Diagnosis not present

## 2019-12-16 DIAGNOSIS — R682 Dry mouth, unspecified: Secondary | ICD-10-CM | POA: Diagnosis not present

## 2019-12-16 DIAGNOSIS — R1312 Dysphagia, oropharyngeal phase: Secondary | ICD-10-CM

## 2019-12-16 NOTE — Patient Instructions (Signed)
Costochondritis  Costochondritis is swelling and irritation (inflammation) of the tissue (cartilage) that connects your ribs to your breastbone (sternum). This causes pain in the front of your chest. The pain usually starts gradually and involves more than one rib. What are the causes? The exact cause of this condition is not always known. It results from stress on the cartilage where your ribs attach to your sternum. The cause of this stress could be:  Chest injury (trauma).  Exercise or activity, such as lifting.  Severe coughing. What increases the risk? You may be at higher risk for this condition if you:  Are female.  Are 30?24 years old.  Recently started a new exercise or work activity.  Have low levels of vitamin D.  Have a condition that makes you cough frequently. What are the signs or symptoms? The main symptom of this condition is chest pain. The pain:  Usually starts gradually and can be sharp or dull.  Gets worse with deep breathing, coughing, or exercise.  Gets better with rest.  May be worse when you press on the sternum-rib connection (tenderness). How is this diagnosed? This condition is diagnosed based on your symptoms, medical history, and a physical exam. Your health care provider will check for tenderness when pressing on your sternum. This is the most important finding. You may also have tests to rule out other causes of chest pain. These may include:  A chest X-ray to check for lung problems.  An electrocardiogram (ECG) to see if you have a heart problem that could be causing the pain.  An imaging scan to rule out a chest or rib fracture. How is this treated? This condition usually goes away on its own over time. Your health care provider may prescribe an NSAID to reduce pain and inflammation. Your health care provider may also suggest that you:  Rest and avoid activities that make pain worse.  Apply heat or cold to the area to reduce pain and  inflammation.  Do exercises to stretch your chest muscles. If these treatments do not help, your health care provider may inject a numbing medicine at the sternum-rib connection to help relieve the pain. Follow these instructions at home:  Avoid activities that make pain worse. This includes any activities that use chest, abdominal, and side muscles.  If directed, put ice on the painful area: ? Put ice in a plastic bag. ? Place a towel between your skin and the bag. ? Leave the ice on for 20 minutes, 2-3 times a day.  If directed, apply heat to the affected area as often as told by your health care provider. Use the heat source that your health care provider recommends, such as a moist heat pack or a heating pad. ? Place a towel between your skin and the heat source. ? Leave the heat on for 20-30 minutes. ? Remove the heat if your skin turns bright red. This is especially important if you are unable to feel pain, heat, or cold. You may have a greater risk of getting burned.  Take over-the-counter and prescription medicines only as told by your health care provider.  Return to your normal activities as told by your health care provider. Ask your health care provider what activities are safe for you.  Keep all follow-up visits as told by your health care provider. This is important. Contact a health care provider if:  You have chills or a fever.  Your pain does not go away or it gets   worse.  You have a cough that does not go away (is persistent). Get help right away if:  You have shortness of breath. This information is not intended to replace advice given to you by your health care provider. Make sure you discuss any questions you have with your health care provider. Document Revised: 06/19/2017 Document Reviewed: 09/28/2015 Elsevier Patient Education  2020 [employer]  Nonspecific Chest Pain, Adult Chest pain can be caused by many different conditions. It can be caused by a  condition that is life-threatening and requires treatment right away. It can also be caused by something that is not life-threatening. If you have chest pain, it can be hard to know the difference, so it is important to get help right away to make sure that you do not have a serious condition. Some life-threatening causes of chest pain include:  Heart attack.  A tear in the body's main blood vessel (aortic dissection).  Inflammation around your heart (pericarditis).  A problem in the lungs, such as a blood clot (pulmonary embolism) or a collapsed lung (pneumothorax). Some non life-threatening causes of chest pain include:  Heartburn.  Anxiety or stress.  Damage to the bones, muscles, and cartilage that make up your chest wall.  Pneumonia or bronchitis.  Shingles infection (varicella-zoster virus). Chest pain can feel like:  Pain or discomfort on the surface of your chest or deep in your chest.  Crushing, pressure, aching, or squeezing pain.  Burning or tingling.  Dull or sharp pain that is worse when you move, cough, or take a deep breath.  Pain or discomfort that is also felt in your back, neck, jaw, shoulder, or arm, or pain that spreads to any of these areas. Your chest pain may come and go. It may also be constant. Your health care provider will do lab tests and other studies to find the cause of your pain. Treatment will depend on the cause of your chest pain. Follow these instructions at home: Medicines  Take over-the-counter and prescription medicines only as told by your health care provider.  If you were prescribed an antibiotic, take it as told by your health care provider. Do not stop taking the antibiotic even if you start to feel better. Lifestyle   Rest as directed by your health care provider.  Do not use any products that contain nicotine or tobacco, such as cigarettes and e-cigarettes. If you need help quitting, ask your health care provider.  Do not  drink alcohol.  Make healthy lifestyle choices as recommended. These may include: ? Getting regular exercise. Ask your health care provider to suggest some activities that are safe for you. ? Eating a heart-healthy diet. This includes plenty of fresh fruits and vegetables, whole grains, low-fat (lean) protein, and low-fat dairy products. A dietitian can help you find healthy eating options. ? Maintaining a healthy weight. ? Managing any other health conditions you have, such as high blood pressure (hypertension) or diabetes. ? Reducing stress, such as with yoga or relaxation techniques. General instructions  Pay attention to any changes in your symptoms. Tell your health care provider about them or any new symptoms.  Avoid any activities that cause chest pain.  Keep all follow-up visits as told by your health care provider. This is important. This includes visits for any further testing if your chest pain does not go away. Contact a health care provider if:  Your chest pain does not go away.  You feel depressed.  You have a fever.  Get help right away if:  Your chest pain gets worse.  You have a cough that gets worse, or you cough up blood.  You have severe pain in your abdomen.  You faint.  You have sudden, unexplained chest discomfort.  You have sudden, unexplained discomfort in your arms, back, neck, or jaw.  You have shortness of breath at any time.  You suddenly start to sweat, or your skin gets clammy.  You feel nausea or you vomit.  You suddenly feel lightheaded or dizzy.  You have severe weakness, or unexplained weakness or fatigue.  Your heart begins to beat quickly, or it feels like it is skipping beats. These symptoms may represent a serious problem that is an emergency. Do not wait to see if the symptoms will go away. Get medical help right away. Call your local emergency services (911 in the [place] Do not drive yourself to the hospital. Summary  Chest  pain can be caused by a condition that is serious and requires urgent treatment. It may also be caused by something that is not life-threatening.  If you have chest pain, it is very important to see your health care provider. Your health care provider may do lab tests and other studies to find the cause of your pain.  Follow your health care provider's instructions on taking medicines, making lifestyle changes, and getting emergency treatment if symptoms become worse.  Keep all follow-up visits as told by your health care provider. This includes visits for any further testing if your chest pain does not go away. This information is not intended to replace advice given to you by your health care provider. Make sure you discuss any questions you have with your health care provider. Document Revised: 12/05/2017 Document Reviewed: 12/05/2017 Elsevier Patient Education  2020 [employer]  Dysphagia  Dysphagia is trouble swallowing. This condition occurs when solids and liquids stick in a person's throat on the way down to the stomach, or when food takes longer to get to the stomach than usual. You may have problems swallowing food, liquids, or both. You may also have pain while trying to swallow. It may take you more time and effort to swallow something. What are the causes? This condition may be caused by:  Muscle problems. They may make it difficult for you to move food and liquids through the esophagus, which is the tube that connects your mouth to your stomach.  Blockages. You may have ulcers, scar tissue, or inflammation that blocks the normal passage of food and liquids. Causes of these problems include: ? Acid reflux from your stomach into your esophagus (gastroesophageal reflux). ? Infections. ? Radiation treatment for cancer. ? Medicines taken without enough fluids to wash them down into your stomach.  Stroke. This can affect the nerves and make it difficult to swallow.  Nerve  problems. These prevent signals from being sent to the muscles of your esophagus to squeeze (contract) and move what you swallow down to your stomach.  Globus pharyngeus. This is a common problem that involves a feeling like something is stuck in your throat or a sense of trouble with swallowing, even though nothing is wrong with the swallowing passages.  Certain conditions, such as cerebral palsy or Parkinson's disease. What are the signs or symptoms? Common symptoms of this condition include:  A feeling that solids or liquids are stuck in your throat on the way down to the stomach.  Pain while swallowing.  Coughing or gagging while trying to swallow.  Other symptoms include:  Food moving back from your stomach to your mouth (regurgitation).  Noises coming from your throat.  Chest discomfort with swallowing.  A feeling of fullness when swallowing.  Drooling, especially when the throat is blocked.  Heartburn. How is this diagnosed? This condition may be diagnosed by:  Barium X-ray. In this test, you will swallow a white liquid that sticks to the inside of your esophagus. X-ray images are then taken.  Endoscopy. In this test, a flexible telescope is inserted down your throat to look at your esophagus and your stomach.  CT scans and an MRI. How is this treated? Treatment for dysphagia depends on the cause of this condition, such as:  If the dysphagia is caused by acid reflux or infection, medicines may be used. They may include antibiotics and heartburn medicines.  If the dysphagia is caused by problems with the muscles, swallowing therapy may be used to help you strengthen your swallowing muscles. You may have to do specific exercises to strengthen the muscles or stretch them.  If the dysphagia is caused by a blockage or mass, procedures to remove the blockage may be done. You may need surgery and a feeding tube. You may need to make diet changes. Ask your health care provider  for specific instructions. Follow these instructions at home: Medicines  Take over-the-counter and prescription medicines only as told by your health care provider.  If you were prescribed an antibiotic medicine, take it as told by your health care provider. Do not stop taking the antibiotic even if you start to feel better. Eating and drinking   Follow any diet changes as told by your health care provider.  Work with a diet and nutrition specialist (dietitian) to create an eating plan that will help you get the nutrients you need in order to stay healthy.  Eat soft foods that are easier to swallow.  Cut your food into small pieces and eat slowly. Take small bites.  Eat and drink only when you are sitting upright.  Do not drink alcohol or caffeine. If you need help quitting, ask your health care provider. General instructions  Check your weight every day to make sure you are not losing weight.  Do not use any products that contain nicotine or tobacco, such as cigarettes, e-cigarettes, and chewing tobacco. If you need help quitting, ask your health care provider.  Keep all follow-up visits as told by your health care provider. This is important. Contact a health care provider if you:  Lose weight because you cannot swallow.  Cough when you drink liquids.  Cough up partially digested food. Get help right away if you:  Cannot swallow your saliva.  Have shortness of breath, a fever, or both.  Have a hoarse voice and also have trouble swallowing. Summary  Dysphagia is trouble swallowing. This condition occurs when solids and liquids stick in a person's throat on the way down to the stomach. You may cough or gag while trying to swallow.  Dysphagia has many possible causes.  Treatment for dysphagia depends on the cause of the condition.  Keep all follow-up visits as told by your health care provider. This is important. This information is not intended to replace advice  given to you by your health care provider. Make sure you discuss any questions you have with your health care provider. Document Revised: 10/29/2018 Document Reviewed: 10/29/2018 Elsevier Patient Education  2020 [employer].

## 2019-12-16 NOTE — Telephone Encounter (Signed)
The patient is calling in because she is concerned about chest pain that she has been experiencing. She was seen in the ED on 6/27 for chest pain. All cardiac concerned were ruled out. EKG showed NSR. She was told to follow up with her PCP. Pt denies any SOB or swelling. She states that she thinks she may have pulled a muscle. The patient has an appointment with her PCP this morning. I advise the patient to keep that appointment and go to the ED if her symptoms worsen. She verbalized understanding.

## 2019-12-16 NOTE — Telephone Encounter (Signed)
Pt c/o of Chest Pain: STAT if CP now or developed within 24 hours  1. Are you having CP right now? yes  2. Are you experiencing any other symptoms (ex. SOB, nausea, vomiting, sweating)? Aches in left arm, headaches on left side of head last night  3. How long have you been experiencing CP? Few weeks  4. Is your CP continuous or coming and going? Comes and goes  5. Have you taken Nitroglycerin? no   Patient states her chest pain is more of an ache now and is not sharp anymore. She states she went to the hospital and had an abnormal EKG. She states they told her she may have had a possible heart attack in the past and did not explain anything further. She is very concerned.

## 2019-12-16 NOTE — Telephone Encounter (Signed)
Message nancy this morning, ok to come in office per Harriett Sine

## 2019-12-16 NOTE — Progress Notes (Signed)
Subjective:    Patient ID: Maria Smith, female    DOB: 1996/03/08, 24 y.o.   MRN: [id number]  No chief complaint on file.   HPI Patient was seen today for follow-up.  Pt seen in UC on 6/27 for chest pain.  EKG with nonspecific changes seen on previous EKGs.  Other tests normal.  Pt given Mobic and Zanaflex which she has not started yet.  Pt endorses continued chest pain that feels like a pulling/muscle strain sensation.  Pt endorses doing heavy lifting at home and at work  Pt also sore throat and dry mouth.  Pt sensation of something stuck When eating.  Sensation causes pt not to eat much.  Drinking boost supplemental shakes but notes weight loss.  Has an appointment with ENT in a few weeks.  GI referral made but appointment not until August.  Pt denies fever, chills, hoarse voice, acid taste in mouth.  Past Medical History:  Diagnosis Date  . Allergic rhinitis   . Allergy    SEASONAL  . Asthma   . Eczema   . Environmental allergies    Age 20 years  . Fatty liver 12/11/2018   Noted on CT 6/20  . Generalized headaches    Began at age 19 years  . GERD (gastroesophageal reflux disease)   . Influenza    Age 57 years  . 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 Streptococcal infection(041.00)    Age 63 and 24 years old  . TB lung, latent    2017  . 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/2018   Diffuse enlargement on [place] 6/20    No Known Allergies  ROS General: Denies fever, chills, night sweats, changes in weight, changes in appetite  + weight loss HEENT: Denies headaches, ear pain, changes in vision, rhinorrhea  +dysphagia sore throat CV: Denies CP, palpitations, SOB, orthopnea  + chest pain Pulm: Denies SOB, cough, wheezing GI: Denies abdominal pain, nausea, vomiting, diarrhea, constipation GU: Denies dysuria, hematuria, frequency, vaginal discharge Msk: Denies muscle cramps, joint pains Neuro: Denies weakness, numbness, tingling Skin: Denies rashes, bruising Psych:  Denies depression, anxiety, hallucinations     Objective:    Blood pressure 108/70, pulse 73, temperature 97.6 F (36.4 C), temperature source Temporal, weight 104 lb (47.2 kg), last menstrual period 12/06/2019, SpO2 99 %.  Gen. Pleasant, well-nourished, in no distress, normal affect   HEENT: [place]/AT, face symmetric, conjunctiva clear, no scleral icterus, PERRLA, EOMI, nares patent without drainage, without erythema or exudate.  Tonsils absent. Lungs: no accessory muscle use, CTAB, no wheezes or rales Cardiovascular: RRR, no m/r/g, no peripheral edema Musculoskeletal: TTP of R upper chest, left upper chest, and sternum. No deformities, no cyanosis or clubbing, normal tone Neuro:  A&Ox3, CN II-XII intact, normal gait Skin:  Warm, no lesions/ rash   Wt Readings from Last 3 Encounters:  11/10/19 108 lb (49 kg)  10/23/19 109 lb 6.4 oz (49.6 kg)  10/07/19 108 lb (49 kg)    Lab Results  Component Value Date   WBC 6.2 10/23/2019   HGB 12.8 10/23/2019   HCT 40.2 10/23/2019   PLT 324 10/23/2019   GLUCOSE 94 10/23/2019   CHOL 167 06/26/2017   TRIG 32 06/26/2017   HDL 94 06/26/2017   LDLCALC 67 06/26/2017   ALT 18 10/23/2019   AST 23 10/23/2019   NA 137 10/23/2019   K 3.5 10/23/2019   CL 106 10/23/2019   CREATININE 0.77 10/23/2019   BUN <5 (L)  10/23/2019   CO2 23 10/23/2019   TSH 2.14 01/28/2019    Assessment/Plan:  Nonspecific chest pain -Results from [facility] reviewed and reassuring -Discussed EKG results of nonspecific T wave change in the V2 as seen on from previous studies from 09/05/2019 and 04/06/2020 -Discussed likely causes including costochondritis versus pectoral muscle strain given reproducible symptoms on exam -Discussed supportive care including NSAIDs, heat, rest, stretching.  Consider Lidoderm patch -Given precautions -Patient considering short-term leave from work 2/2 pain/discomfort -Also discussed PT. Pt to decide -Continue to monitor  Oropharyngeal  dysphagia -X-ray neck soft tissue negative on 11/10/19 -Follow-up appointment with ENT and GI encouraged. -Given handout  Weight loss -Likely 2/2 dysphagia.  Encouraged to keep follow-up appointments with ENT and GI -Patient encouraged to eat small meals regularly -Okay to continue boost/Ensure supplemental drinks  Dry mouth -Discussed possible causes including medications, autoimmune disorder such as Sjogren's -Consider biotin over-the-counter dry mouth rinse.  F/u as needed  Abbe Amsterdam, MD

## 2019-12-30 ENCOUNTER — Ambulatory Visit (INDEPENDENT_AMBULATORY_CARE_PROVIDER_SITE_OTHER): Payer: No Typology Code available for payment source | Admitting: Otolaryngology

## 2019-12-30 ENCOUNTER — Other Ambulatory Visit: Payer: Self-pay

## 2019-12-30 ENCOUNTER — Encounter (INDEPENDENT_AMBULATORY_CARE_PROVIDER_SITE_OTHER): Payer: Self-pay | Admitting: Otolaryngology

## 2019-12-30 VITALS — Temp 97.3°F

## 2019-12-30 DIAGNOSIS — R070 Pain in throat: Secondary | ICD-10-CM | POA: Diagnosis not present

## 2019-12-30 NOTE — Progress Notes (Signed)
HPI: Maria Smith is a 24 y.o. female who presents is referred by Dr. Caryl Never for evaluation of chronic throat complaints.  Patient states that she has had chronic pain on both sides of her throat but more so on the right side over the past couple of months.  She has some difficulty swallowing.  She has been seen in the [facility] ED and had a x-ray of her neck which was normal.  She has been prescribed omeprazole and is presently taking omeprazole.  She had her tonsils removed 9 years ago.  She is having no hoarseness. She felt like this initially began when something went down her throat the wrong way but does not feel like it is gotten any better..  Past Medical History:  Diagnosis Date  . Allergic rhinitis   . Allergy    SEASONAL  . Asthma   . Eczema   . Environmental allergies    Age 4 years  . Fatty liver 12/11/2018   Noted on CT 6/20  . Generalized headaches    Began at age 23 years  . GERD (gastroesophageal reflux disease)   . Influenza    Age 83 years  . 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 Streptococcal infection(041.00)    Age 43 and 24 years old  . TB lung, latent    2017  . 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/2018   Diffuse enlargement on [place] 6/20   Past Surgical History:  Procedure Laterality Date  . TONSILLECTOMY  2012  . WISDOM TOOTH EXTRACTION     Social History   Socioeconomic History  . Marital status: Married    Spouse name: Not on file  . Number of children: 0  . Years of education: Not on file  . Highest education level: Associate degree: occupational, Scientist, product/process development, or vocational program  Occupational History  . Occupation: dietary  Tobacco Use  . Smoking status: Never Smoker  . Smokeless tobacco: Never Used  Vaping Use  . Vaping Use: Never used  Substance and Sexual Activity  . Alcohol use: Not Currently  . Drug use: No  . Sexual activity: Yes    Partners: Female    Comment: female partner  Other Topics Concern  . 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    Social Determinants of Health   Financial Resource Strain:   . Difficulty of Paying Living Expenses:   Food Insecurity:   . Worried About Programme researcher, broadcasting/film/video in the Last Year:   . Barista in the Last Year:   Transportation Needs:   . Freight forwarder (Medical):   Marland Kitchen Lack of Transportation (Non-Medical):   Physical Activity:   . Days of Exercise per Week:   . Minutes of Exercise per Session:   Stress:   . Feeling of Stress :   Social Connections:   . Frequency of Communication with Friends and Family:   . Frequency of Social Gatherings with Friends and Family:   . Attends Religious Services:   . Active Member of Clubs or Organizations:   . Attends Banker Meetings:   Marland Kitchen Marital Status:    Family History  Problem Relation Age of Onset  .  Migraines Paternal Grandmother   . Migraines Paternal Grandfather   . HIV Father   . Diabetes Mother   . Hypertension Mother   . Anemia Mother   . COPD Mother   . CVA Mother   . Congestive Heart Failure Mother   . Cirrhosis Mother   . Obesity Mother   . Irritable bowel syndrome Mother   . Ulcers Brother   . Anemia Sister   . Cholecystitis Brother   . Migraines Paternal Aunt   . Migraines Maternal Uncle        Maternal Great Uncle  . Diabetes Other        Family History  . Hypertension Other        Family History  . Depression Other        Family History  . Asthma Other        Family History  . Allergic rhinitis Other        Family History  . Asthma Brother   . Colon cancer Neg Hx   . Colon polyps Neg Hx   . Esophageal cancer Neg Hx   . Rectal cancer Neg Hx   . Stomach cancer Neg Hx    No Known Allergies Prior to Admission medications   Medication Sig Start Date End  Date Taking? Authorizing Provider  BLACK ELDERBERRY,BERRY-FLOWER, PO Take by mouth.   Yes [provider]  Hyoscyamine Sulfate SL (LEVSIN/SL) 0.125 MG SUBL Place 1 tablet under the tongue every 8 (eight) hours as needed. 10/23/19  Yes Elvina Sidle, MD  lidocaine (XYLOCAINE) 2 % solution Use as directed 5 mLs in the mouth or throat every 6 (six) hours as needed for mouth pain. 11/10/19  Yes Joni Reining, PA-C  meloxicam (MOBIC) 7.5 MG tablet Take 1 tablet (7.5 mg total) by mouth daily. 12/13/19  Yes Wallis Bamberg, PA-C  methylPREDNISolone (MEDROL DOSEPAK) 4 MG TBPK tablet Take Tapered dose as directed 11/10/19  Yes Joni Reining, PA-C  Multiple Vitamin (MULTIVITAMIN) tablet Take 1 tablet by mouth daily.   Yes [provider]  omeprazole (PRILOSEC) 40 MG capsule Take 1 capsule (40 mg total) by mouth 2 (two) times daily. 07/03/19  Yes Deeann Saint, MD  tiZANidine (ZANAFLEX) 4 MG tablet Take 1 tablet (4 mg total) by mouth at bedtime. 12/13/19  Yes Wallis Bamberg, PA-C  albuterol (VENTOLIN HFA) 108 (90 Base) MCG/ACT inhaler Inhale 2 puffs into the lungs every 6 (six) hours as needed for wheezing or shortness of breath. Patient not taking: Reported on 10/07/2019 06/03/19 10/23/19  Deeann Saint, MD  levocetirizine (XYZAL) 5 MG tablet TAKE 1 TABLET BY MOUTH EVERY DAY IN THE EVENING Patient not taking: No sig reported 04/15/19 10/23/19  Deeann Saint, MD     Positive ROS: Otherwise negative  All other systems have been reviewed and were otherwise negative with the exception of those mentioned in the HPI and as above.  Physical Exam: Constitutional: Alert, well-appearing, no acute distress.  When patient points with a Q-tip in the area of the throat that it is uncomfortable she points to the area on the right side where the tonsil was located. Ears: External ears without lesions or tenderness. Ear canals are clear bilaterally with intact, clear TMs bilaterally. Nasal: External nose  without lesions. . Clear nasal passages bilaterally. Oral: Lips and gums without lesions.  On exam of the oropharynx patient points to the back oropharyngeal region on the right side with the pain seems to be  located.  The oral mucosa was clear to examination with no mucosal abnormalities noted.  She is status post tonsillectomy which is well-healed.  Palpation of this area with my finger reveals no palpable masses swelling. Indirect laryngoscopy revealed clear hypopharynx and larynx.  Base of tongue on the right side was clear.  This was soft to palpation.  Vocal cords were clear with normal vocal cord mobility bilaterally. Neck: No palpable adenopathy or masses.  No palpable adenopathy in the neck on the right side. Respiratory: Breathing comfortably  Skin: No facial/neck lesions or rash noted.  Procedures  Assessment: Throat pain with normal oropharyngeal and hypopharyngeal examination.  Plan: Suggested initially use of NSAIDs such as ibuprofen 400 to 600mg  twice daily for 1 week to see if this helps.  Can also try throat lozenges. If symptoms do not improve in a week I prescribed Augmentin 875 mg twice daily for 10 days. If throat symptoms persist beyond 3 to 4 weeks could consider further evaluation with CT scan of the neck.  She will call back.    [place], MD   CC:

## 2020-01-15 ENCOUNTER — Telehealth: Payer: Self-pay

## 2020-01-15 NOTE — Telephone Encounter (Signed)
Patient state that she is concerned about getting COVID Vaccine now that her work require everyone to get one. Pt wants advice on if ok to get vaccine despite her medical history.

## 2020-01-15 NOTE — Telephone Encounter (Signed)
Spoke with pt verbalized understanding that Dr Salomon Fick is recommending to take the COVID vaccine

## 2020-01-15 NOTE — Telephone Encounter (Signed)
Ok to get the vaccine.  Would advise her to get it especially with the new babies.

## 2020-02-01 ENCOUNTER — Telehealth: Payer: Self-pay | Admitting: Family Medicine

## 2020-02-01 NOTE — Telephone Encounter (Signed)
The patient called to schedule an appointment for back pain, shoulder and chest pain. She was COVID vaccinated 2 days ago and was wanting to know if this can be a reaction to the vaccination.  I transferred the patient to nurse triage.  Waiting triage notes

## 2020-02-01 NOTE — Telephone Encounter (Signed)
Spoke with pt advised to go to the ED for the chest pains, pt state that she has history of chest muscle pain, state that she feels like its a pulled muscle on her back and shoulder. Pt request for an office appointment, scheduled with Dr Caryl Never for tomorrow at 3.30 pm since Dr Salomon Fick is out of the office until Wed. Pt  verbalized understanding

## 2020-02-03 ENCOUNTER — Ambulatory Visit: Payer: No Typology Code available for payment source | Admitting: Family Medicine

## 2020-02-05 ENCOUNTER — Other Ambulatory Visit: Payer: Self-pay

## 2020-02-05 ENCOUNTER — Encounter: Payer: Self-pay | Admitting: Family Medicine

## 2020-02-05 ENCOUNTER — Ambulatory Visit (INDEPENDENT_AMBULATORY_CARE_PROVIDER_SITE_OTHER): Payer: No Typology Code available for payment source | Admitting: Family Medicine

## 2020-02-05 VITALS — Temp 98.8°F | Wt 101.0 lb

## 2020-02-05 DIAGNOSIS — R002 Palpitations: Secondary | ICD-10-CM | POA: Diagnosis not present

## 2020-02-05 DIAGNOSIS — R634 Abnormal weight loss: Secondary | ICD-10-CM | POA: Diagnosis not present

## 2020-02-05 DIAGNOSIS — R0789 Other chest pain: Secondary | ICD-10-CM

## 2020-02-05 DIAGNOSIS — R1310 Dysphagia, unspecified: Secondary | ICD-10-CM

## 2020-02-05 NOTE — Progress Notes (Signed)
Subjective:    Patient ID: Maria Smith, female    DOB: [birth year], 24 y.o.   MRN: [id number]  No chief complaint on file.   HPI Pt is a 24 yo female with pmh sig for migraines, asthma, fatty liver disease, PCOS, thyroiditis, insomnia, constipation, seasonal allergies who was seen today for ongoing concerns.  Pt seen by ENT and GI however still having dysphagia with solids and at times liquids.  Pt notes choking/gagging sensation when eating.  Pt endorses continued weight loss.  Trying to drink Ensure or boost along with 2 meals per day.  Pt denies nausea, vomiting, diarrhea, constipation.  Pt also notes headache status post first dose of [place] vaccine on 8/13.  Pain at times dull to sharp in the middle of head, worse at night.  Pt has tried ibuprofen for the pain.  Pt still having left upper chest pain noted as a ripping, sharp pain in chest.  Tenderness to palpation of the area and mild edema.  Patient's son and grandson laying his head on her chest is painful.  At times patient having a fluttering sensation in chest/feeling lightheaded.  Occurred recently while sitting.  Heart rate at the time was 44-46 x 2 minutes.  BP has been as low as 85/70.  Past Medical History:  Diagnosis Date  . Allergic rhinitis   . Allergy    SEASONAL  . Asthma   . Eczema   . Environmental allergies    Age 39 years  . Fatty liver 12/11/2018   Noted on CT 6/20  . Generalized headaches    Began at age 20 years  . GERD (gastroesophageal reflux disease)   . Influenza    Age 73 years  . 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 Streptococcal infection(041.00)    Age 39 and 24 years old  . TB lung, latent    2017  . 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/2018   Diffuse enlargement on [place] 6/20    No Known Allergies  ROS General: Denies fever, chills, night sweats, changes in appetite   +changes in weight HEENT: Denies ear pain, changes in vision, rhinorrhea, sore throat  +dysphagia, HA CV: Denies  CP, SOB, orthopnea  +CP, palpitations Pulm: Denies SOB, cough, wheezing GI: Denies abdominal pain, nausea, vomiting, diarrhea, constipation GU: Denies dysuria, hematuria, frequency, vaginal discharge Msk: Denies muscle cramps, joint pains  +CP Neuro: Denies weakness, numbness, tingling Skin: Denies rashes, bruising Psych: Denies depression, anxiety, hallucinations     Objective:    Blood pressure 110/72, pulse 68, temperature 98.8 F (37.1 C), temperature source Oral, weight 101 lb (45.8 kg), SpO2 98 %. Orthostatic VS for the past 72 hrs (Last 3 readings):  Orthostatic BP Patient Position BP Location Cuff Size Orthostatic Pulse  02/05/20 1654 102/70 Standing Right Arm Normal 68  02/05/20 1653 98/68 Sitting Right Arm Normal 70  02/05/20 1652 96/64 Supine Right Arm Normal 61     Gen. Pleasant, well-nourished, in no distress, normal affect   HEENT: [place]/AT, face symmetric, conjunctiva clear, no scleral icterus, PERRLA, EOMI, nares patent without drainage Lungs: no accessory muscle use, CTAB, no wheezes or rales Cardiovascular: RRR, no m/r/g, no peripheral edema Abdomen: BS present, soft, NT/ND, no hepatosplenomegaly. Musculoskeletal: TTP of the left upper chest at sternum, no edema or erythema.  No deformities, no cyanosis or clubbing, normal tone Neuro:  A&Ox3, CN II-XII intact, normal gait Skin:  Warm, no lesions/ rash   Wt Readings from Last 3  Encounters:  02/05/20 101 lb (45.8 kg)  12/16/19 104 lb (47.2 kg)  11/10/19 108 lb (49 kg)    Lab Results  Component Value Date   WBC 6.2 10/23/2019   HGB 12.8 10/23/2019   HCT 40.2 10/23/2019   PLT 324 10/23/2019   GLUCOSE 94 10/23/2019   CHOL 167 06/26/2017   TRIG 32 06/26/2017   HDL 94 06/26/2017   LDLCALC 67 06/26/2017   ALT 18 10/23/2019   AST 23 10/23/2019   NA 137 10/23/2019   K 3.5 10/23/2019   CL 106 10/23/2019   CREATININE 0.77 10/23/2019   BUN <5 (L) 10/23/2019   CO2 23 10/23/2019   TSH 2.14 01/28/2019     Assessment/Plan:  Musculoskeletal chest pain  -Continue supportive care including heat, ice, NSAIDs, topical analgesic -Consider autoimmune d/o as would expect costochondritis to improved by now. - Plan: EKG 12-Lead, CMP with eGFR([facility]), Sedimentation Rate, C-reactive Protein, LUPUS(12) PANEL, C3 and C4, Troponin I, Sjogren's syndrome antibods(ssa + ssb), Troponin I, C3 and C4, LUPUS(12) PANEL, Sjogren's syndrome antibods(ssa + ssb), C-reactive Protein, Sedimentation Rate, CMP with eGFR([facility])  Dysphagia, unspecified type  -stable -Consider possible causes including eosinophilic esophagitis, pharyngeal abscess though would expect lab abnormalities and pharyngitis. -Normal EGD 20/03/2020 -discussed f/u with GI and or ENT -per ENT note consider CT neck for continued symptoms.  Pt hesitant as concerned about radiation.   - Plan: CMP with eGFR([facility]), Sjogren's syndrome antibods(ssa + ssb), Sjogren's syndrome antibods(ssa + ssb), CMP with eGFR([facility])  Weight loss  -Gradual weight loss noted.  Now 101 lbs, was 124 lbs -encouraged to eat small meals/snacks throughout the day.   -ok to drink supplemental shakes - Plan: CMP with eGFR([facility]), CBC with Differential/Platelets, TSH, T4, Free, Sjogren's syndrome antibods(ssa + ssb), Sjogren's syndrome antibods(ssa + ssb), T4, Free, TSH, CBC with Differential/Platelets, CMP with eGFR([facility])  Palpitations  -RRR noted on exam -Discussed possible causes including stress, anxiety, arrhythmia -EKG done this visit NSR with nonspecific ST elevation in V1-2.  EKG from 12/13/2019 reviewed for comparison -Will obtain labs -Given strict precautions -For continued symptoms follow-up with cardiology - Plan: EKG 12-Lead, CMP with eGFR([facility]), CBC with Differential/Platelets, TSH, T4, Free, Troponin I, Sjogren's syndrome antibods(ssa + ssb), Troponin I, Sjogren's syndrome antibods(ssa + ssb), T4, Free, TSH, CBC with Differential/Platelets, CMP with  eGFR([facility])  F/u as needed  Grier Mitts, MD

## 2020-02-05 NOTE — Patient Instructions (Addendum)
We have contacted GI and they should be calling you to in regards to scheduling a sooner appointment.   Chest Wall Pain Chest wall pain is pain in or around the bones and muscles of your chest. Sometimes, an injury causes this pain. Excessive coughing or overuse of arm and chest muscles may also cause chest wall pain. Sometimes, the cause may not be known. This pain may take several weeks or longer to get better. Follow these instructions at home: Managing pain, stiffness, and swelling   If directed, put ice on the painful area: ? Put ice in a plastic bag. ? Place a towel between your skin and the bag. ? Leave the ice on for 20 minutes, 2-3 times per day. Activity  Rest as told by your health care provider.  Avoid activities that cause pain. These include any activities that use your chest muscles or your abdominal and side muscles to lift heavy items. Ask your health care provider what activities are safe for you. General instructions   Take over-the-counter and prescription medicines only as told by your health care provider.  Do not use any products that contain nicotine or tobacco, such as cigarettes, e-cigarettes, and chewing tobacco. These can delay healing after injury. If you need help quitting, ask your health care provider.  Keep all follow-up visits as told by your health care provider. This is important. Contact a health care provider if:  You have a fever.  Your chest pain becomes worse.  You have new symptoms. Get help right away if:  You have nausea or vomiting.  You feel sweaty or light-headed.  You have a cough with mucus from your lungs (sputum) or you cough up blood.  You develop shortness of breath. These symptoms may represent a serious problem that is an emergency. Do not wait to see if the symptoms will go away. Get medical help right away. Call your local emergency services (911 in the [place] Do not drive yourself to the hospital. Summary  Chest  wall pain is pain in or around the bones and muscles of your chest.  Depending on the cause, it may be treated with ice, rest, medicines, and avoiding activities that cause pain.  Contact a health care provider if you have a fever, worsening chest pain, or new symptoms.  Get help right away if you feel light-headed or you develop shortness of breath. These symptoms may be an emergency. This information is not intended to replace advice given to you by your health care provider. Make sure you discuss any questions you have with your health care provider. Document Revised: 12/05/2017 Document Reviewed: 12/05/2017 Elsevier Patient Education  2020 [employer].  Palpitations Palpitations are feelings that your heartbeat is irregular or is faster than normal. It may feel like your heart is fluttering or skipping a beat. Palpitations are usually not a serious problem. They may be caused by many things, including smoking, caffeine, alcohol, stress, and certain medicines or drugs. Most causes of palpitations are not serious. However, some palpitations can be a sign of a serious problem. You may need further tests to rule out serious medical problems. Follow these instructions at home:     Pay attention to any changes in your condition. Take these actions to help manage your symptoms: Eating and drinking  Avoid foods and drinks that may cause palpitations. These may include: ? Caffeinated coffee, tea, soft drinks, diet pills, and energy drinks. ? Chocolate. ? Alcohol. Lifestyle  Take steps to reduce  your stress and anxiety. Things that can help you relax include: ? Yoga. ? Mind-body activities, such as deep breathing, meditation, or using words and images to create positive thoughts (guided imagery). ? Physical activity, such as swimming, jogging, or walking. Tell your health care provider if your palpitations increase with activity. If you have chest pain or shortness of breath with activity,  do not continue the activity until you are seen by your health care provider. ? Biofeedback. This is a method that helps you learn to use your mind to control things in your body, such as your heartbeat.  Do not use drugs, including cocaine or ecstasy. Do not use marijuana.  Get plenty of rest and sleep. Keep a regular bed time. General instructions  Take over-the-counter and prescription medicines only as told by your health care provider.  Do not use any products that contain nicotine or tobacco, such as cigarettes and e-cigarettes. If you need help quitting, ask your health care provider.  Keep all follow-up visits as told by your health care provider. This is important. These may include visits for further testing if palpitations do not go away or get worse. Contact a health care provider if you:  Continue to have a fast or irregular heartbeat after 24 hours.  Notice that your palpitations occur more often. Get help right away if you:  Have chest pain or shortness of breath.  Have a severe headache.  Feel dizzy or you faint. Summary  Palpitations are feelings that your heartbeat is irregular or is faster than normal. It may feel like your heart is fluttering or skipping a beat.  Palpitations may be caused by many things, including smoking, caffeine, alcohol, stress, certain medicines, and drugs.  Although most causes of palpitations are not serious, some causes can be a sign of a serious medical problem.  Get help right away if you faint or have chest pain, shortness of breath, a severe headache, or dizziness. This information is not intended to replace advice given to you by your health care provider. Make sure you discuss any questions you have with your health care provider. Document Revised: 07/17/2017 Document Reviewed: 07/17/2017 Elsevier Patient Education  2020 [employer] Inc.  High-Protein and High-Calorie Diet Eating high-protein and high-calorie foods can help you  to gain weight, heal after an injury, and recover after an illness or surgery. The specific amount of daily protein and calories you need depends on:  Your body weight.  The reason this diet is recommended for you. What is my plan? Generally, a high-protein, high-calorie diet involves:  Eating 250-500 extra calories each day.  Making sure that you get enough of your daily calories from protein. Ask your health care provider how many of your calories should come from protein. Talk with a health care provider, such as a diet and nutrition specialist (dietitian), about how much protein and how many calories you need each day. Follow the diet as directed by your health care provider. What are tips for following this plan?  Preparing meals  Add whole milk, half-and-half, or heavy cream to cereal, pudding, soup, or hot cocoa.  Add whole milk to instant breakfast drinks.  Add peanut butter to oatmeal or smoothies.  Add powdered milk to baked goods, smoothies, or milkshakes.  Add powdered milk, cream, or butter to mashed potatoes.  Add cheese to cooked vegetables.  Make whole-milk yogurt parfaits. Top them with granola, fruit, or nuts.  Add cottage cheese to your fruit.  Add avocado,  cheese, or both to sandwiches or salads.  Add meat, poultry, or seafood to rice, pasta, casseroles, salads, and soups.  Use mayonnaise when making egg salad, chicken salad, or tuna salad.  Use peanut butter as a dip for vegetables or as a topping for pretzels, celery, or crackers.  Add beans to casseroles, dips, and spreads.  Add pureed beans to sauces and soups.  Replace calorie-free drinks with calorie-containing drinks, such as milk and fruit juice.  Replace water with milk or heavy cream when making foods such as oatmeal, pudding, or cocoa. General instructions  Ask your health care provider if you should take a nutritional supplement.  Try to eat six small meals each day instead of three  large meals.  Eat a balanced diet. In each meal, include one food that is high in protein.  Keep nutritious snacks available, such as nuts, trail mixes, dried fruit, and yogurt.  If you have kidney disease or diabetes, talk with your health care provider about how much protein is safe for you. Too much protein may put extra stress on your kidneys.  Drink your calories. Choose high-calorie drinks and have them after your meals. What high-protein foods should I eat?  Vegetables Soybeans. Peas. Grains Quinoa. Bulgur wheat. Meats and other proteins Beef, pork, and poultry. Fish and seafood. Eggs. Tofu. Textured vegetable protein (TVP). Peanut butter. Nuts and seeds. Dried beans. Protein powders. Dairy Whole milk. Whole-milk yogurt. Powdered milk. Cheese. [employer]. Eggnog. Beverages High-protein supplement drinks. Soy milk. Other foods Protein bars. The items listed above may not be a complete list of high-protein foods and beverages. Contact a dietitian for more options. What high-calorie foods should I eat? Fruits Dried fruit. Fruit leather. Canned fruit in syrup. Fruit juice. Avocado. Vegetables Vegetables cooked in oil or butter. Fried potatoes. Grains Pasta. Quick breads. Muffins. Pancakes. Ready-to-eat cereal. Meats and other proteins Peanut butter. Nuts and seeds. Dairy Heavy cream. Whipped cream. Cream cheese. Sour cream. Ice cream. Custard. Pudding. Beverages Meal-replacement beverages. Nutrition shakes. Fruit juice. Sugar-sweetened soft drinks. Seasonings and condiments Salad dressing. Mayonnaise. Alfredo sauce. Fruit preserves or jelly. Honey. Syrup. Sweets and desserts Cake. Cookies. Pie. Pastries. Candy bars. Chocolate. Fats and oils Butter or margarine. Oil. Gravy. Other foods Meal-replacement bars. The items listed above may not be a complete list of high-calorie foods and beverages. Contact a dietitian for more options. Summary  A high-protein,  high-calorie diet can help you gain weight or heal faster after an injury, illness, or surgery.  To increase your protein and calories, add ingredients such as whole milk, peanut butter, cheese, beans, meat, or seafood to meal items.  To get enough extra calories each day, include high-calorie foods and beverages at each meal.  Adding a high-calorie drink or shake can be an easy way to help you get enough calories each day. Talk with your healthcare provider or dietitian about the best options for you. This information is not intended to replace advice given to you by your health care provider. Make sure you discuss any questions you have with your health care provider. Document Revised: 05/17/2017 Document Reviewed: 04/16/2017 Elsevier Patient Education  2020 [employer].

## 2020-02-06 LAB — TROPONIN I: Troponin I: <3 ng/L (ref <=47)

## 2020-02-08 ENCOUNTER — Ambulatory Visit (INDEPENDENT_AMBULATORY_CARE_PROVIDER_SITE_OTHER): Payer: No Typology Code available for payment source | Admitting: Otolaryngology

## 2020-02-08 LAB — CBC WITH DIFFERENTIAL/PLATELET
Absolute Monocytes: 240 cells/uL (ref 200–950)
Basophils Absolute: 20 cells/uL (ref 0–200)
Basophils Relative: 0.4 %
Eosinophils Absolute: 50 cells/uL (ref 15–500)
Eosinophils Relative: 1 %
HCT: 36.6 % (ref 35.0–45.0)
Hemoglobin: 12.2 g/dL (ref 11.7–15.5)
Lymphs Abs: 1800 cells/uL (ref 850–3900)
MCH: 28.6 pg (ref 27.0–33.0)
MCHC: 33.3 g/dL (ref 32.0–36.0)
MCV: 85.7 fL (ref 80.0–100.0)
MPV: 10 fL (ref 7.5–12.5)
Monocytes Relative: 4.8 %
Neutro Abs: 2890 cells/uL (ref 1500–7800)
Neutrophils Relative %: 57.8 %
Platelets: 288 10*3/uL (ref 140–400)
RBC: 4.27 10*6/uL (ref 3.80–5.10)
RDW: 12.6 % (ref 11.0–15.0)
Total Lymphocyte: 36 %
WBC: 5 10*3/uL (ref 3.8–10.8)

## 2020-02-08 LAB — C-REACTIVE PROTEIN: CRP: 0.5 mg/L (ref <8.0)

## 2020-02-08 LAB — COMPLETE METABOLIC PANEL WITH GFR
AG Ratio: 1.9 (calc) (ref 1.0–2.5)
ALT: 13 U/L (ref 6–29)
AST: 19 U/L (ref 10–30)
Albumin: 4.5 g/dL (ref 3.6–5.1)
Alkaline phosphatase (APISO): 41 U/L (ref 31–125)
BUN: 7 mg/dL (ref 7–25)
CO2: 26 mmol/L (ref 20–32)
Calcium: 9.4 mg/dL (ref 8.6–10.2)
Chloride: 104 mmol/L (ref 98–110)
Creat: 0.78 mg/dL (ref 0.50–1.10)
GFR, Est African American: 123 mL/min/{1.73_m2} (ref >=60)
GFR, Est Non African American: 106 mL/min/{1.73_m2} (ref >=60)
Globulin: 2.4 g/dL (calc) (ref 1.9–3.7)
Glucose, Bld: 79 mg/dL (ref 65–99)
Potassium: 3.8 mmol/L (ref 3.5–5.3)
Sodium: 139 mmol/L (ref 135–146)
Total Bilirubin: 0.9 mg/dL (ref 0.2–1.2)
Total Protein: 6.9 g/dL (ref 6.1–8.1)

## 2020-02-08 LAB — SJOGREN'S SYNDROME ANTIBODS(SSA + SSB)
SSA (Ro) (ENA) Antibody, IgG: <1 AI
SSB (La) (ENA) Antibody, IgG: <1 AI

## 2020-02-08 LAB — TSH: TSH: 1.23 mIU/L

## 2020-02-08 LAB — T4, FREE: Free T4: 1.3 ng/dL (ref 0.8–1.8)

## 2020-02-08 LAB — SEDIMENTATION RATE: Sed Rate: 2 mm/h (ref 0–20)

## 2020-02-09 LAB — LUPUS(12) PANEL
Anti Nuclear Antibody (ANA): POSITIVE — AB
C3 Complement: 107 mg/dL (ref 83–193)
C4 Complement: 22 mg/dL (ref 15–57)
ENA SM Ab Ser-aCnc: <1 AI
Rheumatoid fact SerPl-aCnc: <14 IU/mL (ref <14)
Ribosomal P Protein Ab: <1 AI
SM/RNP: <1 AI
SSA (Ro) (ENA) Antibody, IgG: <1 AI
SSB (La) (ENA) Antibody, IgG: <1 AI
Scleroderma (Scl-70) (ENA) Antibody, IgG: <1 AI
Thyroperoxidase Ab SerPl-aCnc: 2 IU/mL (ref <9)
ds DNA Ab: 1 IU/mL

## 2020-02-09 LAB — ANTI-NUCLEAR AB-TITER (ANA TITER): ANA Titer 1: 1:80 {titer} — ABNORMAL HIGH

## 2020-02-10 ENCOUNTER — Other Ambulatory Visit: Payer: Self-pay

## 2020-02-10 ENCOUNTER — Encounter: Payer: Self-pay | Admitting: Emergency Medicine

## 2020-02-10 ENCOUNTER — Other Ambulatory Visit: Payer: Self-pay | Admitting: Family Medicine

## 2020-02-10 ENCOUNTER — Ambulatory Visit (INDEPENDENT_AMBULATORY_CARE_PROVIDER_SITE_OTHER): Payer: No Typology Code available for payment source | Admitting: Emergency Medicine

## 2020-02-10 DIAGNOSIS — K219 Gastro-esophageal reflux disease without esophagitis: Secondary | ICD-10-CM | POA: Diagnosis not present

## 2020-02-10 DIAGNOSIS — J4520 Mild intermittent asthma, uncomplicated: Secondary | ICD-10-CM

## 2020-02-10 DIAGNOSIS — J309 Allergic rhinitis, unspecified: Secondary | ICD-10-CM | POA: Insufficient documentation

## 2020-02-10 DIAGNOSIS — J301 Allergic rhinitis due to pollen: Secondary | ICD-10-CM | POA: Diagnosis not present

## 2020-02-10 DIAGNOSIS — R7611 Nonspecific reaction to tuberculin skin test without active tuberculosis: Secondary | ICD-10-CM

## 2020-02-10 DIAGNOSIS — J45909 Unspecified asthma, uncomplicated: Secondary | ICD-10-CM | POA: Insufficient documentation

## 2020-02-10 DIAGNOSIS — R768 Other specified abnormal immunological findings in serum: Secondary | ICD-10-CM

## 2020-02-10 MED ORDER — LORATADINE 10 MG PO TABS: 10.0000 mg | ORAL_TABLET | Freq: Every day | ORAL | 11 refills | Status: DC

## 2020-02-10 MED ORDER — FLUTICASONE PROPIONATE 50 MCG/ACT NA SUSP: 2.0000 | Freq: Every day | NASAL | 2 refills | Status: DC

## 2020-02-10 NOTE — Patient Instructions (Signed)
Please start loratadine 10 mg once daily Please start fluticasone nasal spray, 2 sprays each nostril once daily. Keep your albuterol available to use 2 puffs when you need it for shortness of breath, chest tightness or wheezing. Keep track of how often you use the albuterol so that we can discuss next time. We will perform full pulmonary function testing in next office visit. Follow with Dr. Delton Coombes next available with full pulmonary function testing on the same day.

## 2020-02-10 NOTE — Assessment & Plan Note (Signed)
Treated for latent TB

## 2020-02-10 NOTE — Progress Notes (Signed)
Subjective:    Patient ID: Maria Smith, female    DOB: 06/03/1996, 24 y.o.   MRN: [id number]  HPI 24 year old never smoker with a history of environmental allergies, seasonal allergies, eczema, GERD.  Latent TB, treated.  She carries a history of asthma that was made in childhood, spirometry 11/26/2018 reviewed by me shows mild obstruction. She has albuterol that she uses prn - rarely needs when she is at baseline.   She is referred here today for a lot of throat mucous, no nasal congestion. Unclear whether she is having drainage to her throat. Seems to be present at all times. She has begun to experience chest tightness intermittently - can happen at any time. Doesn't always respond to her albuterol. No nocturnal awakening. She has occasional GERD sx on omeprazole depending on her diet. She has sensitivities to trees, ragweed identified.   She has had myalgias, some swallowing difficulty w food getting stuck. Her PCP check auto-immune panel >> positive ANA at low dilution (?clinical significance). She had an EGD 08/26/19, normal esophagus w gastritis.   She is COVID vaccinated.   CT-PA done on 08/21/2019 reviewed by me, showed no evidence of PE, no infiltrates or lung abnormalities, no mediastinal adenopathy.   Review of Systems As per [facility]  Past Medical History:  Diagnosis Date  . Allergic rhinitis   . Allergy    SEASONAL  . Asthma   . Eczema   . Environmental allergies    Age 45 years  . Fatty liver 12/11/2018   Noted on CT 6/20  . Generalized headaches    Began at age 33 years  . GERD (gastroesophageal reflux disease)   . Influenza    Age 46 years  . 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 Streptococcal infection(041.00)    Age 49 and 24 years old  . TB lung, latent    2017  . 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/2018   Diffuse enlargement on [place] 6/20     Family History  Problem Relation Age of Onset  . Migraines Paternal Grandmother   . Migraines Paternal  Grandfather   . HIV Father   . Diabetes Mother   . Hypertension Mother   . Anemia Mother   . COPD Mother   . CVA Mother   . Congestive Heart Failure Mother   . Cirrhosis Mother   . Obesity Mother   . Irritable bowel syndrome Mother   . Ulcers Brother   . Anemia Sister   . Cholecystitis Brother   . Migraines Paternal Aunt   . Migraines Maternal Uncle        Maternal Great Uncle  . Diabetes Other        Family History  . Hypertension Other        Family History  . Depression Other        Family History  . Asthma Other        Family History  . Allergic rhinitis Other        Family History  . Asthma Brother   . Colon cancer Neg Hx   . Colon polyps Neg Hx   . Esophageal cancer Neg Hx   . Rectal cancer Neg Hx   . Stomach cancer Neg Hx      Social History   Socioeconomic History  . Marital status: Married    Spouse name: Not on file  . Number of children: 0  . Years of education: Not on file  .  Highest education level: Associate degree: occupational, Scientist, product/process development, or vocational program  Occupational History  . Occupation: dietary  Tobacco Use  . Smoking status: Never Smoker  . Smokeless tobacco: Never Used  Vaping Use  . Vaping Use: Never used  Substance and Sexual Activity  . Alcohol use: Not Currently  . Drug use: No  . Sexual activity: Yes    Partners: Female    Comment: female partner  Other Topics Concern  . 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    Social Determinants of Health   Financial Resource Strain:   . Difficulty of Paying Living Expenses: Not on file  Food Insecurity:   . Worried About Programme researcher, broadcasting/film/video in the Last Year: Not on file  . Ran Out of Food in the Last Year: Not on file  Transportation Needs:   . Lack of Transportation  (Medical): Not on file  . Lack of Transportation (Non-Medical): Not on file  Physical Activity:   . Days of Exercise per Week: Not on file  . Minutes of Exercise per Session: Not on file  Stress:   . Feeling of Stress : Not on file  Social Connections:   . Frequency of Communication with Friends and Family: Not on file  . Frequency of Social Gatherings with Friends and Family: Not on file  . Attends Religious Services: Not on file  . Active Member of Clubs or Organizations: Not on file  . Attends Banker Meetings: Not on file  . Marital Status: Not on file  Intimate Partner Violence:   . Fear of Current or Ex-Partner: Not on file  . Emotionally Abused: Not on file  . Physically Abused: Not on file  . Sexually Abused: Not on file    She has worked as an Forensic scientist From [place] No chemical exposures 2nd hand smoke exposure.   No Known Allergies   Outpatient Medications Prior to Visit  Medication Sig Dispense Refill  . BLACK ELDERBERRY,BERRY-FLOWER, PO Take by mouth.    . Multiple Vitamin (MULTIVITAMIN) tablet Take 1 tablet by mouth daily.    Marland Kitchen omeprazole (PRILOSEC) 40 MG capsule Take 1 capsule (40 mg total) by mouth 2 (two) times daily. 60 capsule 3  . Hyoscyamine Sulfate SL (LEVSIN/SL) 0.125 MG SUBL Place 1 tablet under the tongue every 8 (eight) hours as needed. 10 tablet 0  . lidocaine (XYLOCAINE) 2 % solution Use as directed 5 mLs in the mouth or throat every 6 (six) hours as needed for mouth pain. 100 mL 0  . meloxicam (MOBIC) 7.5 MG tablet Take 1 tablet (7.5 mg total) by mouth daily. 30 tablet 0  . methylPREDNISolone (MEDROL DOSEPAK) 4 MG TBPK tablet Take Tapered dose as directed 21 tablet 0  . tiZANidine (ZANAFLEX) 4 MG tablet Take 1 tablet (4 mg total) by mouth at bedtime. 30 tablet 0   No facility-administered medications prior to visit.        Objective:   Physical Exam Vitals:   02/10/20 0919  BP: 106/66  Pulse: 90  Temp: 98.1 F (36.7 C)  SpO2: 100%   Weight: 101 lb (45.8 kg)  Height: 4\' 10"  (1.473 m)   Gen: Pleasant, small, thin, in no  distress,  normal affect  ENT: No lesions,  mouth clear,  oropharynx clear, no postnasal drip  Neck: No JVD, no stridor  Lungs: No use of accessory muscles, no crackles or wheezing on normal respiration, no wheeze on forced expiration  Cardiovascular: RRR, heart sounds normal, no murmur or gallops, no peripheral edema  Musculoskeletal: No deformities, no cyanosis or clubbing  Neuro: alert, awake, non focal  Skin: Warm, no lesions or rash     Assessment & Plan:  Asthma Asthma that appears to be mild intermittent.  Often she is not bothered, does not need to use albuterol.  More recently she has had chest discomfort and tightness.  Question whether this is true bronchospasm as it does not respond to albuterol.  She did have mild obstruction on spirometry from 11/2018.  She needs repeat pulmonary function testing to quantify her degree of obstruction.  She has fairly good control of GERD although does describe some dysphagia.  Allergic rhinitis is not controlled at this time, does have significant mucus burden especially in her throat.  Allergic rhinitis Start loratadine, start fluticasone nasal spray to see if we can decrease her mucus burden.  She may need allergy testing at some point going forward.  GERD (gastroesophageal reflux disease) Currently on omeprazole.  She had an EGD that showed a normal esophagus with some gastritis.  Fairly good control, does not seem to relate to her dyspnea or chest tightness.  Positive PPD Treated for latent TB  Levy Pupa, MD, PhD 02/10/2020, 9:56 AM [facility] [phone] or if no answer [phone]

## 2020-02-10 NOTE — Assessment & Plan Note (Signed)
Asthma that appears to be mild intermittent.  Often she is not bothered, does not need to use albuterol.  More recently she has had chest discomfort and tightness.  Question whether this is true bronchospasm as it does not respond to albuterol.  She did have mild obstruction on spirometry from 11/2018.  She needs repeat pulmonary function testing to quantify her degree of obstruction.  She has fairly good control of GERD although does describe some dysphagia.  Allergic rhinitis is not controlled at this time, does have significant mucus burden especially in her throat.

## 2020-02-10 NOTE — Assessment & Plan Note (Signed)
Start loratadine, start fluticasone nasal spray to see if we can decrease her mucus burden.  She may need allergy testing at some point going forward.

## 2020-02-10 NOTE — Assessment & Plan Note (Signed)
Currently on omeprazole.  She had an EGD that showed a normal esophagus with some gastritis.  Fairly good control, does not seem to relate to her dyspnea or chest tightness.

## 2020-02-11 ENCOUNTER — Telehealth: Payer: Self-pay | Admitting: Family Medicine

## 2020-02-11 NOTE — Telephone Encounter (Signed)
Reviewed pt lab results and recommendations, verbalized understanding 

## 2020-02-11 NOTE — Telephone Encounter (Signed)
Pt is calling about her lab results. She has been calling for a week and wants someone to talk to her about her positive result.  Please advise

## 2020-02-16 ENCOUNTER — Other Ambulatory Visit: Payer: Self-pay

## 2020-02-16 ENCOUNTER — Ambulatory Visit (INDEPENDENT_AMBULATORY_CARE_PROVIDER_SITE_OTHER): Payer: No Typology Code available for payment source | Admitting: Otolaryngology

## 2020-02-16 VITALS — Temp 97.2°F

## 2020-02-16 DIAGNOSIS — J029 Acute pharyngitis, unspecified: Secondary | ICD-10-CM | POA: Diagnosis not present

## 2020-02-16 DIAGNOSIS — R1310 Dysphagia, unspecified: Secondary | ICD-10-CM | POA: Diagnosis not present

## 2020-02-16 NOTE — Progress Notes (Signed)
HPI: Maria Smith is a 24 y.o. female who returns today for evaluation of chronic right sided throat pain.  She points to the right side of her neck just below the angle of the jaw and describes pain in this region when she swallows or eats.  She does not have that much trouble with liquids.  She is scheduled to see gastroenterology in 2 to 3 weeks.  She is having no hoarseness however she has been losing weight..  Past Medical History:  Diagnosis Date  . Allergic rhinitis   . Allergy    SEASONAL  . Asthma   . Eczema   . Environmental allergies    Age 37 years  . Fatty liver 12/11/2018   Noted on CT 6/20  . Generalized headaches    Began at age 59 years  . GERD (gastroesophageal reflux disease)   . Influenza    Age 48 years  . 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 Streptococcal infection(041.00)    Age 89 and 24 years old  . TB lung, latent    2017  . 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/2018   Diffuse enlargement on [place] 6/20   Past Surgical History:  Procedure Laterality Date  . TONSILLECTOMY  2012  . WISDOM TOOTH EXTRACTION     Social History   Socioeconomic History  . Marital status: Married    Spouse name: Not on file  . Number of children: 0  . Years of education: Not on file  . Highest education level: Associate degree: occupational, Scientist, product/process development, or vocational program  Occupational History  . Occupation: dietary  Tobacco Use  . Smoking status: Never Smoker  . Smokeless tobacco: Never Used  Vaping Use  . Vaping Use: Never used  Substance and Sexual Activity  . Alcohol use: Not Currently  . Drug use: No  . Sexual activity: Yes    Partners: Female    Comment: female partner  Other Topics Concern  . 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     Social Determinants of Health   Financial Resource Strain:   . Difficulty of Paying Living Expenses: Not on file  Food Insecurity:   . Worried About Programme researcher, broadcasting/film/video in the Last Year: Not on file  . Ran Out of Food in the Last Year: Not on file  Transportation Needs:   . Lack of Transportation (Medical): Not on file  . Lack of Transportation (Non-Medical): Not on file  Physical Activity:   . Days of Exercise per Week: Not on file  . Minutes of Exercise per Session: Not on file  Stress:   . Feeling of Stress : Not on file  Social Connections:   . Frequency of Communication with Friends and Family: Not on file  . Frequency of Social Gatherings with Friends and Family: Not on file  . Attends Religious Services: Not on file  . Active Member of Clubs or Organizations: Not on file  . Attends Banker Meetings: Not on file  . Marital Status: Not on file   Family History  Problem Relation Age of Onset  . Migraines Paternal Grandmother   . Migraines Paternal Grandfather   . HIV Father   .  Diabetes Mother   . Hypertension Mother   . Anemia Mother   . COPD Mother   . CVA Mother   . Congestive Heart Failure Mother   . Cirrhosis Mother   . Obesity Mother   . Irritable bowel syndrome Mother   . Ulcers Brother   . Anemia Sister   . Cholecystitis Brother   . Migraines Paternal Aunt   . Migraines Maternal Uncle        Maternal Great Uncle  . Diabetes Other        Family History  . Hypertension Other        Family History  . Depression Other        Family History  . Asthma Other        Family History  . Allergic rhinitis Other        Family History  . Asthma Brother   . Colon cancer Neg Hx   . Colon polyps Neg Hx   . Esophageal cancer Neg Hx   . Rectal cancer Neg Hx   . Stomach cancer Neg Hx    No Known Allergies Prior to Admission medications   Medication Sig Start Date End Date Taking? Authorizing Provider  BLACK ELDERBERRY,BERRY-FLOWER, PO Take by  mouth.   Yes [provider]  fluticasone (FLONASE) 50 MCG/ACT nasal spray Place 2 sprays into both nostrils daily. 02/10/20  Yes Leslye Peer, MD  loratadine (CLARITIN) 10 MG tablet Take 1 tablet (10 mg total) by mouth daily. 02/10/20  Yes Leslye Peer, MD  Multiple Vitamin (MULTIVITAMIN) tablet Take 1 tablet by mouth daily.   Yes [provider]  omeprazole (PRILOSEC) 40 MG capsule Take 1 capsule (40 mg total) by mouth 2 (two) times daily. 07/03/19  Yes Deeann Saint, MD  Hyoscyamine Sulfate SL (LEVSIN/SL) 0.125 MG SUBL Place 1 tablet under the tongue every 8 (eight) hours as needed. 10/23/19   Elvina Sidle, MD  lidocaine (XYLOCAINE) 2 % solution Use as directed 5 mLs in the mouth or throat every 6 (six) hours as needed for mouth pain. 11/10/19   Joni Reining, PA-C  meloxicam (MOBIC) 7.5 MG tablet Take 1 tablet (7.5 mg total) by mouth daily. 12/13/19   Wallis Bamberg, PA-C  methylPREDNISolone (MEDROL DOSEPAK) 4 MG TBPK tablet Take Tapered dose as directed 11/10/19   Joni Reining, PA-C  tiZANidine (ZANAFLEX) 4 MG tablet Take 1 tablet (4 mg total) by mouth at bedtime. 12/13/19   Wallis Bamberg, PA-C  albuterol (VENTOLIN HFA) 108 (90 Base) MCG/ACT inhaler Inhale 2 puffs into the lungs every 6 (six) hours as needed for wheezing or shortness of breath. Patient not taking: Reported on 10/07/2019 06/03/19 10/23/19  Deeann Saint, MD  levocetirizine (XYZAL) 5 MG tablet TAKE 1 TABLET BY MOUTH EVERY DAY IN THE EVENING Patient not taking: No sig reported 04/15/19 10/23/19  Deeann Saint, MD     Positive ROS: Otherwise negative  All other systems have been reviewed and were otherwise negative with the exception of those mentioned in the HPI and as above.  Physical Exam: Constitutional: Alert, well-appearing, no acute distress Ears: External ears without lesions or tenderness. Ear canals are clear bilaterally with intact, clear TMs.  Nasal: External nose without lesions. Septum  midline with mild rhinitis.  Both middle meatus regions are clear with no signs of infection.. Clear nasal passages Oral: Lips and gums without lesions. Tongue and palate mucosa without lesions. Posterior oropharynx clear.  Patient is status  post tonsillectomy.  Indirect laryngoscopy revealed a clear base of tongue vallecula and epiglottis.  No hypopharyngeal abnormalities noted on indirect laryngoscopy. Fiberoptic laryngoscopy was performed to the right nostril.  The nasopharynx was clear.  The base of tongue vallecula and epiglottis were normal.  The hypopharynx was clear.  Both piriform sinuses were clear.  Vocal cords were clear bilaterally with normal vocal mobility.  The fiberoptic laryngoscope was passed through the upper esophageal sphincter without difficulty with clear upper cervical esophagus. Neck: No palpable adenopathy or masses.  No palpable adenopathy on the right side of the neck. Respiratory: Breathing comfortably  Skin: No facial/neck lesions or rash noted.  Laryngoscopy  Date/Time: 02/16/2020 6:11 PM Performed by: Drema Halon, MD Authorized by: Drema Halon, MD   Consent:    Consent obtained:  Verbal   Consent given by:  Patient Procedure details:    Indications: direct visualization of the upper aerodigestive tract     Medication:  Afrin   Instrument: flexible fiberoptic laryngoscope     Scope location: right nare   Sinus:    Right nasopharynx: normal   Mouth:    Oropharynx: normal     Vallecula: normal     Base of tongue: normal     Epiglottis: normal   Throat:    Right hypopharynx: normal     Left hypopharynx: normal     Pyriform sinus: normal     True vocal cords: normal   Comments:     On fiberoptic laryngoscopy the hypopharynx and larynx were clear to evaluation with no evidence of infection or neoplasm.  Cannot identify etiology of patient's chronic discomfort.    Assessment: Chronic right sided throat pain questionable etiology. No  obvious evidence of infection on fiberoptic laryngoscopy or indirect laryngoscopy.  Plan: Recommended proceeding with evaluation with gastroenterology.   Narda Bonds, MD

## 2020-02-22 ENCOUNTER — Emergency Department: Payer: No Typology Code available for payment source

## 2020-02-22 ENCOUNTER — Encounter: Payer: Self-pay | Admitting: Emergency Medicine

## 2020-02-22 ENCOUNTER — Other Ambulatory Visit: Payer: Self-pay

## 2020-02-22 ENCOUNTER — Emergency Department
Admission: EM | Admit: 2020-02-22 | Discharge: 2020-02-22 | Disposition: A | Discharge status: Home / Self Care | Payer: No Typology Code available for payment source | Attending: Emergency Medicine | Admitting: Emergency Medicine

## 2020-02-22 DIAGNOSIS — J45909 Unspecified asthma, uncomplicated: Secondary | ICD-10-CM | POA: Insufficient documentation

## 2020-02-22 DIAGNOSIS — M79662 Pain in left lower leg: Secondary | ICD-10-CM | POA: Insufficient documentation

## 2020-02-22 DIAGNOSIS — K219 Gastro-esophageal reflux disease without esophagitis: Secondary | ICD-10-CM | POA: Insufficient documentation

## 2020-02-22 DIAGNOSIS — R079 Chest pain, unspecified: Secondary | ICD-10-CM

## 2020-02-22 DIAGNOSIS — R0789 Other chest pain: Secondary | ICD-10-CM | POA: Insufficient documentation

## 2020-02-22 DIAGNOSIS — M7918 Myalgia, other site: Secondary | ICD-10-CM

## 2020-02-22 LAB — CBC
HCT: 38.3 % (ref 36.0–46.0)
Hemoglobin: 13.3 g/dL (ref 12.0–15.0)
MCH: 29.2 pg (ref 26.0–34.0)
MCHC: 34.7 g/dL (ref 30.0–36.0)
MCV: 84 fL (ref 80.0–100.0)
Platelets: 283 10*3/uL (ref 150–400)
RBC: 4.56 MIL/uL (ref 3.87–5.11)
RDW: 12.8 % (ref 11.5–15.5)
WBC: 3.2 10*3/uL — ABNORMAL LOW (ref 4.0–10.5)
nRBC: 0 % (ref 0.0–0.2)

## 2020-02-22 LAB — HEPATIC FUNCTION PANEL
ALT: 20 U/L (ref 0–44)
AST: 27 U/L (ref 15–41)
Albumin: 4 g/dL (ref 3.5–5.0)
Alkaline Phosphatase: 41 U/L (ref 38–126)
Bilirubin, Direct: <0.1 mg/dL (ref 0.0–0.2)
Total Bilirubin: 0.5 mg/dL (ref 0.3–1.2)
Total Protein: 7.5 g/dL (ref 6.5–8.1)

## 2020-02-22 LAB — BASIC METABOLIC PANEL
Anion gap: 9 (ref 5–15)
BUN: 7 mg/dL (ref 6–20)
CO2: 26 mmol/L (ref 22–32)
Calcium: 9.4 mg/dL (ref 8.9–10.3)
Chloride: 102 mmol/L (ref 98–111)
Creatinine, Ser: 0.79 mg/dL (ref 0.44–1.00)
GFR calc Af Amer: >60 mL/min (ref >60)
GFR calc non Af Amer: >60 mL/min (ref >60)
Glucose, Bld: 96 mg/dL (ref 70–99)
Potassium: 4.1 mmol/L (ref 3.5–5.1)
Sodium: 137 mmol/L (ref 135–145)

## 2020-02-22 LAB — LIPASE, BLOOD: Lipase: 48 U/L (ref 11–51)

## 2020-02-22 LAB — TROPONIN I (HIGH SENSITIVITY): Troponin I (High Sensitivity): 3 ng/L (ref <18)

## 2020-02-22 NOTE — ED Notes (Signed)
Patient verbalizes understanding of discharge instructions. Opportunity for questioning and answers were provided. Armband removed by staff, pt discharged from ED. Ambulated out to lobby  

## 2020-02-22 NOTE — ED Triage Notes (Signed)
C/O left calf and foot pain x  1.5 Days.  Also c/o chest pain x 1.5 Days.  Also c/o LUQ abdominal pain.  AAOx3. Skin warm and dry. NAD

## 2020-02-22 NOTE — ED Provider Notes (Signed)
[facility] Emergency Department Provider Note  Time seen: 9:09 AM  I have reviewed the triage vital signs and the nursing notes.   HISTORY  Chief Complaint Chest Pain   HPI Maria Smith is a 24 y.o. female with a past medical history of asthma, gastric reflux, presents to the emergency department for intermittent chest pain as well as left calf pain.  According to the patient over the past 1 to 2 days she has been experiencing intermittent pain in her chest.  Patient states this past Friday she had her Covid vaccination, Saturday and developed low-grade fever but that went away Saturday evening.  For the past 2 days or so she has been experiencing some pain in her left calf, states her brother was recently diagnosed with a blood clot and he spoke to the patient saying she should go get evaluated.  Patient denies any chest pain currently.  States when it does occur it is mild, states she has had this for a long time and has been told is musculoskeletal pain in the past.  Patient states the left calf pain is a mild dull pain that has been more constant.  Past Medical History:  Diagnosis Date  . Allergic rhinitis   . Allergy    SEASONAL  . Asthma   . Eczema   . Environmental allergies    Age 33 years  . Fatty liver 12/11/2018   Noted on CT 6/20  . Generalized headaches    Began at age 41 years  . GERD (gastroesophageal reflux disease)   . Influenza    Age 26 years  . 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 Streptococcal infection(041.00)    Age 46 and 24 years old  . TB lung, latent    2017  . 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/2018   Diffuse enlargement on [place] 6/20    Patient Active Problem List   Diagnosis Date Noted  . Asthma 02/10/2020  . Allergic rhinitis 02/10/2020  . GERD (gastroesophageal reflux disease) 02/10/2020  . History of TB (tuberculosis) 08/21/2019  . Sore throat 01/28/2019  . Odynophagia 01/28/2019  . Neck pain 01/28/2019   . Thyroiditis 01/12/2019  . 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/2018  . Fatty liver 12/11/2018  . Overweight (BMI 25.0-29.9) 11/12/2018  . PCOS (polycystic ovarian syndrome) 11/11/2018  . Seasonal allergies 09/22/2018  . 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 Migraine with aura and without status migrainosus, not intractable 09/11/2012  . Variants of migraine, not elsewhere classified, without mention of intractable migraine without mention of status migrainosus 09/11/2012  . Unspecified constipation 09/11/2012  . Syncope 08/31/2010  . Neck Pain 08/31/2010  . Insomnia 08/31/2010    Past Surgical History:  Procedure Laterality Date  . TONSILLECTOMY  2012  . WISDOM TOOTH EXTRACTION      Prior to Admission medications   Medication Sig Start Date End Date Taking? Authorizing Provider  BLACK ELDERBERRY,BERRY-FLOWER, PO Take by mouth.    [provider]  fluticasone (FLONASE) 50 MCG/ACT nasal spray Place 2 sprays into both nostrils daily. 02/10/20   Leslye Peer, MD  Hyoscyamine Sulfate SL (LEVSIN/SL) 0.125 MG SUBL Place 1 tablet under the tongue every 8 (eight) hours as needed. 10/23/19   Elvina Sidle, MD  lidocaine (XYLOCAINE) 2 % solution Use as directed 5 mLs in the mouth or throat every 6 (six) hours as needed for mouth pain. 11/10/19   Joni Reining, PA-Smith  loratadine (CLARITIN) 10 MG tablet Take 1 tablet (  10 mg total) by mouth daily. 02/10/20   Leslye Peer, MD  meloxicam (MOBIC) 7.5 MG tablet Take 1 tablet (7.5 mg total) by mouth daily. 12/13/19   Wallis Bamberg, PA-Smith  methylPREDNISolone (MEDROL DOSEPAK) 4 MG TBPK tablet Take Tapered dose as directed 11/10/19   Joni Reining, PA-Smith  Multiple Vitamin (MULTIVITAMIN) tablet Take 1 tablet by mouth daily.    [provider]  omeprazole (PRILOSEC) 40 MG capsule Take 1 capsule (40 mg total) by mouth 2 (two) times daily. 07/03/19   Deeann Saint, MD  tiZANidine (ZANAFLEX) 4 MG tablet Take 1 tablet (4 mg total) by mouth at bedtime. 12/13/19   Wallis Bamberg, PA-Smith  albuterol (VENTOLIN HFA) 108 (90 Base) MCG/ACT inhaler Inhale 2 puffs into the lungs every 6 (six) hours as needed for wheezing or shortness of breath. Patient not taking: Reported on 10/07/2019 06/03/19 10/23/19  Deeann Saint, MD  levocetirizine (XYZAL) 5 MG tablet TAKE 1 TABLET BY MOUTH EVERY DAY IN THE EVENING Patient not taking: No sig reported 04/15/19 10/23/19  Deeann Saint, MD    No Known Allergies  Family History  Problem Relation Age of Onset  . Migraines Paternal Grandmother   . Migraines Paternal Grandfather   . HIV Father   . Diabetes Mother   . Hypertension Mother   . Anemia Mother   . COPD Mother   . CVA Mother   . Congestive Heart Failure Mother   . Cirrhosis Mother   . Obesity Mother   . Irritable bowel syndrome Mother   . Ulcers Brother   . Anemia Sister   . Cholecystitis Brother   . Migraines Paternal Aunt   . Migraines Maternal Uncle        Maternal Great Uncle  . Diabetes Other        Family History  . Hypertension Other        Family History  . Depression Other        Family History  . Asthma Other        Family History  . Allergic rhinitis Other        Family History  . Asthma Brother   . Colon cancer Neg Hx   . Colon polyps Neg Hx   . Esophageal cancer Neg Hx   . Rectal cancer Neg Hx   . Stomach cancer Neg Hx     Social History Social History   Tobacco Use  . Smoking status: Never Smoker  . Smokeless tobacco: Never Used  Vaping Use  . Vaping Use: Never used  Substance Use Topics  . Alcohol use: Not Currently  . Drug use: No    Review of Systems Constitutional: Low-grade fever Saturday, gone since. Cardiovascular: Intermittent chest pains.  Mild. Respiratory: Negative for shortness of breath. Gastrointestinal: Mild upper abdominal pain, vomiting and diarrhea. Musculoskeletal: Mild dull left calf pain Skin: Negative for skin complaints  Neurological: Negative for headache All other ROS  negative  ____________________________________________   PHYSICAL EXAM:  VITAL SIGNS: ED Triage Vitals  Enc Vitals Group     BP 02/22/20 0757 122/87     Pulse Rate 02/22/20 0757 81     Resp 02/22/20 0757 16     Temp 02/22/20 0757 98.7 F (37.1 Smith)     Temp Source 02/22/20 0757 Oral     SpO2 02/22/20 0757 98 %     Weight 02/22/20 0754 100 lb 15.5 oz (45.8 kg)     Height 02/22/20  0754 4\' 10"  (1.473 m)     Head Circumference --      Peak Flow --      Pain Score 02/22/20 0754 8     Pain Loc --      Pain Edu? --      Excl. in GC? --    Constitutional: Alert and oriented. Well appearing and in no distress. Eyes: Normal exam ENT      Head: Normocephalic and atraumatic.      Mouth/Throat: Mucous membranes are moist. Cardiovascular: Normal rate, regular rhythm. No murmur Respiratory: Normal respiratory effort without tachypnea nor retractions. Breath sounds are clear Gastrointestinal: Soft and nontender. No distention.  Musculoskeletal: Nontender with normal range of motion in all extremities. No lower extremity tenderness or edema. Neurologic:  Normal speech and language. No gross focal neurologic deficits are appreciated. Skin:  Skin is warm, dry and intact.  Psychiatric: Mood and affect are normal. Speech and behavior are normal.   ____________________________________________    EKG  EKG viewed and interpreted by myself shows a normal sinus rhythm at 80 bpm with a narrow QRS, normal axis, normal intervals, no concerning ST changes.  ____________________________________________    RADIOLOGY  Chest x-ray is negative  ____________________________________________   INITIAL IMPRESSION / ASSESSMENT AND PLAN / ED COURSE  Pertinent labs & imaging results that were available during my care of the patient were reviewed by me and considered in my medical decision making (see chart for details).   Patient presents emergency department for intermittent chest pain although  states this is mild and somewhat chronic per patient.  She does state 2 days of left calf pain.  Overall patient appears well, reassuring labs including negative troponin, reassuring EKG and a negative chest x-ray.  We will obtain a left lower extremity ultrasound to rule out DVT I have also added on LFTs and lipase as a precaution.  Patient has a benign abdominal exam.  Patient's work-up has resulted normal.  Ultrasound is negative for DVT.  LFTs and lipase are normal.  Overall reassuring work-up we will have the patient follow-up with her PCP.  Maria Smith [birth year] was evaluated in Emergency Department on 02/22/2020 for the symptoms described in the history of present illness. She was evaluated in the context of the global COVID-19 pandemic, which necessitated consideration that the patient might be at risk for infection with the SARS-CoV-2 virus that causes COVID-19. Institutional protocols and algorithms that pertain to the evaluation of patients at risk for COVID-19 are in a state of rapid change based on information released by regulatory bodies including the CDC and federal and state organizations. These policies and algorithms were followed during the patient's care in the ED.  ____________________________________________   FINAL CLINICAL IMPRESSION(S) / ED DIAGNOSES  Chest pain Calf pain    04/23/2020, MD 02/22/20 1053

## 2020-03-01 ENCOUNTER — Ambulatory Visit: Payer: No Typology Code available for payment source | Admitting: Nurse Practitioner

## 2020-03-23 ENCOUNTER — Ambulatory Visit (INDEPENDENT_AMBULATORY_CARE_PROVIDER_SITE_OTHER): Payer: No Typology Code available for payment source | Admitting: Internal Medicine

## 2020-03-23 ENCOUNTER — Other Ambulatory Visit: Payer: Self-pay

## 2020-03-23 ENCOUNTER — Encounter: Payer: Self-pay | Admitting: Internal Medicine

## 2020-03-23 VITALS — BP 105/73 | HR 75 | Ht <= 58 in | Wt 104.0 lb

## 2020-03-23 DIAGNOSIS — R768 Other specified abnormal immunological findings in serum: Secondary | ICD-10-CM | POA: Insufficient documentation

## 2020-03-23 DIAGNOSIS — R0789 Other chest pain: Secondary | ICD-10-CM | POA: Insufficient documentation

## 2020-03-23 DIAGNOSIS — J4520 Mild intermittent asthma, uncomplicated: Secondary | ICD-10-CM | POA: Diagnosis not present

## 2020-03-23 NOTE — Progress Notes (Signed)
Office Visit Note  Patient: Maria Smith             Date of Birth: March 25, 1996           MRN: [id number]             PCP: Billie Ruddy, MD Referring: Billie Ruddy, MD Visit Date: 03/23/2020  Subjective:  Positive ANA (patient complains of muscle pain, rib pain, and back pain)   History of Present Illness: Bannie Lobban is a 24 y.o. female referred for positive ANA with chronic left sided chest wall pain. She has been experiencing chest pain since about a year ago that she has on most days. She feels this over her sternum, anterior chest, lateral chest, and above the scapula all limited to the left side. This is exacerbated with movement such as reaching overhead and across the body and when lying down. She does feel increased pain with taking deep breaths. She describes this as a "dull pinching" type of pain. She was recently referred to pulmonology for her mild persistent asthma symptoms but denies any chronic coughing or daily respiratory complaints. 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  She has been evaluated on several occasions for this including EKG, CT angiography, and chest x-rays which have all been unremarkable for an underlying cause. She previously had a negative ANA screen but repeat evaluation in the past 80-month showed weakly positive ANA but normal complements, inflammatory markers, and negative ENA panel.  Labs reviewed include the following 01/2020 ANA 1:80 nuclear, speckled C3,C4 normal Ds DNA negative [place] Ab negative SSA negative SSB negative TPO Ab negative Scl-70 negative RF negative TSH 1.23 CBC normal CMP normal ESR 2 CRP 0.5   Activities of Daily Living:  Patient reports morning stiffness for 0 minutes.   Patient Reports nocturnal pain.  Difficulty dressing/grooming: Denies Difficulty climbing stairs: Denies Difficulty getting out of  chair: Denies Difficulty using hands for taps, buttons, cutlery, and/or writing: Denies  Review of Systems  Constitutional: Positive for fatigue.  HENT: Negative for mouth sores, mouth dryness and nose dryness.   Eyes: Negative for pain, itching, visual disturbance and dryness.  Respiratory: Negative for cough, hemoptysis, shortness of breath and difficulty breathing.   Cardiovascular: Positive for chest pain and palpitations. Negative for swelling in legs/feet.  Gastrointestinal: Negative for abdominal pain, blood in stool, constipation and diarrhea.  Endocrine: Negative for increased urination.  Genitourinary: Negative for painful urination.  Musculoskeletal: Positive for myalgias, muscle weakness, muscle tenderness and myalgias. Negative for arthralgias, joint pain, joint swelling and morning stiffness.  Skin: Negative for color change, rash, hair loss, nodules/bumps and redness.  Allergic/Immunologic: Negative for susceptible to infections.  Neurological: Negative for dizziness, headaches, memory loss and weakness.  Hematological: Negative for swollen glands.  Psychiatric/Behavioral: Negative for depressed mood, confusion and sleep disturbance. The patient is not nervous/anxious.     PMFS History:  Patient Active Problem List   Diagnosis Date Noted  . Left-sided chest wall pain 03/23/2020  . Positive ANA (antinuclear antibody) 03/23/2020  . Asthma 02/10/2020  . Allergic rhinitis 02/10/2020  . GERD (gastroesophageal reflux disease) 02/10/2020  . History of TB (tuberculosis) 08/21/2019  . Throat pain 01/28/2019  . Odynophagia 01/28/2019  . Neck pain 01/28/2019  . Thyroiditis 01/12/2019  . 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/2018  . Fatty liver 12/11/2018  . PCOS (polycystic ovarian syndrome) 11/11/2018  . Seasonal allergies 09/22/2018  .  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 Migraine with aura and without status migrainosus, not intractable 09/11/2012  . Variants of migraine, not elsewhere classified,  without mention of intractable migraine without mention of status migrainosus 09/11/2012  . Unspecified constipation 09/11/2012  . Syncope 08/31/2010  . Neck Pain 08/31/2010  . Insomnia 08/31/2010    Past Medical History:  Diagnosis Date  . Allergic rhinitis   . Allergy    SEASONAL  . Asthma   . Eczema   . Environmental allergies    Age 57 years  . Fatty liver 12/11/2018   Noted on CT 6/20  . Generalized headaches    Began at age 1 years  . GERD (gastroesophageal reflux disease)   . Influenza    Age 2 years  . 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 Streptococcal infection(041.00)    Age 18 and 24 years old  . TB lung, latent    2017  . 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/2018   Diffuse enlargement on [place] 6/20    Family History  Problem Relation Age of Onset  . Migraines Paternal Grandmother   . Migraines Paternal Grandfather   . HIV Father   . Diabetes Mother   . Hypertension Mother   . Anemia Mother   . COPD Mother   . CVA Mother   . Congestive Heart Failure Mother   . Cirrhosis Mother   . Obesity Mother   . Irritable bowel syndrome Mother   . Rheum arthritis Mother   . Ulcers Brother   . Anemia Sister   . Cholecystitis Brother   . Migraines Paternal Aunt   . Migraines Maternal Uncle        Maternal Great Uncle  . Diabetes Other        Family History  . Hypertension Other        Family History  . Depression Other        Family History  . Asthma Other        Family History  . Allergic rhinitis Other        Family History  . Asthma Brother   . Colon cancer Neg Hx   . Colon polyps Neg Hx   . Esophageal cancer Neg Hx   . Rectal cancer Neg Hx   . Stomach cancer Neg Hx    Past Surgical History:  Procedure Laterality Date  . TONSILLECTOMY  2012  . 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    Immunization History  Administered Date(s) Administered  . Influenza,inj,Quad PF,6+ Mos 05/04/2016  . PFIZER SARS-COV-2 Vaccination 01/29/2020, 02/19/2020  . Tetanus 06/18/2013     Objective: Vital Signs: BP 105/73 (BP Location: Right Arm, Patient Position: Sitting, Cuff Size: Small)   Pulse 75   Ht 4' 8.75" (1.441 m)   Wt 104 lb (47.2 kg)   LMP 02/29/2020 (Within Days)   BMI 22.70 kg/m    Physical Exam HENT:     Head: Normocephalic.     Right Ear: External ear normal.     Left Ear: External ear normal.     Mouth/Throat:     Mouth: Mucous membranes are moist.  Pharynx: Oropharynx is clear.  Eyes:     Conjunctiva/sclera: Conjunctivae normal.     Pupils: Pupils are equal, round, and reactive to light.  Cardiovascular:     Rate and Rhythm: Normal rate and regular rhythm.     Pulses: Normal pulses.  Pulmonary:     Effort: Pulmonary effort is normal.     Breath sounds: Normal breath sounds.  Skin:    General: Skin is warm and dry.  Neurological:     Mental Status: She is alert.      Musculoskeletal Exam:  Neck full range of motion no tenderness Left shoulder range of motion provokes lateral chest wall pain with abduction and pain above the scapula with cross arm abduction, no tenderness or swelling of joint Normal left shoulder, normal elbows, wrists, fingers full ROM no tenderness or swelling Hips, knees, ankles, and MTP full ROM without tenderness or swelling  CDAI Exam: CDAI Score: -- Patient Global: --; Provider Global: -- Swollen: --; Tender: -- Joint Exam 03/23/2020   No joint exam has been documented for this visit   There is currently no information documented on the homunculus. Go to the Rheumatology activity and complete the homunculus joint exam.  Investigation: No additional findings.  Imaging: No results found.  Recent Labs: Lab Results  Component Value Date    WBC 3.2 (L) 02/22/2020   HGB 13.3 02/22/2020   PLT 283 02/22/2020   NA 137 02/22/2020   K 4.1 02/22/2020   CL 102 02/22/2020   CO2 26 02/22/2020   GLUCOSE 96 02/22/2020   BUN 7 02/22/2020   CREATININE 0.79 02/22/2020   BILITOT 0.5 02/22/2020   ALKPHOS 41 02/22/2020   AST 27 02/22/2020   ALT 20 02/22/2020   PROT 7.5 02/22/2020   ALBUMIN 4.0 02/22/2020   CALCIUM 9.4 02/22/2020   GFRAA >60 02/22/2020    Speciality Comments: No specialty comments available.  Procedures:  No procedures performed Allergies: Patient has no known allergies.   Assessment / Plan:     Visit Diagnoses: Positive ANA (antinuclear antibody)  Left-sided chest wall pain - Plan: Ambulatory referral to Physical Therapy  Her chest wall pain seems very consistent with a muscular cause at his is provoked with resisted arm movements and localizes to the superficial locations.  She denies pleuritic sounding chest pain.  She has had extensive work-up with electrical and radiographic studies none of which suggested arterial disease.  Low positive ANA but extensive ENA panel was all negative and normal inflammatory markers so does not meet clinical criteria of systemic lupus.  Discussed range of motion exercises today and will refer to physical therapy for evaluation and treatment.  No specific rheumatologic follow-up recommended at this time.   Mild intermittent asthma without complication She has a long history of asthma but symptoms are very mild and denies any daily coughing or dyspnea likely to explain a persistent chest wall pain.  Latent TB treatment picked up on screening for employment and was never symptomatic or with radiographic changes from her multiple chest x-rays and CT scans.  Orders: Orders Placed This Encounter  Procedures  . Ambulatory referral to Physical Therapy   No orders of the defined types were placed in this encounter.    Follow-Up Instructions: No follow-ups on file.   Fuller Plan, MD  Note - This record has been created using [employer].  Chart creation errors have been sought, but may not always  have been located. Such creation errors do  not reflect on  the standard of medical care.

## 2020-03-23 NOTE — Patient Instructions (Signed)
Shoulder Range of Motion Exercises Shoulder range of motion (ROM) exercises are done to keep the shoulder moving freely or to increase movement. They are often recommended for people who have shoulder pain or stiffness or who are recovering from a shoulder surgery. Phase 1 exercises When you are able, do this exercise 1-2 times per day for 30-60 seconds in each direction, or as directed by your health care provider. Pendulum exercise To do this exercise while sitting: 1. Sit in a chair or at the edge of your bed with your feet flat on the floor. 2. Let your affected arm hang down in front of you over the edge of the bed or chair. 3. Relax your shoulder, arm, and hand. 4. Rock your body so your arm gently swings in small circles. You can also use your unaffected arm to start the motion. 5. Repeat changing the direction of the circles, swinging your arm left and right, and swinging your arm forward and back. To do this exercise while standing: 1. Stand next to a sturdy chair or table, and hold on to it with your hand on your unaffected side. 2. Bend forward at the waist. 3. Bend your knees slightly. 4. Relax your shoulder, arm, and hand. 5. While keeping your shoulder relaxed, use body motion to swing your arm in small circles. 6. Repeat changing the direction of the circles, swinging your arm left and right, and swinging your arm forward and back. 7. Between exercises, stand up tall and take a short break to relax your lower back.  Phase 2 exercises Do these exercises 1-2 times per day or as told by your health care provider. Hold each stretch for 30 seconds, and repeat 3 times. Do the exercises with one or both arms as instructed by your health care provider. For these exercises, sit at a table with your hand and arm supported by the table. A chair that slides easily or has wheels can be helpful. External rotation 1. Turn your chair so that your affected side is nearest to the  table. 2. Place your forearm on the table to your side. Bend your elbow about 90 at the elbow (right angle) and place your hand palm facing down on the table. Your elbow should be about 6 inches away from your side. 3. Keeping your arm on the table, lean your body forward. Abduction 1. Turn your chair so that your affected side is nearest to the table. 2. Place your forearm and hand on the table so that your thumb points toward the ceiling and your arm is straight out to your side. 3. Slide your hand out to the side and away from you, using your unaffected arm to do the work. 4. To increase the stretch, you can slide your chair away from the table. Flexion: forward stretch 1. Sit facing the table. Place your hand and elbow on the table in front of you. 2. Slide your hand forward and away from you, using your unaffected arm to do the work. 3. To increase the stretch, you can slide your chair backward. Phase 3 exercises Do these exercises 1-2 times per day or as told by your health care provider. Hold each stretch for 30 seconds, and repeat 3 times. Do the exercises with one or both arms as instructed by your health care provider. Cross-body stretch: posterior capsule stretch 1. Lift your arm straight out in front of you. 2. Bend your arm 90 at the elbow (right angle) so your forearm   moves across your body. 3. Use your other arm to gently pull the elbow across your body, toward your other shoulder. Wall climbs 1. Stand with your affected arm extended out to the side with your hand resting on a door frame. 2. Slide your hand slowly up the door frame. 3. To increase the stretch, step through the door frame. Keep your body upright and do not lean. Wand exercises You will need a cane, a piece of PVC pipe, or a sturdy wooden dowel for wand exercises. Flexion To do this exercise while standing: 1. Hold the wand with both of your hands, palms down. 2. Using the other arm to help, lift your arms  up and over your head, if able. 3. Push upward with your other arm to gently increase the stretch. To do this exercise while lying down: 1. Lie on your back with your elbows resting on the floor and the wand in both your hands. Your hands will be palm down, or pointing toward your feet. 2. Lift your hands toward the ceiling, using your unaffected arm to help if needed. 3. Bring your arms overhead as able, using your unaffected arm to help if needed. Internal rotation 1. Stand while holding the wand behind you with both hands. Your unaffected arm should be extended above your head with the arm of the affected side extended behind you at the level of your waist. The wand should be pointing straight up and down as you hold it. 2. Slowly pull the wand up behind your back by straightening the elbow of your unaffected arm and bending the elbow of your affected arm. External rotation 1. Lie on your back with your affected upper arm supported on a small pillow or rolled towel. When you first do this exercise, keep your upper arm close to your body. Over time, bring your arm up to a 90 angle out to the side. 2. Hold the wand across your stomach and with both hands palm up. Your elbow on your affected side should be bent at a 90 angle. 3. Use your unaffected side to help push your forearm away from you and toward the floor. Keep your elbow on your affected side bent at a 90 angle. Contact a health care provider if you have:  New or increasing pain.  New numbness, tingling, weakness, or discoloration in your arm or hand. This information is not intended to replace advice given to you by your health care provider. Make sure you discuss any questions you have with your health care provider. Document Revised: 07/17/2017 Document Reviewed: 07/17/2017 Elsevier Patient Education  2020 [employer]  

## 2020-03-30 ENCOUNTER — Ambulatory Visit: Payer: No Typology Code available for payment source | Admitting: Gastroenterology

## 2020-04-13 ENCOUNTER — Ambulatory Visit (INDEPENDENT_AMBULATORY_CARE_PROVIDER_SITE_OTHER): Payer: No Typology Code available for payment source | Admitting: Physical Therapy

## 2020-04-13 ENCOUNTER — Encounter: Payer: Self-pay | Admitting: Physical Therapy

## 2020-04-13 ENCOUNTER — Other Ambulatory Visit: Payer: Self-pay

## 2020-04-13 DIAGNOSIS — M6281 Muscle weakness (generalized): Secondary | ICD-10-CM

## 2020-04-13 DIAGNOSIS — R29898 Other symptoms and signs involving the musculoskeletal system: Secondary | ICD-10-CM

## 2020-04-13 DIAGNOSIS — R293 Abnormal posture: Secondary | ICD-10-CM | POA: Diagnosis not present

## 2020-04-13 DIAGNOSIS — M546 Pain in thoracic spine: Secondary | ICD-10-CM

## 2020-04-13 NOTE — Patient Instructions (Signed)
Access Code: [email or web address] URL: [email or web address] Date: 04/13/2020 Prepared by: Moshe Cipro  Exercises Sidelying Thoracic Rotation with Open Book - 2 x daily - 7 x weekly - 1 sets - 5 reps - 10-15 sec hold Latissimus Dorsi Stretch at Wall - 2 x daily - 7 x weekly - 1 sets - 3 reps - 30 sec hold Doorway Pec Stretch at 90 Degrees Abduction - 2 x daily - 7 x weekly - 1 sets - 3 reps - 30 sec hold Standing Shoulder External Rotation with Resistance - 1 x daily - 7 x weekly - 1 sets - 10 reps - 1-2 sec hold Standing Shoulder Flexion to 90 Degrees with Dumbbells - 1 x daily - 7 x weekly - 3 sets - 10 reps Shoulder Abduction with Dumbbells - Thumbs Up - 1 x daily - 7 x weekly - 3 sets - 10 reps

## 2020-04-13 NOTE — Therapy (Signed)
[facility] [address] [place], [place], [address] Phone: [phone]   Fax:  [phone]  Physical Therapy Evaluation  Patient Details  Name: Maria Smith MRN: [id number] Date of Birth: 03-14-1996 Referring Provider (PT): Fuller Plan, MD   Encounter Date: 04/13/2020   PT End of Session - 04/13/20 1105    Visit Number 1    Number of Visits 6    Date for PT Re-Evaluation 05/25/20    PT Start Time 0802    PT Stop Time 0842    PT Time Calculation (min) 40 min    Activity Tolerance Patient tolerated treatment well    Behavior During Therapy [facility] for tasks assessed/performed           Past Medical History:  Diagnosis Date  . Allergic rhinitis   . Allergy    SEASONAL  . Asthma   . Eczema   . Environmental allergies    Age 24 years  . Fatty liver 12/11/2018   Noted on CT 6/20  . Generalized headaches    Began at age 63 years  . GERD (gastroesophageal reflux disease)   . Influenza    Age 72 years  . 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 Streptococcal infection(041.00)    Age 33 and 24 years old  . TB lung, latent    2017  . 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 TONSILLECTOMY  2012  . WISDOM TOOTH EXTRACTION      There were no vitals filed for this visit.    Subjective Assessment - 04/13/20 0806    Subjective Pt is a 24 y/o female who presents to OPPT for Lt sided chest wall pain.  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  She reports holding 83 month old twins is painful with baby's head lying on her chest.    Limitations Lifting    Patient Stated Goals improve pain, learn how to manage pain    Currently in Pain? Yes    Pain Score 8    up to 10/10; at best 4/10   Pain Location Rib cage    Pain Orientation Left    Pain Descriptors / Indicators  Sharp;Aching   pinching   Pain Type Chronic pain    Pain Onset More than a month ago    Pain Frequency Constant    Aggravating Factors  pressure on ant chest; quick movements, lifting anything > 10-15#    Pain Relieving Factors stretching in warm bath              [facility] PT Assessment - 04/13/20 0810      Assessment   Medical Diagnosis R07.89 (ICD-10-CM) - Left-sided chest wall pain    Referring Provider (PT) Fuller Plan, MD    Onset Date/Surgical Date --   18 months   Hand Dominance Right    Next MD Visit PRN    Prior Therapy none      Precautions   Precautions None      Restrictions   Weight Bearing Restrictions No      Balance Screen   Has the patient fallen in the past 6 months No    Has the patient had a decrease in activity level because of a fear of falling?  No  Is the patient reluctant to leave their home because of a fear of falling?  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, standing most of the time    Leisure spend time with twins, reading, writing poetry, no regular exercise      Cognition   Overall Cognitive Status Within Functional Limits for tasks assessed      Observation/Other Assessments   Focus on Therapeutic Outcomes (FOTO)  51 (predicted 65)      Posture/Postural Control   Posture/Postural Control No significant limitations      ROM / Strength   AROM / PROM / Strength AROM;Strength      AROM   Overall AROM Comments bil UEs and thoracolumbar all WNL - pain with all movements      Strength   Overall Strength Comments giveway weakness noted with testing; pain reported with all testing    Strength Assessment Site Shoulder     Right/Left Shoulder Right;Left    Right Shoulder Flexion 3+/5    Right Shoulder ABduction 4/5    Right Shoulder Internal Rotation 4/5    Right Shoulder External Rotation 5/5    Left Shoulder Flexion 3+/5    Left Shoulder ABduction 3+/5    Left Shoulder Internal Rotation 3+/5    Left Shoulder External Rotation 3+/5      Palpation   Palpation comment diffuse tenderness and pain to light palpation along anterior chest wall/pecs, lateral ribs/lats/rhomboids/subscap                      Objective measurements completed on examination: See above findings.       [facility] Adult PT Treatment/Exercise - 04/13/20 0810      Exercises   Exercises Shoulder      Shoulder Exercises: Standing   External Rotation Both;10 reps;Theraband    Theraband Level (Shoulder External Rotation) Level 3 (Green)    Flexion Both;10 reps;Weights    Shoulder Flexion Weight (lbs) 2    ABduction Both;10 reps;Weights    Shoulder ABduction Weight (lbs) 2      Shoulder Exercises: Stretch   Other Shoulder Stretches mid doorway stretch x 30 sec - cues for technique    Other Shoulder Stretches lat stretch at wall on Lt x 30 sec; sidelying book opener 2x30 sec                  PT Education - 04/13/20 1104    Education Details HEP    Person(s) Educated Patient    Methods Explanation;Demonstration;Handout    Comprehension Verbalized understanding;Returned demonstration;Need further instruction               PT Long Term Goals - 04/13/20 1247      PT LONG TERM GOAL #1   Title independent with HEP    Status New    Target Date 05/25/20      PT LONG TERM GOAL #2   Title perform thoracolumbar ROM without increase in pain for improved function    Status New    Target Date 05/25/20      PT LONG TERM GOAL #3   Title FOTO score  improved to 65 for improved function    Status New    Target Date 05/25/20      PT LONG TERM GOAL #4   Title demonstrate proper lifting techniques to reduce  risk of reinjury    Status New    Target Date 05/25/20      PT LONG TERM GOAL #5   Title report 50% improvement in caring for twin babies for improved function    Status New    Target Date 05/25/20                  Plan - 04/13/20 1244    Clinical Impression Statement Pt is a 24 y/o female who presents to OPPT for Lt sided chest, flank and thoracic spine pain.  Pt demonstrates decreased UE strength and is tender to light palpation with all areas of pain.  Overall will benefit from PT to address deficits listed.    Personal Factors and Comorbidities Comorbidity 3+;Time since onset of injury/illness/exacerbation    Comorbidities positive ANA, asthma, hx TB, PCOS, migraines    Examination-Activity Limitations Locomotion Level;Transfers;Reach Overhead;Caring for Others;Carry;Lift    Examination-Participation Restrictions Community Activity;Occupation;Meal Prep;Cleaning;Laundry    Stability/Clinical Decision Making Evolving/Moderate complexity    Clinical Decision Making Moderate    Rehab Potential Good    PT Frequency 1x / week    PT Duration 6 weeks    PT Treatment/Interventions ADLs/Self Care Home Management;Cryotherapy;Electrical Stimulation;Ultrasound;Moist Heat;Traction;Functional mobility training;Therapeutic activities;Therapeutic exercise;Neuromuscular re-education;Patient/family education;Manual techniques;Dry needling;Taping    PT Next Visit Plan review HEP, manual/modalities/?DN (may not be able to tolerate)    PT Home Exercise Plan Access Code: [email or web address]    Consulted and Agree with Plan of Care Patient           Patient will benefit from skilled therapeutic intervention in order to improve the following deficits and impairments:  Increased fascial restricitons, Increased muscle spasms, Pain, Improper body mechanics, Postural dysfunction, Decreased range of motion, Decreased strength  Visit Diagnosis: Pain in thoracic spine - Plan: PT plan of care cert/re-cert  Other  symptoms and signs involving the musculoskeletal system - Plan: PT plan of care cert/re-cert  Abnormal posture - Plan: PT plan of care cert/re-cert  Muscle weakness (generalized) - Plan: PT plan of care cert/re-cert     Problem List Patient Active Problem List   Diagnosis Date Noted  . Left-sided chest wall pain 03/23/2020  . Positive ANA (antinuclear antibody) 03/23/2020  . Asthma 02/10/2020  . Allergic rhinitis 02/10/2020  . GERD (gastroesophageal reflux disease) 02/10/2020  . History of TB (tuberculosis) 08/21/2019  . Throat pain 01/28/2019  . Odynophagia 01/28/2019  . Neck pain 01/28/2019  . Thyroiditis 01/12/2019  . 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/2018  . Fatty liver 12/11/2018  . PCOS (polycystic ovarian syndrome) 11/11/2018  . Seasonal allergies 09/22/2018  . 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 Migraine with aura and without status migrainosus, not intractable 09/11/2012  . Variants of migraine, not elsewhere classified, without mention of intractable migraine without mention of status migrainosus 09/11/2012  . Unspecified constipation 09/11/2012  . Syncope 08/31/2010  . Neck Pain 08/31/2010  . Insomnia 08/31/2010        Clarita Crane, PT, DPT 04/13/20 12:52 PM       [facility] Physical Therapy [address] [place], [place], [address] Phone: [phone]   Fax:  [phone]  Name: Maria Smith MRN: [id number] Date of Birth: [birth year]

## 2020-04-14 ENCOUNTER — Ambulatory Visit: Payer: No Typology Code available for payment source | Admitting: Internal Medicine

## 2020-04-16 ENCOUNTER — Emergency Department: Payer: No Typology Code available for payment source

## 2020-04-16 ENCOUNTER — Other Ambulatory Visit: Payer: Self-pay

## 2020-04-16 ENCOUNTER — Encounter: Payer: Self-pay | Admitting: Emergency Medicine

## 2020-04-16 ENCOUNTER — Emergency Department
Admission: EM | Admit: 2020-04-16 | Discharge: 2020-04-16 | Disposition: A | Discharge status: Home / Self Care | Payer: No Typology Code available for payment source | Attending: Emergency Medicine | Admitting: Emergency Medicine

## 2020-04-16 DIAGNOSIS — R1012 Left upper quadrant pain: Secondary | ICD-10-CM | POA: Insufficient documentation

## 2020-04-16 DIAGNOSIS — Z5321 Procedure and treatment not carried out due to patient leaving prior to being seen by health care provider: Secondary | ICD-10-CM | POA: Diagnosis not present

## 2020-04-16 DIAGNOSIS — R1013 Epigastric pain: Secondary | ICD-10-CM | POA: Diagnosis not present

## 2020-04-16 LAB — COMPREHENSIVE METABOLIC PANEL
ALT: 20 U/L (ref 0–44)
AST: 27 U/L (ref 15–41)
Albumin: 4.1 g/dL (ref 3.5–5.0)
Alkaline Phosphatase: 43 U/L (ref 38–126)
Anion gap: 6 (ref 5–15)
BUN: 9 mg/dL (ref 6–20)
CO2: 27 mmol/L (ref 22–32)
Calcium: 9.4 mg/dL (ref 8.9–10.3)
Chloride: 104 mmol/L (ref 98–111)
Creatinine, Ser: 0.7 mg/dL (ref 0.44–1.00)
GFR, Estimated: >60 mL/min (ref >60)
Glucose, Bld: 88 mg/dL (ref 70–99)
Potassium: 3.9 mmol/L (ref 3.5–5.1)
Sodium: 137 mmol/L (ref 135–145)
Total Bilirubin: 0.8 mg/dL (ref 0.3–1.2)
Total Protein: 7.2 g/dL (ref 6.5–8.1)

## 2020-04-16 LAB — CBC
HCT: 39 % (ref 36.0–46.0)
Hemoglobin: 12.9 g/dL (ref 12.0–15.0)
MCH: 28.8 pg (ref 26.0–34.0)
MCHC: 33.1 g/dL (ref 30.0–36.0)
MCV: 87.1 fL (ref 80.0–100.0)
Platelets: 298 10*3/uL (ref 150–400)
RBC: 4.48 MIL/uL (ref 3.87–5.11)
RDW: 12.7 % (ref 11.5–15.5)
WBC: 6.9 10*3/uL (ref 4.0–10.5)
nRBC: 0 % (ref 0.0–0.2)

## 2020-04-16 LAB — LIPASE, BLOOD: Lipase: 58 U/L — ABNORMAL HIGH (ref 11–51)

## 2020-04-16 NOTE — ED Triage Notes (Signed)
Pt called from [facility] to treatment room, no response 

## 2020-04-16 NOTE — ED Notes (Signed)
Pt called x's 3, no response ?

## 2020-04-16 NOTE — ED Notes (Signed)
Patient transported to Ultrasound 

## 2020-04-16 NOTE — ED Triage Notes (Signed)
Patient with complaint of intermittent left upper abdominal pain times one week. Patient states that the today the pain has become worse and constant. Patient denies nausea, vomiting or shortness of breath.

## 2020-04-19 ENCOUNTER — Telehealth: Payer: Self-pay | Admitting: Emergency Medicine

## 2020-04-19 NOTE — Telephone Encounter (Signed)
Called patient due to left prior to provider exam to inquire about condition and follow up plans. Left message. 

## 2020-04-20 ENCOUNTER — Other Ambulatory Visit (HOSPITAL_COMMUNITY): Payer: Self-pay | Admitting: *Deleted

## 2020-04-20 ENCOUNTER — Encounter: Payer: Self-pay | Admitting: Physician Assistant

## 2020-04-20 ENCOUNTER — Ambulatory Visit (INDEPENDENT_AMBULATORY_CARE_PROVIDER_SITE_OTHER): Payer: No Typology Code available for payment source | Admitting: Physician Assistant

## 2020-04-20 ENCOUNTER — Encounter: Payer: No Typology Code available for payment source | Admitting: Physical Therapy

## 2020-04-20 VITALS — BP 100/60 | HR 91 | Ht <= 58 in | Wt 105.0 lb

## 2020-04-20 DIAGNOSIS — K219 Gastro-esophageal reflux disease without esophagitis: Secondary | ICD-10-CM

## 2020-04-20 DIAGNOSIS — R198 Other specified symptoms and signs involving the digestive system and abdomen: Secondary | ICD-10-CM

## 2020-04-20 DIAGNOSIS — R1310 Dysphagia, unspecified: Secondary | ICD-10-CM

## 2020-04-20 DIAGNOSIS — R070 Pain in throat: Secondary | ICD-10-CM | POA: Diagnosis not present

## 2020-04-20 DIAGNOSIS — R0989 Other specified symptoms and signs involving the circulatory and respiratory systems: Secondary | ICD-10-CM

## 2020-04-20 NOTE — Patient Instructions (Addendum)
If you are age 24 or older, your body mass index should be between 23-30. Your Body mass index is 21.95 kg/m. If this is out of the aforementioned range listed, please consider follow up with your Primary Care Provider.  If you are age 39 or younger, your body mass index should be between 19-25. Your Body mass index is 21.95 kg/m. If this is out of the aformentioned range listed, please consider follow up with your Primary Care Provider.   Continue Omeprazole twice daily.  You have been scheduled for a modified barium swallow on Wednesday 04/27/20 at 11:30 am. Please arrive 15 minutes prior to your test for registration. You will go to [facility] (1st Floor) for your appointment. Should you need to cancel or reschedule your appointment, please contact [phone] Maria Smith) or [phone] Maria Smith). _______________________________________________________________ A Modified Barium Swallow Study, or MBS, is a special x-ray that is taken to check swallowing skills. It is carried out by a Marine scientist and a Warehouse manager (SLP). During this test, yourmouth, throat, and esophagus, a muscular tube which connects your mouth to your stomach, is checked. The test will help you, your doctor, and the SLP plan what types of foods and liquids are easier for you to swallow. The SLP will also identify positions and ways to help you swallow more easily and safely. What will happen during an MBS? You will be taken to an x-ray room and seated comfortably. You will be asked to swallow small amounts of food and liquid mixed with barium. Barium is a liquid or paste that allows images of your mouth, throat and esophagus to be seen on x-ray. The x-ray captures moving images of the food you are swallowing as it travels from your mouth through your throat and into your esophagus. This test helps identify whether food or liquid is entering your lungs (aspiration). The test also shows which part  of your mouth or throat lacks strength or coordination to move the food or liquid in the right direction. This test typically takes 30 minutes to 1 hour to complete. ______________________________________________________________

## 2020-04-20 NOTE — Progress Notes (Signed)
Reviewed and agree with management plans. ? ?Gabreille Dardis L. Arita Severtson, MD, MPH  ?

## 2020-04-20 NOTE — Progress Notes (Signed)
Chief Complaint: Dysphagia  HPI:    [name] is a 24 year old African-American female, known to Dr. Orvan Falconer, with a past medical history of asthma and allergies as listed below, who was referred to me by Deeann Saint, MD for a complaint of dysphagia.      08/05/2019 patient seen in clinic by Dr. Orvan Falconer for a globus sensation.  At that time discussed globus without associated pain, dysphagia, odynophagia, weight loss or change in voice.  Is recommend she is omeprazole 40 mg twice daily for crushable form and have an EGD.    08/26/2019 EGD with gastritis and esophagitis.  Recommend she continue omeprazole 40 mg twice daily.    02/16/2020 laryngoscopy which was normal.    Today, the patient presents to clinic and tells me that she continues with some esophageal pain and a sensation like something is "on the right side of my throat".  Tells me when she swallows it also feels like it goes toward the right side and is sometimes painful.  This is so severe that she cannot eat foods that she wants like to eat including noodles or stringy things.  She has to mainly have soft foods.  Tells me that she lost a bunch of weight over this past year and currently is maintaining her weight with boost and Ensure shakes.  "This is nothing like I used to be, I used to be overweight".  Tells me that she has not consistently use her Omeprazole 40 mg twice daily.    Social history positive for having 35-month-old twins at home, a boy and a girl.    Denies heartburn, reflux or change in bowel habits.  Past Medical History:  Diagnosis Date  . Allergic rhinitis   . Allergy    SEASONAL  . Asthma   . Eczema   . Environmental allergies    Age 71 years  . Fatty liver 12/11/2018   Noted on CT 6/20  . Generalized headaches    Began at age 69 years  . GERD (gastroesophageal reflux disease)   . Influenza    Age 89 years  . 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 Streptococcal infection(041.00)     Age 29 and 24 years old  . TB lung, latent    2017  . 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 TONSILLECTOMY  2012  . WISDOM TOOTH EXTRACTION      Current Outpatient Medications  Medication Sig Dispense Refill  . albuterol (VENTOLIN HFA) 108 (90 Base) MCG/ACT inhaler Inhale 2 puffs into the lungs as needed.    . Ascorbic Acid (VITAMIN C) 1000 MG tablet Take 1,000 mg by mouth daily.    . fluticasone (FLONASE) 50 MCG/ACT nasal spray Place 2 sprays into both nostrils daily. 16 g 2  . Multiple Vitamin (MULTIVITAMIN) tablet Take 1 tablet by mouth daily.    Marland Kitchen omeprazole (PRILOSEC) 40 MG capsule Take 1 capsule (40 mg total) by mouth 2 (two) times daily. (Patient taking differently: Take 40 mg by mouth as needed. ) 60 capsule 3   No current facility-administered medications for this visit.    Allergies as of 04/20/2020  . (No Known Allergies)    Family History  Problem Relation Age of Onset  . Migraines Paternal Grandmother   . Migraines Paternal Grandfather   . HIV Father   . Diabetes Mother   .  Hypertension Mother   . Anemia Mother   . COPD Mother   . CVA Mother   . Congestive Heart Failure Mother   . Cirrhosis Mother   . Obesity Mother   . Irritable bowel syndrome Mother   . Rheum arthritis Mother   . Ulcers Brother   . Anemia Sister   . Cholecystitis Brother   . Migraines Paternal Aunt   . Migraines Maternal Uncle        Maternal Great Uncle  . Diabetes Other        Family History  . Hypertension Other        Family History  . Depression Other        Family History  . Asthma Other        Family History  . Allergic rhinitis Other        Family History  . Asthma Brother   . Colon cancer Neg Hx   . Colon polyps Neg Hx   . Esophageal cancer Neg Hx   . Rectal cancer Neg Hx   . Stomach cancer Neg Hx     Social History   Socioeconomic History  . Marital status: Married    Spouse name:  Not on file  . Number of children: 0  . Years of education: Not on file  . Highest education level: Associate degree: occupational, Scientist, product/process development, or vocational program  Occupational History  . Occupation: dietary  Tobacco Use  . Smoking status: Never Smoker  . Smokeless tobacco: Never Used  Vaping Use  . Vaping Use: Never used  Substance and Sexual Activity  . Alcohol use: Not Currently  . Drug use: No  . Sexual activity: Yes    Partners: Female    Comment: female partner  Other Topics Concern  . 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    Social Determinants of Health   Financial Resource Strain:   . Difficulty of Paying Living Expenses: Not on file  Food Insecurity:   . Worried About Programme researcher, broadcasting/film/video in the Last Year: Not on file  . Ran Out of Food in the Last Year: Not on file  Transportation Needs:   . Lack of Transportation (Medical): Not on file  . Lack of Transportation (Non-Medical): Not on file  Physical Activity:   . Days of Exercise per Week: Not on file  . Minutes of Exercise per Session: Not on file  Stress:   . Feeling of Stress : Not on file  Social Connections:   . Frequency of Communication with Friends and Family: Not on file  . Frequency of Social Gatherings with Friends and Family: Not on file  . Attends Religious Services: Not on file  . Active Member of Clubs or Organizations: Not on file  . Attends Banker Meetings: Not on file  . Marital Status: Not on file  Intimate Partner Violence:   . Fear of Current or Ex-Partner: Not on file  . Emotionally Abused: Not on file  . Physically Abused: Not on file  . Sexually Abused: Not on file    Review of Systems:    Constitutional: No fever or chills Cardiovascular: No chest pain   Respiratory: No SOB  Gastrointestinal: See HPI and otherwise negative   Physical Exam:  Vital signs: BP 100/60   Pulse 91   Ht 4\' 10"  (1.473 m)   Wt 105 lb (47.6 kg)   LMP 03/31/2020 (Exact Date)   BMI 21.95 kg/m   Constitutional:   Pleasant AA female appears to be in NAD, Well developed, Well nourished, alert and cooperative Respiratory: Respirations even and unlabored. Lungs clear to auscultation bilaterally.   No wheezes, crackles, or rhonchi.  Cardiovascular: Normal S1, S2. No MRG. Regular rate and rhythm. No peripheral edema, cyanosis or pallor.  Gastrointestinal:  Soft, nondistended, mild right sided abdominal pain, no rebound or guarding. Normal bowel sounds. No appreciable masses or hepatomegaly. Rectal:  Not performed.  Psychiatric: Demonstrates good judgement and reason without abnormal affect or behaviors.  RELEVANT LABS AND IMAGING: CBC    Component Value Date/Time   WBC 6.9 04/16/2020 0315   RBC 4.48 04/16/2020 0315   HGB 12.9 04/16/2020 0315   HGB 13.9 06/26/2017 1155   HCT 39.0 04/16/2020 0315   HCT 42.0 06/26/2017 1155   PLT 298 04/16/2020 0315   PLT 317 06/26/2017 1155   MCV 87.1 04/16/2020 0315   MCV 86 06/26/2017 1155   MCH 28.8 04/16/2020 0315   MCHC 33.1 04/16/2020 0315   RDW 12.7 04/16/2020 0315   RDW 14.9 06/26/2017 1155   LYMPHSABS 1,800 02/05/2020 1634   MONOABS 0.2 03/05/2019 0748   EOSABS 50 02/05/2020 1634   BASOSABS 20 02/05/2020 1634    CMP     Component Value Date/Time   NA 137 04/16/2020 0315   NA 140 06/26/2017 1155   K 3.9 04/16/2020 0315   CL 104 04/16/2020 0315   CO2 27 04/16/2020 0315   GLUCOSE 88 04/16/2020 0315   BUN 9 04/16/2020 0315   BUN 10 06/26/2017 1155   CREATININE 0.70 04/16/2020 0315   CREATININE 0.78 02/05/2020 1634   CALCIUM 9.4 04/16/2020 0315   PROT 7.2 04/16/2020 0315   PROT 7.3 06/26/2017 1155   ALBUMIN 4.1 04/16/2020 0315   ALBUMIN 4.6 06/26/2017 1155   AST 27 04/16/2020 0315   ALT 20 04/16/2020  0315   ALKPHOS 43 04/16/2020 0315   BILITOT 0.8 04/16/2020 0315   BILITOT 0.6 06/26/2017 1155   GFRNONAA >60 04/16/2020 0315   GFRNONAA 106 02/05/2020 1634   GFRAA >60 02/22/2020 0805   GFRAA 123 02/05/2020 1634    Assessment: 1.  Globus sensation: Uncertain etiology, EGD earlier this year with esophagitis, laryngoscopy recently with ENT with no acute findings 2.  Throat pain: Could be related to below, has been evaluated by ENT with no findings 3.  GERD: Recent EGD earlier this year with esophagitis  Plan: 1.  Recommend the patient use her Omeprazole capsules 40 mg twice daily as prescribed.  Explained that she can open these and spread the granules over applesauce. 2.  Scheduled the patient for a barium swallow with speech pathology and tablet. 3.  Patient to follow in clinic with Dr. 02/07/2020 in 1 to 2 months.  Orvan Falconer, PA-C [facility] 04/20/2020, 9:51 AM  Cc: 13/08/2019, MD

## 2020-04-27 ENCOUNTER — Ambulatory Visit (HOSPITAL_COMMUNITY): Payer: No Typology Code available for payment source

## 2020-04-27 ENCOUNTER — Encounter: Payer: No Typology Code available for payment source | Admitting: Physical Therapy

## 2020-04-27 ENCOUNTER — Encounter (HOSPITAL_COMMUNITY): Payer: No Typology Code available for payment source

## 2020-04-27 ENCOUNTER — Encounter: Payer: Self-pay | Admitting: Physical Therapy

## 2020-04-27 ENCOUNTER — Ambulatory Visit: Payer: No Typology Code available for payment source | Admitting: Physical Therapy

## 2020-04-27 ENCOUNTER — Other Ambulatory Visit: Payer: Self-pay

## 2020-04-27 DIAGNOSIS — R29898 Other symptoms and signs involving the musculoskeletal system: Secondary | ICD-10-CM

## 2020-04-27 DIAGNOSIS — R293 Abnormal posture: Secondary | ICD-10-CM

## 2020-04-27 DIAGNOSIS — M6281 Muscle weakness (generalized): Secondary | ICD-10-CM

## 2020-04-27 DIAGNOSIS — M546 Pain in thoracic spine: Secondary | ICD-10-CM | POA: Diagnosis not present

## 2020-04-27 NOTE — Therapy (Addendum)
[facility] [place], [place], [address] Phone: [phone]   Fax:  [phone]  Physical Therapy Treatment/Discharge Summary  Patient Details  Name: Maria Smith MRN: [id number] Date of Birth: 1995/11/25 Referring Provider (PT): Collier Salina, MD   Encounter Date: 04/27/2020   PT End of Session - 04/27/20 1339    Visit Number 2    Number of Visits 6    Date for PT Re-Evaluation 05/25/20    PT Start Time 1300    PT Stop Time 1339    PT Time Calculation (min) 39 min    Activity Tolerance Patient tolerated treatment well    Behavior During Therapy [facility] for tasks assessed/performed           Past Medical History:  Diagnosis Date  . Allergic rhinitis   . Allergy    SEASONAL  . Asthma   . Eczema   . Environmental allergies    Age 65 years  . Fatty liver 12/11/2018   Noted on CT 6/20  . Generalized headaches    Began at age 80 years  . GERD (gastroesophageal reflux disease)   . Influenza    Age 25 years  . 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 Streptococcal infection(041.00)    Age 5 and 24 years old  . TB lung, latent    2017  . 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 TONSILLECTOMY  2012  . WISDOM TOOTH EXTRACTION      There were no vitals filed for this visit.   Subjective Assessment - 04/27/20 1257    Subjective sore - reports she's trying to do exercises but the do hurt occasionally. any lifting is painful.  had relief for 4 days - thinks heat and epsom salt were beneficial    Limitations Lifting    Patient Stated Goals improve pain, learn how to manage pain    Currently in Pain? Yes    Pain Score 5     Pain Location Rib cage    Pain Orientation Left    Pain Descriptors / Indicators Aching;Sharp    Pain Type Chronic pain    Pain Onset More than a month ago    Pain Frequency Constant     Aggravating Factors  pressure on anterior chest, quick movements, lifting anything > 10-15#    Pain Relieving Factors stretching, heat                             [facility] Adult PT Treatment/Exercise - 04/27/20 1304      Exercises   Exercises Shoulder      Shoulder Exercises: Standing   External Rotation Both;Theraband;20 reps    Theraband Level (Shoulder External Rotation) Level 3 (Green)    Flexion Both;10 reps;Weights    Shoulder Flexion Weight (lbs) 2    ABduction Both;10 reps;Weights    Shoulder ABduction Weight (lbs) 2    Other Standing Exercises overhead press x10 reps; 2# bil      Shoulder Exercises: ROM/Strengthening   UBE (Upper Arm Bike) L2 x 6 min (3' each direction)    Lat Pull Limitations 10# 2x10    Cybex Row Limitations 10# 2x10      Shoulder Exercises: Stretch   Other Shoulder Stretches mid doorway stretch 3 x 30 sec - cues for technique  Other Shoulder Stretches lat stretch at wall on Lt 3 x 30 sec; sidelying book opener 10x10 sec bil; bil KTC 5x30 sec                       PT Long Term Goals - 04/27/20 1339      PT LONG TERM GOAL #1   Title independent with HEP    Status On-going    Target Date 05/25/20      PT LONG TERM GOAL #2   Title perform thoracolumbar ROM without increase in pain for improved function    Status On-going      PT LONG TERM GOAL #3   Title FOTO score improved to 65 for improved function    Status On-going      PT LONG TERM GOAL #4   Title demonstrate proper lifting techniques to reduce risk of reinjury    Status On-going      PT LONG TERM GOAL #5   Title report 50% improvement in caring for twin babies for improved function    Status On-going                 Plan - 04/27/20 1339    Clinical Impression Statement Pt continues to have Lt sided chest and flank pain, but has had periods of relief since initial eval. Session today focused on strengthening and stretching for this area.  Will  benefit from PT to maximize function.  All goals ongoing at this time.    Personal Factors and Comorbidities Comorbidity 3+;Time since onset of injury/illness/exacerbation    Comorbidities positive ANA, asthma, hx TB, PCOS, migraines    Examination-Activity Limitations Locomotion Level;Transfers;Reach Overhead;Caring for Others;Carry;Lift    Examination-Participation Restrictions Community Activity;Occupation;Meal Prep;Cleaning;Laundry    Stability/Clinical Decision Making Evolving/Moderate complexity    Rehab Potential Good    PT Frequency 1x / week    PT Duration 6 weeks    PT Treatment/Interventions ADLs/Self Care Home Management;Cryotherapy;Electrical Stimulation;Ultrasound;Moist Heat;Traction;Functional mobility training;Therapeutic activities;Therapeutic exercise;Neuromuscular re-education;Patient/family education;Manual techniques;Dry needling;Taping    PT Next Visit Plan continue with strengthening/stretching, manual/modalities/?DN (may not be able to tolerate)    PT Home Exercise Plan Access Code: [email or web address]    Consulted and Agree with Plan of Care Patient           Patient will benefit from skilled therapeutic intervention in order to improve the following deficits and impairments:  Increased fascial restricitons, Increased muscle spasms, Pain, Improper body mechanics, Postural dysfunction, Decreased range of motion, Decreased strength  Visit Diagnosis: Pain in thoracic spine  Other symptoms and signs involving the musculoskeletal system  Abnormal posture  Muscle weakness (generalized)     Problem List Patient Active Problem List   Diagnosis Date Noted  . Left-sided chest wall pain 03/23/2020  . Positive ANA (antinuclear antibody) 03/23/2020  . Asthma 02/10/2020  . Allergic rhinitis 02/10/2020  . GERD (gastroesophageal reflux disease) 02/10/2020  . History of TB (tuberculosis) 08/21/2019  . Throat pain 01/28/2019  . Odynophagia 01/28/2019  . Neck pain 01/28/2019  .  Thyroiditis 01/12/2019  . 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/2018  . Fatty liver 12/11/2018  . PCOS (polycystic ovarian syndrome) 11/11/2018  . Seasonal allergies 09/22/2018  . 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 Migraine with aura and without status migrainosus, not intractable 09/11/2012  . Variants of migraine, not elsewhere classified, without mention of intractable migraine without mention of status migrainosus 09/11/2012  . Unspecified constipation 09/11/2012  . Syncope 08/31/2010  . Neck Pain 08/31/2010  .  Insomnia 08/31/2010      Laureen Abrahams, PT, DPT 04/27/20 1:41 PM    [facility] [address] [place], [place], [address] Phone: [phone]   Fax:  [phone]  Name: Maria Smith MRN: [id number] Date of Birth: 1995-10-27    PHYSICAL THERAPY DISCHARGE SUMMARY  Visits from Start of Care: 2  Current functional level related to goals / functional outcomes: See above   Remaining deficits: See above; unknown   Education / Equipment: HEP  Plan: Patient agrees to discharge.  Patient goals were not met. Patient is being discharged due to not returning since the last visit.  ?????     Laureen Abrahams, PT, DPT 07/27/20 10:25 AM  [facility] [address] [place], [place], [address] Phone: [phone]   Fax:  [phone]

## 2020-05-04 ENCOUNTER — Ambulatory Visit (HOSPITAL_COMMUNITY)
Admission: RE | Admit: 2020-05-04 | Discharge: 2020-05-04 | Disposition: A | Discharge status: Home / Self Care | Payer: No Typology Code available for payment source | Source: Ambulatory Visit | Attending: Physician Assistant | Admitting: Physician Assistant

## 2020-05-04 ENCOUNTER — Other Ambulatory Visit: Payer: Self-pay

## 2020-05-04 ENCOUNTER — Encounter: Payer: No Typology Code available for payment source | Admitting: Physical Therapy

## 2020-05-04 DIAGNOSIS — R070 Pain in throat: Secondary | ICD-10-CM

## 2020-05-04 DIAGNOSIS — R198 Other specified symptoms and signs involving the digestive system and abdomen: Secondary | ICD-10-CM

## 2020-05-04 DIAGNOSIS — R1310 Dysphagia, unspecified: Secondary | ICD-10-CM | POA: Diagnosis not present

## 2020-05-04 DIAGNOSIS — K219 Gastro-esophageal reflux disease without esophagitis: Secondary | ICD-10-CM

## 2020-05-04 DIAGNOSIS — R0989 Other specified symptoms and signs involving the circulatory and respiratory systems: Secondary | ICD-10-CM

## 2020-05-04 NOTE — Progress Notes (Signed)
Modified Barium Swallow Progress Note  Patient Details  Name: Maria Smith MRN: [id number] Date of Birth: Apr 01, 1996  Today's Date: 05/04/2020  Modified Barium Swallow completed.  Full report located under Chart Review in the Imaging Section.  Brief recommendations include the following:  Clinical Impression  Pt was seen for MBS which revealed a functional oropharyngeal swallow. She demonstrated a timely oral transit and swallow initiation with strong pharyngeal constriction and hyolaryngeal excursion. No pharyngeal residue was observed after the swallow. Recommend pt continue with regular diet and thin liquids. Given pt's history of GERD and esophagitis, pt was educated on esophageal dysphagia. No SLP f/u is necessary.    Swallow Evaluation Recommendations       SLP Diet Recommendations: Regular solids;Thin liquid   Liquid Administration via: Cup;Straw   Medication Administration: Whole meds with liquid   Supervision: Patient able to self feed           Oral Care Recommendations: Oral care BID        Royetta Crochet 05/04/2020,12:39 PM

## 2020-05-11 ENCOUNTER — Encounter: Payer: No Typology Code available for payment source | Admitting: Physical Therapy

## 2020-05-18 ENCOUNTER — Encounter: Payer: No Typology Code available for payment source | Admitting: Physical Therapy

## 2020-06-08 ENCOUNTER — Ambulatory Visit (INDEPENDENT_AMBULATORY_CARE_PROVIDER_SITE_OTHER): Payer: No Typology Code available for payment source | Admitting: Gastroenterology

## 2020-06-08 ENCOUNTER — Encounter: Payer: Self-pay | Admitting: Gastroenterology

## 2020-06-08 DIAGNOSIS — R070 Pain in throat: Secondary | ICD-10-CM

## 2020-06-08 DIAGNOSIS — R1310 Dysphagia, unspecified: Secondary | ICD-10-CM | POA: Diagnosis not present

## 2020-06-08 DIAGNOSIS — K219 Gastro-esophageal reflux disease without esophagitis: Secondary | ICD-10-CM

## 2020-06-08 DIAGNOSIS — R0989 Other specified symptoms and signs involving the circulatory and respiratory systems: Secondary | ICD-10-CM

## 2020-06-08 DIAGNOSIS — R198 Other specified symptoms and signs involving the digestive system and abdomen: Secondary | ICD-10-CM | POA: Diagnosis not present

## 2020-06-08 NOTE — Patient Instructions (Addendum)
I would like you to see an ENT at [facility] to further evaluate your symptoms. In the meantime, we will try to get a CT scan of your neck approved.  I did not change your medications today.   If you are age 24 or younger, your body mass index should be between 19-25. Your Body mass index is 23.46 kg/m. If this is out of the aformentioned range listed, please consider follow up with your Primary Care Provider.   We have sent referral to [facility].  Someone from their office should contact your regarding your appointment.  Please contact our office if you have not heard from them in 1 to 2 weeks.   You have been scheduled for a CT scan of the neck with and without contrast at [facility], 1st floor Radiology. You are scheduled on 06-21-20  at 4:00pm. You should arrive 15 minutes prior to your appointment time for registration. Plan on being at [facility] for 45 minutes or longer, depending on the type of exam you are having performed.   If you have any questions regarding your exam or if you need to reschedule, you may call [facility] at [phone] between the hours of 8:00 am and 5:00 pm, Monday-Friday.   Due to recent changes in healthcare laws, you may see the results of your imaging and laboratory studies on MyChart before your provider has had a chance to review them.  We understand that in some cases there may be results that are confusing or concerning to you. Not all laboratory results come back in the same time frame and the provider may be waiting for multiple results in order to interpret others.  Please give [place] 48 hours in order for your provider to thoroughly review all the results before contacting the office for clarification of your results.   Thank you for entrusting me with your care and choosing [facility].  Dr Orvan Falconer

## 2020-06-08 NOTE — Progress Notes (Signed)
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  She has had an extensive evaluation including EGD, laryngoscopy, and MBS.  As the symptoms are localized just below the angle of the jaw I have recommended referral to ENT at [facility] for further investigation.  In the meantime will attempt to obtain a contrasted CT of the neck.  Given these symptoms esophageal manometry is low yield but could be considered in the future.   PLAN: Continue omeprazole 40 mg BID Neck CT with and without contrast Referral to ENT at [facility]  Please see the "Patient Instructions" section for addition details about the plan.  HPI: Maria Smith is a 24 y.o. female who returns in follow-up for evaluation of globus and dysphagia.  The interval history is obtained through the patient and review of her electronic health record.  She has a history of asthma, allergies, and reflux.  She has ongoing complaints of dysphagia characterized primarily by globus sensation and odynophagia. EGD 08/26/2019 showed chronic gastritis and reflux esophagitis.  Biopsies were negative for eosinophilic esophagitis and H. Pylori. Laryngoscopy performed by ENT Dr. Ezzard Standing 02/16/2020 to evaluate right sided throat pain was normal.  His recommendation was to follow-up with GI.  MBS 05/04/2020 revealed a functional oral pharyngeal swallow.  Speech pathology recommended no specific treatment.  She returns today  reporting ongoing globus described as a constant, painful sensation of a foreign body involving the right side of her throat.  She points to the right side of her neck just below the angle of the jaw and describes pain in this region when she swallows or eats.  The painful sensation occurs during and between meals.  Her heartburn is now well controlled on PPI therapy.  There is no regurgitation. Associated dysphagia primarily to solids, odynophagia, dysphonia, and an unspecified weight loss. There is no history of radiation to the head neck, smoking, or alcohol consumption. She notes a terrific appetite and a love of eating, but, her food intake has changed to try to minimize her symptoms. She notes that her baseline weight is 120-130. She is currently 106. She is forcing herself to drink Boost to get the calories.   No change in symptoms on omeprazole 40 mg BID or MOM PRN.   Her wife remembers that a fish bone may have been stuck 8-9 months ago. That was around the time her symptoms started.   Abdominal ultrasound for 1 day history of epigastric pain 04/16/2020 was normal    Past Medical History:  Diagnosis Date  . Allergic rhinitis   . Allergy    SEASONAL  . Asthma   . Eczema   . Environmental allergies    Age 89 years  . Fatty liver 12/11/2018   Noted on CT 6/20  . Generalized headaches    Began at age 43 years  . GERD (gastroesophageal reflux disease)   . Influenza    Age 24 years  . 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 Streptococcal infection(041.00)    Age 25 and 24 years old  . TB lung, latent    2017  . 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 TONSILLECTOMY  2012  . WISDOM TOOTH EXTRACTION      Current Outpatient Medications  Medication Sig Dispense Refill  . albuterol (VENTOLIN HFA) 108 (90 Base) MCG/ACT inhaler Inhale 2 puffs into the lungs as needed.    . Ascorbic Acid  (VITAMIN C) 1000 MG tablet Take 1,000 mg by mouth daily.    . fluticasone (FLONASE) 50 MCG/ACT nasal spray Place 2 sprays into both nostrils daily. 16 g 2  . Multiple Vitamin (MULTIVITAMIN) tablet Take 1 tablet by mouth daily.    Marland Kitchen omeprazole (PRILOSEC) 40 MG capsule Take 1 capsule (40 mg total) by mouth 2 (two) times daily. (Patient taking differently: Take 40 mg by mouth as needed.) 60 capsule 3   No current facility-administered medications for this visit.    Allergies as of 06/08/2020  . (No Known Allergies)    Family History  Problem Relation Age of Onset  . Migraines Paternal Grandmother   . Migraines Paternal Grandfather   . HIV Father   . Diabetes Mother   . Hypertension Mother   . Anemia Mother   . COPD Mother   . CVA Mother   . Congestive Heart Failure Mother   . Cirrhosis Mother   . Obesity Mother   . Irritable bowel syndrome Mother   . Rheum arthritis Mother   . Ulcers Brother   . Anemia Sister   . Cholecystitis Brother   . Migraines Paternal Aunt   . Migraines Maternal Uncle        Maternal Great Uncle  . Diabetes Other        Family History  . Hypertension Other        Family History  . Depression Other        Family History  . Asthma Other        Family History  . Allergic rhinitis Other        Family History  . Asthma Brother   . Colon cancer Neg Hx   . Colon polyps Neg Hx   . Esophageal cancer Neg Hx   . Rectal cancer Neg Hx   . Stomach cancer Neg Hx     Social History   Socioeconomic History  . Marital status: Married    Spouse name: Not on file  . Number of children: 2  . Years of education: Not on file  . Highest education level: Associate degree: occupational, Scientist, product/process development, or vocational program  Occupational History  . Occupation: dietary  Tobacco Use  . Smoking status: Never Smoker  . Smokeless tobacco: Never Used  Vaping Use  . Vaping Use: Never used  Substance and Sexual Activity  . Alcohol use: Not Currently  . Drug use: No   . Sexual activity: Yes    Partners: Female    Comment: female partner  Other Topics Concern  . 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    Social Determinants of Health   Financial Resource Strain:  Not on file  Food Insecurity: Not on file  Transportation Needs: Not on file  Physical Activity: Not on file  Stress: Not on file  Social Connections: Not on file  Intimate Partner Violence: Not on file    Physical Exam: General:   Alert,  well-nourished, pleasant and cooperative in NAD Head:  Normocephalic and atraumatic. Eyes:  Sclera clear, no icterus.   Conjunctiva pink. Ears:  Normal auditory acuity. Nose:  No deformity, discharge,  or lesions. Mouth:  No deformity or lesions.   Neck:  Supple; no masses or thyromegaly. No lymphadenopathy. Abdomen:  Soft, nontender, nondistended, normal bowel sounds, no rebound or guarding. No hepatosplenomegaly.   Neurologic:  Alert and  oriented x4;  grossly nonfocal Skin:  Intact without significant lesions or rashes. Psych:  Alert and cooperative. Normal mood and affect.     Maria Smith L. Orvan Falconer, MD, MPH 06/08/2020, 1:39 PM

## 2020-06-20 ENCOUNTER — Telehealth (INDEPENDENT_AMBULATORY_CARE_PROVIDER_SITE_OTHER): Payer: No Typology Code available for payment source | Admitting: Family Medicine

## 2020-06-20 DIAGNOSIS — J4521 Mild intermittent asthma with (acute) exacerbation: Secondary | ICD-10-CM | POA: Diagnosis not present

## 2020-06-20 DIAGNOSIS — R059 Cough, unspecified: Secondary | ICD-10-CM | POA: Diagnosis not present

## 2020-06-20 MED ORDER — BENZONATATE 100 MG PO CAPS: 100.0000 mg | ORAL_CAPSULE | Freq: Three times a day (TID) | ORAL | 0 refills | Status: DC | PRN

## 2020-06-20 MED ORDER — FLOVENT HFA 44 MCG/ACT IN AERO: 2.0000 | INHALATION_SPRAY | Freq: Two times a day (BID) | RESPIRATORY_TRACT | 0 refills | Status: DC

## 2020-06-20 NOTE — Patient Instructions (Signed)
   ---------------------------------------------------------------------------------------------------------------------------      WORK SLIP:  Patient Maria Smith,  [birth year], was seen for a medical visit today, 06/20/20 . Please excuse from work according to the [facility] guidelines for a COVID like illness. We advise 10 days minimum from the onset of symptoms (06/17/20) PLUS 1 day of no fever and improved symptoms. Will defer to employer for a sooner return to work if COVID19 testing is negative and the symptoms have resolved. Advise following CDC guidelines.  She/He may work remotely from home in self isolation if she is feeling better and wishes to do so.  Sincerely: E-signature: Dr. Kriste Basque, DO [facility] Ph: [phone]   ------------------------------------------------------------------------------------------------------------------------------   -I sent the medication(s) we discussed to your pharmacy: Meds ordered this encounter  Medications  . fluticasone (FLOVENT HFA) 44 MCG/ACT inhaler    Sig: Inhale 2 puffs into the lungs in the morning and at bedtime.    Dispense:  1 each    Refill:  0  . benzonatate (TESSALON PERLES) 100 MG capsule    Sig: Take 1 capsule (100 mg total) by mouth 3 (three) times daily as needed.    Dispense:  20 capsule    Refill:  0     I hope you are feeling better soon!  Seek in person care promptly if your symptoms worsen, new concerns arise or you are not improving with treatment.  It was nice to meet you today. I help [facility] out with telemedicine visits on Tuesdays and Thursdays and am available for visits on those days. If you have any concerns or questions following this visit please schedule a follow up visit with your Primary Care doctor or seek care at a local urgent care clinic to avoid delays in care.

## 2020-06-20 NOTE — Progress Notes (Signed)
Virtual Visit via Telephone Note  I connected with Maria Smith on 06/20/20 at  2:00 PM EST by telephone and verified that I am speaking with the correct person using two identifiers.   I discussed the limitations, risks, security and privacy concerns of performing an evaluation and management service by telephone and the availability of in person appointments. I also discussed with the patient that there may be a patient responsible charge related to this service. The patient expressed understanding and agreed to proceed.  Location patient: home, [place] Location provider: work or home office Participants present for the call: patient, provider Patient did not have a visit with me in the prior 7 days to address this/these issue(s).   History of Present Illness:  Acute telemedicine visit for cough: -Onset: 3 days ago -several coworkers tested positive for covid - she was is very close contact with these individuals with masking -she tested negative for covid x1 and has a 2nd test pending -Symptoms include: dull throbbing ha, cough, some asthma symptoms - mild chest tightness - not much better with her albuterol -O2 96 -Denies: cp, SOB, NVD, fever, body aches, inability to get out of bed eat or drink, denies worst ha or severe ha -Has tried: aleve -Pertinent past medical history:asthma, migraines - reports has required prednisone and steroid inhalers for asthma in the past - she prefers to use a steroid inhaler rather than prednisone due to side effects -Pertinent medication allergies: nkda -denies any chance of pregnancy -COVID-19 vaccine status: fully vaccinated   Observations/Objective: Patient sounds cheerful and well on the phone. I do not appreciate any SOB. Speech and thought processing are grossly intact. Patient reported vitals:  Assessment and Plan:  Cough  Mild intermittent asthma with acute exacerbation  -we discussed possible serious and likely etiologies,  options for evaluation and workup, limitations of telemedicine visit vs in person visit, treatment, treatment risks and precautions. Pt prefers to treat via telemedicine empirically rather than in person at this moment.  Query viral upper respiratory illness with asthma exacerbation, COVID-19 breakthrough case, versus other.  She did have 1 - Covid test and has a second test pending.  She opted for treatment with addition of Flovent for her asthma but is not quite responding enough to use her albuterol per her report.  She reports this is worked best for her in the past and she has issues with her asthma.  Tessalon for cough was also sent to her pharmacy.  Discussed other treatment options, potential complications, precautions and isolation.  Advised low threshold to seek in person care if worsening or symptoms or not responding to treatment. Work/School slipped offered: provided in patient instructions   Scheduled follow up with PCP offered: She agrees to follow-up if needed Advised to seek prompt in person care if worsening, new symptoms arise, or if is not improving with treatment. Advised of options for inperson care in case PCP office not available. Did let the patient know that I only do telemedicine shifts for [place] on Tuesdays and Thursdays and advised a follow up visit with PCP or at an [facility] if has further questions or concerns.   Follow Up Instructions:  I did not refer this patient for an OV with me in the next 24 hours for this/these issue(s).  I discussed the assessment and treatment plan with the patient. The patient was provided an opportunity to ask questions and all were answered. The patient agreed with the plan and demonstrated an understanding  of the instructions.   I spent 21 minutes on the date of this visit in the care of this patient. See summary of tasks completed to properly care for this patient in the detailed notes above which often include previsit review of recent office  visit notes, review of PMH, medications, allergies, evaluation of the patient and ordering and instructing patient on testing and care options.     Terressa Koyanagi, DO

## 2020-06-21 ENCOUNTER — Ambulatory Visit (HOSPITAL_COMMUNITY): Payer: No Typology Code available for payment source

## 2020-06-21 ENCOUNTER — Telehealth: Payer: Self-pay | Admitting: Gastroenterology

## 2020-06-21 NOTE — Telephone Encounter (Signed)
Left message on voicemail for patient to return call.  I called to [facility] and was advised that patient was scheduled with Dr Christell Constant on 06-22-2020 at 8:50am, but patient called and canceled appointment today.  Will await return call from patient to find out if there is anything else I can assist her with.

## 2020-06-21 NOTE — Telephone Encounter (Signed)
Pt is requesting a call back from a nurse to discuss which ENT specialist is she being referred to.

## 2020-06-24 NOTE — Telephone Encounter (Signed)
Left message for patient to return call regarding referral.  Will continue efforts.

## 2020-06-24 NOTE — Telephone Encounter (Signed)
Patient returned call to office and stated that she has [employer] and it would not cover visit at ENT [facility].  She prefers to have CT Neck appointment scheduled for 06-29-2020.  Depending on findings of CT neck she will decide on an appointment with ENT.  Patient agreed to plan and verbalized understanding.  No further questions.

## 2020-06-29 ENCOUNTER — Other Ambulatory Visit: Payer: Self-pay | Admitting: Gastroenterology

## 2020-06-29 ENCOUNTER — Other Ambulatory Visit: Payer: Self-pay

## 2020-06-29 ENCOUNTER — Telehealth: Payer: Self-pay | Admitting: Gastroenterology

## 2020-06-29 ENCOUNTER — Ambulatory Visit (HOSPITAL_COMMUNITY)
Admission: RE | Admit: 2020-06-29 | Discharge: 2020-06-29 | Disposition: A | Discharge status: Home / Self Care | Payer: No Typology Code available for payment source | Source: Ambulatory Visit | Attending: Gastroenterology | Admitting: Gastroenterology

## 2020-06-29 DIAGNOSIS — R070 Pain in throat: Secondary | ICD-10-CM | POA: Diagnosis present

## 2020-06-29 DIAGNOSIS — R1310 Dysphagia, unspecified: Secondary | ICD-10-CM | POA: Diagnosis not present

## 2020-06-29 MED ORDER — IOHEXOL 300 MG/ML  SOLN
75.0000 mL | Freq: Once | INTRAMUSCULAR | Status: AC | PRN
  Administered 2020-06-29: 75 mL via INTRAVENOUS

## 2020-06-29 NOTE — Telephone Encounter (Signed)
Radiology feels CT of neck should just be with contrast. Tech given phone number for Dr. Orvan Falconer to see if that is ok with Dr. Orvan Falconer.

## 2020-07-27 NOTE — Telephone Encounter (Signed)
Pt called requesting referral for ENT Dr. Janeece Riggers Philomena Doheny. Pt said that he is in-network. His office number is [phone].

## 2020-07-28 NOTE — Telephone Encounter (Signed)
Patient aware that referral was sent to Dr Avel Sensor office at fax #[phone].  Patient advised if she has not heard from Dr Avel Sensor office in 2 to 3 weeks to please contact our office to make [place] aware so we can follow up on the request.  Patient agreed to plan and verbalized understanding.  No further questions.

## 2020-08-08 ENCOUNTER — Emergency Department (HOSPITAL_COMMUNITY)
Admission: EM | Admit: 2020-08-08 | Discharge: 2020-08-08 | Disposition: A | Discharge status: Home / Self Care | Payer: No Typology Code available for payment source | Attending: Emergency Medicine | Admitting: Emergency Medicine

## 2020-08-08 ENCOUNTER — Ambulatory Visit (HOSPITAL_COMMUNITY)
Admission: EM | Admit: 2020-08-08 | Discharge: 2020-08-08 | Disposition: A | Discharge status: Home / Self Care | Payer: No Typology Code available for payment source | Attending: Student | Admitting: Student

## 2020-08-08 ENCOUNTER — Encounter (HOSPITAL_COMMUNITY): Payer: Self-pay | Admitting: Emergency Medicine

## 2020-08-08 ENCOUNTER — Encounter (HOSPITAL_COMMUNITY): Payer: Self-pay

## 2020-08-08 ENCOUNTER — Other Ambulatory Visit: Payer: Self-pay

## 2020-08-08 DIAGNOSIS — S61212A Laceration without foreign body of right middle finger without damage to nail, initial encounter: Secondary | ICD-10-CM

## 2020-08-08 DIAGNOSIS — S61312A Laceration without foreign body of right middle finger with damage to nail, initial encounter: Secondary | ICD-10-CM | POA: Diagnosis not present

## 2020-08-08 DIAGNOSIS — W268XXA Contact with other sharp object(s), not elsewhere classified, initial encounter: Secondary | ICD-10-CM | POA: Insufficient documentation

## 2020-08-08 DIAGNOSIS — S6991XA Unspecified injury of right wrist, hand and finger(s), initial encounter: Secondary | ICD-10-CM | POA: Diagnosis present

## 2020-08-08 DIAGNOSIS — Z5321 Procedure and treatment not carried out due to patient leaving prior to being seen by health care provider: Secondary | ICD-10-CM | POA: Insufficient documentation

## 2020-08-08 MED ORDER — LIDOCAINE HCL 2 % IJ SOLN
INTRAMUSCULAR | Status: AC
  Filled 2020-08-08: qty 20

## 2020-08-08 NOTE — ED Triage Notes (Signed)
Pt presents with laceration on right middle finger when opening a metal can. States having sharp and shooting pain in right hand.

## 2020-08-08 NOTE — ED Provider Notes (Addendum)
[facility]    CSN: [id number] Arrival date & time: 08/08/20  0805      History   Chief Complaint Chief Complaint  Patient presents with  . Laceration    HPI Maria Smith is a 25 y.o. female presenting with laceration to right middle finger. States she was opening a can of baby formula when it slipped, cutting the tip of her right middle finger. It initially bled substantially though this eventually stopped on its own. Since then she's been in significant pain. Denies sensation changes but states she can't move the tip of her finger. She is right handed.  HPI  Past Medical History:  Diagnosis Date  . Allergic rhinitis   . Allergy    SEASONAL  . Asthma   . Eczema   . Environmental allergies    Age 36 years  . Fatty liver 12/11/2018   Noted on CT 6/20  . Generalized headaches    Began at age 64 years  . GERD (gastroesophageal reflux disease)   . Influenza    Age 21 years  . 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 Streptococcal infection(041.00)    Age 63 and 25 years old  . TB lung, latent    2017  . 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/2018   Diffuse enlargement on [place] 6/20    Patient Active Problem List   Diagnosis Date Noted  . Left-sided chest wall pain 03/23/2020  . Positive ANA (antinuclear antibody) 03/23/2020  . Asthma 02/10/2020  . Allergic rhinitis 02/10/2020  . GERD (gastroesophageal reflux disease) 02/10/2020  . History of TB (tuberculosis) 08/21/2019  . Throat pain 01/28/2019  . Odynophagia 01/28/2019  . Neck pain 01/28/2019  . Thyroiditis 01/12/2019  . 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/2018  . Fatty liver 12/11/2018  . PCOS (polycystic ovarian syndrome) 11/11/2018  . Seasonal allergies 09/22/2018  . 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 Migraine with aura and without status migrainosus, not intractable 09/11/2012  . Variants of migraine, not elsewhere classified, without mention of intractable migraine without mention of status migrainosus 09/11/2012   . Unspecified constipation 09/11/2012  . Syncope 08/31/2010  . Neck Pain 08/31/2010  . Insomnia 08/31/2010    Past Surgical History:  Procedure Laterality Date  . TONSILLECTOMY  2012  . WISDOM TOOTH EXTRACTION      OB History    Gravida  0   Para  0   Term  0   Preterm  0   AB  0   Living  0     SAB  0   IAB  0   Ectopic  0   Multiple  0   Live Births  0            Home Medications    Prior to Admission medications   Medication Sig Start Date End Date Taking? Authorizing Provider  albuterol (VENTOLIN HFA) 108 (90 Base) MCG/ACT inhaler Inhale 2 puffs into the lungs as needed. 12/20/18   [provider]  Ascorbic Acid (VITAMIN C) 1000 MG tablet Take 1,000 mg by mouth daily.    [provider]  benzonatate (TESSALON PERLES) 100 MG capsule Take 1 capsule (100 mg total) by mouth 3 (three) times daily as needed. 06/20/20   Terressa Koyanagi, DO  fluticasone (FLONASE) 50 MCG/ACT nasal spray Place 2 sprays into both nostrils daily. 02/10/20   Leslye Peer, MD  fluticasone (FLOVENT HFA) 44 MCG/ACT inhaler Inhale 2 puffs into the lungs in the morning and  at bedtime. 06/20/20   Terressa Koyanagi, DO  Multiple Vitamin (MULTIVITAMIN) tablet Take 1 tablet by mouth daily.    [provider]  omeprazole (PRILOSEC) 40 MG capsule Take 1 capsule (40 mg total) by mouth 2 (two) times daily. Patient taking differently: Take 40 mg by mouth as needed. 07/03/19   Deeann Saint, MD  levocetirizine (XYZAL) 5 MG tablet TAKE 1 TABLET BY MOUTH EVERY DAY IN THE EVENING Patient not taking: No sig reported 04/15/19 10/23/19  Deeann Saint, MD    Family History Family History  Problem Relation Age of Onset  . Migraines Paternal Grandmother   . Migraines Paternal Grandfather   . HIV Father   . Diabetes Mother   . Hypertension Mother   . Anemia Mother   . COPD Mother   . CVA Mother   . Congestive Heart Failure Mother   . Cirrhosis Mother   . Obesity Mother   .  Irritable bowel syndrome Mother   . Rheum arthritis Mother   . Ulcers Brother   . Anemia Sister   . Cholecystitis Brother   . Migraines Paternal Aunt   . Migraines Maternal Uncle        Maternal Great Uncle  . Diabetes Other        Family History  . Hypertension Other        Family History  . Depression Other        Family History  . Asthma Other        Family History  . Allergic rhinitis Other        Family History  . Asthma Brother   . Colon cancer Neg Hx   . Colon polyps Neg Hx   . Esophageal cancer Neg Hx   . Rectal cancer Neg Hx   . Stomach cancer Neg Hx     Social History Social History   Tobacco Use  . Smoking status: Never Smoker  . Smokeless tobacco: Never Used  Vaping Use  . Vaping Use: Never used  Substance Use Topics  . Alcohol use: Not Currently  . Drug use: No     Allergies   Patient has no known allergies.   Review of Systems Review of Systems  Skin: Positive for wound.  All other systems reviewed and are negative.    Physical Exam Triage Vital Signs ED Triage Vitals  Enc Vitals Group     BP 08/08/20 0845 115/80     Pulse Rate 08/08/20 0845 83     Resp 08/08/20 0845 18     Temp 08/08/20 0845 99.3 F (37.4 C)     Temp Source 08/08/20 0845 Oral     SpO2 08/08/20 0845 98 %     Weight --      Height --      Head Circumference --      Peak Flow --      Pain Score 08/08/20 0844 10     Pain Loc --      Pain Edu? --      Excl. in GC? --    No data found.  Updated Vital Signs BP 115/80 (BP Location: Left Arm)   Pulse 83   Temp 99.3 F (37.4 C) (Oral)   Resp 18   LMP 07/07/2020 (Exact Date)   SpO2 98%   Visual Acuity Right Eye Distance:   Left Eye Distance:   Bilateral Distance:    Right Eye Near:   Left Eye Near:  Bilateral Near:     Physical Exam Vitals reviewed.  Constitutional:      Appearance: Normal appearance.  Cardiovascular:     Rate and Rhythm: Normal rate and regular rhythm.     Heart sounds: Normal  heart sounds.  Pulmonary:     Effort: Pulmonary effort is normal.     Breath sounds: Normal breath sounds.  Skin:    Comments: Distal dorsal R middle finger with 1cm laceration. Not actively bleeding. Vascularly intact, cap refill <2 seconds, no sensation changes. Pt unable to move DIP joint. No other wounds or lacerations.   Neurological:     General: No focal deficit present.     Mental Status: She is alert and oriented to person, place, and time.  Psychiatric:        Mood and Affect: Mood normal.        Behavior: Behavior normal.        Thought Content: Thought content normal.        Judgment: Judgment normal.      UC Treatments / Results  Labs (all labs ordered are listed, but only abnormal results are displayed) Labs Reviewed - No data to display  EKG   Radiology No results found.  Procedures Laceration Repair  Date/Time: 08/08/2020 11:15 AM Performed by: Rhys Martini, PA-C Authorized by: Rhys Martini, PA-C   Consent:    Consent obtained:  Verbal   Consent given by:  Patient   Risks, benefits, and alternatives were discussed: yes     Risks discussed:  Pain and infection   Alternatives discussed:  No treatment Universal protocol:    Procedure explained and questions answered to patient or proxy's satisfaction: yes     Patient identity confirmed:  Verbally with patient Laceration details:    Location:  Finger   Finger location:  R long finger   Length (cm):  1 Pre-procedure details:    Preparation:  Patient was prepped and draped in usual sterile fashion Exploration:    Hemostasis achieved with:  Direct pressure Treatment:    Area cleansed with:  Povidone-iodine   Irrigation solution:  Sterile saline   Debridement:  None Approximation:    Approximation:  Close Post-procedure details:    Dressing:  Antibiotic ointment and non-adherent dressing   Procedure completion:  Tolerated Comments:     3 simple interrupted sutures placed.    (including  critical care time)  Medications Ordered in UC Medications - No data to display  Initial Impression / Assessment and Plan / UC Course  I have reviewed the triage vital signs and the nursing notes.  Pertinent labs & imaging results that were available during my care of the patient were reviewed by me and considered in my medical decision making (see chart for details).    This patient is a 25 year old female presenting with laceration to right middle finger. Sutured as above. Wound care instructions provided.   Given decreased ROM of DIP joint, rec close follow-up with hand specialists; information provided, and she will call them today to schedule this. She verbalizes understanding and agreement.  Tetanus UTD.  Spent over 40 minutes obtaining H&P, performing physical, suturing and providing wound care, discussing results, treatment plan and plan for follow-up with patient. Patient agrees with plan.      Final Clinical Impressions(s) / UC Diagnoses   Final diagnoses:  Laceration of right middle finger without foreign body without damage to nail, initial encounter     Discharge Instructions     -  Keep your bandage on for 1 day.  -Starting tomorrow, wash your laceration with gentle soap and water 1-2x daily. You can apply antibiotic ointment on top. Wrap with gauze or bandage.  -Come back and see us in 7 days for suture removal.  -Call the hand specialist (information below) and schedule a follow-up with them in the next 1-3 days.     ED Prescriptions    None     PDMP not reviewed this encounter.   Rhys MartiniGraham, Jmya Uliano E, PA-C 08/08/20 0954    Rhys MartiniGraham, Giancarlos Berendt E, PA-C 08/08/20 1118    Rhys MartiniGraham, Chawn Spraggins E, PA-C 08/08/20 1119

## 2020-08-08 NOTE — Discharge Instructions (Signed)
-  Keep your bandage on for 1 day.  -Starting tomorrow, wash your laceration with gentle soap and water 1-2x daily. You can apply antibiotic ointment on top. Wrap with gauze or bandage.  -Come back and see [place] in 7 days for suture removal.  -Call the hand specialist (information below) and schedule a follow-up with them in the next 1-3 days.

## 2020-08-08 NOTE — ED Notes (Signed)
Pt did not to stay and left.

## 2020-08-08 NOTE — ED Triage Notes (Signed)
Pt cut her middle finger on her right hand when opening a metal can.  Bleeding controlled

## 2020-08-14 ENCOUNTER — Other Ambulatory Visit: Payer: Self-pay

## 2020-08-14 ENCOUNTER — Ambulatory Visit (HOSPITAL_COMMUNITY)
Admission: EM | Admit: 2020-08-14 | Discharge: 2020-08-14 | Discharge status: Absent without leave | Payer: No Typology Code available for payment source

## 2020-08-15 ENCOUNTER — Ambulatory Visit (HOSPITAL_COMMUNITY): Payer: Self-pay

## 2020-08-16 ENCOUNTER — Other Ambulatory Visit: Payer: Self-pay

## 2020-08-16 ENCOUNTER — Ambulatory Visit (HOSPITAL_COMMUNITY)
Admission: RE | Admit: 2020-08-16 | Discharge: 2020-08-16 | Disposition: A | Discharge status: Home / Self Care | Payer: No Typology Code available for payment source | Source: Ambulatory Visit | Attending: Family Medicine | Admitting: Family Medicine

## 2020-08-16 ENCOUNTER — Encounter (HOSPITAL_COMMUNITY): Payer: Self-pay

## 2020-08-16 NOTE — ED Triage Notes (Signed)
Patient is here for the removal of three sutures on her RT middle finger.

## 2020-08-24 ENCOUNTER — Telehealth: Payer: Self-pay

## 2020-08-24 DIAGNOSIS — R0789 Other chest pain: Secondary | ICD-10-CM

## 2020-08-24 NOTE — Telephone Encounter (Signed)
Patient called stating stating Dr. Dimple Casey referred her for physical therapy at [facility].  Patient states due to her busy work schedule was only able to attend 2 appointments.  Patient is requesting a referral to [facility] at [address], [address], [place].  Phone #[phone]

## 2020-08-24 NOTE — Telephone Encounter (Signed)
FYI

## 2020-08-25 NOTE — Telephone Encounter (Signed)
Attempted to contact patient to get further details regarding neuro rehabilitation referral.

## 2020-08-26 NOTE — Telephone Encounter (Signed)
Spoke with patient, she would like PT referral sent to [facility] Location because their appointment times are more conducive to her work schedule. Referral has been placed.

## 2020-08-26 NOTE — Addendum Note (Signed)
Addended by: Alysia Penna C on: 08/26/2020 01:45 PM   Modules accepted: Orders

## 2020-08-28 ENCOUNTER — Encounter (HOSPITAL_COMMUNITY): Payer: Self-pay | Admitting: Emergency Medicine

## 2020-08-28 ENCOUNTER — Emergency Department (HOSPITAL_COMMUNITY)
Admission: EM | Admit: 2020-08-28 | Discharge: 2020-08-28 | Disposition: A | Discharge status: Home / Self Care | Payer: No Typology Code available for payment source | Attending: Emergency Medicine | Admitting: Emergency Medicine

## 2020-08-28 ENCOUNTER — Other Ambulatory Visit: Payer: Self-pay

## 2020-08-28 DIAGNOSIS — J45909 Unspecified asthma, uncomplicated: Secondary | ICD-10-CM | POA: Diagnosis not present

## 2020-08-28 DIAGNOSIS — R110 Nausea: Secondary | ICD-10-CM | POA: Diagnosis not present

## 2020-08-28 DIAGNOSIS — R000 Tachycardia, unspecified: Secondary | ICD-10-CM | POA: Diagnosis not present

## 2020-08-28 DIAGNOSIS — Z79899 Other long term (current) drug therapy: Secondary | ICD-10-CM | POA: Diagnosis not present

## 2020-08-28 DIAGNOSIS — T40715A Adverse effect of cannabis, initial encounter: Secondary | ICD-10-CM | POA: Insufficient documentation

## 2020-08-28 DIAGNOSIS — F419 Anxiety disorder, unspecified: Secondary | ICD-10-CM | POA: Insufficient documentation

## 2020-08-28 DIAGNOSIS — T50905A Adverse effect of unspecified drugs, medicaments and biological substances, initial encounter: Secondary | ICD-10-CM

## 2020-08-28 MED ORDER — ONDANSETRON 4 MG PO TBDP
4.0000 mg | ORAL_TABLET | Freq: Once | ORAL | Status: AC
  Administered 2020-08-28: 4 mg via ORAL
  Filled 2020-08-28: qty 1

## 2020-08-28 MED ORDER — ONDANSETRON HCL 4 MG/2ML IJ SOLN
4.0000 mg | Freq: Once | INTRAMUSCULAR | Status: DC
  Filled 2020-08-28: qty 2

## 2020-08-28 MED ORDER — SODIUM CHLORIDE 0.9 % IV BOLUS: 1000.0000 mL | Freq: Once | INTRAVENOUS | Status: DC

## 2020-08-28 NOTE — ED Triage Notes (Signed)
Patient presents with wife for anxiety symptoms after using delta8 gummies and smoking a pre-roll. Patient endorsing anxiety and "feeling weird." Patient noted to be tremulous. Guided breathing initiated and helpful.

## 2020-08-28 NOTE — ED Notes (Signed)
Patient discharged. MD aware patient refused IV fluids and meds.

## 2020-08-28 NOTE — ED Provider Notes (Signed)
[facility]EM[facility]ENCY [facility]PT Provider Note   CSN: [id number] Arrival date & time: 08/28/20  0023     History Chief Complaint  Patient presents with  . Anxiety    S/p delta8 use    Maria Smith is a 25 y.o. female.  Patient presents to the emergency department with a chief complaint of anxiousness.  She ingested delta 8 Gummies prior to arrival.  She associates this with her symptoms.  She states that she feels nauseated.  She denies any treatment prior to arrival.  She denies any other associated symptoms.  She has never used these Gummies before.  The history is provided by the patient. No language interpreter was used.       Past Medical History:  Diagnosis Date  . Allergic rhinitis   . Allergy    SEASONAL  . Asthma   . Eczema   . Environmental allergies    Age 22 years  . Fatty liver 12/11/2018   Noted on CT 6/20  . Generalized headaches    Began at age 89 years  . GERD (gastroesophageal reflux disease)   . Influenza    Age 65 years  . 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 Streptococcal infection(041.00)    Age 32 and 25 years old  . TB lung, latent    2017  . 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/2018   Diffuse enlargement on [place] 6/20    Patient Active Problem List   Diagnosis Date Noted  . Left-sided chest wall pain 03/23/2020  . Positive ANA (antinuclear antibody) 03/23/2020  . Asthma 02/10/2020  . Allergic rhinitis 02/10/2020  . GERD (gastroesophageal reflux disease) 02/10/2020  . History of TB (tuberculosis) 08/21/2019  . Throat pain 01/28/2019  . Odynophagia 01/28/2019  . Neck pain 01/28/2019  . Thyroiditis 01/12/2019  . 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/2018  . Fatty liver 12/11/2018  . PCOS (polycystic ovarian syndrome) 11/11/2018  . Seasonal allergies 09/22/2018  . 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 Migraine with aura and without status migrainosus, not intractable 09/11/2012  . Variants of migraine, not elsewhere classified, without  mention of intractable migraine without mention of status migrainosus 09/11/2012  . Unspecified constipation 09/11/2012  . Syncope 08/31/2010  . Neck Pain 08/31/2010  . Insomnia 08/31/2010    Past Surgical History:  Procedure Laterality Date  . TONSILLECTOMY  2012  . WISDOM TOOTH EXTRACTION       OB History    Gravida  0   Para  0   Term  0   Preterm  0   AB  0   Living  0     SAB  0   IAB  0   Ectopic  0   Multiple  0   Live Births  0           Family History  Problem Relation Age of Onset  . Migraines Paternal Grandmother   . Migraines Paternal Grandfather   . HIV Father   . Diabetes Mother   . Hypertension Mother   . Anemia Mother   . COPD Mother   . CVA Mother   . Congestive Heart Failure Mother   . Cirrhosis Mother   . Obesity Mother   . Irritable bowel syndrome Mother   . Rheum arthritis Mother   . Ulcers Brother   . Anemia Sister   . Cholecystitis Brother   . Migraines Paternal Aunt   . Migraines Maternal Uncle  Maternal [employer]  . Diabetes Other        Family History  . Hypertension Other        Family History  . Depression Other        Family History  . Asthma Other        Family History  . Allergic rhinitis Other        Family History  . Asthma Brother   . Colon cancer Neg Hx   . Colon polyps Neg Hx   . Esophageal cancer Neg Hx   . Rectal cancer Neg Hx   . Stomach cancer Neg Hx     Social History   Tobacco Use  . Smoking status: Never Smoker  . Smokeless tobacco: Never Used  Vaping Use  . Vaping Use: Never used  Substance Use Topics  . Alcohol use: Not Currently  . Drug use: No    Home Medications Prior to Admission medications   Medication Sig Start Date End Date Taking? Authorizing Provider  albuterol (VENTOLIN HFA) 108 (90 Base) MCG/ACT inhaler Inhale 2 puffs into the lungs as needed. 12/20/18  Yes [provider]  fluticasone (FLOVENT HFA) 44 MCG/ACT inhaler Inhale 2 puffs into the lungs in  the morning and at bedtime. 06/20/20  Yes Terressa Koyanagi, DO  Ascorbic Acid (VITAMIN C) 1000 MG tablet Take 1,000 mg by mouth daily. Patient not taking: Reported on 08/28/2020    [provider]  benzonatate (TESSALON PERLES) 100 MG capsule Take 1 capsule (100 mg total) by mouth 3 (three) times daily as needed. Patient not taking: Reported on 08/28/2020 06/20/20   Terressa Koyanagi, DO  fluticasone [facility]) 50 MCG/ACT nasal spray Place 2 sprays into both nostrils daily. Patient not taking: Reported on 08/28/2020 02/10/20   Leslye Peer, MD  Multiple Vitamin (MULTIVITAMIN) tablet Take 1 tablet by mouth daily. Patient not taking: No sig reported    [provider]  omeprazole (PRILOSEC) 40 MG capsule Take 1 capsule (40 mg total) by mouth 2 (two) times daily. Patient not taking: Reported on 08/28/2020 07/03/19   Deeann Saint, MD  levocetirizine (XYZAL) 5 MG tablet TAKE 1 TABLET BY MOUTH EVERY DAY IN THE EVENING Patient not taking: No sig reported 04/15/19 10/23/19  Deeann Saint, MD    Allergies    Patient has no known allergies.  Review of Systems   Review of Systems  All other systems reviewed and are negative.   Physical Exam Updated Vital Signs BP (!) 132/91 (BP Location: Left Arm)   Pulse (!) 108   Temp 98.1 F (36.7 C) (Oral)   Resp 20   SpO2 100%   Physical Exam Vitals and nursing note reviewed.  Constitutional:      General: She is not in acute distress.    Appearance: She is well-developed.  HENT:     Head: Normocephalic and atraumatic.  Eyes:     Conjunctiva/sclera: Conjunctivae normal.  Cardiovascular:     Rate and Rhythm: Regular rhythm. Tachycardia present.     Heart sounds: No murmur heard.   Pulmonary:     Effort: Pulmonary effort is normal. No respiratory distress.     Breath sounds: Normal breath sounds.  Abdominal:     Palpations: Abdomen is soft.     Tenderness: There is no abdominal tenderness.  Musculoskeletal:        General: Normal  range of motion.     Cervical back: Neck supple.  Skin:  General: Skin is warm and dry.  Neurological:     Mental Status: She is alert and oriented to person, place, and time.  Psychiatric:        Mood and Affect: Mood normal.        Behavior: Behavior normal.     ED Results / Procedures / Treatments   Labs (all labs ordered are listed, but only abnormal results are displayed) Labs Reviewed - No data to display  EKG None  Radiology No results found.  Procedures Procedures   Medications Ordered in ED Medications  sodium chloride 0.9 % bolus 1,000 mL (has no administration in time range)  ondansetron (ZOFRAN) injection 4 mg (has no administration in time range)    ED Course  I have reviewed the triage vital signs and the nursing notes.  Pertinent labs & imaging results that were available during my care of the patient were reviewed by me and considered in my medical decision making (see chart for details).    MDM Rules/Calculators/A&P                          Patient here with anxiousness and nausea following ingestion of delta 8 Gummies.  She is still feeling nauseated and anxious.  She is still tachycardic, will give fluids and some nausea medicine and reassess.  She will likely just need to metabolize.  3:13 AM Patient now declines fluids and Zofran and request discharged with Zofran ODT. Final Clinical Impression(s) / ED Diagnoses Final diagnoses:  Adverse effect of drug, initial encounter    Rx / DC Orders ED Discharge Orders    None       Roxy Horseman, PA-C 08/28/20 0313    Dione Booze, MD 08/28/20 2239

## 2020-09-15 ENCOUNTER — Ambulatory Visit: Payer: No Typology Code available for payment source | Admitting: Physical Therapy

## 2020-09-19 ENCOUNTER — Other Ambulatory Visit: Payer: Self-pay

## 2020-09-19 ENCOUNTER — Ambulatory Visit (INDEPENDENT_AMBULATORY_CARE_PROVIDER_SITE_OTHER): Payer: No Typology Code available for payment source | Admitting: Family Medicine

## 2020-09-19 ENCOUNTER — Encounter: Payer: Self-pay | Admitting: Family Medicine

## 2020-09-19 VITALS — BP 124/70 | HR 88 | Ht <= 58 in | Wt 109.0 lb

## 2020-09-19 DIAGNOSIS — M7062 Trochanteric bursitis, left hip: Secondary | ICD-10-CM | POA: Diagnosis not present

## 2020-09-19 DIAGNOSIS — R58 Hemorrhage, not elsewhere classified: Secondary | ICD-10-CM

## 2020-09-19 LAB — CBC WITH DIFFERENTIAL/PLATELET
Basophils Absolute: 0 10*3/uL (ref 0.0–0.1)
Basophils Relative: 0.7 % (ref 0.0–3.0)
Eosinophils Absolute: 0.2 10*3/uL (ref 0.0–0.7)
Eosinophils Relative: 4.7 % (ref 0.0–5.0)
HCT: 39.3 % (ref 36.0–46.0)
Hemoglobin: 13.2 g/dL (ref 12.0–15.0)
Lymphocytes Relative: 33 % (ref 12.0–46.0)
Lymphs Abs: 1.1 10*3/uL (ref 0.7–4.0)
MCHC: 33.6 g/dL (ref 30.0–36.0)
MCV: 86.3 fl (ref 78.0–100.0)
Monocytes Absolute: 0.5 10*3/uL (ref 0.1–1.0)
Monocytes Relative: 15.3 % — ABNORMAL HIGH (ref 3.0–12.0)
Neutro Abs: 1.6 10*3/uL (ref 1.4–7.7)
Neutrophils Relative %: 46.3 % (ref 43.0–77.0)
Platelets: 250 10*3/uL (ref 150.0–400.0)
RBC: 4.55 Mil/uL (ref 3.87–5.11)
RDW: 13 % (ref 11.5–15.5)
WBC: 3.4 10*3/uL — ABNORMAL LOW (ref 4.0–10.5)

## 2020-09-19 LAB — PROTIME-INR
INR: 1.1 ratio — ABNORMAL HIGH (ref 0.8–1.0)
Prothrombin Time: 12 s (ref 9.6–13.1)

## 2020-09-19 LAB — COMPREHENSIVE METABOLIC PANEL
ALT: 18 U/L (ref 0–35)
AST: 26 U/L (ref 0–37)
Albumin: 4.1 g/dL (ref 3.5–5.2)
Alkaline Phosphatase: 40 U/L (ref 39–117)
BUN: 7 mg/dL (ref 6–23)
CO2: 29 mEq/L (ref 19–32)
Calcium: 9.5 mg/dL (ref 8.4–10.5)
Chloride: 104 mEq/L (ref 96–112)
Creatinine, Ser: 0.7 mg/dL (ref 0.40–1.20)
GFR: 120.45 mL/min (ref >60.00)
Glucose, Bld: 75 mg/dL (ref 70–99)
Potassium: 4.3 mEq/L (ref 3.5–5.1)
Sodium: 138 mEq/L (ref 135–145)
Total Bilirubin: 0.2 mg/dL (ref 0.2–1.2)
Total Protein: 6.8 g/dL (ref 6.0–8.3)

## 2020-09-19 LAB — TSH: TSH: 1.58 u[IU]/mL (ref 0.35–4.50)

## 2020-09-19 LAB — T4, FREE: Free T4: 0.81 ng/dL (ref 0.60–1.60)

## 2020-09-19 NOTE — Patient Instructions (Signed)
Hip Bursitis  Hip bursitis is the inflammation of one or more bursae in the hip joint. Bursae are small fluid-filled sacs that absorb shock and prevent bones from rubbing against each other. Hip bursitis can cause mild to moderate pain, and symptoms often come and go over time. What are the causes? This condition results from increased friction between the hip bones and the tendons around the hip joint. This condition can happen if you:  Overuse your hip muscles.  Injure your hip.  Have weak buttocks muscles.  Have bone spurs.  Have an infection. In some cases, the cause may not be known. What increases the risk? You are more likely to develop this condition if:  You injured your hip previously or had hip surgery.  You have a medical condition, such as arthritis, gout, diabetes, or thyroid disease.  You have spine problems.  You have one leg that is shorter than the other.  You participate in athletic activities that include repetitive motion, like running.  You participate in sports where there is a risk of injury or falling, such as football, martial arts, or skiing. What are the signs or symptoms? Symptoms may come and go, and they often include:  Pain in the hip or groin area. Pain may get worse with movement.  Tenderness and swelling of the hip. In rare cases, the bursa may become infected. If this happens, you may get a fever, as well as warmth and redness in the hip area. How is this diagnosed? This condition may be diagnosed based on:  Your symptoms.  Your medical history.  A physical exam.  Imaging tests, such as: ? X-rays to check your bones. ? MRI or ultrasound to check your tendons and muscles. ? Bone scan.  A biopsy to remove fluid from your inflamed bursa for testing. How is this treated? This condition is treated by resting, icing, applying pressure (compression), and raising (elevating) the injured area. This is called RICE treatment. In some  cases, RICE treatment may not be enough to make your symptoms go away. Treatment may also include:  Taking medicine to help with swelling and pain.  Using crutches, a cane, or a walker to decrease the strain on your hip.  Getting a shot of cortisone medicine to help reduce swelling.  Taking other medicines if the bursa is infected.  Draining fluid out of the bursa to help relieve swelling.  Having surgery to remove a damaged or infected bursa. This is rare. Long-term treatment may include:  Physical therapy exercises for strength and flexibility.  Lifestyle changes, such as weight loss, to reduce the strain on the hip. Follow these instructions at home: Managing pain, stiffness, and swelling  If directed, put ice on the painful area. ? Put ice in a plastic bag. ? Place a towel between your skin and the bag. ? Leave the ice on for 20 minutes, 2-3 times a day.  Raise (elevate) your hip as much as you can without pain. To do this, put a pillow under your hips while you lie down.  If directed, apply heat to the affected area as often as told by your health care provider. Use the heat source that your health care provider recommends, such as a moist heat pack or a heating pad. ? Place a towel between your skin and the heat source. ? Leave the heat on for 20-30 minutes. ? Remove the heat if your skin turns bright red. This is especially important if you are unable   to feel pain, heat, or cold. You may have a greater risk of getting burned.      Activity  Do not use your hip to support your body weight until your health care provider says that you can. Use crutches, a cane, or a walker as told by your health care provider.  If the affected leg is one that you use to drive, ask your health care provider if it is safe to drive.  Rest and protect your hip as much as possible until your pain and swelling get better.  Return to your normal activities as told by your health care provider.  Ask your health care provider what activities are safe for you.  Do exercises as told by your health care provider. General instructions  Take over-the-counter and prescription medicines only as told by your health care provider.  Gently massage and stretch your injured area as often as is comfortable.  Wear compression wraps only as told by your health care provider.  If one of your legs is shorter than the other, get fitted for a shoe insert or orthotic.  Maintain a healthy weight. Follow instructions from your health care provider for weight control. These may include dietary restrictions.  Keep all follow-up visits as told by your health care provider. This is important. How is this prevented?  Exercise regularly, as told by your health care provider.  Wear supportive footwear that is appropriate for your sport.  Warm up and stretch before being active. Cool down and stretch after being active.  Take breaks regularly from repetitive activity.  If an activity irritates your hip or causes pain, avoid the activity as much as possible.  Avoid sitting down for long periods at a time. Where to find more information  [employer]: orthoinfo.aaos.org Contact a health care provider if:  You have a fever.  You develop new symptoms.  You have trouble walking or doing everyday activities.  You have pain that gets worse or does not get better with medicine.  You develop red skin or a feeling of warmth in your hip area. Get help right away if:  You cannot move your hip.  You have severe pain.  You cannot control the muscles in your feet. Summary  Hip bursitis is the inflammation of one or more bursae in the hip joint. Bursae are small fluid-filled sacs that absorb shock and prevent bones from rubbing against each other.  Hip bursitis can cause hip or groin pain, and symptoms often come and go over time.  This condition is often treated by  resting, icing, applying pressure (compression), and raising (elevating) the injured area. Other treatments may be needed. This information is not intended to replace advice given to you by your health care provider. Make sure you discuss any questions you have with your health care provider. Document Revised: 04/06/2019 Document Reviewed: 02/10/2018 Elsevier Patient Education  2021 Elsevier Inc.   Hip Bursitis Rehab Ask your health care provider which exercises are safe for you. Do exercises exactly as told by your health care provider and adjust them as directed. It is normal to feel mild stretching, pulling, tightness, or discomfort as you do these exercises. Stop right away if you feel sudden pain or your pain gets worse. Do not begin these exercises until told by your health care provider. Stretching exercise This exercise warms up your muscles and joints and improves the movement and flexibility of your hip. This exercise also helps to relieve   and stiffness. Iliotibial band stretch An iliotibial band is a strong band of muscle tissue that runs from the outer side of your hip to the outer side of your thigh and knee. 1. Lie on your side with your left / right leg in the top position. 2. Bend your left / right knee and grab your ankle. Stretch out your bottom arm to help you balance. 3. Slowly bring your knee back so your thigh is behind your body. 4. Slowly lower your knee toward the floor until you feel a gentle stretch on the outside of your left / right thigh. If you do not feel a stretch and your knee will not fall farther, place the heel of your other foot on top of your knee and pull your knee down toward the floor with your foot. 5. Hold this position for __________ seconds. 6. Slowly return to the starting position. Repeat __________ times. Complete this exercise __________ times a day.   Strengthening exercises These exercises build strength and endurance in your hip and pelvis.  Endurance is the ability to use your muscles for a long time, even after they get tired. Bridge This exercise strengthens the muscles that move your thigh backward (hip extensors). 1. Lie on your back on a firm surface with your knees bent and your feet flat on the floor. 2. Tighten your buttocks muscles and lift your buttocks off the floor until your trunk is level with your thighs. ? Do not arch your back. ? You should feel the muscles working in your buttocks and the back of your thighs. If you do not feel these muscles, slide your feet 1-2 inches (2.5-5 cm) farther away from your buttocks. ? If this exercise is too easy, try doing it with your arms crossed over your chest. 3. Hold this position for __________ seconds. 4. Slowly lower your hips to the starting position. 5. Let your muscles relax completely after each repetition. Repeat __________ times. Complete this exercise __________ times a day.   Squats This exercise strengthens the muscles in front of your thigh and knee (quadriceps). 1. Stand in front of a table, with your feet and knees pointing straight ahead. You may rest your hands on the table for balance but not for support. 2. Slowly bend your knees and lower your hips like you are going to sit in a chair. ? Keep your weight over your heels, not over your toes. ? Keep your lower legs upright so they are parallel with the table legs. ? Do not let your hips go lower than your knees. ? Do not bend lower than told by your health care provider. ? If your hip pain increases, do not bend as low. 3. Hold the squat position for __________ seconds. 4. Slowly push with your legs to return to standing. Do not use your hands to pull yourself to standing. Repeat __________ times. Complete this exercise __________ times a day. Hip hike 1. Stand sideways on a bottom step. Stand on your left / right leg with your other foot unsupported next to the step. You can hold on to the railing or wall  for balance if needed. 2. Keep your knees straight and your torso square. Then lift your left / right hip up toward the ceiling. 3. Hold this position for __________ seconds. 4. Slowly let your left / right hip lower toward the floor, past the starting position. Your foot should get closer to the floor. Do not lean or bend your  your knees. Repeat __________ times. Complete this exercise __________ times a day. Single leg stand 1. Without shoes, stand near a railing or in a doorway. You may hold on to the railing or door frame as needed for balance. 2. Squeeze your left / right buttock muscles, then lift up your other foot. ? Do not let your left / right hip push out to the side. ? It is helpful to stand in front of a mirror for this exercise so you can watch your hip. 3. Hold this position for __________ seconds. Repeat __________ times. Complete this exercise __________ times a day. This information is not intended to replace advice given to you by your health care provider. Make sure you discuss any questions you have with your health care provider. Document Revised: 09/29/2018 Document Reviewed: 09/29/2018 Elsevier Patient Education  2021 Elsevier Inc.   

## 2020-09-19 NOTE — Progress Notes (Signed)
Subjective:    Patient ID: Maria Smith, female    DOB: Aug 13, 1995, 25 y.o.   MRN: [id number]  Chief Complaint  Patient presents with  . bruising on legs    HPI Patient was seen today for acute concern of bruising on legs and intermittent pain in L hip and leg x 1 wk.  Pt does not recall any change in activity or meds.  Pt with posterior thigh pain.  Ecchymosis on anterior thigh.  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  Takes breaks to go to the restroom.  Chair has lumbar support.  Past Medical History:  Diagnosis Date  . Allergic rhinitis   . Allergy    SEASONAL  . Asthma   . Eczema   . Environmental allergies    Age 81 years  . Fatty liver 12/11/2018   Noted on CT 6/20  . Generalized headaches    Began at age 23 years  . GERD (gastroesophageal reflux disease)   . Influenza    Age 89 years  . 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 Streptococcal infection(041.00)    Age 79 and 25 years old  . TB lung, latent    2017  . 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/2018   Diffuse enlargement on [place] 6/20    No Known Allergies  ROS General: Denies fever, chills, night sweats, changes in weight, changes in appetite HEENT: Denies headaches, ear pain, changes in vision, rhinorrhea, sore throat CV: Denies CP, palpitations, SOB, orthopnea Pulm: Denies SOB, cough, wheezing GI: Denies abdominal pain, nausea, vomiting, diarrhea, constipation GU: Denies dysuria, hematuria, frequency, vaginal discharge Msk: Denies muscle cramps, joint pains  +L leg/hip pain Neuro: Denies weakness, numbness, tingling Skin: Denies rashes +bruising Psych: Denies depression, anxiety, hallucinations      Objective:    Blood pressure 124/70, pulse 88, height 4' 8.5" (1.435 m), weight 109 lb (49.4 kg), SpO2 90 %.  Gen. Pleasant, well-nourished, in no distress, normal affect   HEENT: [place]/AT, face symmetric, conjunctiva clear, no scleral icterus, PERRLA, EOMI, nares patent  without drainage Lungs: no accessory muscle use Cardiovascular: RRR,  no peripheral edema Musculoskeletal: TTP if lumbar paraspinal muscles.  No TTP of L sciatic nerve, cervical, thoracic, or lumbar spine.  Negative log roll b/l.  Positive straight leg raise on L at 25 degrees.  + FADIR and FABER.  TTP of L lateral hip and groin.  No deformities, no cyanosis or clubbing, normal tone Neuro:  A&Ox3, CN II-XII intact, normal gait Skin:  Warm, no lesions/ rash  Faint ecchymosis on medial L thigh distally.    Wt Readings from Last 3 Encounters:  09/19/20 109 lb (49.4 kg)  08/08/20 106 lb 7.7 oz (48.3 kg)  06/08/20 106 lb 8 oz (48.3 kg)    Lab Results  Component Value Date   WBC 6.9 04/16/2020   HGB 12.9 04/16/2020   HCT 39.0 04/16/2020   PLT 298 04/16/2020   GLUCOSE 88 04/16/2020   CHOL 167 06/26/2017   TRIG 32 06/26/2017   HDL 94 06/26/2017   LDLCALC 67 06/26/2017   ALT 20 04/16/2020   AST 27 04/16/2020   NA 137 04/16/2020   K 3.9 04/16/2020   CL 104 04/16/2020   CREATININE 0.70 04/16/2020   BUN 9 04/16/2020   CO2 27 04/16/2020   TSH 1.23 02/05/2020    Assessment/Plan:  Trochanteric bursitis of left hip  -discussed supportive care including NSAIDs,  ice, heat, stretching. -can also use topical analgesics such as Aspercreme. -given handout -consider prednisone for continued or worsened symptoms - Plan: CBC with Differential/Platelet  Ecchymosis  - Plan: CBC with Differential/Platelet, CMP, TSH, T4, Free, Protime-INR  F/u prn  Abbe Amsterdam, MD

## 2020-09-24 ENCOUNTER — Ambulatory Visit: Payer: No Typology Code available for payment source | Attending: Internal Medicine

## 2020-09-24 ENCOUNTER — Other Ambulatory Visit: Payer: Self-pay

## 2020-09-24 DIAGNOSIS — R293 Abnormal posture: Secondary | ICD-10-CM

## 2020-09-24 DIAGNOSIS — M546 Pain in thoracic spine: Secondary | ICD-10-CM | POA: Diagnosis not present

## 2020-09-24 DIAGNOSIS — R29898 Other symptoms and signs involving the musculoskeletal system: Secondary | ICD-10-CM

## 2020-09-24 DIAGNOSIS — M7062 Trochanteric bursitis, left hip: Secondary | ICD-10-CM | POA: Insufficient documentation

## 2020-09-24 DIAGNOSIS — M6281 Muscle weakness (generalized): Secondary | ICD-10-CM | POA: Diagnosis present

## 2020-09-24 NOTE — Therapy (Signed)
[facility] [address] [place], [place], [address] Phone: [phone]   Fax:  [phone]  Physical Therapy Evaluation  Patient Details  Name: Maria Smith MRN: [id number] Date of Birth: 1996/05/31 Referring Provider (PT): Sheliah Hatch MD   Encounter Date: 09/24/2020   PT End of Session - 09/24/20 0958    Visit Number 1    Number of Visits 12    Date for PT Re-Evaluation 11/05/20    Authorization Type [facility] Focus plan    PT Start Time 0945    PT Stop Time 1030    PT Time Calculation (min) 45 min    Activity Tolerance Patient limited by pain    Behavior During Therapy [facility] for tasks assessed/performed           Past Medical History:  Diagnosis Date  . Allergic rhinitis   . Allergy    SEASONAL  . Asthma   . Eczema   . Environmental allergies    Age 46 years  . Fatty liver 12/11/2018   Noted on CT 6/20  . Generalized headaches    Began at age 42 years  . GERD (gastroesophageal reflux disease)   . Influenza    Age 73 years  . 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 Streptococcal infection(041.00)    Age 40 and 25 years old  . TB lung, latent    2017  . 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 TONSILLECTOMY  2012  . WISDOM TOOTH EXTRACTION      There were no vitals filed for this visit.    Subjective Assessment - 09/24/20 0951    Subjective She reports Lt chest wall and rib pain. Chostrochondritis.  Saw PT in past but scheduling was an issue.    She is not doing her HEP from this time.    Limitations Lifting   reaching  , child care   Diagnostic tests Xrays WNL    Patient Stated Goals She want to find a way to  decrease pain.    Currently in Pain? Yes    Pain Score 6     Pain Location Chest    Pain Orientation Left;Anterior;Posterior    Pain Descriptors / Indicators Sore;Aching;Sharp    Pain Type Chronic  pain    Pain Onset More than a month ago    Pain Frequency Constant    Aggravating Factors  reaching, lifting, pressure to chest.    Pain Relieving Factors heat, aleve              [facility] PT Assessment - 09/24/20 0001      Assessment   Medical Diagnosis chest painLLT    Referring Provider (PT) Sheliah Hatch MD    Onset Date/Surgical Date --   2 years worse a year ago   Next MD Visit As needed    Prior Therapy 2 visits PT      Precautions   Precautions None      Restrictions   Weight Bearing Restrictions No      Balance Screen   Has the patient fallen in the past 6 months No      Prior Function   Level of Independence Independent    Vocation Full time employment    Tax adviser duties      Cognition   Overall Cognitive Status Within Functional Limits for tasks  assessed      Posture/Postural Control   Posture Comments RT shoulder lower than LT      ROM / Strength   AROM / PROM / Strength AROM;Strength      Strength   Overall Strength Comments UE WNL      Flexibility   Soft Tissue Assessment /Muscle Length yes    Hamstrings approx 60 degrees bilateral with LT hip pain and more difficulty lifting Lt      Palpation   SI assessment  LT ASIS higerh in supine and leg pull eases pressure to hip and moves more than RT    Palpation comment Tender LT flank and hips                      Objective measurements completed on examination: See above findings.               PT Education - 09/24/20 0956    Education Details POC ,    FOTO score    Person(s) Educated Patient    Methods Explanation    Comprehension Verbalized understanding            PT Short Term Goals - 09/24/20 1002      PT SHORT TERM GOAL #1   Title Independent with initial HEP    Time 3    Period Weeks    Status New      PT SHORT TERM GOAL #2   Title She will report 30% decr pain and improved activity at work and home    Time 6    Period Weeks     Status New             PT Long Term Goals - 09/24/20 1001      PT LONG TERM GOAL #1   Title independent with HEP    Time 6    Period Weeks    Status New      PT LONG TERM GOAL #2   Title perform thoracolumbar ROM without increase in pain for improved function    Time 6    Period Weeks    Status New      PT LONG TERM GOAL #3   Title FOTO score improved to 67 for improved function    Time 6    Period Weeks    Status New      PT LONG TERM GOAL #4   Title demonstrate proper lifting techniques to reduce risk of reinjury    Time 6    Period Weeks    Status New      PT LONG TERM GOAL #5   Title report 50% improvement in caring for twin babies for improved function    Time 6    Period Weeks    Status New                  Plan - 09/24/20 1053    Clinical Impression Statement Ms Maria Smith reports continued pain in LT cest wall and shoulder. Her  UE ROM Is WNL and strength normal but with pain.    Personal Factors and Comorbidities Past/Current Experience;Time since onset of injury/illness/exacerbation;Comorbidity 1    Comorbidities posture    Examination-Activity Limitations Reach Overhead;Caring for Others;Carry;Lift    Examination-Participation Restrictions Cleaning;Occupation;Community Activity;Laundry    Stability/Clinical Decision Making Evolving/Moderate complexity    Clinical Decision Making Moderate    Rehab Potential Good    PT Frequency 2x / week  PT Duration 6 weeks    PT Treatment/Interventions Patient/family education;Therapeutic exercise;Therapeutic activities;Iontophoresis 4mg /ml Dexamethasone;Moist Heat;Passive range of motion;Dry needling;Manual techniques    PT Next Visit Plan Progress HE. P, Assess SI               Work on posture.                  Manual and modalities as needed.    PT Home Exercise Plan Stretching exercises per initial episode of care  [id number]    Consulted and Agree with Plan of Care Patient           Patient will  benefit from skilled therapeutic intervention in order to improve the following deficits and impairments:  Postural dysfunction,Decreased activity tolerance,Pain,Increased muscle spasms,Impaired UE functional use  Visit Diagnosis: Pain in thoracic spine  Other symptoms and signs involving the musculoskeletal system  Abnormal posture     Problem List Patient Active Problem List   Diagnosis Date Noted  . Left-sided chest wall pain 03/23/2020  . Positive ANA (antinuclear antibody) 03/23/2020  . Asthma 02/10/2020  . Allergic rhinitis 02/10/2020  . GERD (gastroesophageal reflux disease) 02/10/2020  . History of TB (tuberculosis) 08/21/2019  . Throat pain 01/28/2019  . Odynophagia 01/28/2019  . Neck pain 01/28/2019  . Thyroiditis 01/12/2019  . 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/2018  . Fatty liver 12/11/2018  . PCOS (polycystic ovarian syndrome) 11/11/2018  . Seasonal allergies 09/22/2018  . 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 Migraine with aura and without status migrainosus, not intractable 09/11/2012  . Variants of migraine, not elsewhere classified, without mention of intractable migraine without mention of status migrainosus 09/11/2012  . Unspecified constipation 09/11/2012  . Syncope 08/31/2010  . Neck Pain 08/31/2010  . Insomnia 08/31/2010    09/02/2010 PT 09/24/2020, 12:29 PM  [facility] [address] [place], [place], [place] Phone: [phone]   Fax:  [phone]  Name: Maria Smith MRN: Valerie Roys Date of Birth: [birth year]

## 2020-09-26 NOTE — Progress Notes (Signed)
Results viewed on MyChart. 

## 2020-10-15 ENCOUNTER — Ambulatory Visit: Payer: No Typology Code available for payment source

## 2020-10-15 ENCOUNTER — Other Ambulatory Visit: Payer: Self-pay

## 2020-10-15 DIAGNOSIS — R293 Abnormal posture: Secondary | ICD-10-CM

## 2020-10-15 DIAGNOSIS — M546 Pain in thoracic spine: Secondary | ICD-10-CM | POA: Diagnosis not present

## 2020-10-15 DIAGNOSIS — R29898 Other symptoms and signs involving the musculoskeletal system: Secondary | ICD-10-CM

## 2020-10-15 DIAGNOSIS — M7062 Trochanteric bursitis, left hip: Secondary | ICD-10-CM

## 2020-10-15 DIAGNOSIS — M6281 Muscle weakness (generalized): Secondary | ICD-10-CM

## 2020-10-16 NOTE — Therapy (Signed)
[facility] Outpatient Rehabilitation [facility] [address] [place], [place], [address] Phone: [phone]   Fax:  [phone]  Physical Therapy Treatment  Patient Details  Name: Maria Smith MRN: [id number] Date of Birth: [birth year] Referring Provider (PT): Sheliah Hatch MD   Encounter Date: 10/15/2020   PT End of Session - 10/15/20 0820    Visit Number 2    Number of Visits 12    Date for PT Re-Evaluation 11/05/20    Authorization Type [facility] Focus plan    PT Start Time 0817    PT Stop Time 0900    PT Time Calculation (min) 43 min    Activity Tolerance Patient tolerated treatment well    Behavior During Therapy [facility] for tasks assessed/performed           Past Medical History:  Diagnosis Date  . Allergic rhinitis   . Allergy    SEASONAL  . Asthma   . Eczema   . Environmental allergies    Age 14 years  . Fatty liver 12/11/2018   Noted on CT 6/20  . Generalized headaches    Began at age 34 years  . GERD (gastroesophageal reflux disease)   . Influenza    Age 62 years  . 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 Streptococcal infection(041.00)    Age 62 and 25 years old  . TB lung, latent    2017  . 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 TONSILLECTOMY  2012  . WISDOM TOOTH EXTRACTION      There were no vitals filed for this visit.   Subjective Assessment - 10/15/20 0824    Subjective The L hip is bothering more than her L ribs at this time. My whole L side is giving me issues.    Limitations Lifting    Diagnostic tests Xrays WNL    Patient Stated Goals She want to find a way to  decrease pain.    Currently in Pain? Yes    Pain Score 4     Pain Location Rib cage    Pain Orientation Left;Lateral    Pain Descriptors / Indicators Aching;Sore;Sharp    Pain Type Chronic pain    Pain Onset More than a month ago    Pain Frequency Constant     Aggravating Factors  reaching, lifting, pressure to chest.    Pain Relieving Factors heat, aleve    Multiple Pain Sites Yes    Pain Score 8    Pain Location Hip    Pain Orientation Left;Lateral    Pain Descriptors / Indicators Aching;Sore    Pain Type Chronic pain    Pain Onset More than a month ago    Pain Frequency Constant    Aggravating Factors  caring for my 62 mth old twins              [facility] PT Assessment - 10/15/20 0001      ROM / Strength   AROM / PROM / Strength PROM;Strength      PROM   Overall PROM Comments Pain was reproduced c IR most significantly, but all motions generated L hip pain      Strength   Overall Strength Comments Pain was reproduced most significanty c resisted hip ER, but all motions generated L hip pain  [facility] Adult PT Treatment/Exercise - 10/15/20 0001      Exercises   Exercises Knee/Hip      Knee/Hip Exercises: Stretches   ITB Stretch Left;3 reps;30 seconds    Piriformis Stretch Left;3 reps;30 seconds      Knee/Hip Exercises: Supine   Bridges Both;2 sets;10 reps      Modalities   Modalities Iontophoresis      Iontophoresis   Type of Iontophoresis Dexamethasone    Location L lateral hip    Dose 4 mg/ml; 1 ml    Time 6 hours      Manual Therapy   Manual Therapy Soft tissue mobilization    Soft tissue mobilization Attempted STM/Cross friction massage to the L lateral mid ribs. Light massage was significantly irritating and was discontinued.                  PT Education - 10/16/20 0002    Education Details Updated HEP    Person(s) Educated Patient    Methods Explanation;Demonstration;Tactile cues;Verbal cues;Handout    Comprehension Verbalized understanding;Returned demonstration;Verbal cues required;Tactile cues required            PT Short Term Goals - 09/24/20 1002      PT SHORT TERM GOAL #1   Title Independent with initial HEP    Time 3    Period Weeks    Status New       PT SHORT TERM GOAL #2   Title She will report 30% decr pain and improved activity at work and home    Time 6    Period Weeks    Status New             PT Long Term Goals - 09/24/20 1001      PT LONG TERM GOAL #1   Title independent with HEP    Time 6    Period Weeks    Status New      PT LONG TERM GOAL #2   Title perform thoracolumbar ROM without increase in pain for improved function    Time 6    Period Weeks    Status New      PT LONG TERM GOAL #3   Title FOTO score improved to 67 for improved function    Time 6    Period Weeks    Status New      PT LONG TERM GOAL #4   Title demonstrate proper lifting techniques to reduce risk of reinjury    Time 6    Period Weeks    Status New      PT LONG TERM GOAL #5   Title report 50% improvement in caring for twin babies for improved function    Time 6    Period Weeks    Status New                 Plan - 10/15/20 3818    Clinical Impression Statement Pt appears to be symptomatic of L trochanteric bursitis and her care was directed to this area. L hip flexibility and strengthening exs were completed and were added to the pt's HEP. Additionally, iontophoresis was applied to the L lateral hip in the greater trochanteric area. Attempted STM/cross friction massage to the L lateral mid ribs, but pt was not able to tolerate very light pressure. Pt tolerated today's pt session without adverse effects    Personal Factors and Comorbidities Past/Current Experience;Time since onset of injury/illness/exacerbation;Comorbidity 1    Comorbidities posture  Examination-Activity Limitations Reach Overhead;Caring for Others;Carry;Lift    Examination-Participation Restrictions Cleaning;Occupation;Community Activity;Laundry    Stability/Clinical Decision Making Evolving/Moderate complexity    Clinical Decision Making Moderate    Rehab Potential Good    PT Frequency 2x / week    PT Duration 6 weeks    PT Treatment/Interventions  Patient/family education;Therapeutic exercise;Therapeutic activities;Iontophoresis 4mg /ml Dexamethasone;Moist Heat;Passive range of motion;Dry needling;Manual techniques    PT Next Visit Plan Progress HE. P, Assess SI               Work on posture.                  Manual and modalities as needed.    PT Home Exercise Plan Stretching exercises per initial episode of care  [id number]    Consulted and Agree with Plan of Care Patient           Patient will benefit from skilled therapeutic intervention in order to improve the following deficits and impairments:  Postural dysfunction,Decreased activity tolerance,Pain,Increased muscle spasms,Impaired UE functional use  Visit Diagnosis: Pain in thoracic spine  Abnormal posture  Other symptoms and signs involving the musculoskeletal system  Muscle weakness (generalized)  Greater trochanteric bursitis, left     Problem List Patient Active Problem List   Diagnosis Date Noted  . Left-sided chest wall pain 03/23/2020  . Positive ANA (antinuclear antibody) 03/23/2020  . Asthma 02/10/2020  . Allergic rhinitis 02/10/2020  . GERD (gastroesophageal reflux disease) 02/10/2020  . History of TB (tuberculosis) 08/21/2019  . Throat pain 01/28/2019  . Odynophagia 01/28/2019  . Neck pain 01/28/2019  . Thyroiditis 01/12/2019  . 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/2018  . Fatty liver 12/11/2018  . PCOS (polycystic ovarian syndrome) 11/11/2018  . Seasonal allergies 09/22/2018  . 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 Migraine with aura and without status migrainosus, not intractable 09/11/2012  . Variants of migraine, not elsewhere classified, without mention of intractable migraine without mention of status migrainosus 09/11/2012  . Unspecified constipation 09/11/2012  . Syncope 08/31/2010  . Neck Pain 08/31/2010  . Insomnia 08/31/2010    09/02/2010 MS, PT 10/16/20 12:14 AM  [facility] [address] [place],  [place], [place] Phone: [phone]   Fax:  [phone]  Name: Maria Smith MRN: Valerie Roys Date of Birth: Jan 02, 1996

## 2020-10-20 ENCOUNTER — Other Ambulatory Visit: Payer: Self-pay

## 2020-10-20 ENCOUNTER — Ambulatory Visit: Payer: No Typology Code available for payment source | Attending: Internal Medicine

## 2020-10-20 DIAGNOSIS — R293 Abnormal posture: Secondary | ICD-10-CM | POA: Diagnosis present

## 2020-10-20 DIAGNOSIS — R198 Other specified symptoms and signs involving the digestive system and abdomen: Secondary | ICD-10-CM | POA: Insufficient documentation

## 2020-10-20 DIAGNOSIS — M7062 Trochanteric bursitis, left hip: Secondary | ICD-10-CM | POA: Diagnosis present

## 2020-10-20 DIAGNOSIS — R1310 Dysphagia, unspecified: Secondary | ICD-10-CM | POA: Diagnosis present

## 2020-10-20 DIAGNOSIS — R29898 Other symptoms and signs involving the musculoskeletal system: Secondary | ICD-10-CM | POA: Diagnosis present

## 2020-10-20 DIAGNOSIS — M6281 Muscle weakness (generalized): Secondary | ICD-10-CM | POA: Insufficient documentation

## 2020-10-20 DIAGNOSIS — R070 Pain in throat: Secondary | ICD-10-CM | POA: Diagnosis present

## 2020-10-20 DIAGNOSIS — M546 Pain in thoracic spine: Secondary | ICD-10-CM | POA: Diagnosis not present

## 2020-10-20 DIAGNOSIS — K219 Gastro-esophageal reflux disease without esophagitis: Secondary | ICD-10-CM | POA: Diagnosis present

## 2020-10-20 NOTE — Therapy (Signed)
[facility] Outpatient Rehabilitation [facility] [address] [place], [place], [address] Phone: [phone]   Fax:  [phone]  Physical Therapy Treatment  Patient Details  Name: Maria Smith MRN: [id number] Date of Birth: 1996-02-20 Referring Provider (PT): Sheliah Hatch MD   Encounter Date: 10/20/2020   PT End of Session - 10/20/20 2108    Visit Number 3    Number of Visits 12    Date for PT Re-Evaluation 11/05/20    Authorization Type [facility] Focus plan    PT Start Time 1750    PT Stop Time 1840    PT Time Calculation (min) 50 min    Activity Tolerance Patient tolerated treatment well    Behavior During Therapy [facility] for tasks assessed/performed           Past Medical History:  Diagnosis Date  . Allergic rhinitis   . Allergy    SEASONAL  . Asthma   . Eczema   . Environmental allergies    Age 25 years  . Fatty liver 12/11/2018   Noted on CT 6/20  . Generalized headaches    Began at age 25 years  . GERD (gastroesophageal reflux disease)   . Influenza    Age 25 years  . 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 Streptococcal infection(041.00)    Age 25 and 25 years old  . TB lung, latent    2017  . 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 TONSILLECTOMY  2012  . WISDOM TOOTH EXTRACTION      There were no vitals filed for this visit.   Subjective Assessment - 10/20/20 1755    Subjective Pt reports her L hip is feeling better and the new doorway stretch for the l hip and hip has been helpful. L lateral rib and costochrodral pain is less sharp.    Limitations Lifting    Diagnostic tests Xrays WNL    Patient Stated Goals She want to find a way to  decrease pain.    Currently in Pain? Yes    Pain Score 7     Pain Location Rib cage    Pain Orientation Left    Pain Descriptors / Indicators Aching    Pain Type Chronic pain    Pain Onset More than a  month ago    Pain Frequency Intermittent    Aggravating Factors  reaching, lifting, pressure to chest    Pain Relieving Factors heat, aleve    Pain Score 5    Pain Location Hip    Pain Orientation Left    Pain Descriptors / Indicators Aching    Pain Onset More than a month ago    Pain Frequency Constant    Aggravating Factors  caring for my 81 mth old twins, sitting                             [facility] Adult PT Treatment/Exercise - 10/20/20 0001      Knee/Hip Exercises: Stretches   ITB Stretch Right;Left;2 reps;30 seconds    Piriformis Stretch Left;30 seconds      Knee/Hip Exercises: Supine   Bridges Both;2 sets;10 reps    Other Supine Knee/Hip Exercises Clams; 15x; green Tband      Knee/Hip Exercises: Sidelying   Hip ABduction Left;5 reps      Iontophoresis  Type of Iontophoresis Dexamethasone    Location L lateral hip    Dose 4 mg/ml; 1 ml    Time 6 hours      Manual Therapy   Manual Therapy Joint mobilization    Soft tissue mobilization UPAs L and R T1-T8. Instrument assisted thoracic extension ROM/mobilization c soft foam roller while pt sitting is a chair. Roller was stabilized by PT.                  PT Education - 10/20/20 2108    Education Details Updated HEP. Decrease ROM the shoulder ER Tband ex and pectoral stretch    Person(s) Educated Patient    Methods Explanation;Demonstration;Tactile cues;Verbal cues;Handout    Comprehension Verbalized understanding;Returned demonstration;Verbal cues required;Tactile cues required            PT Short Term Goals - 09/24/20 1002      PT SHORT TERM GOAL #1   Title Independent with initial HEP    Time 3    Period Weeks    Status New      PT SHORT TERM GOAL #2   Title She will report 30% decr pain and improved activity at work and home    Time 6    Period Weeks    Status New             PT Long Term Goals - 09/24/20 1001      PT LONG TERM GOAL #1   Title independent with HEP     Time 6    Period Weeks    Status New      PT LONG TERM GOAL #2   Title perform thoracolumbar ROM without increase in pain for improved function    Time 6    Period Weeks    Status New      PT LONG TERM GOAL #3   Title FOTO score improved to 67 for improved function    Time 6    Period Weeks    Status New      PT LONG TERM GOAL #4   Title demonstrate proper lifting techniques to reduce risk of reinjury    Time 6    Period Weeks    Status New      PT LONG TERM GOAL #5   Title report 50% improvement in caring for twin babies for improved function    Time 6    Period Weeks    Status New                 Plan - 10/20/20 2109    Clinical Impression Statement Manual PT was provided to the thoarcic vertebrae to address mobility and L rib cage and costochondral pain. Reviewed HEP and made recommendations to limited ROM for shoulder ER Tband ex and pectoral stretch. Flexibility and strengthening exs were completed for L greater trochanteric bursitis. L hip abd was found to be significantly weak. Iontophoresis, which was provided the last session and was helpful with reducing the L hip pain, was provided at the end of today's session.    Personal Factors and Comorbidities Past/Current Experience;Time since onset of injury/illness/exacerbation;Comorbidity 1    Comorbidities posture    Examination-Activity Limitations Reach Overhead;Caring for Others;Carry;Lift    Examination-Participation Restrictions Cleaning;Occupation;Community Activity;Laundry    Stability/Clinical Decision Making Evolving/Moderate complexity    Clinical Decision Making Moderate    Rehab Potential Good    PT Frequency 2x / week    PT Duration 6 weeks  PT Treatment/Interventions Patient/family education;Therapeutic exercise;Therapeutic activities;Iontophoresis 4mg /ml Dexamethasone;Moist Heat;Passive range of motion;Dry needling;Manual techniques    PT Next Visit Plan Progress HEP,  Work on posture, Manual and  modalities as needed.    PT Home Exercise Plan Stretching exercises per initial episode of care  [id number]    Consulted and Agree with Plan of Care Patient           Patient will benefit from skilled therapeutic intervention in order to improve the following deficits and impairments:  Postural dysfunction,Decreased activity tolerance,Pain,Increased muscle spasms,Impaired UE functional use  Visit Diagnosis: Pain in thoracic spine  Abnormal posture  Other symptoms and signs involving the musculoskeletal system  Muscle weakness (generalized)  Greater trochanteric bursitis, left     Problem List Patient Active Problem List   Diagnosis Date Noted  . Left-sided chest wall pain 03/23/2020  . Positive ANA (antinuclear antibody) 03/23/2020  . Asthma 02/10/2020  . Allergic rhinitis 02/10/2020  . GERD (gastroesophageal reflux disease) 02/10/2020  . History of TB (tuberculosis) 08/21/2019  . Throat pain 01/28/2019  . Odynophagia 01/28/2019  . Neck pain 01/28/2019  . Thyroiditis 01/12/2019  . 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/2018  . Fatty liver 12/11/2018  . PCOS (polycystic ovarian syndrome) 11/11/2018  . Seasonal allergies 09/22/2018  . 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 Migraine with aura and without status migrainosus, not intractable 09/11/2012  . Variants of migraine, not elsewhere classified, without mention of intractable migraine without mention of status migrainosus 09/11/2012  . Unspecified constipation 09/11/2012  . Syncope 08/31/2010  . Neck Pain 08/31/2010  . Insomnia 08/31/2010    09/02/2010 MS, PT 10/20/20 9:47 PM  [facility] Outpatient Rehabilitation [facility] [address] [place], [place], [place] Phone: [phone]   Fax:  [phone]  Name: Maria Smith MRN: Valerie Roys Date of Birth: February 07, 1996

## 2020-10-27 ENCOUNTER — Ambulatory Visit: Payer: No Typology Code available for payment source

## 2020-10-27 ENCOUNTER — Other Ambulatory Visit: Payer: Self-pay

## 2020-10-27 ENCOUNTER — Ambulatory Visit: Payer: No Typology Code available for payment source | Admitting: Physical Therapy

## 2020-10-27 DIAGNOSIS — M546 Pain in thoracic spine: Secondary | ICD-10-CM

## 2020-10-27 DIAGNOSIS — R293 Abnormal posture: Secondary | ICD-10-CM

## 2020-10-27 DIAGNOSIS — M6281 Muscle weakness (generalized): Secondary | ICD-10-CM

## 2020-10-27 DIAGNOSIS — M7062 Trochanteric bursitis, left hip: Secondary | ICD-10-CM

## 2020-10-27 DIAGNOSIS — R29898 Other symptoms and signs involving the musculoskeletal system: Secondary | ICD-10-CM

## 2020-10-28 ENCOUNTER — Encounter: Payer: Self-pay | Admitting: Family Medicine

## 2020-10-28 ENCOUNTER — Telehealth (INDEPENDENT_AMBULATORY_CARE_PROVIDER_SITE_OTHER): Payer: No Typology Code available for payment source | Admitting: Family Medicine

## 2020-10-28 DIAGNOSIS — M7062 Trochanteric bursitis, left hip: Secondary | ICD-10-CM

## 2020-10-28 NOTE — Therapy (Signed)
[facility] Outpatient Rehabilitation [facility] [address] [place], [place], [address] Phone: [phone]   Fax:  [phone]  Physical Therapy Treatment  Patient Details  Name: Maria Smith MRN: [id number] Date of Birth: [birth year] Referring Provider (PT): Sheliah Hatch MD   Encounter Date: 10/27/2020   PT End of Session - 10/27/20 1450    Visit Number 4    Number of Visits 12    Date for PT Re-Evaluation 11/05/20    Authorization Type [facility] Focus plan    PT Start Time 1448    PT Stop Time 1532    PT Time Calculation (min) 44 min    Activity Tolerance Patient tolerated treatment well    Behavior During Therapy [facility] for tasks assessed/performed           Past Medical History:  Diagnosis Date  . Allergic rhinitis   . Allergy    SEASONAL  . Asthma   . Eczema   . Environmental allergies    Age 25 years  . Fatty liver 12/11/2018   Noted on CT 6/20  . Generalized headaches    Began at age 51 years  . GERD (gastroesophageal reflux disease)   . Influenza    Age 25 years  . 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 Streptococcal infection(041.00)    Age 25 and 25 years old  . TB lung, latent    2017  . 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 TONSILLECTOMY  2012  . WISDOM TOOTH EXTRACTION      There were no vitals filed for this visit.   Subjective Assessment - 10/27/20 1457    Subjective Pt reports her L hip was doing better until she slipped chasing her puppy and fell on her L hip and side    Diagnostic tests Xrays WNL    Patient Stated Goals She want to find a way to  decrease pain.    Currently in Pain? Yes    Pain Score 0-No pain    Pain Location Rib cage    Pain Orientation Left    Pain Descriptors / Indicators Aching    Pain Type Chronic pain    Pain Onset More than a month ago    Pain Frequency Intermittent    Multiple Pain Sites Yes     Pain Score 10    Pain Location Hip    Pain Orientation Left    Pain Descriptors / Indicators Aching    Pain Type Chronic pain    Pain Onset More than a month ago    Pain Frequency Constant    Aggravating Factors  caring for my 69 mth old twins, sitting                             [facility] Adult PT Treatment/Exercise - 10/28/20 0001      Exercises   Exercises Knee/Hip      Knee/Hip Exercises: Stretches   ITB Stretch Right;Left;2 reps;30 seconds    Piriformis Stretch Left;30 seconds;2 reps      Knee/Hip Exercises: Supine   Bridges Both;10 reps    Other Supine Knee/Hip Exercises SL Clams; 15x; green Tband      Knee/Hip Exercises: Sidelying   Other Sidelying Knee/Hip Exercises Planks; L and R; 10x      Knee/Hip Exercises: Prone   Straight  Leg Raises Left;1 set;10 reps    Straight Leg Raises Limitations Back kicks      Modalities   Modalities Iontophoresis      Iontophoresis   Type of Iontophoresis Dexamethasone    Location L lateral hip    Dose 4 mg/ml; 1 ml    Time 6 hours                  PT Education - 10/28/20 0733    Education Details Updated HEP    Person(s) Educated Patient    Methods Explanation;Demonstration;Tactile cues;Verbal cues;Handout    Comprehension Verbalized understanding;Returned demonstration;Verbal cues required;Tactile cues required            PT Short Term Goals - 09/24/20 1002      PT SHORT TERM GOAL #1   Title Independent with initial HEP    Time 3    Period Weeks    Status New      PT SHORT TERM GOAL #2   Title She will report 30% decr pain and improved activity at work and home    Time 6    Period Weeks    Status New             PT Long Term Goals - 09/24/20 1001      PT LONG TERM GOAL #1   Title independent with HEP    Time 6    Period Weeks    Status New      PT LONG TERM GOAL #2   Title perform thoracolumbar ROM without increase in pain for improved function    Time 6    Period Weeks     Status New      PT LONG TERM GOAL #3   Title FOTO score improved to 67 for improved function    Time 6    Period Weeks    Status New      PT LONG TERM GOAL #4   Title demonstrate proper lifting techniques to reduce risk of reinjury    Time 6    Period Weeks    Status New      PT LONG TERM GOAL #5   Title report 50% improvement in caring for twin babies for improved function    Time 6    Period Weeks    Status New                 Plan - 10/27/20 1450    Clinical Impression Statement Pt is not experiencing L lateral rib or costochondral pain today. L hip pain is increased after a fall landing of the L hip when chasing her puppy. Ther ex was completed to address L hip strength and flexibility of the L hip ERs. Iontophoresis was  completed again today with pt having a positive response with reduction in pain until the fall. Pt will continue to benefit from PT for reduction in pain and strengthening to optimize function.    Personal Factors and Comorbidities Past/Current Experience;Time since onset of injury/illness/exacerbation;Comorbidity 1    Comorbidities posture    Examination-Activity Limitations Reach Overhead;Caring for Others;Carry;Lift    Examination-Participation Restrictions Cleaning;Occupation;Community Activity;Laundry    Stability/Clinical Decision Making Evolving/Moderate complexity    Clinical Decision Making Moderate    Rehab Potential Good    PT Frequency 2x / week    PT Duration 6 weeks    PT Treatment/Interventions Patient/family education;Therapeutic exercise;Therapeutic activities;Iontophoresis 4mg /ml Dexamethasone;Moist Heat;Passive range of motion;Dry needling;Manual techniques    PT Next  Visit Plan Progress HEP, Continue l hip strengthening. Manual and modalities as needed.    PT Home Exercise Plan [id number]    Consulted and Agree with Plan of Care Patient           Patient will benefit from skilled therapeutic intervention in order to improve  the following deficits and impairments:  Postural dysfunction,Decreased activity tolerance,Pain,Increased muscle spasms,Impaired UE functional use  Visit Diagnosis: Pain in thoracic spine  Abnormal posture  Other symptoms and signs involving the musculoskeletal system  Muscle weakness (generalized)  Greater trochanteric bursitis, left     Problem List Patient Active Problem List   Diagnosis Date Noted  . Left-sided chest wall pain 03/23/2020  . Positive ANA (antinuclear antibody) 03/23/2020  . Asthma 02/10/2020  . Allergic rhinitis 02/10/2020  . GERD (gastroesophageal reflux disease) 02/10/2020  . History of TB (tuberculosis) 08/21/2019  . Throat pain 01/28/2019  . Odynophagia 01/28/2019  . Neck pain 01/28/2019  . Thyroiditis 01/12/2019  . 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/2018  . Fatty liver 12/11/2018  . PCOS (polycystic ovarian syndrome) 11/11/2018  . Seasonal allergies 09/22/2018  . 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 Migraine with aura and without status migrainosus, not intractable 09/11/2012  . Variants of migraine, not elsewhere classified, without mention of intractable migraine without mention of status migrainosus 09/11/2012  . Unspecified constipation 09/11/2012  . Syncope 08/31/2010  . Neck Pain 08/31/2010  . Insomnia 08/31/2010    Joellyn Rued MS, PT 10/28/20 8:59 AM  [facility] Outpatient Rehabilitation [facility] [address] [place], [place], [address] Phone: [phone]   Fax:  [phone]  Name: Maria Smith MRN: [id number] Date of Birth: 01/10/1996

## 2020-10-28 NOTE — Progress Notes (Signed)
Virtual Visit via Video Note  I connected with Maria Smith on 10/28/20 at 10:30 AM EDT by a video enabled telemedicine application 2/2 COVID-19 pandemic and verified that I am speaking with the correct person using two identifiers.  Location patient: home Location provider:work or home office Persons participating in the virtual visit: patient, provider  I discussed the limitations of evaluation and management by telemedicine and the availability of in person appointments. The patient expressed understanding and agreed to proceed.   HPI:  Pt's L hip with continued pain due to bursitis.  Mild improvement since therapy, but more of a achy nagging pain.  Pt fell over the wknd while trying to give her puppy a bath.  She landed on her hip.  Pt had questions about joint injections. Pt has to pick up her 24 and 26 lb twins.  Using ice and biofreeze on hip.  Doing more sitting at work.  Notes weakness in LLE muscles and low back pain as trying to put less pressure on hip when walking. Sleeping with a pillow in between her legs.  Started taking PNVs as she and her wife are hoping to become pregnant again.  ROS: See pertinent positives and negatives per HPI.  Past Medical History:  Diagnosis Date  . Allergic rhinitis   . Allergy    SEASONAL  . Asthma   . Eczema   . Environmental allergies    Age 25 years  . Fatty liver 12/11/2018   Noted on CT 6/20  . Generalized headaches    Began at age 44 years  . GERD (gastroesophageal reflux disease)   . Influenza    Age 68 years  . 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 Streptococcal infection(041.00)    Age 60 and 25 years old  . TB lung, latent    2017  . 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 TONSILLECTOMY  2012  . WISDOM TOOTH EXTRACTION      Family History  Problem Relation Age of Onset  . Migraines Paternal Grandmother   . Migraines  Paternal Grandfather   . HIV Father   . Diabetes Mother   . Hypertension Mother   . Anemia Mother   . COPD Mother   . CVA Mother   . Congestive Heart Failure Mother   . Cirrhosis Mother   . Obesity Mother   . Irritable bowel syndrome Mother   . Rheum arthritis Mother   . Ulcers Brother   . Anemia Sister   . Cholecystitis Brother   . Migraines Paternal Aunt   . Migraines Maternal Uncle        Maternal Great Uncle  . Diabetes Other        Family History  . Hypertension Other        Family History  . Depression Other        Family History  . Asthma Other        Family History  . Allergic rhinitis Other        Family History  . Asthma Brother   . Colon cancer Neg Hx   . Colon polyps Neg Hx   . Esophageal cancer Neg Hx   . Rectal cancer Neg Hx   . Stomach cancer Neg Hx      Current Outpatient Medications:  .  albuterol (VENTOLIN HFA) 108 (90 Base) MCG/ACT inhaler, Inhale 2 puffs  into the lungs as needed., Disp: , Rfl:  .  Ascorbic Acid (VITAMIN C) 1000 MG tablet, Take 1,000 mg by mouth daily., Disp: , Rfl:  .  benzonatate (TESSALON PERLES) 100 MG capsule, Take 1 capsule (100 mg total) by mouth 3 (three) times daily as needed., Disp: 20 capsule, Rfl: 0 .  fluticasone (FLONASE) 50 MCG/ACT nasal spray, Place 2 sprays into both nostrils daily., Disp: 16 g, Rfl: 2 .  fluticasone (FLOVENT HFA) 44 MCG/ACT inhaler, Inhale 2 puffs into the lungs in the morning and at bedtime., Disp: 1 each, Rfl: 0 .  Multiple Vitamin (MULTIVITAMIN) tablet, Take 1 tablet by mouth daily., Disp: , Rfl:  .  omeprazole (PRILOSEC) 40 MG capsule, Take 1 capsule (40 mg total) by mouth 2 (two) times daily., Disp: 60 capsule, Rfl: 3  EXAM:  VITALS per patient if applicable: RR between 12-20 bpm  GENERAL: alert, oriented, appears well and in no acute distress  HEENT: atraumatic, conjunctiva clear, no obvious abnormalities on inspection of external nose and ears  NECK: normal movements of the head and  neck  LUNGS: on inspection no signs of respiratory distress, breathing rate appears normal, no obvious gross SOB, gasping or wheezing  CV: no obvious cyanosis  MS: moves all visible extremities without noticeable abnormality  PSYCH/NEURO: pleasant and cooperative, no obvious depression or anxiety, speech and thought processing grossly intact  ASSESSMENT AND PLAN:  Discussed the following assessment and plan:  Trochanteric bursitis of left hip -mild improvement. -continue treatment of symptoms with flexeril and ice -continue PT -consider aspercreme BID -discussed further treatment options including joint injections, medrol dose pack, water aerobics -Plan: referral to Ortho   I discussed the assessment and treatment plan with the patient. The patient was provided an opportunity to ask questions and all were answered. The patient agreed with the plan and demonstrated an understanding of the instructions.   The patient was advised to call back or seek an in-person evaluation if the symptoms worsen or if the condition fails to improve as anticipated.  Deeann Saint, MD

## 2020-11-02 ENCOUNTER — Ambulatory Visit (INDEPENDENT_AMBULATORY_CARE_PROVIDER_SITE_OTHER): Payer: No Typology Code available for payment source | Admitting: Family Medicine

## 2020-11-02 ENCOUNTER — Other Ambulatory Visit: Payer: Self-pay

## 2020-11-02 ENCOUNTER — Encounter: Payer: Self-pay | Admitting: Family Medicine

## 2020-11-02 DIAGNOSIS — M25552 Pain in left hip: Secondary | ICD-10-CM | POA: Diagnosis not present

## 2020-11-02 DIAGNOSIS — M2550 Pain in unspecified joint: Secondary | ICD-10-CM | POA: Diagnosis not present

## 2020-11-02 DIAGNOSIS — R768 Other specified abnormal immunological findings in serum: Secondary | ICD-10-CM | POA: Diagnosis not present

## 2020-11-02 DIAGNOSIS — R5383 Other fatigue: Secondary | ICD-10-CM | POA: Diagnosis not present

## 2020-11-02 NOTE — Progress Notes (Signed)
Office Visit Note   Patient: Maria Smith           Date of Birth: 07/14/1995           MRN: [id number] Visit Date: 11/02/2020 Requested by: Deeann Saint, MD [address] [place],  [place] [address] PCP: Deeann Saint, MD  Subjective: Chief Complaint  Patient presents with  . Left Hip - Pain    Pain in the lateral and posterior hip x 1&1/2 months. Pain down into the thigh. NKI Has been going to PT and getting dry needling. Has fallen directly on the hip 2 weeks ago, slipped while chasing after her puppy. Ice helps a little. Naproxen does not help.    HPI: He is here with left hip pain.  Symptoms started about a month and a half ago, no definite injury.  Pain on the posterior lateral aspect of the hip.  She has been doing physical therapy but the pain does not seem to be going away.  She tried naproxen but it has not helped.  No pain below the knee.  No fevers or chills.  No rash in her skin.  She does have a history of positive ANA 1 year ago which was ordered after she was complaining of fatigue and multiple joint pains.  She saw a rheumatologist but they did not think she had autoimmune disease.  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              ROS:   All other systems were reviewed and are negative.  Objective: Vital Signs: There were no vitals taken for this visit.  Physical Exam:  General:  Alert and oriented, in no acute distress. Pulm:  Breathing unlabored. Psy:  Normal mood, congruent affect.  Left hip: She has good range of motion with no pain in the groin area on internal rotation.  She is tender over the greater trochanter and that seems to reproduce her pain.  Slight weakness with abduction against resistance.  Remainder of lower extremity strength and reflexes are normal.  Imaging: No results found.  Assessment & Plan: 1.  Left hip pain, suspect greater trochanter syndrome but cannot rule out lumbar foraminal  stenosis. -We will order x-rays and MRI scan.  Depending on results, could contemplate cortisone injection.  We will also repeat ANA and check some other rheumatologic tests.     Procedures: No procedures performed        PMFS History: Patient Active Problem List   Diagnosis Date Noted  . Left-sided chest wall pain 03/23/2020  . Positive ANA (antinuclear antibody) 03/23/2020  . Asthma 02/10/2020  . Allergic rhinitis 02/10/2020  . GERD (gastroesophageal reflux disease) 02/10/2020  . History of TB (tuberculosis) 08/21/2019  . Throat pain 01/28/2019  . Odynophagia 01/28/2019  . Neck pain 01/28/2019  . Thyroiditis 01/12/2019  . 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/2018  . Fatty liver 12/11/2018  . PCOS (polycystic ovarian syndrome) 11/11/2018  . Seasonal allergies 09/22/2018  . 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 Migraine with aura and without status migrainosus, not intractable 09/11/2012  . Variants of migraine, not elsewhere classified, without mention of intractable migraine without mention of status migrainosus 09/11/2012  . Unspecified constipation 09/11/2012  . Syncope 08/31/2010  . Neck Pain 08/31/2010  . Insomnia 08/31/2010   Past Medical History:  Diagnosis Date  . Allergic rhinitis   . Allergy    SEASONAL  . Asthma   .  Eczema   . Environmental allergies    Age 70 years  . Fatty liver 12/11/2018   Noted on CT 6/20  . Generalized headaches    Began at age 54 years  . GERD (gastroesophageal reflux disease)   . Influenza    Age 64 years  . 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 Streptococcal infection(041.00)    Age 84 and 25 years old  . TB lung, latent    2017  . 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/2018   Diffuse enlargement on [place] 6/20    Family History  Problem Relation Age of Onset  . Migraines Paternal Grandmother   . Migraines Paternal Grandfather   . HIV Father   . Diabetes Mother   . Hypertension Mother   . Anemia Mother   . COPD Mother   . CVA Mother   . Congestive  Heart Failure Mother   . Cirrhosis Mother   . Obesity Mother   . Irritable bowel syndrome Mother   . Rheum arthritis Mother   . Ulcers Brother   . Anemia Sister   . Cholecystitis Brother   . Migraines Paternal Aunt   . Migraines Maternal Uncle        Maternal Great Uncle  . Diabetes Other        Family History  . Hypertension Other        Family History  . Depression Other        Family History  . Asthma Other        Family History  . Allergic rhinitis Other        Family History  . Asthma Brother   . Colon cancer Neg Hx   . Colon polyps Neg Hx   . Esophageal cancer Neg Hx   . Rectal cancer Neg Hx   . Stomach cancer Neg Hx     Past Surgical History:  Procedure Laterality Date  . TONSILLECTOMY  2012  . WISDOM TOOTH EXTRACTION     Social History   Occupational History  . Occupation: dietary  Tobacco Use  . Smoking status: Never Smoker  . Smokeless tobacco: Never Used  Vaping Use  . Vaping Use: Never used  Substance and Sexual Activity  . Alcohol use: Not Currently  . Drug use: No  . Sexual activity: Yes    Partners: Female    Comment: female partner

## 2020-11-03 ENCOUNTER — Ambulatory Visit: Payer: No Typology Code available for payment source | Admitting: Physical Therapy

## 2020-11-03 DIAGNOSIS — R198 Other specified symptoms and signs involving the digestive system and abdomen: Secondary | ICD-10-CM

## 2020-11-03 DIAGNOSIS — R070 Pain in throat: Secondary | ICD-10-CM

## 2020-11-03 DIAGNOSIS — M546 Pain in thoracic spine: Secondary | ICD-10-CM

## 2020-11-03 DIAGNOSIS — R0989 Other specified symptoms and signs involving the circulatory and respiratory systems: Secondary | ICD-10-CM

## 2020-11-03 DIAGNOSIS — R29898 Other symptoms and signs involving the musculoskeletal system: Secondary | ICD-10-CM

## 2020-11-03 DIAGNOSIS — K219 Gastro-esophageal reflux disease without esophagitis: Secondary | ICD-10-CM

## 2020-11-03 DIAGNOSIS — M6281 Muscle weakness (generalized): Secondary | ICD-10-CM

## 2020-11-03 DIAGNOSIS — R1310 Dysphagia, unspecified: Secondary | ICD-10-CM

## 2020-11-03 DIAGNOSIS — R293 Abnormal posture: Secondary | ICD-10-CM

## 2020-11-03 DIAGNOSIS — M7062 Trochanteric bursitis, left hip: Secondary | ICD-10-CM

## 2020-11-03 NOTE — Therapy (Signed)
[facility] [address] [place], [place], [address] Phone: [phone]   Fax:  [phone]  Physical Therapy Treatment  Patient Details  Name: Maria Smith MRN: [id number] Date of Birth: July 13, 1995 Referring Provider (PT): Sheliah Hatch MD   Encounter Date: 11/03/2020   PT End of Session - 11/03/20 2230    Visit Number 5    Number of Visits 12    Date for PT Re-Evaluation 11/05/20    Authorization Type [facility] Focus plan    PT Start Time 1745    PT Stop Time 1818    PT Time Calculation (min) 33 min    Activity Tolerance Patient limited by pain    Behavior During Therapy [facility] for tasks assessed/performed           Past Medical History:  Diagnosis Date  . Allergic rhinitis   . Allergy    SEASONAL  . Asthma   . Eczema   . Environmental allergies    Age 25 years  . Fatty liver 12/11/2018   Noted on CT 6/20  . Generalized headaches    Began at age 25 years  . GERD (gastroesophageal reflux disease)   . Influenza    Age 25 years  . 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 Streptococcal infection(041.00)    Age 25 and 25 years old  . TB lung, latent    2017  . 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 TONSILLECTOMY  2012  . WISDOM TOOTH EXTRACTION      There were no vitals filed for this visit.   Subjective Assessment - 11/03/20 1832    Subjective Patient reports she is not doing well and all, presented with an antalgic L gait.  Patient states she went to the doctor yesterday because of the significant pain that now radiates from low back area to around hip into ant thigh. MD is planning to get XRay and MRI, has done blood work to check ANA level.  Patient reports she had an episode of waking up with bruises on L thigh with tenderness without incident and they went away 2 days later. Patient states she cannot do the side  planks because they are painful especially when lowering back onto hip.    Currently in Pain? Yes    Pain Score 3    sometimes 0/10   Pain Score 9    Pain Location Hip    Pain Orientation Left    Pain Descriptors / Indicators Sharp;Radiating    Pain Type Acute pain              [facility] PT Assessment - 11/03/20 0001      Assessment   Medical Diagnosis chest pain LT    Referring Provider (PT) Sheliah Hatch MD    Next MD Visit As needed                         [facility] Adult PT Treatment/Exercise - 11/03/20 0001      Lumbar Exercises: Stretches   Prone on Elbows Stretch 60 seconds;3 reps   consecutive   Press Ups 10 reps      Knee/Hip Exercises: Stretches   Other Knee/Hip Stretches ITB stretch - sidelying on Right    Other Knee/Hip Stretches Muscle energy in SLR position with progressive stretch  Manual Therapy   Manual Therapy Soft tissue mobilization    Soft tissue mobilization ITB distal to almost proximal avoiding GH, glute and SI region.                  PT Education - 11/03/20 2227    Education Details HEP modified with focus on pain management of L hip/pelvis/thigh symptoms (see instructions).  Recommended patient resume exercises on hold as tolerated, okay to stop performing side plank.    Person(s) Educated Patient    Methods Explanation;Demonstration;Verbal cues    Comprehension Verbalized understanding;Returned demonstration;Verbal cues required            PT Short Term Goals - 09/24/20 1002      PT SHORT TERM GOAL #1   Title Independent with initial HEP    Time 3    Period Weeks    Status New      PT SHORT TERM GOAL #2   Title She will report 30% decr pain and improved activity at work and home    Time 6    Period Weeks    Status New             PT Long Term Goals - 09/24/20 1001      PT LONG TERM GOAL #1   Title independent with HEP    Time 6    Period Weeks    Status New      PT LONG TERM GOAL #2   Title  perform thoracolumbar ROM without increase in pain for improved function    Time 6    Period Weeks    Status New      PT LONG TERM GOAL #3   Title FOTO score improved to 67 for improved function    Time 6    Period Weeks    Status New      PT LONG TERM GOAL #4   Title demonstrate proper lifting techniques to reduce risk of reinjury    Time 6    Period Weeks    Status New      PT LONG TERM GOAL #5   Title report 50% improvement in caring for twin babies for improved function    Time 6    Period Weeks    Status New                 Plan - 11/03/20 2232    Clinical Impression Statement Patient presents with significant L side hip/pelvic/thigh pain, altered gait and moderate compliance with HEP secondary pain during some exercises.  See instructions for modifications to HEP including some muscle energy techniques for SI involvement and extension biased exercises for radicular symptoms. Treatment ended early secondary significant L hip pain.  Patient will benefit from continued skilled PT to address deficits and transition back to focus on both hip/pelvic/LBP and rib cage pain to return to community activity with decreased pain.    Personal Factors and Comorbidities Past/Current Experience;Time since onset of injury/illness/exacerbation;Comorbidity 1    Comorbidities posture    Examination-Activity Limitations Reach Overhead;Caring for Others;Carry;Lift    Examination-Participation Restrictions Cleaning;Occupation;Community Activity;Laundry    PT Treatment/Interventions Patient/family education;Therapeutic exercise;Therapeutic activities;Iontophoresis 4mg /ml Dexamethasone;Moist Heat;Passive range of motion;Dry needling;Manual techniques    PT Next Visit Plan Progress HEP, Continue l hip strengthening. Manual and modalities as needed.    PT Home Exercise Plan [id number]    Consulted and Agree with Plan of Care Patient  Patient will benefit from skilled therapeutic  intervention in order to improve the following deficits and impairments:  Postural dysfunction,Decreased activity tolerance,Pain,Increased muscle spasms,Impaired UE functional use  Visit Diagnosis: Pain in thoracic spine  Abnormal posture  Globus sensation  Throat pain  Other symptoms and signs involving the musculoskeletal system  Gastroesophageal reflux disease, unspecified whether esophagitis present  Dysphagia, unspecified type  Muscle weakness (generalized)  Greater trochanteric bursitis, left     Problem List Patient Active Problem List   Diagnosis Date Noted  . Left-sided chest wall pain 03/23/2020  . Positive ANA (antinuclear antibody) 03/23/2020  . Asthma 02/10/2020  . Allergic rhinitis 02/10/2020  . GERD (gastroesophageal reflux disease) 02/10/2020  . History of TB (tuberculosis) 08/21/2019  . Throat pain 01/28/2019  . Odynophagia 01/28/2019  . Neck pain 01/28/2019  . Thyroiditis 01/12/2019  . 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/2018  . Fatty liver 12/11/2018  . PCOS (polycystic ovarian syndrome) 11/11/2018  . Seasonal allergies 09/22/2018  . 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 Migraine with aura and without status migrainosus, not intractable 09/11/2012  . Variants of migraine, not elsewhere classified, without mention of intractable migraine without mention of status migrainosus 09/11/2012  . Unspecified constipation 09/11/2012  . Syncope 08/31/2010  . Neck Pain 08/31/2010  . Insomnia 08/31/2010    Myrla Halsted, PT 11/03/2020, 10:41 PM  [facility] [address] [place], [place], [address] Phone: [phone]   Fax:  [phone]  Name: Shauniece Kwan MRN: [id number] Date of Birth: 09-13-1995

## 2020-11-03 NOTE — Patient Instructions (Signed)
[id number] updated with additions and holds: HOLD until pain decreases: Right lying Open Book Lat side stretch QL stretch Clam with resistance Side plank  Do by choice: Doorway stretch, IR/ER, Flex/Abd with weight, Front Arm support kick back   Continue: Piriformis stretch, bridge  Additions: Muscle energy with straight leg in doorway Sidelying ITB stretch ITB massage with can Press ups vs POE

## 2020-11-04 ENCOUNTER — Telehealth: Payer: Self-pay | Admitting: Family Medicine

## 2020-11-04 LAB — CYCLIC CITRUL PEPTIDE ANTIBODY, IGG: Cyclic Citrullin Peptide Ab: <16 UNITS

## 2020-11-04 LAB — VITAMIN D 25 HYDROXY (VIT D DEFICIENCY, FRACTURES): Vit D, 25-Hydroxy: 30 ng/mL (ref 30–100)

## 2020-11-04 LAB — ANTI-NUCLEAR AB-TITER (ANA TITER): ANA Titer 1: 1:80 {titer} — ABNORMAL HIGH

## 2020-11-04 LAB — RHEUMATOID FACTOR: Rheumatoid fact SerPl-aCnc: <14 IU/mL (ref <14)

## 2020-11-04 LAB — ANA: Anti Nuclear Antibody (ANA): POSITIVE — AB

## 2020-11-04 NOTE — Telephone Encounter (Signed)
Labs are notable for the following:  ANA is positive again suggesting autoimmune disease.  Vitamin D is low-normal at 30, we want this to be 50-80.  I recommend taking vitamin D3 at 5000 IU daily.  Recheck in about 6 months.  Other labs were normal.

## 2020-11-05 ENCOUNTER — Ambulatory Visit: Payer: No Typology Code available for payment source

## 2020-11-11 ENCOUNTER — Ambulatory Visit: Payer: No Typology Code available for payment source

## 2020-11-15 ENCOUNTER — Other Ambulatory Visit: Payer: Self-pay | Admitting: Family Medicine

## 2020-11-15 DIAGNOSIS — M25552 Pain in left hip: Secondary | ICD-10-CM

## 2020-11-16 ENCOUNTER — Other Ambulatory Visit: Payer: No Typology Code available for payment source

## 2020-11-17 ENCOUNTER — Other Ambulatory Visit: Payer: No Typology Code available for payment source

## 2020-11-24 ENCOUNTER — Ambulatory Visit: Payer: No Typology Code available for payment source | Admitting: Physical Therapy

## 2020-11-24 ENCOUNTER — Telehealth: Payer: Self-pay | Admitting: Physical Therapy

## 2020-11-24 NOTE — Telephone Encounter (Signed)
Left message of building closure and cancelled appointment.

## 2020-12-01 ENCOUNTER — Ambulatory Visit: Payer: No Typology Code available for payment source | Admitting: Physical Therapy

## 2020-12-08 ENCOUNTER — Other Ambulatory Visit: Payer: Self-pay

## 2020-12-08 ENCOUNTER — Ambulatory Visit: Payer: No Typology Code available for payment source | Admitting: Physical Therapy

## 2020-12-08 ENCOUNTER — Ambulatory Visit: Payer: No Typology Code available for payment source | Attending: Internal Medicine | Admitting: Physical Therapy

## 2020-12-08 ENCOUNTER — Encounter: Payer: Self-pay | Admitting: Physical Therapy

## 2020-12-08 DIAGNOSIS — M7062 Trochanteric bursitis, left hip: Secondary | ICD-10-CM | POA: Insufficient documentation

## 2020-12-08 DIAGNOSIS — R293 Abnormal posture: Secondary | ICD-10-CM | POA: Insufficient documentation

## 2020-12-08 DIAGNOSIS — M546 Pain in thoracic spine: Secondary | ICD-10-CM | POA: Insufficient documentation

## 2020-12-08 DIAGNOSIS — M6281 Muscle weakness (generalized): Secondary | ICD-10-CM | POA: Insufficient documentation

## 2020-12-08 DIAGNOSIS — R29898 Other symptoms and signs involving the musculoskeletal system: Secondary | ICD-10-CM | POA: Diagnosis present

## 2020-12-08 NOTE — Therapy (Signed)
[place], [place], [address] Phone: [phone]   Fax:  [phone]  Physical Therapy Treatment/DISCHARGE SUMMARY  Patient Details  Name: Maria Smith MRN: [id number] Date of Birth: 1995/09/30 Referring Provider (PT): Vernelle Emerald MD   Encounter Date: 12/08/2020   PT End of Session - 12/08/20 1555     Visit Number 6    Number of Visits 12    Date for PT Re-Evaluation 11/05/20    Authorization Type [facility] Focus plan    PT Start Time 2297    PT Stop Time 9892    PT Time Calculation (min) 38 min    Activity Tolerance Patient limited by pain    Behavior During Therapy [facility] for tasks assessed/performed             Past Medical History:  Diagnosis Date   Allergic rhinitis    Allergy    SEASONAL   Asthma    Eczema    Environmental allergies    Age 9 years   Fatty liver 12/11/2018   Noted on CT 6/20   Generalized headaches    Began at age 4 years   GERD (gastroesophageal reflux disease)    Influenza    Age 24 years   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   Subjective Assessment - 12/08/20 1548     Subjective Patient reports she has a positive A&A antibody test again a year after an initial positve test as well as Vitaman D deficiency.  Patient reports she has made some changes to her diet and has started a Vitaman D supplement under her doctors care.  Patient states she is being referred to rhumatology for further assessment of autoimmune disease.  Patient reports since she has made the PO changes her hip pain has improved somewhat.    Limitations Lifting    Diagnostic  tests Xrays WNL    Patient Stated Goals She want to find a way to  decrease pain.    Pain Score 0-No pain    Pain Location Rib cage    Pain Orientation Left    Pain Descriptors / Indicators Aching    Pain Onset More than a month ago    Pain Frequency Intermittent    Pain Score 5    Pain Location Hip    Pain Orientation Left    Pain Descriptors / Indicators Patsi Sears PT Assessment - 12/08/20 0001       Assessment   Medical Diagnosis Chest/rib pain Left    Referring Provider (PT) Vernelle Emerald MD    Next MD Visit As needed      Precautions   Precautions None      Restrictions   Weight Bearing Restrictions No      Observation/Other Assessments   Focus on Therapeutic Outcomes (FOTO)  64% (85% predicted)      PROM   Overall PROM Comments Less pain was reproduced c IR most significantly, but all motions generated mild L  hip pain      Strength   Overall Strength Other (comment)   Grossly at or > 3+/5   Overall Strength Comments Pain was reproduced most significanty c resisted hip ER, but all motions generated mild ant/lat L hip pain      Flexibility   Hamstrings R=35, L=40 from 90/90      Palpation   Palpation comment Tender at L lateral hip                           [facility] Adult PT Treatment/Exercise - 12/08/20 0001       Knee/Hip Exercises: Supine   Bridges 10 reps;Both    Bridges with [employer] 10 reps;Both    Other Supine Knee/Hip Exercises Lower trunk rotation B LEs on Tball    Other Supine Knee/Hip Exercises Bridge B LEs on ball x10      Knee/Hip Exercises: Sidelying   Hip ABduction Left;5 reps    Hip ADduction 10 reps;2 sets    Clams SL Clams; 15x; green Tband      Knee/Hip Exercises: Prone   Straight Leg Raises 10 reps;2 sets                    PT Education - 12/08/20 1723     Education Details Recommended patient continue with lumbar extension throughout the day and 4-5x/week performing the HEP.     Person(s) Educated Patient    Methods Explanation;Demonstration;Verbal cues    Comprehension Verbalized understanding;Returned demonstration;Verbal cues required              PT Short Term Goals - 12/08/20 1613       PT SHORT TERM GOAL #1   Title Independent with initial HEP    Time 3    Period Weeks    Status Achieved      PT SHORT TERM GOAL #2   Title She will report 30% decr pain and improved activity at work and home    Time 6    Period Weeks    Status Achieved               PT Long Term Goals - 12/08/20 1613       PT LONG TERM GOAL #1   Title independent with HEP    Time 6    Period Weeks    Status Achieved      PT LONG TERM GOAL #2   Title perform thoracolumbar ROM without increase in pain for improved function    Time 6    Period Weeks    Status Partially Met      PT LONG TERM GOAL #3   Title FOTO score improved to 67 for improved function    Time 6    Period Weeks    Status Partially Met   64%     PT LONG TERM GOAL #4   Title demonstrate proper lifting techniques to reduce risk of reinjury    Time 6    Period Weeks    Status Achieved      PT LONG TERM GOAL #5   Title report 50% improvement in caring for twin babies for improved function    Time 6    Period Weeks    Status Partially Met                   Plan - 12/08/20 1726     Clinical Impression Statement Patient  reports she has a positive A&A antibody test again a year after an initial positve test as well as Vitaman D deficiency.  Patient reports she has made some changes to her diet and has started a Vitaman D supplement under her doctors care.  Patient states she is being referred to rhumatology for further assessment of autoimmune disease.  Patient reports since she has made the PO changes her hip pain has improved somewhat.  Long discussion with patient about options to recert for more physical therapy vs discontinuing PT for now until the other medical issues are  further assessed and possible treatments started to see if further progress on hip pain happens. Patient has met 2 LTGs and partially met 3 LTGs.  Patient agreed that waiting for now and saving the therapy and therapy dollars until it may be more effective or if needed would be best. Patient has made some progress with pain, hip strength and hip ROM, although hamstrings seem a bit tighter and patient was advised to be sure to perform the stretch regularly.  Patient has a good HEP and is independent and compliant.  Patient will be discharged at this time form PT and understands she will need a new referral when/if she decides to return to therapy.    Comorbidities posture    Examination-Activity Limitations Reach Overhead;Caring for Others;Carry;Lift    Examination-Participation Restrictions Cleaning;Occupation;Community Activity;Laundry    PT Treatment/Interventions Patient/family education;Therapeutic exercise;Therapeutic activities;Iontophoresis 89m/ml Dexamethasone;Moist Heat;Passive range of motion;Dry needling;Manual techniques    PT Next Visit Plan Discharged this visit.    PT Home Exercise Plan [id number]    Consulted and Agree with Plan of Care Patient             Patient will benefit from skilled therapeutic intervention in order to improve the following deficits and impairments:  Postural dysfunction, Decreased activity tolerance, Pain, Increased muscle spasms, Impaired UE functional use  Visit Diagnosis: Pain in thoracic spine  Muscle weakness (generalized)  Greater trochanteric bursitis, left  Abnormal posture  Other symptoms and signs involving the musculoskeletal system     Problem List Patient Active Problem List   Diagnosis Date Noted   Left-sided chest wall pain 03/23/2020   Positive ANA (antinuclear antibody) 03/23/2020   Asthma 02/10/2020   Allergic rhinitis 02/10/2020   GERD (gastroesophageal reflux disease) 02/10/2020   History of TB (tuberculosis) 08/21/2019    Throat pain 01/28/2019   Odynophagia 01/28/2019   Neck pain 01/28/2019   Thyroiditis 01/12/2019   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/2018   Fatty liver 12/11/2018   PCOS (polycystic ovarian syndrome) 11/11/2018   Seasonal allergies 09/22/2018   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   Migraine with aura and without status migrainosus, not intractable 09/11/2012   Variants of migraine, not elsewhere classified, without mention of intractable migraine without mention of status migrainosus 09/11/2012   Unspecified constipation 09/11/2012   Syncope 08/31/2010   Neck Pain 08/31/2010   Insomnia 08/31/2010    DPollyann Samples PT 12/08/2020, 5:35 PM  [facility] [address]Phone: [phone]  Fax:  [phone] Name: Maria KuzelMRN: [id number] of Birth: [birth year] PHYSICAL THERAPY DISCHARGE SUMMARY  Visits from Start of Care: 6  Current functional level related to goals / functional outcomes: Improved gait pattern and ability to bend and quat.  Improved mobility at home and work.   Remaining deficits: L hip pain, mild L side pain with lifting, L hip weakness.  Education / Equipment: Continue HEP including lumbar extension throughout the day    Patient agrees to discharge. Patient goals were partially met. Patient is being discharged due to a change in medical status.  Pollyann Samples, PT

## 2020-12-15 ENCOUNTER — Other Ambulatory Visit (HOSPITAL_COMMUNITY): Payer: Self-pay

## 2020-12-15 ENCOUNTER — Telehealth (INDEPENDENT_AMBULATORY_CARE_PROVIDER_SITE_OTHER): Payer: No Typology Code available for payment source | Admitting: Family Medicine

## 2020-12-15 DIAGNOSIS — U071 COVID-19: Secondary | ICD-10-CM

## 2020-12-15 MED ORDER — BENZONATATE 100 MG PO CAPS
100.0000 mg | ORAL_CAPSULE | Freq: Three times a day (TID) | ORAL | 0 refills | Status: DC | PRN
  Filled 2020-12-15: qty 20, 7d supply, fill #0

## 2020-12-15 MED ORDER — ALBUTEROL SULFATE HFA 108 (90 BASE) MCG/ACT IN AERS
2.0000 | INHALATION_SPRAY | Freq: Four times a day (QID) | RESPIRATORY_TRACT | 3 refills | Status: DC | PRN
  Filled 2020-12-15: qty 18, 25d supply, fill #0

## 2020-12-15 NOTE — Patient Instructions (Addendum)
   ---------------------------------------------------------------------------------------------------------------------------      WORK SLIP:  Patient Anacarolina Evelyn,  01-Jun-1996, was seen for a medical visit today, 12/15/20 . Please excuse from work for a COVID like illness. We advise 10 days minimum from the onset of symptoms (12/13/20) PLUS 1 day of no fever and improved symptoms. Will defer to employer for a sooner return to work if symptoms have resolved, it is greater than 5 days since the positive test and the patient can wear a high-quality, tight fitting mask such as N95 or KN95 at all times for an additional 5 days. Would also suggest COVID19 antigen testing is negative prior to return.  Sincerely: E-signature: Dr. Kriste Basque, DO [facility] Ph: [phone]   ------------------------------------------------------------------------------------------------------------------------------   HOME CARE TIPS:  -I sent the medication(s) we discussed to your pharmacy: Meds ordered this encounter  Medications   albuterol (VENTOLIN HFA) 108 (90 Base) MCG/ACT inhaler    Sig: Inhale 2 puffs into the lungs every 6 (six) hours as needed for wheezing or shortness of breath.    Dispense:  1 each    Refill:  3   benzonatate (TESSALON PERLES) 100 MG capsule    Sig: Take 1 capsule (100 mg total) by mouth 3 (three) times daily as needed.    Dispense:  20 capsule    Refill:  0     -can use tylenol or aleve if needed for fevers, aches and pains per instructions  -can use nasal saline a few times per day if you have nasal congestion; sometimes  a short course of Afrin nasal spray for 3 days can help with symptoms as well  -stay hydrated, drink plenty of fluids and eat small healthy meals - avoid dairy  -can take 1000 IU ( ) Vit D3 and 100-500 mg of Vit C daily per instructions  -check out the CDC website for more information on home care, transmission  and treatment for COVID19  -follow up with your doctor in 2-3 days unless improving and feeling better  -stay home while sick, except to seek medical care. If you have COVID19, ideally it would be best to stay home for a full 10 days since the onset of symptoms PLUS one day of no fever and feeling better. Wear a good mask that fits snugly (such as N95 or KN95) if around others to reduce the risk of transmission.  It was nice to meet you today, and I really hope you are feeling better soon. I help [place] out with telemedicine visits on Tuesdays and Thursdays and am available for visits on those days. If you have any concerns or questions following this visit please schedule a follow up visit with your Primary Care doctor or seek care at a local urgent care clinic to avoid delays in care.    Seek in person care or schedule a follow up video visit promptly if your symptoms worsen, new concerns arise or you are not improving with treatment. Call 911 and/or seek emergency care if your symptoms are severe or life threatening.

## 2020-12-15 NOTE — Progress Notes (Signed)
Virtual Visit via Video Note  I connected with Debarah  on 12/15/20 at  4:00 PM EDT by a video enabled telemedicine application and verified that I am speaking with the correct person using two identifiers.  Location patient: home, [place] Location provider:work or home office Persons participating in the virtual visit: patient, provider  I discussed the limitations of evaluation and management by telemedicine and the availability of in person appointments. The patient expressed understanding and agreed to proceed.   HPI:  Acute telemedicine visit for Covid19: -Onset: 2 days ago and tested positive for covid -her family all has covid -Symptoms include:sore throat, nasal congestion, cough, body aches, fever the first few days - none today -Denies:CP, SOB, NVD, inability to eat/drink/get out of bed -Pertinent past medical history: mild asthma - no regular medication, currently undergoing work up for autoimmune disease -Pertinent medication allergies:No Known Allergies -COVID-19 vaccine status:2 doses and a booster  ROS: See pertinent positives and negatives per HPI.  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 Rifampin since 08/2015   Streptococcal infection(041.00)    Age 30 and 25 years old   TB lung, latent    2017   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/2018   Diffuse enlargement on [place] 6/20    Past Surgical History:  Procedure Laterality Date   TONSILLECTOMY  2012   WISDOM TOOTH EXTRACTION       Current Outpatient Medications:    albuterol (VENTOLIN HFA) 108 (90 Base) MCG/ACT inhaler, Inhale 2 puffs into the lungs every 6 (six) hours as needed for wheezing or shortness of breath., Disp: 1 each,  Rfl: 3   benzonatate (TESSALON PERLES) 100 MG capsule, Take 1 capsule (100 mg total) by mouth 3 (three) times daily as needed., Disp: 20 capsule, Rfl: 0   Ascorbic Acid (VITAMIN C) 1000 MG tablet, Take 1,000 mg by mouth daily., Disp: , Rfl:    fluticasone (FLONASE) 50 MCG/ACT nasal spray, Place 2 sprays into both nostrils daily., Disp: 16 g, Rfl: 2   fluticasone (FLOVENT HFA) 44 MCG/ACT inhaler, Inhale 2 puffs into the lungs in the morning and at bedtime., Disp: 1 each, Rfl: 0   Multiple Vitamin (MULTIVITAMIN) tablet, Take 1 tablet by mouth daily., Disp: , Rfl:    omeprazole (PRILOSEC) 40 MG capsule, Take 1 capsule (40 mg total) by mouth 2 (two) times daily., Disp: 60 capsule, Rfl: 3  EXAM:  VITALS per patient if applicable:  GENERAL: alert, oriented, appears well and in no acute distress  HEENT: atraumatic, conjunttiva clear, no obvious abnormalities on inspection of external nose and ears  NECK: normal movements of the head and neck  LUNGS: on inspection no signs of respiratory distress, breathing rate appears normal, no obvious gross SOB, gasping or wheezing  CV: no obvious cyanosis  MS: moves all visible extremities without noticeable abnormality  PSYCH/NEURO: pleasant and cooperative, no obvious depression or anxiety, speech and thought processing grossly intact  ASSESSMENT AND PLAN:  Discussed the following assessment and plan:  COVID-19   Discussed treatment options, ideal treatment window, potential complications, isolation and  precautions for COVID-19.  After lengthy discussion, the patient declined referral for Covid outpatient treatment at this time. She feels symptoms are milder than when she got the covid vaccines. The patient did want a prescription for cough, Tessalon Rx sent.  She also requested a refill on her albuterol in case it is needed. Other symptomatic care measures summarized in patient instructions. Work/School slipped offered: provided in patient  instructions   Advised to seek prompt in person care if worsening, new symptoms arise, or if is not improving with treatment. Discussed options for inperson care if PCP office not available. Did let this patient know that I only do telemedicine on Tuesdays and Thursdays for [place]. Advised to schedule follow up visit with PCP or [facility] if any further questions or concerns to avoid delays in care.   I discussed the assessment and treatment plan with the patient. The patient was provided an opportunity to ask questions and all were answered. The patient agreed with the plan and demonstrated an understanding of the instructions.     Terressa Koyanagi, DO

## 2020-12-22 ENCOUNTER — Ambulatory Visit: Payer: No Typology Code available for payment source | Admitting: Family Medicine

## 2020-12-23 ENCOUNTER — Other Ambulatory Visit (HOSPITAL_COMMUNITY): Payer: Self-pay

## 2021-01-05 ENCOUNTER — Ambulatory Visit: Payer: No Typology Code available for payment source | Admitting: Family Medicine

## 2021-01-15 ENCOUNTER — Other Ambulatory Visit: Payer: Self-pay

## 2021-01-15 ENCOUNTER — Ambulatory Visit
Admission: RE | Admit: 2021-01-15 | Discharge: 2021-01-15 | Disposition: A | Discharge status: Home / Self Care | Payer: No Typology Code available for payment source | Source: Ambulatory Visit | Attending: Family Medicine | Admitting: Family Medicine

## 2021-01-15 VITALS — BP 130/82 | HR 66 | Temp 98.6°F | Resp 18

## 2021-01-15 DIAGNOSIS — H6983 Other specified disorders of Eustachian tube, bilateral: Secondary | ICD-10-CM

## 2021-01-15 DIAGNOSIS — R0981 Nasal congestion: Secondary | ICD-10-CM

## 2021-01-15 DIAGNOSIS — H6993 Unspecified Eustachian tube disorder, bilateral: Secondary | ICD-10-CM

## 2021-01-15 MED ORDER — PREDNISONE 20 MG PO TABS: 20.0000 mg | ORAL_TABLET | Freq: Every day | ORAL | 0 refills | Status: AC

## 2021-01-15 MED ORDER — FLUTICASONE PROPIONATE 50 MCG/ACT NA SUSP: 2.0000 | Freq: Every day | NASAL | 0 refills | Status: DC

## 2021-01-15 NOTE — ED Provider Notes (Signed)
Maria Smith    CSN: [id number] Arrival date & time: 01/15/21  1016      History   Chief Complaint Chief Complaint  Patient presents with   Otalgia    HPI Maria Smith is a 25 y.o. female.   HPI Patient with a history of COVID over a month and a half ago presents today with bilateral ear pain x2 days.  Patient endorses wearing of ear pods however does not wear them continuously.  She reports upon awakening after falling asleep with ear pods in her ears, she noticed that her hearing was muffled and that she experienced pain in bilateral inner ears.  She endorses some mild congestion although is not having any sinus headache or postnasal drainage.  Patient has no fever.  Patient has a history of allergic rhinitis and asthma.   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 Rifampin since 08/2015   Streptococcal infection(041.00)    Age 65 and 25 years old   TB lung, latent    2017   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/2018   Diffuse enlargement on [place] 6/20    Patient Active Problem List   Diagnosis Date Noted   Left-sided chest wall pain 03/23/2020   Positive ANA (antinuclear antibody) 03/23/2020   Asthma 02/10/2020   Allergic rhinitis 02/10/2020   GERD (gastroesophageal reflux disease) 02/10/2020   History of TB (tuberculosis) 08/21/2019   Throat pain 01/28/2019   Odynophagia 01/28/2019   Neck pain 01/28/2019   Thyroiditis 01/12/2019   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/2018   Fatty liver 12/11/2018   PCOS (polycystic ovarian syndrome) 11/11/2018   Seasonal allergies 09/22/2018   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   Migraine with aura and without status migrainosus, not intractable 09/11/2012    Variants of migraine, not elsewhere classified, without mention of intractable migraine without mention of status migrainosus 09/11/2012   Unspecified constipation 09/11/2012   Syncope 08/31/2010   Neck Pain 08/31/2010   Insomnia 08/31/2010    Past Surgical History:  Procedure Laterality Date   TONSILLECTOMY  2012   WISDOM TOOTH EXTRACTION      OB History     Gravida  0   Para  0   Term  0   Preterm  0   AB  0   Living  0      SAB  0   IAB  0   Ectopic  0   Multiple  0   Live Births  0            Home Medications    Prior to Admission medications   Medication Sig Start Date End Date Taking? Authorizing Provider  fluticasone (FLONASE) 50 MCG/ACT nasal spray Place 2 sprays into both nostrils daily. 01/15/21  Yes Bing Neighbors, FNP  predniSONE (DELTASONE) 20 MG tablet Take 1 tablet (20 mg total) by mouth daily with breakfast for 5 days. 01/15/21 01/20/21 Yes Bing Neighbors, FNP  albuterol (VENTOLIN HFA) 108 (90 Base) MCG/ACT inhaler Inhale 2 puffs into the lungs every 6 (six) hours as needed for wheezing or  shortness of breath. 12/15/20   Terressa Koyanagi, DO  Ascorbic Acid (VITAMIN C) 1000 MG tablet Take 1,000 mg by mouth daily.    [provider]  benzonatate (TESSALON PERLES) 100 MG capsule Take 1 capsule (100 mg total) by mouth 3 (three) times daily as needed. 12/15/20   Terressa Koyanagi, DO  fluticasone (FLOVENT HFA) 44 MCG/ACT inhaler Inhale 2 puffs into the lungs in the morning and at bedtime. 06/20/20   Terressa Koyanagi, DO  Multiple Vitamin (MULTIVITAMIN) tablet Take 1 tablet by mouth daily.    [provider]  omeprazole (PRILOSEC) 40 MG capsule Take 1 capsule (40 mg total) by mouth 2 (two) times daily. 07/03/19   Deeann Saint, MD  levocetirizine (XYZAL) 5 MG tablet TAKE 1 TABLET BY MOUTH EVERY DAY IN THE EVENING Patient not taking: No sig reported 04/15/19 10/23/19  Deeann Saint, MD    Family History Family History  Problem  Relation Age of Onset   Migraines Paternal Grandmother    Migraines Paternal Grandfather    HIV Father    Diabetes Mother    Hypertension Mother    Anemia Mother    COPD Mother    CVA Mother    Congestive Heart Failure Mother    Cirrhosis Mother    Obesity Mother    Irritable bowel syndrome Mother    Rheum arthritis Mother    Ulcers Brother    Anemia Sister    Cholecystitis Brother    Migraines Paternal Aunt    Migraines Maternal Uncle        Maternal Great Uncle   Diabetes Other        Family History   Hypertension Other        Family History   Depression Other        Family History   Asthma Other        Family History   Allergic rhinitis Other        Family History   Asthma Brother    Colon cancer Neg Hx    Colon polyps Neg Hx    Esophageal cancer Neg Hx    Rectal cancer Neg Hx    Stomach cancer Neg Hx     Social History Social History   Tobacco Use   Smoking status: Never   Smokeless tobacco: Never  Vaping Use   Vaping Use: Never used  Substance Use Topics   Alcohol use: Not Currently   Drug use: No     Allergies   Patient has no known allergies.   Review of Systems Review of Systems Pertinent negatives listed in HPI  Physical Exam Triage Vital Signs ED Triage Vitals [01/15/21 1025]  Enc Vitals Group     BP 130/82     Pulse Rate 66     Resp 18     Temp 98.6 F (37 C)     Temp Source Oral     SpO2 99 %     Weight      Height      Head Circumference      Peak Flow      Pain Score      Pain Loc      Pain Edu?      Excl. in GC?    No data found.  Updated Vital Signs BP 130/82 (BP Location: Left Arm)   Pulse 66   Temp 98.6 F (37 C) (Oral)   Resp 18   LMP 12/13/2020  SpO2 99%   Visual Acuity Right Eye Distance:   Left Eye Distance:   Bilateral Distance:    Right Eye Near:   Left Eye Near:    Bilateral Near:     Physical Exam General Appearance:    Alert, cooperative, no distress  HENT:   ENT exam normal, no neck  nodes, nasal mucosa edema and congested, and oropharynx WNL   Eyes:    PERRL, conjunctiva/corneas clear, EOM's intact       Lungs:     Clear to auscultation bilaterally, respirations unlabored  Heart:    Regular rate and rhythm  Neurologic:   Awake, alert, oriented x 3. No apparent focal neurological deficit        UC Treatments / Results  Labs (all labs ordered are listed, but only abnormal results are displayed) Labs Reviewed - No data to display  EKG   Radiology No results found.  Procedures Procedures (including critical care time)  Medications Ordered in UC Medications - No data to display  Initial Impression / Assessment and Plan / UC Course  I have reviewed the triage vital signs and the nursing notes.  Pertinent labs & imaging results that were available during my care of the patient were reviewed by me and considered in my medical decision making (see chart for details).     Eustachian tube dysfunction bilaterally secondary to sinus congestion.  Trial treatment with prednisone 20 mg once daily for 5 days and Flonase nasal spray.  Symptoms are typically self-limiting and are controlled with management of sinus symptoms.  Return precautions given if symptoms worsen or do not improve. Final Clinical Impressions(s) / UC Diagnoses   Final diagnoses:  Eustachian tube dysfunction, bilateral  Sinus congestion   Discharge Instructions   None    ED Prescriptions     Medication Sig Dispense Auth. Provider   predniSONE (DELTASONE) 20 MG tablet Take 1 tablet (20 mg total) by mouth daily with breakfast for 5 days. 5 tablet Bing Neighbors, FNP   fluticasone (FLONASE) 50 MCG/ACT nasal spray Place 2 sprays into both nostrils daily. 16 g Bing Neighbors, FNP      PDMP not reviewed this encounter.   Bing Neighbors, FNP 01/15/21 1047

## 2021-01-15 NOTE — ED Triage Notes (Signed)
Pt presents with bilateral ear pain for past 2 weeks

## 2021-01-26 NOTE — Progress Notes (Signed)
Office Visit Note  Patient: Maria Smith             Date of Birth: 04/23/1996           MRN: [id number]             PCP: Billie Ruddy, MD Referring: Billie Ruddy, MD Visit Date: 01/27/2021   Subjective:  Follow-up (Left hip joint pain, Dr. Junius Roads test ANA - please see results in EPIC)   History of Present Illness: Maria Smith is a 25 y.o. female here for follow up for continued symptoms including fatigue, joint pains, bruising, raynaud's, and sleep difficulty ongoing since our visit last year with some increase in left hip pain more recently. Lab tests with orthopedics clinic redemonstrates positive ANA titer similar as previous workup. She describes prolonged sleep latency and as a result sleeping less than 4 hours continuously most nights. She is tired all day but does not take naps. Left hip pain more noticeable lying on this side but also with prolonged standing. She does not notice swelling in joints but has Smith spontaneous bruising discoloration on her thigh with no sensation changes.   Previous HPI: 03/23/20 Maria Smith is a 26 y.o. female referred for positive ANA with chronic left sided chest wall pain. She has been experiencing chest pain since about a year ago that she has on most days. She feels this over her sternum, anterior chest, lateral chest, and above the scapula all limited to the left side. This is exacerbated with movement such as reaching overhead and across the body and when lying down. She does feel increased pain with taking deep breaths. She describes this as a "dull pinching" type of pain. She was recently referred to pulmonology for her mild persistent asthma symptoms but denies any chronic coughing or daily respiratory complaints. 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   She has been evaluated on several occasions for this including EKG, CT  angiography, and chest x-rays which have all been unremarkable for an underlying cause. She previously had a negative ANA screen but repeat evaluation in the past 69-monthshowed weakly positive ANA but normal complements, inflammatory markers, and negative ENA panel.   Labs reviewed include the following 01/2020 ANA 1:80 nuclear, speckled C3,C4 normal Ds DNA negative Ehrlich Ab negative SSA negative SSB negative TPO Ab negative Scl-70 negative RF negative TSH 1.23 CBC normal CMP normal ESR 2 CRP 0.5  Review of Systems  Constitutional:  Positive for fatigue.  HENT:  Negative for mouth dryness.   Eyes:  Negative for dryness.  Respiratory:  Negative for shortness of breath.   Cardiovascular:  Negative for swelling in legs/feet.  Gastrointestinal:  Positive for constipation.  Endocrine: Positive for heat intolerance.  Genitourinary:  Negative for difficulty urinating.  Musculoskeletal:  Positive for joint pain, joint pain and muscle tenderness.  Skin:  Negative for rash.  Allergic/Immunologic: Negative for susceptible to infections.  Neurological:  Positive for weakness.  Hematological:  Positive for bruising/bleeding tendency.  Psychiatric/Behavioral:  Positive for sleep disturbance.    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   Migraine with aura and without status migrainosus, not intractable 09/11/2012    Variants of migraine, not elsewhere classified, without mention of intractable migraine without mention of status migrainosus 09/11/2012   Unspecified constipation 09/11/2012   Syncope 08/31/2010   Neck Pain 08/31/2010   Insomnia 08/31/2010    Past Medical History:  Diagnosis Date   Allergic rhinitis    Allergy    SEASONAL   Asthma    Eczema    Environmental allergies    Age 73 years   Fatty liver 12/11/2018   Noted on CT 6/20   Generalized headaches    Began at age 68 years   GERD (gastroesophageal reflux disease)    Influenza    Age 89 years   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 Rifampin since 08/2015   Streptococcal infection(041.00)    Age 49 and 25 years old   TB lung, latent    2017   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/2018   Diffuse enlargement on [place] 6/20    Family History  Problem Relation Age of Onset   Migraines Paternal Grandmother    Migraines Paternal Grandfather    HIV Father    Diabetes Mother    Hypertension Mother    Anemia Mother    COPD Mother    CVA Mother    Congestive Heart Failure Mother    Cirrhosis Mother    Obesity Mother    Irritable bowel syndrome Mother    Rheum arthritis Mother    Ulcers Brother    Anemia Sister    Cholecystitis Brother    Migraines Paternal Aunt    Migraines Maternal Uncle        Maternal Great Uncle   Diabetes Other        Family History   Hypertension Other        Family History   Depression Other        Family History   Asthma Other        Family History   Allergic rhinitis Other        Family History   Asthma Brother    Colon cancer Neg Hx    Colon polyps Neg Hx    Esophageal cancer Neg Hx    Rectal cancer Neg Hx    Stomach cancer Neg Hx    Past Surgical History:  Procedure Laterality Date   TONSILLECTOMY  2012   WISDOM TOOTH EXTRACTION     Social History   Social History Narrative   Patient works at a SNF in [employer]   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    Immunization History  Administered Date(s) Administered   Influenza,inj,Quad PF,6+ Mos 05/04/2016   PFIZER(Purple Top)SARS-COV-2 Vaccination 01/29/2020, 02/19/2020, 09/16/2020   Tetanus 06/18/2013     Objective: Vital Signs: BP (!) 89/53 (BP Location: Left Arm, Patient Position: Sitting, Cuff Size: Normal)   Pulse 71   Resp 14   Ht 4' 10"  (1.473 m)   Wt 107 lb 9.6 oz (48.8 kg)   BMI 22.49 kg/m  Physical Exam Cardiovascular:     Rate and Rhythm: Normal rate and regular rhythm.  Pulmonary:     Effort: Pulmonary effort is normal.     Breath sounds: Normal breath sounds.  Skin:    General: Skin is warm and dry.     Findings: No rash.  Neurological:     Mental Status: She is alert.  Psychiatric:        Mood and Affect: Mood normal.     Musculoskeletal Exam:  Shoulders full ROM no tenderness or swelling Elbows full ROM no tenderness or swelling Wrists full ROM no tenderness or swelling Fingers full ROM no tenderness or swelling Left lateral and posterior hip tenderness with normal ROM intact Knees full ROM no tenderness or swelling Ankles full ROM no tenderness or swelling   Investigation: No additional findings.  Imaging: No results found.  Recent Labs: Lab Results  Component Value Date   WBC 3.4 (L) 09/19/2020   HGB 13.2 09/19/2020   PLT 250.0 09/19/2020   NA 138 09/19/2020   K 4.3 09/19/2020   CL 104 09/19/2020   CO2 29 09/19/2020   GLUCOSE 75 09/19/2020   BUN 7 09/19/2020   CREATININE 0.70 09/19/2020   BILITOT 0.2 09/19/2020   ALKPHOS 40 09/19/2020   AST 26 09/19/2020   ALT 18 09/19/2020   PROT 6.8 09/19/2020   ALBUMIN 4.1 09/19/2020   CALCIUM 9.5 09/19/2020   GFRAA >60 02/22/2020    Speciality Comments: No specialty comments available.  Procedures:  No procedures performed Allergies: Patient has no known allergies.   Assessment / Plan:     Visit Diagnoses:  Positive ANA (antinuclear antibody)  Persistent ANA positive a few new symptoms have arisen including apparently spontaneous bruising also continued raynaud's arthralgias and fatigue. Still no very specific criteria Smith. Hypotension cause is unclear but could be contributing somewhat to fatigue although sounds mostly like disrupted sleep. Will obtain AVISE CTD testing for further evaluation since previous ENA panel negative.  Pain in left hip  Left hip pain in lateral and posterior distribution most likely soft tissue inflammation there is no swelling no severe lateral tenderness to indicate bursitis. Lab testing as above.  Bruising  Previous normal INR, normal platelets, no bleeding reported. Checking CTD panel also including APS Abs.  Orders: No orders of the defined types were placed in this encounter.  No orders of the defined types were placed in this encounter.    Follow-Up Instructions: Return in about 3 weeks (around 02/17/2021) for AVISE f/u 3wks.   Collier Salina, MD  Note - This record has been created using [employer].  Chart creation errors have been sought, but may not always  have been located. Such creation errors do not reflect on  the standard of medical care.

## 2021-01-27 ENCOUNTER — Encounter: Payer: Self-pay | Admitting: Internal Medicine

## 2021-01-27 ENCOUNTER — Ambulatory Visit (INDEPENDENT_AMBULATORY_CARE_PROVIDER_SITE_OTHER): Payer: No Typology Code available for payment source | Admitting: Internal Medicine

## 2021-01-27 ENCOUNTER — Other Ambulatory Visit: Payer: Self-pay

## 2021-01-27 VITALS — BP 89/53 | HR 71 | Resp 14 | Ht <= 58 in | Wt 107.6 lb

## 2021-01-27 DIAGNOSIS — T148XXA Other injury of unspecified body region, initial encounter: Secondary | ICD-10-CM | POA: Diagnosis not present

## 2021-01-27 DIAGNOSIS — R768 Other specified abnormal immunological findings in serum: Secondary | ICD-10-CM | POA: Diagnosis not present

## 2021-01-27 DIAGNOSIS — M25552 Pain in left hip: Secondary | ICD-10-CM

## 2021-01-31 ENCOUNTER — Encounter: Payer: Self-pay | Admitting: Internal Medicine

## 2021-02-10 ENCOUNTER — Other Ambulatory Visit: Payer: Self-pay | Admitting: Family Medicine

## 2021-02-21 ENCOUNTER — Telehealth: Payer: Self-pay | Admitting: *Deleted

## 2021-02-21 NOTE — Telephone Encounter (Signed)
Patient contacted the office stating she is going to have to reschedule her appointment she has scheduled on 02/24/2021. Patient is inquiring about her AVISE labs. Please check your folder for labs and advise. Thanks!

## 2021-02-23 NOTE — Telephone Encounter (Signed)
AVISE labs are negative for evidence of lupus specific markers. Of note she did demonstrate anti-throglobulin antibodies, a protein in the thyroid gland but I am not sure about the significance of this since she has normal thyroid function tests in April. We could consider repeating these in more detail to take another look and some other tests such as muscle enzymes. Test does not indicate any particular rheumatology disease causing symptoms currently so no new medicine recommendation at the moment.

## 2021-02-23 NOTE — Telephone Encounter (Signed)
Patient advised AVISE labs are negative for evidence of lupus specific markers. Of note she did demonstrate anti-throglobulin antibodies, a protein in the thyroid gland but Dr. Dimple Casey is not sure about the significance of this since she has normal thyroid function tests in April. Patient advised could consider repeating these in more detail to take another look and some other tests such as muscle enzymes. Test does not indicate any particular rheumatology disease causing symptoms currently so no new medicine recommendation at the moment. Patient expressed understanding and requested we cancel her appointment for tomorrow.

## 2021-02-24 ENCOUNTER — Ambulatory Visit: Payer: No Typology Code available for payment source | Admitting: Internal Medicine

## 2021-03-03 ENCOUNTER — Telehealth (INDEPENDENT_AMBULATORY_CARE_PROVIDER_SITE_OTHER): Payer: No Typology Code available for payment source | Admitting: Family Medicine

## 2021-03-03 ENCOUNTER — Encounter: Payer: Self-pay | Admitting: Family Medicine

## 2021-03-03 DIAGNOSIS — G43809 Other migraine, not intractable, without status migrainosus: Secondary | ICD-10-CM

## 2021-03-03 MED ORDER — RIZATRIPTAN BENZOATE 5 MG PO TABS: 5.0000 mg | ORAL_TABLET | ORAL | 2 refills | Status: DC | PRN

## 2021-03-03 NOTE — Progress Notes (Signed)
Virtual Visit via Video Note  I connected with Maria Smith on 03/03/21 at 11:30 AM EDT by a video enabled telemedicine application 2/2 COVID-19 pandemic and verified that I am speaking with the correct person using two identifiers.  Location patient: home Location provider:work or home office Persons participating in the virtual visit: patient, provider  I discussed the limitations of evaluation and management by telemedicine and the availability of in person appointments. The patient expressed understanding and agreed to proceed.  Chief Complaint  Patient presents with   Migraine    Like a pulling feeling, like a rubber band feeling, has taken Excedrin      HPI: Pt with increased HAs daily since Sunday.  Pain in frontal area and at top of head.  +Light and sound sensitivity.  Tried Excedrin which deceases the intensity, but does no relieve the HA. Unsure if having a HA each day or one prolonged HA.  Denies n/v, changes in vision. Stopped caffeine intake 3 wks ago.  Trying to drink 6 bottles of water per day.  In bed by 9 pm or 10 pm.  H/o migraines/HAs since teenager, previously on amitriptyline.  Does not think she ever started maxalt as HAs improved.  Pt denies increased stress.    ROS: See pertinent positives and negatives per HPI.  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 Rifampin since 08/2015   Streptococcal infection(041.00)    Age 68 and 25 years old   TB lung, latent    2017   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/2018   Diffuse enlargement on [place] 6/20    Past Surgical History:  Procedure Laterality Date   TONSILLECTOMY  2012   WISDOM TOOTH EXTRACTION      Family History   Problem Relation Age of Onset   Migraines Paternal Grandmother    Migraines Paternal Grandfather    HIV Father    Diabetes Mother    Hypertension Mother    Anemia Mother    COPD Mother    CVA Mother    Congestive Heart Failure Mother    Cirrhosis Mother    Obesity Mother    Irritable bowel syndrome Mother    Rheum arthritis Mother    Ulcers Brother    Anemia Sister    Cholecystitis Brother    Migraines Paternal Aunt    Migraines Maternal Uncle        Maternal Great Uncle   Diabetes Other        Family History   Hypertension Other        Family History   Depression Other        Family History   Asthma Other        Family History   Allergic rhinitis Other        Family History   Asthma Brother    Colon cancer Neg Hx    Colon polyps Neg Hx    Esophageal cancer Neg Hx    Rectal cancer Neg Hx    Stomach cancer Neg Hx    Social hx: pt is married.  She and her wife have twins, a boy and girl.  Current Outpatient Medications:    albuterol (VENTOLIN HFA) 108 (90 Base) MCG/ACT inhaler, Inhale 2 puffs into the lungs every 6 (six) hours as needed for wheezing or shortness of breath., Disp: 18 g, Rfl: 3   Ascorbic Acid (VITAMIN C) 1000 MG tablet, Take 1,000 mg by mouth daily., Disp: , Rfl:    fluticasone (FLONASE) 50 MCG/ACT nasal spray, Place 2 sprays into both nostrils daily., Disp: 16 g, Rfl: 0   fluticasone (FLOVENT HFA) 44 MCG/ACT inhaler, Inhale 2 puffs into the lungs in the morning and at bedtime., Disp: 1 each, Rfl: 0   Multiple Vitamin (MULTIVITAMIN) tablet, Take 1 tablet by mouth daily., Disp: , Rfl:    benzonatate (TESSALON PERLES) 100 MG capsule, Take 1 capsule (100 mg total) by mouth 3 (three) times daily as needed. (Patient not taking: Reported on 03/03/2021), Disp: 20 capsule, Rfl: 0   omeprazole (PRILOSEC) 40 MG capsule, Take 1 capsule (40 mg total) by mouth 2 (two) times daily. (Patient not taking: Reported on 03/03/2021), Disp: 60 capsule, Rfl: 3  EXAM:  VITALS  per patient if applicable: RR between 12-20 bpm  GENERAL: alert, oriented, appears well and in no acute distress  HEENT: atraumatic, conjunctiva clear, no obvious abnormalities on inspection of external nose and ears  NECK: normal movements of the head and neck  LUNGS: on inspection no signs of respiratory distress, breathing rate appears normal, no obvious gross SOB, gasping or wheezing  CV: no obvious cyanosis  MS: moves all visible extremities without noticeable abnormality  PSYCH/NEURO: pleasant and cooperative, no obvious depression or anxiety, speech and thought processing grossly intact  ASSESSMENT AND PLAN:  Discussed the following assessment and plan:  Other migraine without status migrainosus, not intractable  -discussed HA prevention including hydration, rest, limiting caffeine and other triggers. -limit NSAID use -consider magnesium citrate 400 mg daily, riboflavin 400 mg daily, and coenzyme Q 10 100 mg 3 times daily -Also discussed stress relief as muscle tension may be contributing factor. -MRI brain 05/21/2019 normal.  Sinuses and orbit not well visualized 2/2 artifact. - Plan: rizatriptan (MAXALT) 5 MG tablet, Ambulatory referral to Neurology  F/u prn   I discussed the assessment and treatment plan with the patient. The patient was provided an opportunity to ask questions and all were answered. The patient agreed with the plan and demonstrated an understanding of the instructions.   The patient was advised to call back or seek an in-person evaluation if the symptoms worsen or if the condition fails to improve as anticipated.  Deeann Saint, MD

## 2021-03-19 ENCOUNTER — Ambulatory Visit
Admission: EM | Admit: 2021-03-19 | Discharge: 2021-03-19 | Disposition: A | Discharge status: Home / Self Care | Payer: No Typology Code available for payment source | Attending: Emergency Medicine | Admitting: Emergency Medicine

## 2021-03-19 ENCOUNTER — Other Ambulatory Visit: Payer: Self-pay

## 2021-03-19 ENCOUNTER — Encounter: Payer: Self-pay | Admitting: Emergency Medicine

## 2021-03-19 ENCOUNTER — Ambulatory Visit (INDEPENDENT_AMBULATORY_CARE_PROVIDER_SITE_OTHER): Payer: No Typology Code available for payment source

## 2021-03-19 DIAGNOSIS — R059 Cough, unspecified: Secondary | ICD-10-CM | POA: Diagnosis not present

## 2021-03-19 DIAGNOSIS — J209 Acute bronchitis, unspecified: Secondary | ICD-10-CM

## 2021-03-19 DIAGNOSIS — R062 Wheezing: Secondary | ICD-10-CM | POA: Diagnosis not present

## 2021-03-19 MED ORDER — PREDNISONE 10 MG PO TABS: ORAL_TABLET | ORAL | 0 refills | Status: AC

## 2021-03-19 NOTE — ED Triage Notes (Signed)
started having symptoms 2 days ago.  Sore throat, head congestion, blood tinge in phlegm.  Right eye is itching and hurts, patient thinks this may be pink eye-her child has pink eye

## 2021-03-19 NOTE — ED Provider Notes (Signed)
CHIEF COMPLAINT:   Chief Complaint  Patient presents with   URI     SUBJECTIVE/HPI:  HPI A very pleasant 25 y.o.Female presents today with sore throat, congestion, blood-tinged sputum which started about 2 days ago.  Patient also reports that the right eye is itching and painful.  Patient reports that she has been exposed to pinkeye as her child was recently diagnosed with this illness. Patient does not report any shortness of breath, chest pain, palpitations, visual changes, weakness, tingling, headache, nausea, vomiting, diarrhea, fever, chills.   has a past medical history of Allergic rhinitis, Allergy, Asthma, Eczema, Environmental allergies, Fatty liver (12/11/2018), Generalized headaches, GERD (gastroesophageal reflux disease), Influenza, Positive PPD (07/2015), Streptococcal infection(041.00), TB lung, latent, and Thyromegaly (12/11/2018).  ROS:  Review of Systems See Subjective/HPI Medications, Allergies and Problem List personally reviewed in Epic today OBJECTIVE:   Vitals:   03/19/21 1414  BP: 110/72  Pulse: 78  Resp: 18  Temp: 98.9 F (37.2 C)  SpO2: 98%    Physical Exam   General: Appears well-developed and well-nourished. No acute distress.  HEENT Head: Normocephalic and atraumatic.  Ears: Hearing grossly intact, no drainage or visible deformity.  Nose: No nasal deviation.   Mouth/Throat: No stridor or tracheal deviation.  Non erythematous posterior pharynx noted with clear drainage present.  No white patchy exudate noted. Eyes: Conjunctivae and EOM are normal. No eye drainage or scleral icterus bilaterally.  Neck: Normal range of motion, neck is supple.  Cardiovascular: Normal rate. Regular rhythm; no murmurs, gallops, or rubs.  Pulm/Chest: No respiratory distress.  Diffuse rhonchi and inspiratory wheezing noted over bilateral upper and lower lung fields.  Rhonchi somewhat clears with cough. Neurological: Alert and oriented to person, place, and time.  Skin: Skin  is warm and dry.  No rashes, lesions, abrasions or bruising noted to skin.   Psychiatric: Normal mood, affect, behavior, and thought content.   Vital signs and nursing note reviewed.   Patient stable and cooperative with examination. PROCEDURES:    LABS/X-RAYS/EKG/MEDS:   No results found for any visits on 03/19/21.  MEDICAL DECISION MAKING:   Patient presents with sore throat, congestion, blood-tinged sputum which started about 2 days ago.  Patient also reports that the right eye is itching and painful.  Patient reports that she has been exposed to pinkeye as her child was recently diagnosed with this illness. Patient does not report any shortness of breath, chest pain, palpitations, visual changes, weakness, tingling, headache, nausea, vomiting, diarrhea, fever, chills.  Chest x-ray reveals no pneumonia or pneumothorax, as read by me.  Overread pending.  Rx'd prednisone to the patient's preferred pharmacy and advised about home treatment and care to include rest, fluids, Tylenol versus ibuprofen.  The patient does have a history of asthma and has access to an albuterol inhaler for which I advised use of as needed.  She may also use Mucinex for cough over-the-counter.  Normal course of bronchitis 1 to 3 weeks.  Return to clinic for new onset fever, difficulty breathing, chest pain, symptoms lasting longer than 3 to 4 weeks or worsening bloody sputum.  Return as needed.  Patient verbalized understanding and agreed with treatment plan.  Patient stable upon discharge. ASSESSMENT/PLAN:  1. Acute bronchitis, unspecified organism - predniSONE (DELTASONE) 10 MG tablet; Take 6 tablets (60 mg total) by mouth daily for 1 day, THEN 5 tablets (50 mg total) daily for 1 day, THEN 4 tablets (40 mg total) daily for 1 day, THEN 3 tablets (30 mg  total) daily for 1 day, THEN 2 tablets (20 mg total) daily for 1 day, THEN 1 tablet (10 mg total) daily for 1 day.  Dispense: 21 tablet; Refill: 0 Instructions about new  medications and side effects provided.  Plan:   Discharge Instructions      Take prednisone as prescribed.  The majority of cases of acute bronchitis are caused by infection with respiratory viruses that do not require antibiotics.   Rest, push lots of fluids (especially water), and utilize supportive care for symptoms. You may take take acetaminophen (Tylenol) every 4-6 hours or ibuprofen every 6-8 hours for muscle pain, joint pain, headaches. Mucinex (guaifenesin) may be taken over the counter for cough as needed and can loosen phlegm. Please read the instructions and take as directed. Saline nasal sprays to rinse congestion can help as well. Warm tea with lemon and honey can sooth sore throat and cough, as can cough drops.  Take Mucinex for cough.  The normal course of bronchitis is 1-3 weeks. Return to clinic for new-onset fever, difficulty breathing, chest pain, symptoms lasting >3 to 4 weeks, or bloody sputum.          [name], FNP 03/19/21 1520

## 2021-03-19 NOTE — Discharge Instructions (Addendum)
Take prednisone as prescribed.  The majority of cases of acute bronchitis are caused by infection with respiratory viruses that do not require antibiotics.   Rest, push lots of fluids (especially water), and utilize supportive care for symptoms. You may take take acetaminophen (Tylenol) every 4-6 hours or ibuprofen every 6-8 hours for muscle pain, joint pain, headaches. Mucinex (guaifenesin) may be taken over the counter for cough as needed and can loosen phlegm. Please read the instructions and take as directed. Saline nasal sprays to rinse congestion can help as well. Warm tea with lemon and honey can sooth sore throat and cough, as can cough drops.  Take Mucinex for cough.  The normal course of bronchitis is 1-3 weeks. Return to clinic for new-onset fever, difficulty breathing, chest pain, symptoms lasting >3 to 4 weeks, or bloody sputum.

## 2021-03-27 ENCOUNTER — Encounter: Payer: Self-pay | Admitting: Psychiatry

## 2021-03-27 ENCOUNTER — Ambulatory Visit: Payer: No Typology Code available for payment source | Admitting: Psychiatry

## 2021-03-27 VITALS — BP 109/68 | HR 76 | Ht 59.0 in | Wt 114.0 lb

## 2021-03-27 DIAGNOSIS — M542 Cervicalgia: Secondary | ICD-10-CM | POA: Diagnosis not present

## 2021-03-27 DIAGNOSIS — G43009 Migraine without aura, not intractable, without status migrainosus: Secondary | ICD-10-CM | POA: Diagnosis not present

## 2021-03-27 NOTE — Progress Notes (Signed)
Referring:  Deeann Saint, MD [address] [place],  [place] [address]  PCP: Deeann Saint, MD  Neurology was asked to evaluate Clova Morlock, a 25 year old female for a chief complaint of headaches.  Our recommendations of care will be communicated by shared medical record.    CC:  headaches  HPI:  Medical co-morbidities: asthma  The patient presents for evaluation of migraines which she's had since she was 68-76 years old. They have been worsening in frequency over the past 2 years around the time her mother passed away. She currently has ~3 migraines per month. They are described as frontal throbbing with associated photophobia, phonophobia, and nausea. Takes Excedrin as needed for migraines which helps sometimes, but she will occasionally have severe headaches where nothing helps. She was given a prescription for Maxalt but hasn't tried it yet.  She has previously tried Topamax and amitriptyline. Notes amitriptyline made her drowsy and does not remember if Topamax was effective.  Headache History: Onset: 17-35 years old Triggers: stress, lack of sleep Most common time of day for headache to begin: midday Aura: no Location: frontal, vertex Quality/Description: pulling, throbbing Associated Symptoms:  Photophobia: yes  Phonophobia: yes  Nausea: yes Worse with activity?: yes Duration of headaches: 24 hours  Headache days per month: 3 Headache free days per month: 27  Current Treatment: Abortive Excedrin  Preventative none  Prior Therapies                                 Amitriptyline 25 mg QHS topamax  Headache Risk Factors: Headache risk factors and/or co-morbidities (-) Neck Pain (-) Back Pain (-) History of Motor Vehicle Accident (-) History of Traumatic Brain Injury and/or Concussion   LABS: CBC    Component Value Date/Time   WBC 3.4 (L) 09/19/2020 1338   RBC 4.55 09/19/2020 1338   HGB 13.2 09/19/2020 1338   HGB 13.9  06/26/2017 1155   HCT 39.3 09/19/2020 1338   HCT 42.0 06/26/2017 1155   PLT 250.0 09/19/2020 1338   PLT 317 06/26/2017 1155   MCV 86.3 09/19/2020 1338   MCV 86 06/26/2017 1155   MCH 28.8 04/16/2020 0315   MCHC 33.6 09/19/2020 1338   RDW 13.0 09/19/2020 1338   RDW 14.9 06/26/2017 1155   LYMPHSABS 1.1 09/19/2020 1338   MONOABS 0.5 09/19/2020 1338   EOSABS 0.2 09/19/2020 1338   BASOSABS 0.0 09/19/2020 1338   CMP     Component Value Date/Time   NA 138 09/19/2020 1338   NA 140 06/26/2017 1155   K 4.3 09/19/2020 1338   CL 104 09/19/2020 1338   CO2 29 09/19/2020 1338   GLUCOSE 75 09/19/2020 1338   BUN 7 09/19/2020 1338   BUN 10 06/26/2017 1155   CREATININE 0.70 09/19/2020 1338   CREATININE 0.78 02/05/2020 1634   CALCIUM 9.5 09/19/2020 1338   PROT 6.8 09/19/2020 1338   PROT 7.3 06/26/2017 1155   ALBUMIN 4.1 09/19/2020 1338   ALBUMIN 4.6 06/26/2017 1155   AST 26 09/19/2020 1338   ALT 18 09/19/2020 1338   ALKPHOS 40 09/19/2020 1338   BILITOT 0.2 09/19/2020 1338   BILITOT 0.6 06/26/2017 1155   GFRNONAA >60 04/16/2020 0315   GFRNONAA 106 02/05/2020 1634   GFRAA >60 02/22/2020 0805   GFRAA 123 02/05/2020 1634   TSH wnl  11/02/20: ANA 1:80  IMAGING:  MRI brain with contrast 05/2019  unremarkable  Imaging independently reviewed on March 27, 2021   Current Outpatient Medications on File Prior to Visit  Medication Sig Dispense Refill   albuterol (VENTOLIN HFA) 108 (90 Base) MCG/ACT inhaler Inhale 2 puffs into the lungs every 6 (six) hours as needed for wheezing or shortness of breath. 18 g 3   Ascorbic Acid (VITAMIN C) 1000 MG tablet Take 1,000 mg by mouth daily.     fluticasone (FLONASE) 50 MCG/ACT nasal spray Place 2 sprays into both nostrils daily. 16 g 0   fluticasone (FLOVENT HFA) 44 MCG/ACT inhaler Inhale 2 puffs into the lungs in the morning and at bedtime. 1 each 0   Multiple Vitamin (MULTIVITAMIN) tablet Take 1 tablet by mouth daily.     omeprazole (PRILOSEC) 40 MG  capsule Take 1 capsule (40 mg total) by mouth 2 (two) times daily. 60 capsule 3   [DISCONTINUED] levocetirizine (XYZAL) 5 MG tablet TAKE 1 TABLET BY MOUTH EVERY DAY IN THE EVENING 30 tablet 0   rizatriptan (MAXALT) 5 MG tablet Take 1 tablet (5 mg total) by mouth as needed for migraine. May repeat in 2 hours if needed (Patient not taking: No sig reported) 10 tablet 2   No current facility-administered medications on file prior to visit.     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 Positive for headaches. All other systems reviewed and negative unless stated otherwise in HPI.   Physical Exam:   Vital Signs: BP 109/68   Pulse 76   Ht 4\' 11"  (1.499 m)   Wt 114 lb (51.7 kg)   LMP 03/01/2021   BMI 23.03 kg/m  GENERAL: well appearing,in no acute distress,alert SKIN:  Color, texture,  turgor normal. No rashes or lesions HEAD:  Normocephalic/atraumatic. CV:  RRR RESP: Normal respiratory effort MSK: +tender to palpation over neck R>L  NEUROLOGICAL: Mental Status: Alert, oriented to person, place and time,Follows commands Cranial Nerves: PERRL,visual fields intact to confrontation,extraocular movements intact,facial sensation intact,no facial droop or ptosis,hearing intact to finger rub bilaterally,no dysarthria,palate elevate symmetrically,tongue protrudes midline,shoulder shrug intact and symmetric Motor: muscle strength 5/5 both upper and lower  extremities,no drift, normal tone Reflexes: 2+ throughout Sensation: intact to light touch all 4 extremities Coordination: Finger-to- nose-finger intact bilaterally,Heel-to-shin intact bilaterally Gait: normal-based   IMPRESSION: 25 year old female with a history of asthma who presents for evaluation of migraines which have increased in frequency over the past 2 years. She would prefer to pursue non-medication options at this time. Discussed lifestyle modifications including healthy diet and sleep. Will start magnesium for headache prevention. Referral to neck PT placed for cervicalgia. Encouraged her to take Maxalt as needed for her migraines.  PLAN: -Preventive: Start magnesium oxide 500 mg daily -Rescue: Maxalt 5 mg PRN (can increase to 10 mg PRN if needed) -Referral to neck PT -next steps: consider CGRP  Headache education was done. Discussed treatment options including preventive and acute medications, natural supplements, and physical therapy. Discussed medication overuse headache and to limit use of acute treatments to no more than 2 days/week or 10 days/month. Discussed medication side effects, adverse reactions and drug interactions. Written educational materials and patient instructions outlining all of the above were given.  Follow-up: 3 months   Ocie Doyne, MD 03/27/2021   8:28 AM

## 2021-03-27 NOTE — Patient Instructions (Signed)
Headache after visit instructions:  RECOMMENDATIONS 1. Start physical therapy for the neck 2. Start magnesium 500 mg daily for headache prevention 3. Take Maxalt at the onset of migraine. If headache recurs or does not fully resolve, you may take a second dose after 2 hours. Please avoid taking more than 2 days per week or 10 days per month.  Follow-up: 3 months  GENERAL HEADACHE INSTRUCTIONS Headache Preventive Treatment: Please keep in mind that it takes 4-6 weeks for the medication to start working well and 2-3 months at the appropriate dose before deciding if it will be useful or not. If it is not helping at all by this time, then we will discuss other medications to try. Supplements may take 3-6 months until you see full effect.   Natural supplements: Magnesium Oxide or Magnesium Glycinate 500 mg at bed (up to 800 mg daily) Coenzyme Q10 300 mg in AM Vitamin B2- 200 mg twice a day  Add 1 supplement at a time since even natural supplements can have undesirable side effects. You can sometimes buy supplements cheaper (especially Coenzyme Q10) at www.WebmailGuide.co.za or at [employer].  Vitamins and herbs that show potential:  Magnesium: Magnesium (250 mg twice a day or 500 mg at bed) has a relaxant effect on smooth muscles such as blood vessels. Individuals suffering from frequent or daily headache usually have low magnesium levels which can be increase with daily supplementation of 400-750 mg. Three trials found 40-90% average headache reduction  when used as a preventative. Magnesium also demonstrated the benefit in menstrually related migraine.  Magnesium is part of the messenger system in the serotonin cascade and it is a good muscle relaxant.  It is also useful for constipation which can be a side effect of other medications used to treat migraine. Good sources include nuts, whole grains, and tomatoes. Side Effects: loose stool/diarrhea Riboflavin (vitamin B 2) 200 mg twice a day. This vitamin  assists nerve cells in the production of ATP a principal energy storing molecule.  It is necessary for many chemical reactions in the body.  There have been at least 3 clinical trials of riboflavin using 400 mg per day all of which suggested that migraine frequency can be decreased.  All 3 trials showed significant improvement in over half of migraine sufferers.  The supplement is found in bread, cereal, milk, meat, and poultry.  Most Americans get more riboflavin than the recommended daily allowance, however riboflavin deficiency is not necessary for the supplements to help prevent headache. Side effects: energizing, green urine  Coenzyme Q10: This is present in almost all cells in the body and is critical component for the conversion of energy.  Recent studies have shown that a nutritional supplement of CoQ10 can reduce the frequency of migraine attacks by improving the energy production of cells as with riboflavin.  Doses of 150 mg twice a day have been shown to be effective.  Melatonin: Increasing evidence shows correlation between melatonin secretion and headache conditions.  Melatonin supplementation has decreased headache intensity and duration.  It is widely used as a sleep aid.  Sleep is natures way of dealing with migraine.  A dose of 3 mg is recommended to start for headaches including cluster headache. Higher doses up to 15 mg has been reviewed for use in Cluster headache and have been used. The rationale behind using melatonin for cluster is that many theories regarding the cause of Cluster headache center around the disruption of the normal circadian rhythm in the brain.  This helps restore the normal circadian rhythm.  Ginger: Ginger has a small amount of antihistamine and anti-inflammatory action which may help headache.  It is primarily used for nausea and may aid in the absorption of other medications. HEADACHE DIET: Foods and beverages which may trigger migraine Note that only 20% of  headache patients are food sensitive. You will know if you are food sensitive if you get a headache consistently 20 minutes to 2 hours after eating a certain food. Only cut out a food if it causes headaches, otherwise you might remove foods you enjoy! What matters most for diet is to eat a well balanced healthy diet full of vegetables and low fat protein, and to not miss meals.  Chocolate, other sweets ALL cheeses except cottage and cream cheese Dairy products, yogurt, sour cream, ice cream Liver Meat extracts (Bovril, Marmite, meat tenderizers) Meats or fish which have undergone aging, fermenting, pickling or smoking. These include: Hotdogs,salami,Lox,sausage, mortadellas,smoked salmon, pepperoni, Pickled herring Pods of broad bean (English beans, Chinese pea pods, [place] (fava) beans, lima and navy beans Ripe avocado, ripe banana Yeast extracts or active yeast preparations such as Brewer's or Fleishman's (commercial bakes goods are permitted) Tomato based foods, pizza (lasagna, etc.)  MSG (monosodium glutamate) is disguised as many things; look for these common aliases: Monopotassium glutamate Autolysed yeast Hydrolysed protein Sodium caseinate "flavorings" "all natural preservatives" Nutrasweet  Avoid all other foods that convincingly provoke headaches.  Resources: The Dizzy [place] Laundry Your Headache Diet, migrainestrong.com  https://zamora-andrews.com/  Caffeine and Migraine For patients that have migraine, caffeine intake more than 3 days per week can lead to dependency and increased migraine frequency. I would recommend cutting back on your caffeine intake as best you can. The recommended amount of caffeine is 200-300 mg daily, although migraine patients may experience dependency at even lower doses. While you may notice an increase in headache temporarily, cutting back will be helpful for headaches in the long run. For more information  on caffeine and migraine, visit: https://americanmigrainefoundation.org/resource-library/caffeine-and-migraine/  Headache Prevention Strategies:  1. Maintain a headache diary; learn to identify and avoid triggers.  - This can be a simple note where you log when you had a headache, associated symptoms, and medications used - There are several smartphone apps developed to help track migraines: Migraine Buddy, Migraine Monitor, Curelator N1-Headache App  Common triggers include: Emotional triggers: Emotional/Upset family or friends Emotional/Upset occupation Business reversal/success Anticipation anxiety Crisis-serious Post-crisis periodNew job/position   Physical triggers: Vacation Day Weekend Strenuous Exercise High Altitude Location New Move Menstrual Day Physical Illness Oversleep/Not enough sleep Weather changes Light: Photophobia or light sesnitivity treatment involves a balance between desensitization and reduction in overly strong input. Use dark polarized glasses outside, but not inside. Avoid bright or fluorescent light, but do not dim environment to the point that going into a normally lit room hurts. Consider FL-41 tint lenses, which reduce the most irritating wavelengths without blocking too much light.  These can be obtained at axonoptics.com or theraspecs.com Foods: see list above.  2. Limit use of acute treatments (over-the-counter medications, triptans, etc.) to no more than 2 days per week or 10 days per month to prevent medication overuse headache (rebound headache).    3. Follow a regular schedule (including weekends and holidays): Don't skip meals. Eat a balanced diet. 8 hours of sleep nightly. Minimize stress. Exercise 30 minutes per day. Being overweight is associated with a 5 times increased risk of chronic migraine. Keep well hydrated and drink 6-8 glasses of  water per day.  4. Initiate non-pharmacologic measures at the earliest onset of your  headache. Rest and quiet environment. Relax and reduce stress. Breathe2Relax is a free app that can instruct you on    some simple relaxtion and breathing techniques. Http://Dawnbuse.com is a    free website that provides teaching videos on relaxation.  Also, there are  many apps that   can be downloaded for "mindful" relaxation.  An app called YOGA NIDRA will help walk you through mindfulness. Another app called Calm can be downloaded to give you a structured mindfulness guide with daily reminders and skill development. Headspace for guided meditation Mindfulness Based Stress Reduction Online Course: www.palousemindfulness.com Cold compresses.  5. Don't wait!! Take the maximum allowable dosage of prescribed medication at the first sign of migraine.  6. Compliance:  Take prescribed medication regularly as directed and at the first sign of a migraine.  7. Communicate:  Call your physician when problems arise, especially if your headaches change, increase in frequency/severity, or become associated with neurological symptoms (weakness, numbness, slurred speech, etc.).  8. Headache/pain management therapies: Consider various complementary methods, including medication, behavioral therapy, psychological counselling, biofeedback, massage therapy, acupuncture, dry needling, and other modalities.  Such measures may reduce the need for medications. Counseling for pain management, where patients learn to function and ignore/minimize their pain, seems to work very well.  9. Recommend changing family's attention and focus away from patient's headaches. Instead, emphasize daily activities. If first question of day is 'How are your headaches/Do you have a headache today?', then patient will constantly think about headaches, thus making them worse. Goal is to re-direct attention away from headaches, toward daily activities and other distractions.  10. Helpful  Websites: www.AmericanHeadacheSociety.org PatentHood.ch www.headaches.org TightMarket.nl www.achenet.org  11. HEADACHE EXPECTATIONS: There are many types of headaches, and only a rare few in which complete relief can be expected.  In general, there is no cure for headache, especially migraine based headaches.  There is nothing available that completely prevents headaches from occurring, breaking through, or having periodic flare-ups and fluctuations.  Regardless of what you are using on a daily basis for prevention, episodic headaches should still be expected, and periods where frequency may escalate and fluctuate are unavoidable.   There is no quick fix for most headaches.  Furthermore, the longer you have had high frequency headaches (such as chronic daily headache), the longer it will likely take to expect any improvement.  In fact, some people will never improve, regardless of how many medications or other treatments we try.  Our treatment strategy is to evaluate for possible causes of your headache, although testing is usually always normal, even in cases of daily continuous headaches for years.  Most types of headache such as migraine are electrical brain disorders (similar to how epilepsy is an electrical brain disorders).  Therefore, there is no testing that will reveal this "dysfunctional electrical circuitry" such on MRI, or other testing.  We try to find a medication that may help lessen the frequency and/or severity of your headaches.  The goal is not to completely stop them from happening, although if that happens, great!  Different people respond to different medications, and some people just don't respond to anything, so it's usually a matter of trying different options.  We cannot predict if or when exactly you will respond to a treatment that we provide.   Preventive headache medications take 4-6 weeks to start working, and 2-3 months to see full effect, assuming you reach  an effective dose.  Therefore, calling or messaging frequently because you have a headache flare prior to the 3 month mark is unlikely to change anything, and unfortunately there is nothing available that will expedite this, so please try to avoid this.  Our recommendation will generally be to give it adequate time first.  If you are unable to wait it out for medications to work, we can also try IV infusions for some temporary relief.     In general, the best that preventive medications or other treatments (including Botox) are able to offer in migraine management (variable in other headache types) is a 50% improvement in frequency and/or severity of headache.  That is our goal, and any additional benefit is considered a bonus.  Some people do significantly better than this, others do not get close to this.  Therefore, if your headaches are not improving by at least 3 months on your preventive strategy, contact us and we can discuss further adjustments.  Keep in mind that complete headache cure is not a realistic expectation.  Refills: Please pay attention to when your refills will need to be renewed. Due to the volume of phone calls daily, this could potentially take a few days, although we certainly try to honor your refill requests as soon as we can.  You should call at least 1 week in advance of needing a refill to ensure you do not run out of medication.  Keep in mind that refill requests on Fridays may not be filled until the following week.

## 2021-04-02 ENCOUNTER — Other Ambulatory Visit: Payer: Self-pay

## 2021-04-02 ENCOUNTER — Ambulatory Visit
Admission: RE | Admit: 2021-04-02 | Discharge: 2021-04-02 | Disposition: A | Discharge status: Home / Self Care | Payer: No Typology Code available for payment source | Source: Ambulatory Visit | Attending: Physician Assistant | Admitting: Physician Assistant

## 2021-04-02 VITALS — BP 121/86 | HR 84 | Temp 98.6°F | Resp 18

## 2021-04-02 DIAGNOSIS — H9203 Otalgia, bilateral: Secondary | ICD-10-CM | POA: Diagnosis not present

## 2021-04-02 MED ORDER — CEPHALEXIN 250 MG/5ML PO SUSR: 500.0000 mg | Freq: Four times a day (QID) | ORAL | 0 refills | Status: AC

## 2021-04-02 NOTE — ED Provider Notes (Signed)
[facility]    CSN: [id number] Arrival date & time: 04/02/21  0956      History   Chief Complaint Chief Complaint  Patient presents with   Otalgia    APPT 1000    HPI Maria Smith is a 25 y.o. female.   Patient here today for evaluation of bilateral ear pain this been ongoing for 4 days.  Her daughter is here with similar symptoms.  She denies any other symptoms, but does states she did have some sore throat that has resolved.  She denies cough.  She has not taken any medication for symptoms.  The history is provided by the patient.  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 Rifampin since 08/2015   Streptococcal infection(041.00)    Age 32 and 25 years old   TB lung, latent    2017   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/2018   Diffuse enlargement on [place] 6/20    Patient Active Problem List   Diagnosis Date Noted   Pain in left hip 01/27/2021   Bruising 01/27/2021   Left-sided chest wall pain 03/23/2020   Positive ANA (antinuclear antibody) 03/23/2020   Asthma 02/10/2020   Allergic rhinitis 02/10/2020   GERD (gastroesophageal reflux disease) 02/10/2020   History of TB (tuberculosis) 08/21/2019   Throat pain 01/28/2019   Odynophagia 01/28/2019   Neck pain 01/28/2019   Thyroiditis 01/12/2019   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/2018   Fatty liver 12/11/2018   PCOS (polycystic ovarian syndrome) 11/11/2018   Seasonal allergies 09/22/2018   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   Migraine with aura and without status migrainosus, not intractable 09/11/2012    Variants of migraine, not elsewhere classified, without mention of intractable migraine without mention of status migrainosus 09/11/2012   Unspecified constipation 09/11/2012   Syncope 08/31/2010   Neck Pain 08/31/2010   Insomnia 08/31/2010    Past Surgical History:  Procedure Laterality Date   TONSILLECTOMY  2012   WISDOM TOOTH EXTRACTION      OB History     Gravida  1   Para  0   Term  0   Preterm  0   AB  0   Living  0      SAB  0   IAB  0   Ectopic  0   Multiple  0   Live Births  0            Home Medications    Prior to Admission medications   Medication Sig Start Date End Date Taking? Authorizing Provider  albuterol (VENTOLIN HFA) 108 (90 Base) MCG/ACT inhaler Inhale 2 puffs into the lungs every 6 (six) hours as needed for wheezing or shortness of breath. 12/15/20  Yes Terressa Koyanagi, DO  cephALEXin (KEFLEX) 250 MG/5ML suspension Take 10 mLs (500 mg total) by mouth 4 (four) times daily for 7 days. 04/02/21 04/09/21 Yes Tomi Bamberger, PA-C  fluticasone (FLONASE) 50 MCG/ACT nasal  spray Place 2 sprays into both nostrils daily. 01/15/21  Yes Bing Neighbors, FNP  fluticasone (FLOVENT HFA) 44 MCG/ACT inhaler Inhale 2 puffs into the lungs in the morning and at bedtime. 06/20/20  Yes Terressa Koyanagi, DO  omeprazole (PRILOSEC) 40 MG capsule Take 1 capsule (40 mg total) by mouth 2 (two) times daily. 07/03/19  Yes Deeann Saint, MD  rizatriptan (MAXALT) 5 MG tablet Take 1 tablet (5 mg total) by mouth as needed for migraine. May repeat in 2 hours if needed 03/03/21  Yes Deeann Saint, MD  Ascorbic Acid (VITAMIN C) 1000 MG tablet Take 1,000 mg by mouth daily.    [provider]  Multiple Vitamin (MULTIVITAMIN) tablet Take 1 tablet by mouth daily.    [provider]  levocetirizine (XYZAL) 5 MG tablet TAKE 1 TABLET BY MOUTH EVERY DAY IN THE EVENING 04/15/19 10/23/19  Deeann Saint, MD    Family History Family History  Problem Relation Age  of Onset   Migraines Paternal Grandmother    Migraines Paternal Grandfather    HIV Father    Diabetes Mother    Hypertension Mother    Anemia Mother    COPD Mother    CVA Mother    Congestive Heart Failure Mother    Cirrhosis Mother    Obesity Mother    Irritable bowel syndrome Mother    Rheum arthritis Mother    Ulcers Brother    Anemia Sister    Cholecystitis Brother    Migraines Paternal Aunt    Migraines Maternal Uncle        Maternal Great Uncle   Diabetes Other        Family History   Hypertension Other        Family History   Depression Other        Family History   Asthma Other        Family History   Allergic rhinitis Other        Family History   Asthma Brother    Colon cancer Neg Hx    Colon polyps Neg Hx    Esophageal cancer Neg Hx    Rectal cancer Neg Hx    Stomach cancer Neg Hx     Social History Social History   Tobacco Use   Smoking status: Never   Smokeless tobacco: Never  Vaping Use   Vaping Use: Never used  Substance Use Topics   Alcohol use: Not Currently   Drug use: No     Allergies   Patient has no known allergies.   Review of Systems Review of Systems  Constitutional:  Negative for chills and fever.  HENT:  Positive for ear pain. Negative for congestion, sinus pressure and sore throat.   Eyes:  Negative for discharge and redness.  Respiratory:  Negative for cough, shortness of breath and wheezing.   Gastrointestinal:  Negative for abdominal pain, diarrhea, nausea and vomiting.    Physical Exam Triage Vital Signs ED Triage Vitals [04/02/21 1111]  Enc Vitals Group     BP 121/86     Pulse Rate 84     Resp 18     Temp 98.6 F (37 C)     Temp Source Oral     SpO2 99 %     Weight      Height      Head Circumference      Peak Flow      Pain Score 7  Pain Loc      Pain Edu?      Excl. in GC?    No data found.  Updated Vital Signs BP 121/86 (BP Location: Left Arm)   Pulse 84   Temp 98.6 F (37 C) (Oral)    Resp 18   LMP 03/01/2021   SpO2 99%   Physical Exam Vitals and nursing note reviewed.  Constitutional:      General: She is not in acute distress.    Appearance: Normal appearance. She is not ill-appearing.  HENT:     Head: Normocephalic and atraumatic.     Ears:     Comments: Bilateral Tms bulging    Nose: No congestion.  Eyes:     Conjunctiva/sclera: Conjunctivae normal.  Cardiovascular:     Rate and Rhythm: Normal rate and regular rhythm.     Heart sounds: Normal heart sounds. No murmur heard. Pulmonary:     Effort: Pulmonary effort is normal. No respiratory distress.     Breath sounds: Normal breath sounds. No wheezing, rhonchi or rales.  Skin:    General: Skin is warm and dry.  Neurological:     Mental Status: She is alert.  Psychiatric:        Mood and Affect: Mood normal.        Thought Content: Thought content normal.     UC Treatments / Results  Labs (all labs ordered are listed, but only abnormal results are displayed) Labs Reviewed - No data to display  EKG   Radiology No results found.  Procedures Procedures (including critical care time)  Medications Ordered in UC Medications - No data to display  Initial Impression / Assessment and Plan / UC Course  I have reviewed the triage vital signs and the nursing notes.  Pertinent labs & imaging results that were available during my care of the patient were reviewed by me and considered in my medical decision making (see chart for details).  Keflex prescribed for treatment of possible otitis media, but discussed that TMs were not erythematous.  Encouraged follow-up if symptoms fail to improve or worsen anyway.  Final Clinical Impressions(s) / UC Diagnoses   Final diagnoses:  Otalgia of both ears   Discharge Instructions   None    ED Prescriptions     Medication Sig Dispense Auth. Provider   cephALEXin (KEFLEX) 250 MG/5ML suspension Take 10 mLs (500 mg total) by mouth 4 (four) times daily for 7  days. 260 mL Tomi Bamberger, PA-C      PDMP not reviewed this encounter.   Tomi Bamberger, PA-C 04/02/21 1208

## 2021-04-02 NOTE — ED Triage Notes (Signed)
Pt reports sharp bilateral ear pain x 4 days.  Denies fevers or other s/s.

## 2021-04-11 ENCOUNTER — Other Ambulatory Visit: Payer: Self-pay

## 2021-04-12 ENCOUNTER — Ambulatory Visit: Payer: No Typology Code available for payment source | Admitting: Family Medicine

## 2021-04-12 ENCOUNTER — Other Ambulatory Visit (HOSPITAL_COMMUNITY): Payer: Self-pay

## 2021-04-12 MED ORDER — INFLUENZA VAC SPLIT QUAD 0.5 ML IM SUSY
0.5000 mL | PREFILLED_SYRINGE | INTRAMUSCULAR | 0 refills | Status: DC
  Filled 2021-04-12: qty 0.5, 1d supply, fill #0

## 2021-04-14 ENCOUNTER — Ambulatory Visit: Payer: No Typology Code available for payment source | Attending: Psychiatry

## 2021-04-14 ENCOUNTER — Other Ambulatory Visit: Payer: Self-pay

## 2021-04-14 DIAGNOSIS — G4486 Cervicogenic headache: Secondary | ICD-10-CM | POA: Diagnosis not present

## 2021-04-14 DIAGNOSIS — M542 Cervicalgia: Secondary | ICD-10-CM | POA: Insufficient documentation

## 2021-04-14 DIAGNOSIS — M546 Pain in thoracic spine: Secondary | ICD-10-CM | POA: Insufficient documentation

## 2021-04-14 NOTE — Therapy (Signed)
OUTPATIENT PHYSICAL THERAPY CERVICAL EVALUATION   Patient Name: Maria Smith MRN: [id number] DOB:1996-01-17, 25 y.o., female Today's Date: 04/14/2021   PT End of Session - 04/14/21 1225     Visit Number 1    Number of Visits 11    Date for PT Re-Evaluation 06/23/21    PT Start Time 1230    PT Stop Time 1315    PT Time Calculation (min) 45 min    Activity Tolerance Patient tolerated treatment well    Behavior During Therapy 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 Rifampin since 08/2015   Streptococcal infection(041.00)    Age 75 and 25 years old   TB lung, latent    2017   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/2018   Diffuse enlargement on [place] 6/20   Past Surgical History:  Procedure Laterality Date   TONSILLECTOMY  2012   WISDOM TOOTH EXTRACTION     Patient Active Problem List   Diagnosis Date Noted   Pain in left hip 01/27/2021   Bruising 01/27/2021   Left-sided chest wall pain 03/23/2020   Positive ANA (antinuclear antibody) 03/23/2020   Asthma 02/10/2020   Allergic rhinitis 02/10/2020   GERD (gastroesophageal reflux disease) 02/10/2020   History of TB (tuberculosis) 08/21/2019   Throat pain 01/28/2019   Odynophagia 01/28/2019   Neck pain 01/28/2019   Thyroiditis 01/12/2019   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/2018   Fatty liver 12/11/2018   PCOS (polycystic ovarian syndrome) 11/11/2018   Seasonal allergies 09/22/2018   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   Migraine with aura and without status migrainosus, not intractable 09/11/2012   Variants of migraine, not elsewhere classified, without mention of intractable migraine without mention of  status migrainosus 09/11/2012   Unspecified constipation 09/11/2012   Syncope 08/31/2010   Neck Pain 08/31/2010   Insomnia 08/31/2010    PCP: Deeann Saint, MD  REFERRING PROVIDER: Ocie Doyne, MD  REFERRING DIAG: M54.2 (ICD-10-CM) - Cervicalgia  THERAPY DIAG:  Cervicogenic headache  Neck pain  Pain in thoracic spine  ONSET DATE: 03/27/21  SUBJECTIVE:  SUBJECTIVE STATEMENT: Pt reports of chronic history of migraine which is recently getting worst with increased frequency and intensity within past year. Currently she is getting about 10 headaches a month with about 2-3 per week and they can last 1-2 days. Headaches are in front and top of head. She gets light and sound sensitivity. Pt has Rizatriptan but not taking it. She is managing with OTC Excedrin.  PERTINENT HISTORY:  Chronic hx of migraine/headache  PAIN:  Are you having pain? Yes VAS scale: 6/10 Pain location: bil Pain orientation: Right and Left  PAIN TYPE: aching and tight Pain description: constant  Aggravating factors: lifting, computer work Relieving factors: rest, meds  PRECAUTIONS: None  WEIGHT BEARING RESTRICTIONS No  FALLS:  Has patient fallen in last 6 months? No   OCCUPATION: Secretary  PLOF: Independent  PATIENT GOALS Improve neck pain, headaches  OBJECTIVE:   DIAGNOSTIC FINDINGS:  None recent  COGNITION: Overall cognitive status: Within functional limits for tasks assessed POSTURE:  Rounded shoulders, forward head, increased thoracic kyphosis    CERVICAL AROM/PROM  A/PROM A/PROM (deg) 04/14/2021  Flexion 75 pain at end  range.  Extension 82 deg  Right rotation 90 deg pain (more painful then L rotation)  Left rotation 90 deg pain   (Blank rows = not tested)  UE AROM/PROM:  WNL bil without any pain  UE MMT:  MMT Right 04/14/2021 Left 04/14/2021  Shoulder flexion 5 5  Shoulder abduction 5 5  Middle trapezius 3+ 3+  Lower trapezius 3+ 3+   (Blank rows = not tested)  SPINAL SEGMENTAL MOBILITY:  Hypomobile at C5-5, T1-2   PATIENT SURVEYS:  Headache Disability Index:  33%  TODAY'S TREATMENT:  Manual therapy:  Unilateral PA mobilization in cervical spine at 1st rib bil, T1-2 bil, T4 on R, C5-6 bil  Soft tissue massage to bil upper trap and levator scapulae, suboccipital region, cervical paraspinalis  Grade V thoracic extension mobilization  PATIENT EDUCATION:  Education details: Discussed findigs from session and emphasis on strengthenig of periscapualr musculature to improve posture and postural awareness at work. Person educated: Patient Education method: Explanation Education comprehension: verbalized understanding   HOME EXERCISE PROGRAM: None issued  ASSESSMENT:  CLINICAL IMPRESSION: Patient is a 25 y.o. female who was seen today for physical therapy evaluation and treatment for neck pain and chronic headaches. Objective impairments include decreased ROM, decreased strength, hypomobility, increased fascial restrictions, increased muscle spasms, impaired flexibility, postural dysfunction, and pain. These impairments are limiting patient from driving, occupation, school, and taking care of kids . Personal factors including Time since onset of injury/illness/exacerbation are also affecting patient's functional outcome. Patient will benefit from skilled PT to address above impairments and improve overall function.  REHAB POTENTIAL: Good  CLINICAL DECISION MAKING: Stable/uncomplicated  EVALUATION COMPLEXITY: Low   GOALS: Goals reviewed with patient? Yes  SHORT TERM GOALS:  STG Name Target Date Goal status  1 Patient will demo decreased frequency of headaches to 1 times per week to improve function at home Baseline: 2-3 times per week  (eval) 05/12/2021 INITIAL  2 Pt will report compliance with seated posture at work to reduce tension in cervicothoracic musculature and joints Baseline: Edcuated provided on Eval 05/12/2021 INITIAL   LONG TERM GOALS:   LTG Name Target Date Goal status  1 Pt will report improvement by at least 15% on Headache disability index to improve overall function. Baseline: 33% % (eval) 05/12/2021 INITIAL  2 Pt will report <2 headaches per month to impove headache frequency  Baseline: 10 headaches per month (eval) 06/23/2021 INITIAL   PLAN: PT FREQUENCY: 1x/week  PT DURATION: 10 weeks  PLANNED INTERVENTIONS: Therapeutic exercises, Therapeutic activity, Neuro Muscular re-education, Balance training, Gait training, Patient/Family education, Joint mobilization, Spinal mobilization, Cryotherapy, Moist heat, Traction, and Manual therapy  PLAN FOR NEXT SESSION: Issue HEP   Ileana Ladd, PT 04/14/2021, 12:26 PM

## 2021-04-21 ENCOUNTER — Ambulatory Visit: Payer: No Typology Code available for payment source

## 2021-04-23 ENCOUNTER — Ambulatory Visit: Payer: No Typology Code available for payment source

## 2021-04-24 ENCOUNTER — Ambulatory Visit (INDEPENDENT_AMBULATORY_CARE_PROVIDER_SITE_OTHER): Payer: No Typology Code available for payment source | Admitting: Family Medicine

## 2021-04-24 ENCOUNTER — Encounter: Payer: Self-pay | Admitting: Family Medicine

## 2021-04-24 ENCOUNTER — Other Ambulatory Visit: Payer: Self-pay

## 2021-04-24 VITALS — BP 116/78 | HR 79 | Temp 98.4°F | Wt 116.4 lb

## 2021-04-24 DIAGNOSIS — J029 Acute pharyngitis, unspecified: Secondary | ICD-10-CM | POA: Diagnosis not present

## 2021-04-24 DIAGNOSIS — H65191 Other acute nonsuppurative otitis media, right ear: Secondary | ICD-10-CM

## 2021-04-24 DIAGNOSIS — R052 Subacute cough: Secondary | ICD-10-CM

## 2021-04-24 DIAGNOSIS — J4540 Moderate persistent asthma, uncomplicated: Secondary | ICD-10-CM

## 2021-04-24 DIAGNOSIS — H6981 Other specified disorders of Eustachian tube, right ear: Secondary | ICD-10-CM | POA: Diagnosis not present

## 2021-04-24 MED ORDER — ALBUTEROL SULFATE HFA 108 (90 BASE) MCG/ACT IN AERS: 2.0000 | INHALATION_SPRAY | Freq: Four times a day (QID) | RESPIRATORY_TRACT | 0 refills | Status: DC | PRN

## 2021-04-24 MED ORDER — ALBUTEROL SULFATE (2.5 MG/3ML) 0.083% IN NEBU: 2.5000 mg | INHALATION_SOLUTION | Freq: Four times a day (QID) | RESPIRATORY_TRACT | 1 refills | Status: DC | PRN

## 2021-04-24 MED ORDER — NEBULIZER/TUBING/MOUTHPIECE KIT: PACK | 3 refills | Status: DC

## 2021-04-24 MED ORDER — AZITHROMYCIN 200 MG/5ML PO SUSR: ORAL | 0 refills | Status: DC

## 2021-04-24 MED ORDER — AZITHROMYCIN 250 MG PO TABS: ORAL_TABLET | ORAL | 0 refills | Status: DC

## 2021-04-24 NOTE — Progress Notes (Signed)
Subjective:    Patient ID: Maria Smith, female    DOB: 05/01/1996, 25 y.o.   MRN: [id number]  Chief Complaint  Patient presents with   Ear Pain    Rt ear pain, going on a few weeks. States it is really painful. Was given prednison and tessalon, did not help. Has a cough as well. UC- had bronchitis  UC -double ear infection    HPI Patient was seen today for ongoing concerns.  Pt seen at [facility] on 03/19/21, dx'd with acute bronchitis.  Pt seen again at [facility] 04/02/21 for b/l ear pain.  Given rx for Keflex suspension x 10 days.  Still having R ear pain, R sided sore throat, and cough.  Endorses pain with swallowing.  Cough present x 2 wks.  Tried tessalon, prednisone, and left over promethazine.  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 CV: Denies CP, palpitations, SOB, orthopnea Pulm: Denies SOB, cough, wheezing + cough GI: Denies abdominal pain, nausea, vomiting, diarrhea, constipation GU: Denies dysuria, hematuria, frequency, vaginal discharge Msk: Denies muscle cramps, joint pains Neuro: Denies weakness, numbness, tingling Skin: Denies rashes, bruising Psych: Denies depression, anxiety, hallucinations      Objective:    Blood pressure 116/78, pulse 79, temperature 98.4 F (36.9 C), temperature source  Oral, weight 116 lb 6.4 oz (52.8 kg), SpO2 99 %.  Gen. Pleasant, well-nourished, in no distress, normal affect   HEENT: [place]/AT, face symmetric, conjunctiva clear, no scleral icterus, PERRLA, EOMI, nares patent without drainage, pharynx without erythema or exudate. B/l external ears and canals normal.  B/l Tms full without erythema, R TM with cloudy appearing fluid in upper portion.  Lungs: no accessory muscle use, L upper lobe anteriorly with mild wheezing, otherwise CTAB.  No rales or rhonci  Cardiovascular: RRR, no m/r/g, no peripheral edema Abdomen: BS present, soft, NT/ND, no hepatosplenomegaly. Musculoskeletal: No deformities, no cyanosis or clubbing, normal tone Neuro:  A&Ox3, CN II-XII intact, normal gait Skin:  Warm, no lesions/ rash   Wt Readings from Last 3 Encounters:  04/24/21 116 lb 6.4 oz (52.8 kg)  03/27/21 114 lb (51.7 kg)  01/27/21 107 lb 9.6 oz (48.8 kg)    Lab Results  Component Value Date   WBC 3.4 (L) 09/19/2020   HGB 13.2 09/19/2020   HCT 39.3 09/19/2020   PLT 250.0 09/19/2020   GLUCOSE 75 09/19/2020   CHOL 167 06/26/2017   TRIG 32 06/26/2017   HDL 94 06/26/2017   LDLCALC 67 06/26/2017   ALT 18 09/19/2020   AST 26 09/19/2020   NA 138 09/19/2020   K 4.3 09/19/2020   CL 104 09/19/2020  CREATININE 0.70 09/19/2020   BUN 7 09/19/2020   CO2 29 09/19/2020   TSH 1.58 09/19/2020   INR 1.1 (H) 09/19/2020    Assessment/Plan:  Dysfunction of right eustachian tube -flonase and or OTC antihistamine prn  Acute effusion of right ear  -supportive care tylenol or NSAIDs, flonase or po antihistamine - Plan: azithromycin (ZITHROMAX) 200 MG/5ML suspension,  Sore throat -supportive care including gargling with warm salt water or chloraseptic spray,  tylenol or NSAIDs prn  Moderate persistent asthma without complication  -stable without acute exacerbation - Plan: albuterol (PROVENTIL) (2.5 MG/3ML) 0.083% nebulizer solution, albuterol (VENTOLIN HFA) 108 (90 Base)  MCG/ACT inhaler, Respiratory Therapy Supplies (NEBULIZER/TUBING/MOUTHPIECE) KIT  Subacute cough -supportive care  F/u prn  Grier Mitts, MD

## 2021-04-27 ENCOUNTER — Ambulatory Visit: Payer: No Typology Code available for payment source | Attending: Psychiatry

## 2021-04-27 ENCOUNTER — Other Ambulatory Visit: Payer: Self-pay

## 2021-04-27 DIAGNOSIS — M542 Cervicalgia: Secondary | ICD-10-CM | POA: Insufficient documentation

## 2021-04-27 DIAGNOSIS — G4486 Cervicogenic headache: Secondary | ICD-10-CM | POA: Insufficient documentation

## 2021-04-27 DIAGNOSIS — M546 Pain in thoracic spine: Secondary | ICD-10-CM | POA: Insufficient documentation

## 2021-04-27 DIAGNOSIS — M6281 Muscle weakness (generalized): Secondary | ICD-10-CM | POA: Insufficient documentation

## 2021-04-27 NOTE — Therapy (Signed)
OUTPATIENT PHYSICAL THERAPY TREATMENT NOTE   Patient Name: Maria Smith MRN: [id number] DOB:Oct 22, 1995, 25 y.o., female Today's Date: 04/27/2021  PCP: Deeann Saint, MD    PT End of Session - 04/27/21 1402     Visit Number 2    Number of Visits 11    Date for PT Re-Evaluation 06/23/21    PT Start Time 0955    PT Stop Time 1035    PT Time Calculation (min) 40 min    Activity Tolerance Patient tolerated treatment well    Behavior During Therapy [facility] for tasks assessed/performed             Past Medical History:  Diagnosis Date   Allergic rhinitis    Allergy    SEASONAL   Asthma    Eczema    Environmental allergies    Age 39 years   Fatty liver 12/11/2018   Noted on CT 6/20   Generalized headaches    Began at age 15 years   GERD (gastroesophageal reflux disease)    Influenza    Age 82 years   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 Rifampin since 08/2015   Streptococcal infection(041.00)    Age 58 and 25 years old   TB lung, latent    2017   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/2018   Diffuse enlargement on [place] 6/20   Past Surgical History:  Procedure Laterality Date   TONSILLECTOMY  2012   WISDOM TOOTH EXTRACTION     Patient Active Problem List   Diagnosis Date Noted   Pain in left hip 01/27/2021   Bruising 01/27/2021   Left-sided chest wall pain 03/23/2020   Positive ANA (antinuclear antibody) 03/23/2020   Asthma 02/10/2020   Allergic rhinitis 02/10/2020   GERD (gastroesophageal reflux disease) 02/10/2020   History of TB (tuberculosis) 08/21/2019   Throat pain 01/28/2019   Odynophagia 01/28/2019   Neck pain 01/28/2019   Thyroiditis 01/12/2019   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/2018   Fatty liver 12/11/2018   PCOS (polycystic ovarian syndrome) 11/11/2018   Seasonal allergies 09/22/2018   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   Migraine with aura and without status migrainosus, not intractable 09/11/2012   Variants of migraine, not elsewhere classified, without mention of intractable  migraine without mention of status migrainosus 09/11/2012   Unspecified constipation 09/11/2012   Syncope 08/31/2010   Neck Pain 08/31/2010   Insomnia 08/31/2010    REFERRING PROVIDER: Ocie Doyne, MD   REFERRING DIAG: M54.2 (ICD-10-CM) - Cervicalgia  THERAPY DIAG:  Cervicogenic headache  Neck pain  Pain in thoracic spine  Muscle weakness (generalized)  PERTINENT HISTORY:  Chronic hx of migraine/headache     PRECAUTIONS: None   WEIGHT BEARING RESTRICTIONS No   OCCUPATION: Secretary     PATIENT GOALS Improve neck pain, headaches  SUBJECTIVE: I have only had one mild headache since last session.  PAIN:  Are you having pain? Yes VAS scale: 2/10 Pain location: neck Pain orientation: Left  PAIN TYPE: aching and tightness Pain description: intermittent     OBJECTIVE:   TODAY'S TREATMENT: TODAY'S TREATMENT:   04/27/21: Soft tissue massage to suboccipital region, upper trap and levator scapulae Grade IV movement with mobilization with unilateral PA at C7, T1-1st rib area, performed R and L with functional movement of contralateral lateral flexion, flexion or flexion/rotation Grade V thoracic extension mobilization Grade IV central PA mobilization at C2 Grade IV lateral glides in upper cervical spine L only  TherEx: Quadruped alternating UE and LE extensions: 15x R and L Plank  on elbows on wall with serratus press: 2 x 10 Educated on posture: not crossing legs, sitting all the way back in the chair with back against back rest Standing lumbar flexion stretch: 5 x 10" for dural stretching  EvaL;  Manual therapy:           Unilateral PA mobilization in cervical spine at 1st rib bil, T1-2 bil, T4 on R, C5-6 bil           Soft tissue massage to bil upper trap and levator scapulae, suboccipital region, cervical paraspinalis           Grade V thoracic extension mobilization   PATIENT EDUCATION:  Education details: Discussed findigs from session and emphasis  on strengthenig of periscapualr musculature to improve posture and postural awareness at work. Person educated: Patient Education method: Explanation Education comprehension: verbalized understanding     HOME EXERCISE PROGRAM: Access Code [email or web address]   ASSESSMENT:   CLINICAL IMPRESSION: Pt reporting decreased intensity and frequency of headache since first session. Decreased mobility is evident in costovertebral joints in mid and upper thoracic spine along with decreased segmental mobility in upper to mid thoracic spine. Pt demo poor core and scapular stabilization with stability exercises today.   REHAB POTENTIAL: Good   CLINICAL DECISION MAKING: Stable/uncomplicated   EVALUATION COMPLEXITY: Low     GOALS: Goals reviewed with patient? Yes   SHORT TERM GOALS:   STG Name Target Date Goal status  1 Patient will demo decreased frequency of headaches to 1 times per week to improve function at home Baseline: 2-3 times per week (eval) 05/12/2021 Progressing  2 Pt will report compliance with seated posture at work to reduce tension in cervicothoracic musculature and joints Baseline: Edcuated provided on Eval 05/12/2021 Progressing    LONG TERM GOALS:    LTG Name Target Date Goal status  1 Pt will report improvement by at least 15% on Headache disability index to improve overall function. Baseline: 33% % (eval) 05/12/2021 INITIAL  2 Pt will report <2 headaches per month to impove headache frequency Baseline: 10 headaches per month (eval) 06/23/2021 INITIAL    PLAN: PT FREQUENCY: 1x/week   PT DURATION: 10 weeks   PLANNED INTERVENTIONS: Therapeutic exercises, Therapeutic activity, Neuro Muscular re-education, Balance training, Gait training, Patient/Family education, Joint mobilization, Spinal mobilization, Cryotherapy, Moist heat, Traction, and Manual therapy   PLAN FOR NEXT SESSION: Iupper thoracic mobility, core stabilization, sapular stabilization   Ileana Ladd,  PT 04/27/2021, 2:09 PM

## 2021-04-28 ENCOUNTER — Ambulatory Visit: Payer: No Typology Code available for payment source

## 2021-05-02 ENCOUNTER — Encounter: Payer: Self-pay | Admitting: Advanced Practice Midwife

## 2021-05-02 ENCOUNTER — Other Ambulatory Visit: Payer: Self-pay

## 2021-05-02 ENCOUNTER — Ambulatory Visit: Payer: No Typology Code available for payment source | Admitting: Advanced Practice Midwife

## 2021-05-02 VITALS — BP 113/61 | HR 67 | Ht <= 58 in | Wt 114.0 lb

## 2021-05-02 DIAGNOSIS — N6323 Unspecified lump in the left breast, lower outer quadrant: Secondary | ICD-10-CM

## 2021-05-02 NOTE — Progress Notes (Signed)
   Subjective:    Patient ID: Maria Smith, female    DOB: [birth year], 25 y.o.   MRN: [id number] This is a 25 y.o. female  who presents with c/o painful new left lateral breast mass since Friday 04/28/21 Has never had this pain or mass before  Gynecologic Exam The patient's pertinent negatives include no genital itching or pelvic pain. Primary symptoms comment: painful lump in left breast. This is a new problem. The current episode started in the past 7 days. The problem occurs constantly. The problem has been unchanged. The pain is moderate. The problem affects the left side. She is not pregnant. Pertinent negatives include no abdominal pain, back pain or chills. The symptoms are aggravated by tactile pressure. She has tried nothing for the symptoms.     Review of Systems  Constitutional:  Negative for chills.  Gastrointestinal:  Negative for abdominal pain.  Genitourinary:  Negative for pelvic pain.  Musculoskeletal:  Negative for back pain.      Objective:   Physical Exam Constitutional:      General: She is not in acute distress.    Appearance: She is not ill-appearing or toxic-appearing.  HENT:     Head: Normocephalic.  Cardiovascular:     Rate and Rhythm: Normal rate.  Pulmonary:     Effort: Pulmonary effort is normal.  Chest:     Chest wall: Mass present.  Breasts:    Right: No mass or tenderness.     Left: Mass and tenderness present.       Comments: Left lateral 1cm tender mass with tenderness in left axilla also Abdominal:     Tenderness: There is no abdominal tenderness.  Musculoskeletal:        General: Normal range of motion.  Skin:    General: Skin is warm and dry.  Neurological:     General: No focal deficit present.     Mental Status: She is alert.          Assessment & Plan:  A:   Left breast mass       Left breast tenderness  P:   Ordered diagnostic mammogram       Followup as indicated

## 2021-05-02 NOTE — Progress Notes (Signed)
Patient noticed lump in her left breast on Friday Nov11. Armandina Stammer RN

## 2021-06-01 ENCOUNTER — Other Ambulatory Visit: Payer: No Typology Code available for payment source

## 2021-06-08 ENCOUNTER — Ambulatory Visit
Admission: RE | Admit: 2021-06-08 | Discharge: 2021-06-08 | Disposition: A | Discharge status: Home / Self Care | Payer: No Typology Code available for payment source | Source: Ambulatory Visit | Attending: Advanced Practice Midwife | Admitting: Advanced Practice Midwife

## 2021-06-08 DIAGNOSIS — N6323 Unspecified lump in the left breast, lower outer quadrant: Secondary | ICD-10-CM

## 2021-06-29 ENCOUNTER — Telehealth: Payer: No Typology Code available for payment source | Admitting: Family Medicine

## 2021-06-30 ENCOUNTER — Ambulatory Visit: Payer: Self-pay | Admitting: Obstetrics and Gynecology

## 2021-07-06 ENCOUNTER — Ambulatory Visit (INDEPENDENT_AMBULATORY_CARE_PROVIDER_SITE_OTHER): Payer: Managed Care, Other (non HMO) | Admitting: Family Medicine

## 2021-07-06 ENCOUNTER — Encounter: Payer: Self-pay | Admitting: Family Medicine

## 2021-07-06 ENCOUNTER — Other Ambulatory Visit: Payer: Self-pay

## 2021-07-06 VITALS — BP 117/65 | HR 65 | Wt 117.0 lb

## 2021-07-06 DIAGNOSIS — N914 Secondary oligomenorrhea: Secondary | ICD-10-CM

## 2021-07-06 NOTE — Progress Notes (Signed)
° °  Subjective:    Patient ID: Maria Smith, female    DOB: 1995-11-22, 26 y.o.   MRN: [id number]  HPI Patient seen for irregular periods.  Over the past year, she has had 2 periods that are approximately 28 days apart.  Remainder are anywhere from 6 weeks to 11 weeks apart.  She has been tracking her cycles.  In the past, she has been told that she has PCOS with the prior physician.  She has also been told that she does not have PCOS.  She denies female pattern hair growth on her face, chin, neck, chest.  She does have some thicker hair growth on her arms and legs.  She is unaware of any blood work that has been done to show hyperandrogenism.  She would like to get pregnant in the near future.  She has a female partner.   Review of Systems     Objective:   Physical Exam Vitals reviewed.  Constitutional:      Appearance: Normal appearance.  Cardiovascular:     Rate and Rhythm: Normal rate and regular rhythm.     Pulses: Normal pulses.     Heart sounds: Normal heart sounds.  Pulmonary:     Effort: Pulmonary effort is normal.  Abdominal:     General: Abdomen is flat. There is no distension.     Palpations: Abdomen is soft.     Tenderness: There is no abdominal tenderness.  Skin:    Capillary Refill: Capillary refill takes less than 2 seconds.     Comments: No hirsutism present  Neurological:     General: No focal deficit present.     Mental Status: She is alert.  Psychiatric:        Mood and Affect: Mood normal.        Behavior: Behavior normal.        Thought Content: Thought content normal.        Judgment: Judgment normal.      Assessment & Plan:  1. Secondary oligomenorrhea While classic PCOS does have combination of oligomenorrhea, either physical or serum evidence of hyperandrogenism, and polycystic ovaries, there are nonclassic forms of PCOS as well including normal androgenic oligomenorrheic PCOS.  It is possible that the patient has this.  We will check  hormone levels including thyroid, testosterone, FSH, LH, hemoglobin A1c.  We will have patient return to discuss results and treatment options. - Thyroid Panel With TSH - TestT+TestF+SHBG - Follicle stimulating hormone - LH - Hemoglobin A1c

## 2021-07-12 LAB — TESTT+TESTF+SHBG
Sex Hormone Binding: 78.6 nmol/L (ref 24.6–122.0)
Testosterone, Free: 1.1 pg/mL (ref 0.0–4.2)
Testosterone, Total, LC/MS: 41.4 ng/dL (ref 10.0–55.0)

## 2021-07-12 LAB — THYROID PANEL WITH TSH
Free Thyroxine Index: 2.3 (ref 1.2–4.9)
T3 Uptake Ratio: 27 % (ref 24–39)
T4, Total: 8.4 ug/dL (ref 4.5–12.0)
TSH: 1.58 u[IU]/mL (ref 0.450–4.500)

## 2021-07-12 LAB — FOLLICLE STIMULATING HORMONE: FSH: 7.8 m[IU]/mL

## 2021-07-12 LAB — HEMOGLOBIN A1C
Est. average glucose Bld gHb Est-mCnc: 103 mg/dL
Hgb A1c MFr Bld: 5.2 % (ref 4.8–5.6)

## 2021-07-12 LAB — LUTEINIZING HORMONE: LH: 23.3 m[IU]/mL

## 2021-08-10 ENCOUNTER — Telehealth (INDEPENDENT_AMBULATORY_CARE_PROVIDER_SITE_OTHER): Payer: Managed Care, Other (non HMO) | Admitting: Family Medicine

## 2021-08-10 DIAGNOSIS — E282 Polycystic ovarian syndrome: Secondary | ICD-10-CM | POA: Diagnosis not present

## 2021-08-10 NOTE — Progress Notes (Signed)
° ° °  GYNECOLOGY VIRTUAL VISIT ENCOUNTER NOTE  Provider location: [facility]   Patient location: Home  I connected with Maria Smith on 08/10/21 at  4:10 PM EST by MyChart Video Encounter and verified that I am speaking with the correct person using two identifiers.   I discussed the limitations, risks, security and privacy concerns of performing an evaluation and management service virtually and the availability of in person appointments. I also discussed with the patient that there may be a patient responsible charge related to this service. The patient expressed understanding and agreed to proceed.   History:  Maria Smith is a 26 y.o. G1P0000 female being evaluated today for follow-up of secondary oligomenorrhea.  She did recently have a cycle.  Her cycles have been approximately every 45 days.  Things have not changed much.. She denies any abnormal vaginal discharge, bleeding, pelvic pain or other concerns.       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    Review of Systems:  Pertinent items noted in HPI and remainder of  comprehensive ROS otherwise negative.  Physical Exam:   General:  Alert, oriented and cooperative. Patient appears to be in no acute distress.  Mental Status: Normal mood and affect. Normal behavior. Normal judgment and thought content.   Respiratory: Normal respiratory effort, no problems with respiration noted  Rest of physical exam deferred due to type of encounter  Labs and Imaging No results found for this or any previous visit (from the past 336 hour(s)). No results found.     Assessment and Plan:     1. PCOS (polycystic ovarian syndrome) Normoandrogenic.  Discussed metformin, OCPs, cyclical progesterone. Patient interested in natural ways to treat PCOS. At the present, I am unaware of any natural ways, but would investigate and discuss with patient.      I discussed the assessment and treatment plan with the patient. The patient was provided an opportunity to ask questions and all were answered. The patient agreed with the plan and demonstrated an understanding of the instructions.   The patient was advised to call back or seek an in-person evaluation/go to the ED if the symptoms worsen or if the condition fails to improve as anticipated.  I provided 10 minutes of face-to-face time during this encounter.   Levie Heritage, DO [facility] for [employer]

## 2021-08-10 NOTE — Progress Notes (Signed)
Follow up lab work and secondary oligomenorrhea. Kathrene Alu Rn

## 2021-08-28 ENCOUNTER — Ambulatory Visit: Payer: Managed Care, Other (non HMO) | Admitting: Family Medicine

## 2021-08-29 ENCOUNTER — Ambulatory Visit: Payer: Managed Care, Other (non HMO) | Admitting: Family Medicine

## 2021-08-29 ENCOUNTER — Ambulatory Visit (INDEPENDENT_AMBULATORY_CARE_PROVIDER_SITE_OTHER): Payer: Managed Care, Other (non HMO) | Admitting: Family Medicine

## 2021-08-29 VITALS — BP 108/70 | HR 66 | Temp 99.2°F | Wt 118.0 lb

## 2021-08-29 DIAGNOSIS — J029 Acute pharyngitis, unspecified: Secondary | ICD-10-CM | POA: Diagnosis not present

## 2021-08-29 DIAGNOSIS — J019 Acute sinusitis, unspecified: Secondary | ICD-10-CM

## 2021-08-29 LAB — POC COVID19 BINAXNOW: SARS Coronavirus 2 Ag: NEGATIVE

## 2021-08-29 LAB — POCT RAPID STREP A (OFFICE): Rapid Strep A Screen: NEGATIVE

## 2021-08-29 MED ORDER — AMOXICILLIN-POT CLAVULANATE 400-57 MG PO CHEW: 2.0000 | CHEWABLE_TABLET | Freq: Two times a day (BID) | ORAL | 0 refills | Status: DC

## 2021-08-29 NOTE — Progress Notes (Signed)
? ?  Subjective:  ? ? Patient ID: Maria Smith, female    DOB: 1995-07-15, 26 y.o.   MRN: [id number] ? ?HPI ?Here for 6 days of sinus congestion, right ear pain, PND, ST, fever to 100.2 degrees, and coughing up clear sputum. Taking Theraflu and Tylenol.  ? ? ?Review of Systems  ?Constitutional:  Positive for fever.  ?HENT:  Positive for congestion, ear pain, postnasal drip, sinus pressure and sore throat.   ?Eyes: Negative.   ?Respiratory:  Positive for cough. Negative for shortness of breath and wheezing.   ?Gastrointestinal: Negative.   ? ?   ?Objective:  ? Physical Exam ?Constitutional:   ?   Appearance: Normal appearance.  ?HENT:  ?   Right Ear: Tympanic membrane, ear canal and external ear normal.  ?   Left Ear: Tympanic membrane, ear canal and external ear normal.  ?   Nose: Nose normal.  ?   Mouth/Throat:  ?   Pharynx: Oropharynx is clear.  ?Eyes:  ?   Conjunctiva/sclera: Conjunctivae normal.  ?Pulmonary:  ?   Effort: Pulmonary effort is normal.  ?   Breath sounds: Normal breath sounds.  ?Lymphadenopathy:  ?   Cervical: No cervical adenopathy.  ?Neurological:  ?   Mental Status: She is alert.  ? ? ? ? ? ?   ?Assessment & Plan:  ?Sinusitis, treat with Augmentin 400-57 chewable tablets, 2 tabs BID for 10 days. Add Mucinex as needed.  ?Gershon Crane, MD ? ? ?

## 2021-09-01 ENCOUNTER — Ambulatory Visit: Payer: Managed Care, Other (non HMO) | Admitting: Family Medicine

## 2021-09-08 ENCOUNTER — Other Ambulatory Visit: Payer: Self-pay

## 2021-09-08 ENCOUNTER — Telehealth (INDEPENDENT_AMBULATORY_CARE_PROVIDER_SITE_OTHER): Payer: Managed Care, Other (non HMO) | Admitting: Family Medicine

## 2021-09-08 ENCOUNTER — Encounter: Payer: Self-pay | Admitting: Family Medicine

## 2021-09-08 DIAGNOSIS — R1310 Dysphagia, unspecified: Secondary | ICD-10-CM

## 2021-09-08 NOTE — Progress Notes (Signed)
Virtual Visit via Video Note ? ?I connected with Maria Smith on 09/08/21 at 11:30 AM EDT by a video enabled telemedicine application 2/2 [id number] pandemic and verified that I am speaking with the correct person using two identifiers. ? Location patient: home ?Location provider:work or home office ?Persons participating in the virtual visit: patient, provider ? ?I discussed the limitations of evaluation and management by telemedicine and the availability of in person appointments. The patient expressed understanding and agreed to proceed. ? ?Chief Complaint  ?Patient presents with  ? throat  ?  Having issues with throat, and pain along the jaw line. Had an MBS, it was normal. States referral was placed a while ago for ENT, but they did not accept her ins. Now has new ins and would like to go somewhere that accepts it   ? ? ?HPI: ?Pt is a 26 yo female with pmh sig for allergies, asthma, migraines, h/o fatty liver, PCOS, GERD, positive ANA, and throat pain seen for ongoing concern.  Pt  with sore throat that seems to be getting worse over the last 2.5 months.  Endorses fullness in throat, fatigue, feels like food gets stuck under tongue when eating.  Also having soreness around jaw line.  Now unable to eat seeds.  Endorses difficulty swallowing hard items for yrs and trouble with pills or things she has to "really chew up".   Pt's appetite is decreased as she is worried about having to clear her throat/the sensation of the food becoming stuck.  Having tenderness in R side of face/cheek.  No swelling of side of face.  Dry eyes, L>R. Currently has a blocked duct on eyelid. ? ?Hasn't been able to f/u with ENT as they do not take her insurance. Had a normal swallow study with GI in the past. ? ?Of note patient was last seen 08/29/2021.  Given Augmentin for sore throat and sinusitis. ? ?ROS: See pertinent positives and negatives per HPI. ? ?Past Medical History:  ?Diagnosis Date  ? Allergic rhinitis   ? Allergy    ? SEASONAL  ? Asthma   ? Eczema   ? Environmental allergies   ? Age 71 years  ? Fatty liver 12/11/2018  ? Noted on CT 6/20  ? Generalized headaches   ? Began at age 21 years  ? GERD (gastroesophageal reflux disease)   ? Influenza   ? Age 58 years  ? 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 Taking Rifampin since 08/2015  ? Streptococcal infection(041.00)   ? Age 3 and 26 years old  ? 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 Diffuse enlargement on [place] 6/20  ? ? ?Past Surgical History:  ?Procedure Laterality Date  ? TONSILLECTOMY  2012  ? WISDOM TOOTH EXTRACTION    ? ? ?Family History  ?Problem Relation Age of Onset  ? Migraines Paternal Grandmother   ? Migraines Paternal Grandfather   ? HIV Father   ? Diabetes Mother   ? Hypertension Mother   ? Anemia Mother   ? COPD Mother   ? CVA Mother   ? Congestive Heart Failure Mother   ? Cirrhosis Mother   ? Obesity Mother   ? Irritable bowel syndrome Mother   ? Rheum arthritis Mother   ? Ulcers Brother   ? Anemia Sister   ? Cholecystitis Brother   ? Migraines Paternal Aunt   ? Migraines Maternal Uncle   ?     Maternal Great Uncle  ? Diabetes  Other   ?     Family History  ? Hypertension Other   ?     Family History  ? Depression Other   ?     Family History  ? Asthma Other   ?     Family History  ? Allergic rhinitis Other   ?     Family History  ? Asthma Brother   ? Colon cancer Neg Hx   ? Colon polyps Neg Hx   ? Esophageal cancer Neg Hx   ? Rectal cancer Neg Hx   ? Stomach cancer Neg Hx   ? ? ?Current Outpatient Medications:  ?  albuterol (PROVENTIL) (2.5 MG/3ML) 0.083% nebulizer solution, Take 3 mLs (2.5 mg total) by nebulization every 6 (six) hours as needed for wheezing or shortness of breath., Disp: 150 mL, Rfl: 1 ?  albuterol (VENTOLIN HFA) 108 (90 Base) MCG/ACT inhaler, Inhale 2 puffs into the lungs every 6 (six) hours as needed for wheezing or shortness of breath., Disp: 8 g, Rfl: 0 ?  amoxicillin-clavulanate (AUGMENTIN) 400-57 MG chewable tablet, Chew 2 tablets by mouth 2  (two) times daily., Disp: 40 tablet, Rfl: 0 ?  Ascorbic Acid (VITAMIN C) 1000 MG tablet, Take 1,000 mg by mouth daily., Disp: , Rfl:  ?  fluticasone (FLONASE) 50 MCG/ACT nasal spray, Place 2 sprays into both nostrils daily., Disp: 16 g, Rfl: 0 ?  fluticasone (FLOVENT HFA) 44 MCG/ACT inhaler, Inhale 2 puffs into the lungs in the morning and at bedtime., Disp: 1 each, Rfl: 0 ?  Multiple Vitamin (MULTIVITAMIN) tablet, Take 1 tablet by mouth daily., Disp: , Rfl:  ?  omeprazole (PRILOSEC) 40 MG capsule, Take 1 capsule (40 mg total) by mouth 2 (two) times daily., Disp: 60 capsule, Rfl: 3 ?  Respiratory Therapy Supplies (NEBULIZER/TUBING/MOUTHPIECE) KIT, Use as directed., Disp: 1 kit, Rfl: 3 ? ?EXAM: ? ?VITALS per patient if applicable: RR between 09-73 bpm ? ?GENERAL: alert, oriented, appears well and in no acute distress ? ?HEENT: atraumatic, conjunctiva clear, no obvious abnormalities on inspection of external nose and ears ? ?NECK: normal movements of the head and neck ? ?LUNGS: on inspection no signs of respiratory distress, breathing rate appears normal, no obvious gross SOB, gasping or wheezing ? ?CV: no obvious cyanosis ? ?MS: moves all visible extremities without noticeable abnormality ? ?PSYCH/NEURO: pleasant and cooperative, no obvious depression or anxiety, speech and thought processing grossly intact ? ?ASSESSMENT AND PLAN: ? ?Discussed the following assessment and plan: ? ?Dysphagia, unspecified type  ?-Patient with sensation that food becomes stuck underneath/along the sides of tongue when attempting to swallow. ?-Discussed taking small bites of food and drinking fluids during meals. ?-anxiety regarding the possibility that food will get stuck possibly contributing to symptoms/making them worse due to the anticipation they will happen. ?-EGD 08/30/2019 with gastritis and esophagitis. ?-Laryngoscopy 02/06/2020 normal ?-Swallow study normal on 05/04/2020. ?-CT soft tissue neck with contrast 06/29/2020 with  minimal mucosal thickening in left maxillary sinus, otherwise normal ?-Given return of symptoms and current limitations in diet advised to follow-up again with GI and ENT for esophageal manometry. ?- Plan: Ambulatory referral to Gastroenterology ? ?Follow-up as needed ?  ?I discussed the assessment and treatment plan with the patient. The patient was provided an opportunity to ask questions and all were answered. The patient agreed with the plan and demonstrated an understanding of the instructions. ?  ?The patient was advised to call back or seek an in-person evaluation if the symptoms worsen or  if the condition fails to improve as anticipated. ? ? ?Billie Ruddy, MD  ? ?

## 2021-09-21 ENCOUNTER — Telehealth: Payer: Self-pay | Admitting: Family Medicine

## 2021-09-21 NOTE — Telephone Encounter (Signed)
Patient called in asking on what should she do to get her blood type. Patient isn't able to give blood to [facility] due to smaller veins. ? ?Please advise. ?

## 2021-09-25 NOTE — Telephone Encounter (Signed)
The [facility] may be able to do a finger stick to check. Otherwise a blood draw would be necessary.   ?

## 2021-10-02 NOTE — Telephone Encounter (Signed)
Spoke with pt, is aware. 

## 2021-10-05 ENCOUNTER — Telehealth: Payer: Self-pay | Admitting: Family Medicine

## 2021-10-05 NOTE — Telephone Encounter (Signed)
Disregard

## 2021-10-10 ENCOUNTER — Telehealth: Payer: Self-pay

## 2021-10-10 NOTE — Telephone Encounter (Signed)
error 

## 2021-10-12 ENCOUNTER — Ambulatory Visit: Payer: Managed Care, Other (non HMO) | Admitting: Family Medicine

## 2021-10-12 ENCOUNTER — Encounter: Payer: Self-pay | Admitting: Family Medicine

## 2021-10-12 VITALS — BP 117/75 | HR 70 | Temp 98.4°F | Wt 117.4 lb

## 2021-10-12 DIAGNOSIS — M542 Cervicalgia: Secondary | ICD-10-CM | POA: Diagnosis not present

## 2021-10-12 DIAGNOSIS — J302 Other seasonal allergic rhinitis: Secondary | ICD-10-CM | POA: Diagnosis not present

## 2021-10-12 DIAGNOSIS — M25551 Pain in right hip: Secondary | ICD-10-CM | POA: Diagnosis not present

## 2021-10-12 DIAGNOSIS — M25552 Pain in left hip: Secondary | ICD-10-CM

## 2021-10-12 MED ORDER — CETIRIZINE HCL 10 MG PO TABS: 10.0000 mg | ORAL_TABLET | Freq: Every day | ORAL | 3 refills | Status: DC

## 2021-10-12 MED ORDER — OLOPATADINE HCL 0.2 % OP SOLN: 1.0000 [drp] | Freq: Every day | OPHTHALMIC | 1 refills | Status: DC

## 2021-10-12 NOTE — Progress Notes (Signed)
Subjective:  ? ? Patient ID: Maria Smith, female    DOB: 08/12/1995, 26 y.o.   MRN: [id number] ? ?Chief Complaint  ?Patient presents with  ? Pain  ?  Neck and hip pain. Had bursitis in left hip but is now having pain in both hips. Has been having spasms, had a charlie horse in hip lasted for hours. Ortho Dr Junius Roads has now opened own office no longer with [facility] [facility].  ? ? ?HPI ?Patient was seen today for follow-up on ongoing concerns.  Patient requesting referrals for PT and Ortho.  Previously seen by Dr. Junius Roads for left hip pain.  MRI recommended, however not done.  Now with bilateral hip pain left greater than right from pelvis to mid thigh.  Endorses stiffness, popping, "stuck feeling" in hip.  Pelvis is achy/sore, discomfort is not sharp.  Pt is a side sleeper, having difficulty getting comfortable at night.  Recently had an cramp/spasm in right hip after picking up/holding her son that lasted hours.  Also with midline posterior neck pain thought 2/2 posture per PT.  Knots in shoulders noted.  Pt does more sitting with current job.  Tries to walk around the building during breaks.  Denies radiation of pain or numbness/tingling into b/l UEs.  Migraines stable, followed by Neurology.  Increased muscle tension was causing some of the HAs. ? ?Was out of allergy meds.  Typically has symptoms with change in season, but notes increased symptoms this yr especially with itchy, watery eyes.  Previously on zyrtec.  Tried xyzal in the past. ? ?Past Medical History:  ?Diagnosis Date  ? Allergic rhinitis   ? Allergy   ? SEASONAL  ? Asthma   ? Eczema   ? Environmental allergies   ? Age 77 years  ? Fatty liver 12/11/2018  ? Noted on CT 6/20  ? Generalized headaches   ? Began at age 59 years  ? GERD (gastroesophageal reflux disease)   ? Influenza   ? Age 34 years  ? 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 Taking Rifampin since 08/2015  ? Streptococcal infection(041.00)   ? Age 38 and 26 years old  ? 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 Diffuse enlargement on [place] 6/20  ? ? ?No Known Allergies ? ?ROS ?General: Denies fever, chills, night sweats, changes in weight, changes in appetite ?HEENT: Denies headaches, ear pain, changes in vision, rhinorrhea, sore throat +itchy, watery eyes ?CV: Denies CP, palpitations, SOB, orthopnea ?Pulm: Denies SOB, cough, wheezing ?GI: Denies abdominal pain, nausea, vomiting, diarrhea, constipation ?GU: Denies dysuria, hematuria, frequency, vaginal discharge ?Msk: +b/l hip pain, posterior neck pain, spasm/cramp in R hip ?Neuro: Denies weakness, numbness, tingling ?Skin: Denies rashes, bruising ?Psych: Denies depression, anxiety, hallucinations ? ?   ?Objective:  ?  ?Blood pressure 117/75, pulse 70, temperature 98.4 ?F (36.9 ?C), temperature source Oral, weight 117 lb 6.4 oz (53.3 kg), SpO2 100 %. ? ?Gen. Pleasant, well-nourished, in no distress, normal affect   ?HEENT: [place]/AT, face symmetric, conjunctiva clear, no scleral icterus, PERRLA, EOMI, nares patent without drainage ?Lungs: no accessory muscle use ?Cardiovascular: RRR, no peripheral edema ?Musculoskeletal:  B/l shoulders uneven, L higher than R.  Several areas of tenderness to mild palpation of b/l upper back.  TTP of midline cervical spine and lumbar paraspinal muscles L>R.  Pt nearly falls over with palpation of lateral hips and LEs while standing.  Negative straight leg raise and log roll b/l.  Discomfort in groin and lateral hips/ LEs with moving into FADIR and FABER.  No deformities, no cyanosis or clubbing, normal tone ?Neuro:  A&Ox3, CN II-XII intact, normal gait ?Skin:  Warm, no lesions/ rash ? ? ?Wt Readings from Last 3 Encounters:  ?08/29/21 118 lb (53.5 kg)  ?07/06/21 117 lb (53.1 kg)  ?05/02/21 114 lb (51.7 kg)  ? ? ?Lab Results  ?Component Value Date  ? WBC 3.4 (L) 09/19/2020  ? HGB 13.2 09/19/2020  ? HCT 39.3 09/19/2020  ? PLT 250.0 09/19/2020  ? GLUCOSE 75 09/19/2020  ? CHOL 167 06/26/2017  ? TRIG 32 06/26/2017  ? HDL 94  06/26/2017  ? [place] 67 06/26/2017  ? ALT 18 09/19/2020  ? AST 26 09/19/2020  ? NA 138 09/19/2020  ? K 4.3 09/19/2020  ? CL 104 09/19/2020  ? CREATININE 0.70 09/19/2020  ? BUN 7 09/19/2020  ? CO2 29 09/19/2020  ? TSH 1.580 07/06/2021  ? INR 1.1 (H) 09/19/2020  ? HGBA1C 5.2 07/06/2021  ? ? ?Assessment/Plan: ? On day of service, 38 minutes spent caring for this patient face-to-face, reviewing the chart, counseling and/or coordinating care for plan and treatment of diagnosis below.   ? ?Seasonal allergies  ?-continue supportive care ?-start zyrtec and pataday eye gtts ?-ok to also use flonase or saline nasal rinse ?-for continued symptoms consider singulair ?- Plan: cetirizine (ZYRTEC) 10 MG tablet, Olopatadine HCl 0.2 % SOLN ? ?Cervicalgia  ?-likely 2/2 increased muscle tension and posture. ?-discussed sitting back in chair instead of leaning forward. ?-Consider sitting on exercise ball to improve core strength and posture ?-Continue supportive care including topical analgesics, heat, ice, stretching, massage ?-Would likely benefit from trigger point injections or dry needling exam ?- Plan: Ambulatory referral to Orthopedic Surgery, Ambulatory referral to Physical Therapy ? ?Bilateral hip pain  ?-increasing  ?-continue supportive care ice, heat, rest, stretching, massage, topical analgesics ?-Previously seen by Ortho, Dr. Junius Roads ?- Plan: Ambulatory referral to Orthopedic Surgery, Ambulatory referral to Physical Therapy ? ?F/u prn ? ?Grier Mitts, MD ?

## 2021-10-13 ENCOUNTER — Encounter: Payer: Self-pay | Admitting: Physical Therapy

## 2021-10-13 ENCOUNTER — Ambulatory Visit: Payer: Managed Care, Other (non HMO) | Attending: Family Medicine | Admitting: Physical Therapy

## 2021-10-13 DIAGNOSIS — M25552 Pain in left hip: Secondary | ICD-10-CM

## 2021-10-13 DIAGNOSIS — R293 Abnormal posture: Secondary | ICD-10-CM | POA: Diagnosis present

## 2021-10-13 DIAGNOSIS — M6281 Muscle weakness (generalized): Secondary | ICD-10-CM | POA: Diagnosis present

## 2021-10-13 DIAGNOSIS — M542 Cervicalgia: Secondary | ICD-10-CM

## 2021-10-13 DIAGNOSIS — M25551 Pain in right hip: Secondary | ICD-10-CM

## 2021-10-13 NOTE — Therapy (Signed)
?OUTPATIENT PHYSICAL THERAPY LOWER EXTREMITY EVALUATION ? ? ?Patient Name: Shiri Hodapp ?MRN: [id number] ?DOB:1996/05/10, 26 y.o., female ?Today's Date: 10/14/2021 ? ? PT End of Session - 10/14/21 1044   ? ? Visit Number 1   ? Date for PT Re-Evaluation 12/09/21   ? Authorization Type Cign[employer]   ? PT Start Time 1345   ? PT Stop Time 1430   ? PT Time Calculation (min) 45 min   ? ?  ?  ? ?  ? ? ?Past Medical History:  ?Diagnosis Date  ? Allergic rhinitis   ? Allergy   ? SEASONAL  ? Asthma   ? Eczema   ? Environmental allergies   ? Age 89 years  ? Fatty liver 12/11/2018  ? Noted on CT 6/20  ? Generalized headaches   ? Began at age 39 years  ? GERD (gastroesophageal reflux disease)   ? Influenza   ? Age 75 years  ? 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 Taking Rifampin since 08/2015  ? Streptococcal infection(041.00)   ? Age 11 and 26 years old  ? 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 Diffuse enlargement on [place] 6/20  ? ?Past Surgical History:  ?Procedure Laterality Date  ? TONSILLECTOMY  2012  ? WISDOM TOOTH EXTRACTION    ? ?Patient Active Problem List  ? Diagnosis Date Noted  ? Pain in left hip 01/27/2021  ? Bruising 01/27/2021  ? Left-sided chest wall pain 03/23/2020  ? Positive ANA (antinuclear antibody) 03/23/2020  ? Asthma 02/10/2020  ? Allergic rhinitis 02/10/2020  ? GERD (gastroesophageal reflux disease) 02/10/2020  ? History of TB (tuberculosis) 08/21/2019  ? Throat pain 01/28/2019  ? Odynophagia 01/28/2019  ? Neck pain 01/28/2019  ? Thyroiditis 01/12/2019  ? 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 Fatty liver 12/11/2018  ? PCOS (polycystic ovarian syndrome) 11/11/2018  ? Seasonal allergies 09/22/2018  ? 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 Migraine with aura and without status migrainosus, not intractable 09/11/2012  ? Variants of migraine, not elsewhere classified, without mention of intractable migraine without mention of status migrainosus 09/11/2012  ? Unspecified constipation 09/11/2012  ? Syncope 08/31/2010  ? Neck Pain  08/31/2010  ? Insomnia 08/31/2010  ? ? ?PCP: Deeann Saint, MD ? ?REFERRING PROVIDER: Deeann Saint, MD ? ?THERAPY DIAG:  ?Pain in left hip ? ?Neck pain ? ?Muscle weakness (generalized) ? ?Abnormal posture ? ?Pain in right hip ? ?REFERRING DIAG: Cervicalgia [M54.2], Bilateral hip pain [M25.551, M25.552] ? ?SUBJECTIVE: ? ?PERTINENT PAST HISTORY:  ?ANA + with no specific auto immune disease diagnosis, multiple bouts of PT minimally effective, thyromegaly, latent TB ?     ?PRECAUTIONS: None ? ?WEIGHT BEARING RESTRICTIONS No ? ?FALLS:  ?Has patient fallen in last 6 months? Yes, Number of falls: spilled on liquid on floor ? ?LIVING ENVIRONMENT: ?Lives with: lives with their spouse ?Stairs: Yes; Internal: 2 flights steps; can reach both ? ?MOI/History of condition: ? ?Onset date: > 1 year ? ?Katlin C Hayden Rasmussen is a 26 y.o. female who presents to clinic with chief complaint of hip pain L > R.  L>R hip hip pain, mostly anterior, lateral, posterior.  Denies deep groin pain.  Some popping.  Denies clicking or locking.  She is also having some midline neck pain to around T2 from C1 along with muscular tightness which she thinks may be contributing to her neck pain.  History of HA and migraines.  She has tried some general hip strengthening  in the past which was helpful, but she was unable to continue d/t her schedule.  TDN has helped in the past.  She was referred out to rheumatology and found to be ANA +, but no specific rheumatic disease was ruled in.  2 previous bouts of PT. ? ?From referring provider:  ? ?"HPI ?Patient was seen today for follow-up on ongoing concerns.  Patient requesting referrals for PT and Ortho.  Previously seen by Dr. Prince RomeHilts for left hip pain.  MRI recommended, however not done.  Now with bilateral hip pain left greater than right from pelvis to mid thigh.  Endorses stiffness, popping, "stuck feeling" in hip.  Pelvis is achy/sore, discomfort is not sharp.  Pt is a side sleeper, having  difficulty getting comfortable at night.  Recently had an cramp/spasm in right hip after picking up/holding her son that lasted hours.  Also with midline posterior neck pain thought 2/2 posture per PT.  Knots in shoulders noted.  Pt does more sitting with current job.  Tries to walk around the building during breaks.  Denies radiation of pain or numbness/tingling into b/l UEs.  Migraines stable, followed by Neurology.  Increased muscle tension was causing some of the HAs." ? ? Red flags:  ?denies  ? ?Pain:  ?Are you having pain? Yes ?Pain location: neck pain central to t2 and bil UT/LS ?NPRS scale:  ?current 5/10  ?average 8/10  ?Aggravating factors: prolonged positions at work on computer, bending, lifting sleeping "wrong" ?Relieving factors: rest ?Pain description: intermittent, constant, aching, and tight ?Stage: Chronic ?Stability: staying the same ?24 hour pattern: NA  ? ?Are you having pain? Yes ?Pain location: mostly lateral hips, extending to anterior and posterior L>R ?NPRS scale:  ?current 8/10  ?average 8/10  ?Max: 10/10 ?Aggravating factors: sitting long periods, turning while sitting, running ?Relieving factors: rest, TDN ?Pain description: constant and aching ?Stage: Chronic ?Stability: staying the same ?24 hour pattern: after activity  ? ?Occupation: Psychologist, sport and exercisefront desk and EMT ? ?Assistive Device: NA ? ?Hand Dominance: NA ? ?Patient Goals/Specific Activities: reduce pain, improve strength ? ? ?PLOF: Independent ? ?DIAGNOSTIC FINDINGS: NA ? ? ?OBJECTIVE:  ? ? GENERAL OBSERVATION/GAIT: ?  R shoulder and R hip depressed compared to L in gait, antalgic gait; tremor with exercise ? ?SENSATION: ? Light touch: Appears intact ? ?PALPATION: ?Exquisite tenderness GT L, TTP TFL L ? ? ?LE MMT: ? ?MMT Right ?10/14/2021 Left ?10/14/2021  ?Hip flexion (L2, L3) 4 3+  ?Knee extension (L3) 4 3+  ?Knee flexion 4 4  ?Hip abduction 3+ 3-**  ?Hip extension    ?Hip external rotation 4 3+  ?Hip internal rotation 4 4  ?Hip adduction     ?Ankle dorsiflexion (L4)    ?Ankle plantarflexion (S1)    ?Ankle inversion    ?Ankle eversion    ?Great Toe ext (L5)    ?Grossly    ? ?(Blank rows = not tested, score listed is out of 5 possible points.  N = WNL, D = diminished, C = clear for gross weakness with myotome testing, * = concordant pain with testing) ? ?LE ROM: ? ?ROM Right ?10/14/2021 Left ?10/14/2021  ?Hip flexion 90 * 100*  ?Hip extension N N  ?Hip abduction    ?Hip adduction    ?Hip internal rotation reduced Reduced   ?Hip external rotation WNL* WNL*  ?Knee flexion    ?Knee extension    ?Ankle dorsiflexion    ?Ankle plantarflexion    ?Ankle inversion    ?  Ankle eversion    ? ?(Blank rows = not tested, N = WNL, * = concordant pain with testing) ? ?Functional Tests ? ?Eval (10/14/2021)    ?    ?    ?    ?    ?    ?    ?    ?    ?    ?    ?    ?    ?    ?    ? ? ?LOWER EXTREMITY SPECIAL TESTS:  ?Fadir (+), faber (+) L ?Fadir (+), faber (+) R  ? ? ?PATIENT SURVEYS:  ?FOTO take next visit ? ? ?TODAY'S TREATMENT: ?Creating, reviewing, and completing below HEP ? ? ?PATIENT EDUCATION:  ?POC, diagnosis, prognosis, HEP, and outcome measures.  Pt educated via explanation, demonstration, and handout (HEP).  Pt confirms understanding verbally.  ? ?ASTERISK SIGNS ? ? ?Asterisk Signs Eval (10/14/2021)       ?        ?        ?        ?        ?        ? ? ?HOME EXERCISE PROGRAM: ?Access Code: [email or web address] ?URL: [email or web address] ?Date: 10/13/2021 ?Prepared by: Alphonzo Severance ? ?Exercises ?- Hooklying Isometric Clamshell  - 1 x daily - 7 x weekly - 3 sets - 10 reps ?- Supine Bridge  - 1 x daily - 7 x weekly - 3 sets - 10 reps ? ?ASSESSMENT: ? ?CLINICAL IMPRESSION: ?Irelyn is a 26 y.o. female who presents to clinic with signs and sxs consistent with severe hip pain L >R.  Neck pain also present but deferred today d/t time.  She has clear signs of GTPS with concurrent intraarticular hip pathology signs.  She has been worked up to a certain extent in  rheumatology to rule out lupus, but is ANA (+).  She has had no imaging and I encouraged her to proceed with MRI after seeing Ortho on 5/4 to rule out more serious pathology like AVN given the chronic and recalcitran

## 2021-10-19 ENCOUNTER — Ambulatory Visit: Payer: Managed Care, Other (non HMO) | Admitting: Physician Assistant

## 2021-10-20 ENCOUNTER — Ambulatory Visit: Payer: Managed Care, Other (non HMO) | Admitting: Family Medicine

## 2021-10-20 ENCOUNTER — Ambulatory Visit (INDEPENDENT_AMBULATORY_CARE_PROVIDER_SITE_OTHER): Payer: Commercial Managed Care - HMO | Admitting: Physician Assistant

## 2021-10-20 ENCOUNTER — Ambulatory Visit (INDEPENDENT_AMBULATORY_CARE_PROVIDER_SITE_OTHER): Payer: Commercial Managed Care - HMO

## 2021-10-20 ENCOUNTER — Ambulatory Visit: Payer: Commercial Managed Care - HMO | Admitting: Physician Assistant

## 2021-10-20 DIAGNOSIS — M2550 Pain in unspecified joint: Secondary | ICD-10-CM

## 2021-10-20 DIAGNOSIS — M545 Low back pain, unspecified: Secondary | ICD-10-CM | POA: Diagnosis not present

## 2021-10-20 DIAGNOSIS — G8929 Other chronic pain: Secondary | ICD-10-CM

## 2021-10-20 NOTE — Progress Notes (Signed)
? ?Office Visit Note ?  ?Patient: Maria Smith           ?Date of Birth: [birth year]           ?MRN: [id number] ?Visit Date: 10/20/2021 ?             ?Requested by: Billie Ruddy, MD ?[address] ?[place],  [place] [address] ?PCP: Billie Ruddy, MD ? ?Chief Complaint  ?Patient presents with  ? Right Hip - Pain  ? Left Hip - Pain  ? Neck - Pain  ? ? ? ? ?HPI: ?The patient is a pleasant 26 year old woman who presents in follow-up today for bilateral posterior back pain.  She also gets charley horses going into her legs.  She had been followed by Dr. Junius Roads in a year ago he wanted to order an MRI because he was concerned about foraminal stenosis given her symptoms.  She has not had any x-rays of her back.  He also did draw an inflammatory panel as well as a vitamin D and was to have those repeated.  Her ANA did come back positive and she was referred to rheumatology to Dr. Benjamine Mola who did not think she had any type of autoimmune disease.  She does not know a lot about her family history.  She has been doing physical therapy but has not improved.  She would like to now discuss further evaluation as she has failed physical therapy and she still continues to have pain in her posterior buttock and hip.  She denies any groin pain. ? ? ?Assessment & Plan: ?Visit Diagnoses:  ?1. Multiple joint pain   ?2. Chronic bilateral low back pain, unspecified whether sciatica present   ? ? ?Plan: We will go forward with an MRI of her lumbar spine.  She will continue with physical therapy.  We will also redraw her inflammatory labs and vitamin D.  She really would like to get to the bottom of this.  She will follow-up once the MRI is completed with Dr. Durward Fortes ? ?Follow-Up Instructions: No follow-ups on file.  ? ?Ortho Exam ? ?Patient is alert, oriented, no adenopathy, well-dressed, normal affect, normal respiratory effort. ?Examination of her low back she has pain bilaterally over the lower back.  She has 5 out of 5  strength with dorsiflexion plantarflexion of her ankles resisted flexion and extension of her legs flexion of her hips.  She does have good sensation. ? ?Imaging: ?XR Lumbar Spine 2-3 Views ? ?Result Date: 10/20/2021 ?2 views of the lumbar spine were obtained today.  She has well-preserved joint spaces without any listhesis very slight curvature no acute fractures  ?No images are attached to the encounter. ? ?Labs: ?Lab Results  ?Component Value Date  ? HGBA1C 5.2 07/06/2021  ? ESRSEDRATE 2 02/05/2020  ? ESRSEDRATE 4 12/30/2018  ? CRP 0.5 02/05/2020  ? CRP 1 12/30/2018  ? REPTSTATUS 01/20/2014 FINAL 01/17/2014  ? CULT  01/17/2014  ?  No Herpes Simplex Virus detected. ?Performed at [facility]  ? ? ? ?Lab Results  ?Component Value Date  ? ALBUMIN 4.1 09/19/2020  ? ALBUMIN 4.1 04/16/2020  ? ALBUMIN 4.0 02/22/2020  ? ? ?Lab Results  ?Component Value Date  ? MG 2.1 06/20/2016  ? ?Lab Results  ?Component Value Date  ? VD25OH 30 11/02/2020  ? ? ?No results found for: PREALBUMIN ? ?  Latest Ref Rng & Units 09/19/2020  ?  1:38 PM 04/16/2020  ?  3:15  AM 02/22/2020  ?  8:05 AM  ?CBC EXTENDED  ?WBC 4.0 - 10.5 K/uL 3.4   6.9   3.2    ?RBC 3.87 - 5.11 Mil/uL 4.55   4.48   4.56    ?Hemoglobin 12.0 - 15.0 g/dL 13.2   12.9   13.3    ?HCT 36.0 - 46.0 % 39.3   39.0   38.3    ?Platelets 150.0 - 400.0 K/uL 250.0   298   283    ?NEUT# 1.4 - 7.7 K/uL 1.6      ?Lymph# 0.7 - 4.0 K/uL 1.1      ? ? ? ?There is no height or weight on file to calculate BMI. ? ?Orders:  ?Orders Placed This Encounter  ?Procedures  ? XR Lumbar Spine 2-3 Views  ? MR Lumbar Spine w/o contrast  ? Sed Rate (ESR)  ? Vitamin D (25 hydroxy)  ? ANA  ? Rheumatoid Factor  ? Cyclic citrul peptide antibody, IgG  ? Antinuclear Antib (ANA)  ? ?No orders of the defined types were placed in this encounter. ? ? ? Procedures: ?No procedures performed ? ?Clinical Data: ?No additional findings. ? ?ROS: ? ?All other systems negative, except as noted in the HPI. ?Review of  Systems ? ?Objective: ?Vital Signs: There were no vitals taken for this visit. ? ?Specialty Comments:  ?No specialty comments available. ? ?PMFS History: ?Patient Active Problem List  ? Diagnosis Date Noted  ? Pain in left hip 01/27/2021  ? Bruising 01/27/2021  ? Left-sided chest wall pain 03/23/2020  ? Positive ANA (antinuclear antibody) 03/23/2020  ? Asthma 02/10/2020  ? Allergic rhinitis 02/10/2020  ? GERD (gastroesophageal reflux disease) 02/10/2020  ? History of TB (tuberculosis) 08/21/2019  ? Throat pain 01/28/2019  ? Odynophagia 01/28/2019  ? Neck pain 01/28/2019  ? Thyroiditis 01/12/2019  ? 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 Fatty liver 12/11/2018  ? PCOS (polycystic ovarian syndrome) 11/11/2018  ? Seasonal allergies 09/22/2018  ? 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 Migraine with aura and without status migrainosus, not intractable 09/11/2012  ? Variants of migraine, not elsewhere classified, without mention of intractable migraine without mention of status migrainosus 09/11/2012  ? Unspecified constipation 09/11/2012  ? Syncope 08/31/2010  ? Neck Pain 08/31/2010  ? Insomnia 08/31/2010  ? ?Past Medical History:  ?Diagnosis Date  ? Allergic rhinitis   ? Allergy   ? SEASONAL  ? Asthma   ? Eczema   ? Environmental allergies   ? Age 17 years  ? Fatty liver 12/11/2018  ? Noted on CT 6/20  ? Generalized headaches   ? Began at age 37 years  ? GERD (gastroesophageal reflux disease)   ? Influenza   ? Age 66 years  ? 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 Taking Rifampin since 08/2015  ? Streptococcal infection(041.00)   ? Age 28 and 26 years old  ? 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 Diffuse enlargement on [place] 6/20  ?  ?Family History  ?Problem Relation Age of Onset  ? Migraines Paternal Grandmother   ? Migraines Paternal Grandfather   ? HIV Father   ? Diabetes Mother   ? Hypertension Mother   ? Anemia Mother   ? COPD Mother   ? CVA Mother   ? Congestive Heart Failure Mother   ? Cirrhosis Mother   ? Obesity Mother   ? Irritable bowel  syndrome Mother   ? Rheum arthritis Mother   ? Ulcers Brother   ?  Anemia Sister   ? Cholecystitis Brother   ? Migraines Paternal Aunt   ? Migraines Maternal Uncle   ?     Maternal Great Uncle  ? Diabetes Other   ?     Family History  ? Hypertension Other   ?     Family History  ? Depression Other   ?     Family History  ? Asthma Other   ?     Family History  ? Allergic rhinitis Other   ?     Family History  ? Asthma Brother   ? Colon cancer Neg Hx   ? Colon polyps Neg Hx   ? Esophageal cancer Neg Hx   ? Rectal cancer Neg Hx   ? Stomach cancer Neg Hx   ?  ?Past Surgical History:  ?Procedure Laterality Date  ? TONSILLECTOMY  2012  ? WISDOM TOOTH EXTRACTION    ? ?Social History  ? ?Occupational History  ? Occupation: dietary  ?Tobacco Use  ? Smoking status: Never  ? Smokeless tobacco: Never  ?Vaping Use  ? Vaping Use: Never used  ?Substance and Sexual Activity  ? Alcohol use: Not Currently  ? Drug use: No  ? Sexual activity: Yes  ?  Partners: Female  ?  Comment: female partner  ? ? ? ? ? ?

## 2021-10-21 ENCOUNTER — Ambulatory Visit: Payer: Managed Care, Other (non HMO) | Admitting: Physical Therapy

## 2021-10-23 LAB — CYCLIC CITRUL PEPTIDE ANTIBODY, IGG: Cyclic Citrullin Peptide Ab: <16 UNITS

## 2021-10-23 LAB — ANTI-NUCLEAR AB-TITER (ANA TITER): ANA Titer 1: 1:40 {titer} — ABNORMAL HIGH

## 2021-10-23 LAB — VITAMIN D 25 HYDROXY (VIT D DEFICIENCY, FRACTURES): Vit D, 25-Hydroxy: 36 ng/mL (ref 30–100)

## 2021-10-23 LAB — SEDIMENTATION RATE: Sed Rate: 6 mm/h (ref 0–20)

## 2021-10-23 LAB — ANA: Anti Nuclear Antibody (ANA): POSITIVE — AB

## 2021-10-23 LAB — RHEUMATOID FACTOR: Rheumatoid fact SerPl-aCnc: <14 IU/mL (ref <14)

## 2021-10-24 ENCOUNTER — Encounter: Payer: Self-pay | Admitting: Physical Therapy

## 2021-10-24 ENCOUNTER — Ambulatory Visit: Payer: Commercial Managed Care - HMO | Attending: Family Medicine | Admitting: Physical Therapy

## 2021-10-24 ENCOUNTER — Other Ambulatory Visit: Payer: Self-pay

## 2021-10-24 DIAGNOSIS — M542 Cervicalgia: Secondary | ICD-10-CM

## 2021-10-24 DIAGNOSIS — M25552 Pain in left hip: Secondary | ICD-10-CM

## 2021-10-24 DIAGNOSIS — M25551 Pain in right hip: Secondary | ICD-10-CM | POA: Diagnosis present

## 2021-10-24 DIAGNOSIS — R293 Abnormal posture: Secondary | ICD-10-CM | POA: Diagnosis present

## 2021-10-24 DIAGNOSIS — M6281 Muscle weakness (generalized): Secondary | ICD-10-CM | POA: Diagnosis present

## 2021-10-24 NOTE — Patient Instructions (Signed)
Access Code: [email or web address] ?URL: [email or web address] ?Date: 10/24/2021 ?Prepared by: Rosana Hoes ? ?Exercises ?- Hooklying Isometric Clamshell  - 1-2 x daily - 2 sets - 10 reps - 3-5 seconds hold ?- Bridge  - 1-2 x daily - 2 sets - 10 reps - 3-5 seconds hold ?- Supine Hip Adduction Isometric with Ball  - 1-2 x daily - 2 sets - 10 reps - 3-5 seconds hold ?- Hooklying Isometric Hip Flexion  - 1-2 x daily - 2 sets - 10 reps - 3-5 seconds hold ?

## 2021-10-24 NOTE — Therapy (Signed)
?OUTPATIENT PHYSICAL THERAPY TREATMENT NOTE ? ? ?Patient Name: Maria Smith ?MRN: [id number] ?DOB:1996-02-07, 26 y.o., female ?Today's Date: 10/24/2021 ? ?PCP: Billie Ruddy, MD ?  ?REFERRING PROVIDER: Billie Ruddy, MD ? ?END OF SESSION:  ? PT End of Session - 10/24/21 1315   ? ? Visit Number 2   ? Number of Visits 17   ? Date for PT Re-Evaluation 12/09/21   ? Authorization Type Cigna   ? PT Start Time 1215   ? PT Stop Time 1258   ? PT Time Calculation (min) 43 min   ? Activity Tolerance Patient limited by pain   ? Behavior During Therapy [facility] for tasks assessed/performed   ? ?  ?  ? ?  ? ? ?Past Medical History:  ?Diagnosis Date  ? Allergic rhinitis   ? Allergy   ? SEASONAL  ? Asthma   ? Eczema   ? Environmental allergies   ? Age 20 years  ? Fatty liver 12/11/2018  ? Noted on CT 6/20  ? Generalized headaches   ? Began at age 89 years  ? GERD (gastroesophageal reflux disease)   ? Influenza   ? Age 35 years  ? 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 Taking Rifampin since 08/2015  ? Streptococcal infection(041.00)   ? Age 54 and 26 years old  ? 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 Diffuse enlargement on [place] 6/20  ? ?Past Surgical History:  ?Procedure Laterality Date  ? TONSILLECTOMY  2012  ? WISDOM TOOTH EXTRACTION    ? ?Patient Active Problem List  ? Diagnosis Date Noted  ? Pain in left hip 01/27/2021  ? Bruising 01/27/2021  ? Left-sided chest wall pain 03/23/2020  ? Positive ANA (antinuclear antibody) 03/23/2020  ? Asthma 02/10/2020  ? Allergic rhinitis 02/10/2020  ? GERD (gastroesophageal reflux disease) 02/10/2020  ? History of TB (tuberculosis) 08/21/2019  ? Throat pain 01/28/2019  ? Odynophagia 01/28/2019  ? Neck pain 01/28/2019  ? Thyroiditis 01/12/2019  ? 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 Fatty liver 12/11/2018  ? PCOS (polycystic ovarian syndrome) 11/11/2018  ? Seasonal allergies 09/22/2018  ? 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 Migraine with aura and without status migrainosus, not intractable 09/11/2012  ?  Variants of migraine, not elsewhere classified, without mention of intractable migraine without mention of status migrainosus 09/11/2012  ? Unspecified constipation 09/11/2012  ? Syncope 08/31/2010  ? Neck Pain 08/31/2010  ? Insomnia 08/31/2010  ? ? ?REFERRING DIAG: Cervicalgia, Bilateral hip pain  ? ?THERAPY DIAG:  ?Pain in left hip ? ?Neck pain ? ?Muscle weakness (generalized) ? ?Abnormal posture ? ?Pain in right hip ? ?PERTINENT HISTORY: ANA + with no specific auto immune disease diagnosis, multiple bouts of PT minimally effective, thyromegaly, latent TB ? ?PRECAUTIONS: None ? ?SUBJECTIVE: Patient reports left hip is bothering her, she went to the beach this weekend and she did a lot of carrying bags and so is very sore today. Patient also notes bad left knee that is giving her problems due to walking a lot recently. ? ?PAIN:  ?Are you having pain? Yes ?Pain location: mostly lateral hips, extending to anterior and posterior L>R ?NPRS scale:  ?current "12/10" ?average 8/10  ?Max: 10/10 ?Aggravating factors: sitting long periods, turning while sitting, running ?Relieving factors: rest, TDN ?Pain description: constant and aching ?Stage: Chronic ?Stability: staying the same ?24 hour pattern: after activity  ? ?Are you having pain? Yes ?Pain location: neck pain central to t2  and bil UT/LS ?NPRS scale:  ?current 0/10  ?average 8/10  ?Aggravating factors: prolonged positions at work on computer, bending, lifting sleeping "wrong" ?Relieving factors: rest ?Pain description: intermittent, constant, aching, and tight ?Stage: Chronic ?Stability: staying the same ?24 hour pattern: NA  ?  ?Patient Goals/Specific Activities: reduce pain, improve strength ? ? ?OBJECTIVE: (objective measures completed at initial evaluation unless otherwise dated) ?  GENERAL OBSERVATION/GAIT: ?R shoulder and R hip depressed compared to L in gait, antalgic gait; tremor with exercise ?  ?SENSATION: ?         Light touch: Appears intact ?   ?PALPATION: ?Exquisite tenderness GT L, TTP TFL L ?  ?  LE MMT: ?  ?MMT Right ?10/14/2021 Left ?10/14/2021  ?Hip flexion (L2, L3) 4 3+  ?Knee extension (L3) 4 3+  ?Knee flexion 4 4  ?Hip abduction 3+ 3-**  ?Hip extension      ?Hip external rotation 4 3+  ?Hip internal rotation 4 4  ?Hip adduction      ?Ankle dorsiflexion (L4)      ?Ankle plantarflexion (S1)      ?Ankle inversion      ?Ankle eversion      ?Great Toe ext (L5)      ?Grossly      ?  ?(Blank rows = not tested, score listed is out of 5 possible points.  N = WNL, D = diminished, C = clear for gross weakness with myotome testing, * = concordant pain with testing) ?  ?LE ROM: ?  ?ROM Right ?10/14/2021 Left ?10/14/2021  ?Hip flexion 90 * 100*  ?Hip extension N N  ?Hip abduction      ?Hip adduction      ?Hip internal rotation reduced Reduced   ?Hip external rotation WNL* WNL*  ?Knee flexion      ?Knee extension      ?Ankle dorsiflexion      ?Ankle plantarflexion      ?Ankle inversion      ?Ankle eversion      ? ?(Blank rows = not tested, N = WNL, * = concordant pain with testing) ?  ?Functional Tests ?  ?Eval (10/14/2021)      ?       ?       ?       ?       ?       ?       ?       ?       ?       ?       ?       ?       ?       ?       ?  ?LOWER EXTREMITY SPECIAL TESTS:  ?Fadir (+), faber (+) L ?Fadir (+), faber (+) R  ?  ?  ?PATIENT SURVEYS:  ?FOTO take next visit ?  ?  ?TODAY'S TREATMENT: ?[facility] Adult PT Treatment:                                                DATE: 10/24/2021 ?Therapeutic Exercise: ?Recumbent bike L1 x 5 min while taking subjective ?Hooklying hip flexion isometric 2 x 10 with 3 sec hold ?Hooklying adduction ball squeeze isometric 2 x 10  with 3 sec hold ?Hooklying  hip abduction isometric 2 x 10  with 3 sec hold ?Bridge partial range 2 x 10 ?Trial supine SLR and sidelying hip abduction but patient unable to tolerate ?Manual Therapy: ?LAD for LLE x 5 bouts for pain relief ?Modalities: ?Cold pack x 5 min post session ? ?PATIENT EDUCATION:  ?HEP  update.  Pt educated via explanation, demonstration, and handout (HEP).  Pt confirms understanding verbally.  ?  ?ASTERISK SIGNS ?  ?Asterisk Signs Eval (10/14/2021)            ?               ?               ?               ?               ?               ?  ?HOME EXERCISE PROGRAM: ?Access Code: [email or web address] ? ?  ?ASSESSMENT: ?CLINICAL IMPRESSION: ?Patient with poor tolerance for therapy due to high levels of left hip pain, no adverse effects reported with therapy. Patient only able to tolerate light hip isometrics. Trialed gentle LAD but patient reported continued hip pain with minimal relief, unable to tolerate manual therapy. Used cold pack post therapy for pain control and patient did report improvement in symptoms. Still unclear etiology of hip pain, she had lumbar x-ray recently that was negative and is supposed to be scheduled for MRI. Patient would benefit from continued skilled PT to progress mobility and strength in order to reduce pain and maximize functional ability.  ? ?  ?OBJECTIVE IMPAIRMENTS: Pain, hip ROM, hip and LE strength, gait, posture ?  ?ACTIVITY LIMITATIONS: sitting, standing, walking, housework, child care ?  ?PERSONAL FACTORS: See medical history and pertinent history ?  ?  ?GOALS: ?  ?SHORT TERM GOALS: ?  ?Kathrin will be >75% HEP compliant to improve carryover between sessions and facilitate independent management of condition ?  ?Evaluation (10/14/2021): ongoing ?Target date: 11/04/2021 ?Goal status: INITIAL ?  ?  ?LONG TERM GOALS: ?  ?Elyse will self report >/= 50% decrease in hip pain from evaluation  ?  ?Evaluation/Baseline (10/14/2021): 10/10 max pain ?Target date: 12/09/2021 ?Goal status: INITIAL ?  ?  ?2.  Burnis will be able to go the park and play with her children, not limited by pain ?  ?Evaluation/Baseline (10/14/2021): limited ?Target date: 12/09/2021 ?Goal status: INITIAL ?  ?  ?  ?3.  Adelei will improve the following MMTs to >/= 4/5 to show improvement in strength:  see  chart  ?  ?Evaluation/Baseline (10/14/2021): see chart in note ?Target date: 12/09/2021 ?Goal status: INITIAL ?  ?  ?4.  FOTO goal ?  ?  ?5.  Neck goal ?  ?  ?6.  Neck goal ?  ?  ?PLAN: ?PT FREQUENCY: 1-2x/week ?  ?PT DURATIO

## 2021-10-26 ENCOUNTER — Other Ambulatory Visit: Payer: Self-pay | Admitting: Physician Assistant

## 2021-10-26 ENCOUNTER — Ambulatory Visit: Payer: Commercial Managed Care - HMO

## 2021-10-26 ENCOUNTER — Other Ambulatory Visit: Payer: Self-pay | Admitting: *Deleted

## 2021-10-26 ENCOUNTER — Telehealth: Payer: Self-pay | Admitting: *Deleted

## 2021-10-26 ENCOUNTER — Ambulatory Visit: Payer: Self-pay

## 2021-10-26 DIAGNOSIS — M25552 Pain in left hip: Secondary | ICD-10-CM

## 2021-10-26 DIAGNOSIS — G8929 Other chronic pain: Secondary | ICD-10-CM

## 2021-10-26 NOTE — Telephone Encounter (Signed)
Orders have been placed and pt called to come in as nurse visit for xr hip ?

## 2021-10-26 NOTE — Telephone Encounter (Signed)
Pt called stating she had received call from imaging to schedule MRI of Lumbar spine, pt states she isnt having issues with the lumbar spine it is her Left hip which radiates to her buttucks/sciatica, charlie horse sensation is coming from the hip, she gets cramps and locking. Pt is doing Physical therapy and states when she is done with therapy she has trouble walking.  ? ?Pt wants to have MRI of Left hip instead of the Lumbar spine, says main reason why she came in was to discuss the hip.  ? ?Please advise.  ?

## 2021-10-27 ENCOUNTER — Ambulatory Visit: Payer: Commercial Managed Care - HMO

## 2021-10-27 ENCOUNTER — Other Ambulatory Visit: Payer: Self-pay

## 2021-10-27 ENCOUNTER — Ambulatory Visit: Payer: Self-pay

## 2021-10-27 ENCOUNTER — Other Ambulatory Visit: Payer: Self-pay | Admitting: Family Medicine

## 2021-10-27 DIAGNOSIS — M25552 Pain in left hip: Secondary | ICD-10-CM

## 2021-10-27 DIAGNOSIS — J302 Other seasonal allergic rhinitis: Secondary | ICD-10-CM

## 2021-10-27 NOTE — Therapy (Signed)
?OUTPATIENT PHYSICAL THERAPY TREATMENT NOTE ? ? ?Patient Name: Maria Smith ?MRN: [id number] ?DOB:01/16/1996, 26 y.o., female ?Today's Date: 10/28/2021 ? ?PCP: Billie Ruddy, MD ?  ?REFERRING PROVIDER: Billie Ruddy, MD ? ?END OF SESSION:  ? PT End of Session - 10/28/21 [id number]   ? ? Visit Number 3   ? Number of Visits 17   ? Date for PT Re-Evaluation 12/09/21   ? Authorization Type [employer]ign[employer]   ? PT Start Time 0815   ? PT Stop Time 0857   ? PT Time Calculation (min) 42 min   ? Activity Tolerance Patient limited by pain   ? Behavior During Therapy [facility] for tasks assessed/performed   ? ?  ?  ? ?  ? ? ? ?Past Medical History:  ?Diagnosis Date  ? Allergic rhinitis   ? Allergy   ? SEASONAL  ? Asthma   ? Eczema   ? Environmental allergies   ? Age 33 years  ? Fatty liver 12/11/2018  ? Noted on CT 6/20  ? Generalized headaches   ? Began at age 37 years  ? GERD (gastroesophageal reflux disease)   ? Influenza   ? Age 29 years  ? 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 Taking Rifampin since 08/2015  ? Streptococcal infection(041.00)   ? Age 68 and 26 years old  ? 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 Diffuse enlargement on [place] 6/20  ? ?Past Surgical History:  ?Procedure Laterality Date  ? TONSILLECTOMY  2012  ? WISDOM TOOTH EXTRACTION    ? ?Patient Active Problem List  ? Diagnosis Date Noted  ? Pain in left hip 01/27/2021  ? Bruising 01/27/2021  ? Left-sided chest wall pain 03/23/2020  ? Positive ANA (antinuclear antibody) 03/23/2020  ? Asthma 02/10/2020  ? Allergic rhinitis 02/10/2020  ? GERD (gastroesophageal reflux disease) 02/10/2020  ? History of TB (tuberculosis) 08/21/2019  ? Throat pain 01/28/2019  ? Odynophagia 01/28/2019  ? Neck pain 01/28/2019  ? Thyroiditis 01/12/2019  ? 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 Fatty liver 12/11/2018  ? PCOS (polycystic ovarian syndrome) 11/11/2018  ? Seasonal allergies 09/22/2018  ? 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 Migraine with aura and without status migrainosus, not intractable 09/11/2012  ?  Variants of migraine, not elsewhere classified, without mention of intractable migraine without mention of status migrainosus 09/11/2012  ? Unspecified constipation 09/11/2012  ? Syncope 08/31/2010  ? Neck Pain 08/31/2010  ? Insomnia 08/31/2010  ? ? ?REFERRING DIAG: Cervicalgia, Bilateral hip pain  ? ?THERAPY DIAG:  ?Pain in left hip ? ?Neck pain ? ?Muscle weakness (generalized) ? ?Abnormal posture ? ?PERTINENT HISTORY: ANA + with no specific auto immune disease diagnosis, multiple bouts of PT minimally effective, thyromegaly, latent TB ? ?PRECAUTIONS: None ? ?SUBJECTIVE: Pt reports continued increased Lt hip pain. She went to [facility] yesterday for Lt hip X-Ray, although the report is currently unavailable in Epic. She reports doing her HEP. ? ?PAIN:  ?Are you having pain? Yes ?Pain location: mostly lateral hips, extending to anterior and posterior L>R ?NPRS scale:  ?current 7/10 ?average 8/10  ?Max: 10/10 ?Aggravating factors: sitting long periods, turning while sitting, running ?Relieving factors: rest, TDN ?Pain description: constant and aching ?Stage: Chronic ?Stability: staying the same ?24 hour pattern: after activity  ? ?Are you having pain? Yes ?Pain location: neck pain central to t2 and bil UT/LS ?NPRS scale:  ?current 0/10  ?average 8/10  ?Aggravating factors: prolonged positions at work on computer,  bending, lifting sleeping "wrong" ?Relieving factors: rest ?Pain description: intermittent, constant, aching, and tight ?Stage: Chronic ?Stability: staying the same ?24 hour pattern: NA  ?  ?Patient Goals/Specific Activities: reduce pain, improve strength ? ? ?OBJECTIVE: (objective measures completed at initial evaluation unless otherwise dated) ?  GENERAL OBSERVATION/GAIT: ?R shoulder and R hip depressed compared to L in gait, antalgic gait; tremor with exercise ?  ?SENSATION: ?         Light touch: Appears intact ?  ?PALPATION: ?Exquisite tenderness GT L, TTP TFL L ?  ?  LE MMT: ?  ?MMT Right ?10/14/2021  Left ?10/14/2021  ?Hip flexion (L2, L3) 4 3+  ?Knee extension (L3) 4 3+  ?Knee flexion 4 4  ?Hip abduction 3+ 3-**  ?Hip extension      ?Hip external rotation 4 3+  ?Hip internal rotation 4 4  ?Hip adduction      ?Ankle dorsiflexion (L4)      ?Ankle plantarflexion (S1)      ?Ankle inversion      ?Ankle eversion      ?Great Toe ext (L5)      ?Grossly      ?  ?(Blank rows = not tested, score listed is out of 5 possible points.  N = WNL, D = diminished, C = clear for gross weakness with myotome testing, * = concordant pain with testing) ?  ?LE ROM: ?  ?ROM Right ?10/14/2021 Left ?10/14/2021  ?Hip flexion 90 * 100*  ?Hip extension N N  ?Hip abduction      ?Hip adduction      ?Hip internal rotation reduced Reduced   ?Hip external rotation WNL* WNL*  ?Knee flexion      ?Knee extension      ?Ankle dorsiflexion      ?Ankle plantarflexion      ?Ankle inversion      ?Ankle eversion      ? ?(Blank rows = not tested, N = WNL, * = concordant pain with testing) ?  ?Functional Tests ?  ?Eval (10/14/2021)      ?       ?       ?       ?       ?       ?       ?       ?       ?       ?       ?       ?       ?       ?       ?  ?LOWER EXTREMITY SPECIAL TESTS:  ?Fadir (+), faber (+) L ?Fadir (+), faber (+) R  ?  ?  ?PATIENT SURVEYS:  ?FOTO take next visit ?  ?  ?TODAY'S TREATMENT: ? ?[facility] PT Treatment:                                                DATE: 10/28/2021 ?Therapeutic Exercise: ?Standing IT band stretch x34min on Lt ?Thomas stretch x54min on Lt ?Standing hip extension with 7# cable 2x10 BIL ?Standing hamstring curls with 7# cable 2x10 BIL ?Standing hip abduction with 7# cable 2x10 on Lt ?Manual Therapy: ?N/A ?Neuromuscular re-ed: ?N/A ?Therapeutic Activity: ?Dead lift with 10# kettlebell 3x10 ?Modalities: ?N/A ?  Self Care: ?N/A ? ? ?[facility] Adult PT Treatment:                                                DATE: 10/24/2021 ?Therapeutic Exercise: ?Recumbent bike L1 x 5 min while taking subjective ?Hooklying hip flexion isometric 2 x 10  with 3 sec hold ?Hooklying adduction ball squeeze isometric 2 x 10  with 3 sec hold ?Hooklying hip abduction isometric 2 x 10  with 3 sec hold ?Bridge partial range 2 x 10 ?Trial supine SLR and sidelying hip abduction but patient unable to tolerate ?Manual Therapy: ?LAD for LLE x 5 bouts for pain relief ?Modalities: ?Cold pack x 5 min post session ? ?PATIENT EDUCATION:  ?HEP update.  Pt educated via explanation, demonstration, and handout (HEP).  Pt confirms understanding verbally.  ?  ?ASTERISK SIGNS ?  ?[facility] Signs Eval (10/14/2021)            ?               ?               ?               ?               ?               ?  ?HOME EXERCISE PROGRAM: ?Access Code: [id number] ? ?  ?ASSESSMENT: ?CLINICAL IMPRESSION: ?Pt responded well to all interventions today, demonstrating good form and no increase in pain with selected exercises. She reports a therapeutic response to stretches today. Pt will continue to benefit from skilled PT to address her primary impairments and return to her prior level of function with less limitation.  ? ?  ?OBJECTIVE IMPAIRMENTS: Pain, hip ROM, hip and LE strength, gait, posture ?  ?ACTIVITY LIMITATIONS: sitting, standing, walking, housework, child care ?  ?PERSONAL FACTORS: See medical history and pertinent history ?  ?  ?GOALS: ?  ?SHORT TERM GOALS: ?  ?Jakaria will be >75% HEP compliant to improve carryover between sessions and facilitate independent management of condition ?  ?Evaluation (10/14/2021): ongoing ?Target date: 11/04/2021 ?Goal status: INITIAL ?  ?  ?LONG TERM GOALS: ?  ?Aften will self report >/= 50% decrease in hip pain from evaluation  ?  ?Evaluation/Baseline (10/14/2021): 10/10 max pain ?Target date: 12/09/2021 ?Goal status: INITIAL ?  ?  ?2.  Marilynne will be able to go the park and play with her children, not limited by pain ?  ?Evaluation/Baseline (10/14/2021): limited ?Target date: 12/09/2021 ?Goal status: INITIAL ?  ?  ?  ?3.  Johnna will improve the following MMTs  to >/= 4/5 to show improvement in strength:  see chart  ?  ?Evaluation/Baseline (10/14/2021): see chart in note ?Target date: 12/09/2021 ?Goal status: INITIAL ?  ?  ?4.  FOTO goal ?  ?  ?5.  Neck goal ?  ?

## 2021-10-28 ENCOUNTER — Ambulatory Visit: Payer: Commercial Managed Care - HMO

## 2021-10-28 ENCOUNTER — Other Ambulatory Visit: Payer: Self-pay

## 2021-10-28 DIAGNOSIS — M542 Cervicalgia: Secondary | ICD-10-CM

## 2021-10-28 DIAGNOSIS — M6281 Muscle weakness (generalized): Secondary | ICD-10-CM

## 2021-10-28 DIAGNOSIS — M25552 Pain in left hip: Secondary | ICD-10-CM | POA: Diagnosis not present

## 2021-10-28 DIAGNOSIS — R293 Abnormal posture: Secondary | ICD-10-CM

## 2021-11-02 ENCOUNTER — Ambulatory Visit: Payer: Managed Care, Other (non HMO) | Admitting: Physical Therapy

## 2021-11-03 ENCOUNTER — Encounter: Payer: Self-pay | Admitting: Physician Assistant

## 2021-11-03 ENCOUNTER — Ambulatory Visit (INDEPENDENT_AMBULATORY_CARE_PROVIDER_SITE_OTHER): Payer: Commercial Managed Care - HMO | Admitting: Physician Assistant

## 2021-11-03 DIAGNOSIS — M25552 Pain in left hip: Secondary | ICD-10-CM

## 2021-11-03 NOTE — Progress Notes (Signed)
Office Visit Note   Patient: Maria Smith           Date of Birth: Sep 22, 1995           MRN: [id number] Visit Date: 11/03/2021              Requested by: Maria Smith, Maria R, MD [address] [place],  [place] [id number] PCP: Maria Smith, Maria R, MD  Chief Complaint  Patient presents with   Left Hip - Follow-up      HPI: [name] follows up today for her left hip pain.  She did come into the office for a left hip x-ray.  There were no obvious degenerative changes no evidence of subluxation the femoral head was reduced in the acetabulum.  She has been working with physical therapy and the pain in her left hip is continuing to get worse.  It is in the groin and wraps around to the back of her leg.  She is now having episodes of the hip locking up and is also having persistent more popping  Assessment & Plan: Visit Diagnoses: Left hip pain  Plan: We went over the results of her ANA which was positive as it has been.  She at some point would need referral to a rheumatologist.  We have an order an MRI and with her continued increasing pain she is having difficulty tolerating physical therapy.  She is now getting locking and catching sensations in the left hip.  I am concerned for a labral tear.  We will follow-up once this is complete  Follow-Up Instructions: No follow-ups on file.   Ortho Exam  Patient is alert, oriented, no adenopathy, well-dressed, normal affect, normal respiratory effort. Examination of her left hip she has pain in the groin and wraps around the posterior buttock does not really have any radicular findings.  She has a sense of popping with internal/external rotation of the hip  Imaging: Hip x-rays done demonstrate well-maintained alignment in the joint no evidence of any fracture No images are attached to the encounter.  Labs: Lab Results  Component Value Date   HGBA1C 5.2 07/06/2021   ESRSEDRATE 6 10/20/2021   ESRSEDRATE 2 02/05/2020    ESRSEDRATE 4 12/30/2018   CRP 0.5 02/05/2020   CRP 1 12/30/2018   REPTSTATUS 01/20/2014 FINAL 01/17/2014   CULT  01/17/2014    No Herpes Simplex Virus detected. Performed at [facility]     Lab Results  Component Value Date   ALBUMIN 4.1 09/19/2020   ALBUMIN 4.1 04/16/2020   ALBUMIN 4.0 02/22/2020    Lab Results  Component Value Date   MG 2.1 06/20/2016   Lab Results  Component Value Date   VD25OH 36 10/20/2021   VD25OH 30 11/02/2020    No results found for: PREALBUMIN    Latest Ref Rng & Units 09/19/2020    1:38 PM 04/16/2020    3:15 AM 02/22/2020    8:05 AM  CBC EXTENDED  WBC 4.0 - 10.5 K/uL 3.4   6.9   3.2    RBC 3.87 - 5.11 Mil/uL 4.55   4.48   4.56    Hemoglobin 12.0 - 15.0 g/dL 02.713.2   25.312.9   66.413.3    HCT 36.0 - 46.0 % 39.3   39.0   38.3    Platelets 150.0 - 400.0 K/uL 250.0   298   283    NEUT# 1.4 - 7.7 K/uL 1.6      Lymph#  0.7 - 4.0 K/uL 1.1         There is no height or weight on file to calculate BMI.  Orders:  No orders of the defined types were placed in this encounter.  No orders of the defined types were placed in this encounter.    Procedures: No procedures performed  Clinical Data: No additional findings.  ROS:  All other systems negative, except as noted in the HPI. Review of Systems  Objective: Vital Signs: There were no vitals taken for this visit.  Specialty Comments:  No specialty comments available.  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   Migraine with aura and without status migrainosus, not intractable 09/11/2012   Variants of migraine, not elsewhere classified, without mention of intractable migraine without mention of status migrainosus 09/11/2012   Unspecified constipation 09/11/2012   Syncope 08/31/2010   Neck Pain 08/31/2010   Insomnia 08/31/2010   Past Medical History:  Diagnosis Date   Allergic rhinitis    Allergy    SEASONAL   Asthma    Eczema    Environmental allergies    Age 46 years   Fatty liver 12/11/2018   Noted on CT 6/20   Generalized headaches    Began at age 36 years   GERD (gastroesophageal reflux disease)    Influenza    Age 4 years   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 Rifampin since 08/2015   Streptococcal infection(041.00)    Age 25 and 26 years old   TB lung, latent    2017   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/2018   Diffuse enlargement on [place] 6/20    Family History  Problem Relation Age of Onset   Migraines Paternal Grandmother    Migraines Paternal Grandfather    HIV Father    Diabetes Mother    Hypertension Mother    Anemia Mother    COPD Mother    CVA Mother    Congestive Heart Failure Mother    Cirrhosis Mother    Obesity Mother    Irritable bowel syndrome Mother    Rheum arthritis Mother    Ulcers Brother    Anemia Sister    Cholecystitis Brother    Migraines Paternal Aunt    Migraines Maternal Uncle        Maternal Great Uncle   Diabetes Other        Family History   Hypertension Other        Family History   Depression Other        Family History   Asthma Other        Family History   Allergic rhinitis Other        Family History   Asthma Brother    Colon cancer Neg Hx    Colon polyps Neg Hx    Esophageal cancer Neg Hx    Rectal cancer Neg Hx    Stomach cancer Neg Hx  Past Surgical History:  Procedure Laterality Date   TONSILLECTOMY  2012   WISDOM TOOTH EXTRACTION      Social History   Occupational History   Occupation: dietary  Tobacco Use   Smoking status: Never   Smokeless tobacco: Never  Vaping Use   Vaping Use: Never used  Substance and Sexual Activity   Alcohol use: Not Currently   Drug use: No   Sexual activity: Yes    Partners: Female    Comment: female partner

## 2021-11-03 NOTE — Therapy (Signed)
OUTPATIENT PHYSICAL THERAPY TREATMENT NOTE   Patient Name: Maria Smith MRN: [id number] DOB:1996/04/23, 26 y.o., female Today's Date: 11/04/2021  PCP: Deeann Saint, MD   REFERRING PROVIDER: Deeann Saint, MD  END OF SESSION:   PT End of Session - 11/04/21 0819     Visit Number 4    Number of Visits 17    Date for PT Re-Evaluation 12/09/21    Authorization Type [employer]    PT Start Time 0820    PT Stop Time 0858    PT Time Calculation (min) 38 min    Activity Tolerance Patient limited by pain    Behavior During Therapy [facility] for tasks assessed/performed               Past Medical History:  Diagnosis Date   Allergic rhinitis    Allergy    SEASONAL   Asthma    Eczema    Environmental allergies    Age 48 years   Fatty liver 12/11/2018   Noted on CT 6/20   Generalized headaches    Began at age 76 years   GERD (gastroesophageal reflux disease)    Influenza    Age 43 years   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 Rifampin since 08/2015   Streptococcal infection(041.00)    Age 49 and 26 years old   TB lung, latent    2017   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/2018   Diffuse enlargement on [place] 6/20   Past Surgical History:  Procedure Laterality Date   TONSILLECTOMY  2012   WISDOM TOOTH EXTRACTION     Patient Active Problem List   Diagnosis Date Noted   Pain in left hip 01/27/2021   Bruising 01/27/2021   Left-sided chest wall pain 03/23/2020   Positive ANA (antinuclear antibody) 03/23/2020   Asthma 02/10/2020   Allergic rhinitis 02/10/2020   GERD (gastroesophageal reflux disease) 02/10/2020   History of TB (tuberculosis) 08/21/2019   Throat pain 01/28/2019   Odynophagia 01/28/2019   Neck pain 01/28/2019   Thyroiditis 01/12/2019   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/2018   Fatty liver 12/11/2018   PCOS (polycystic ovarian syndrome) 11/11/2018   Seasonal allergies 09/22/2018   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   Migraine with aura and without status migrainosus, not intractable 09/11/2012    Variants of migraine, not elsewhere classified, without mention of intractable migraine without mention of status migrainosus 09/11/2012   Unspecified constipation 09/11/2012   Syncope 08/31/2010   Neck Pain 08/31/2010   Insomnia 08/31/2010    REFERRING DIAG: Cervicalgia, Bilateral hip pain   THERAPY DIAG:  Pain in left hip  Neck pain  Muscle weakness (generalized)  Abnormal posture  PERTINENT HISTORY: ANA + with no specific auto immune disease diagnosis, multiple bouts of PT minimally effective, thyromegaly, latent TB  PRECAUTIONS: None  SUBJECTIVE: Pt reports continued achy pain down the front of her thigh, originating at the proximal anterior hip. She had a follow-up yesterday with her referring provider, and she reports that the appointment went well. Her X-rays from earlier this week came back without concordant findings.  PAIN:  Are you having pain? Yes Pain location: mostly lateral hips, extending to anterior and posterior L>R NPRS scale:  current 7/10 average 8/10  Max: 10/10 Aggravating factors: sitting long periods, turning while sitting, running Relieving factors: rest, TDN Pain description: constant and aching Stage: Chronic Stability: staying the same 24 hour pattern: after activity   Are you having pain? Yes Pain location: neck pain central to t2 and bil UT/LS  NPRS scale:  current 0/10  average 8/10  Aggravating factors: prolonged positions at work on computer, bending, lifting sleeping "wrong" Relieving factors: rest Pain description: intermittent, constant, aching, and tight Stage: Chronic Stability: staying the same 24 hour pattern: NA    Patient Goals/Specific Activities: reduce pain, improve strength   OBJECTIVE: (objective measures completed at initial evaluation unless otherwise dated)   GENERAL OBSERVATION/GAIT: R shoulder and R hip depressed compared to L in gait, antalgic gait; tremor with exercise   SENSATION:          Light touch:  Appears intact   PALPATION: Exquisite tenderness GT L, TTP TFL L     LE MMT:   MMT Right 10/14/2021 Left 10/14/2021  Hip flexion (L2, L3) 4 3+  Knee extension (L3) 4 3+  Knee flexion 4 4  Hip abduction 3+ 3-**  Hip extension      Hip external rotation 4 3+  Hip internal rotation 4 4  Hip adduction      Ankle dorsiflexion (L4)      Ankle plantarflexion (S1)      Ankle inversion      Ankle eversion      Great Toe ext (L5)      Grossly        (Blank rows = not tested, score listed is out of 5 possible points.  N = WNL, D = diminished, C = clear for gross weakness with myotome testing, * = concordant pain with testing)   LE ROM:   ROM Right 10/14/2021 Left 10/14/2021  Hip flexion 90 * 100*  Hip extension N N  Hip abduction      Hip adduction      Hip internal rotation reduced Reduced   Hip external rotation WNL* WNL*  Knee flexion      Knee extension      Ankle dorsiflexion      Ankle plantarflexion      Ankle inversion      Ankle eversion       (Blank rows = not tested, N = WNL, * = concordant pain with testing)   Functional Tests   Eval (10/14/2021)                                                                                                          LOWER EXTREMITY SPECIAL TESTS:  Fadir (+), faber (+) L Fadir (+), faber (+) R      PATIENT SURVEYS:  FOTO 11/04/2021: 49%, predicted 70% in 14 visits     TODAY'S TREATMENT:  [facility] Adult PT Treatment:                                                DATE: 11/04/2021 Therapeutic Exercise: Supine with feet on floor, BIL hip flexion to 90/90 with subsequent slow eccentric lowering 2x8 Thomas stretch 2x2 min on Lt Standing subsequent hip flexion and knee extension with 3# cable  at Free Motion machine x10 BIL Standing hip abduction with 3# cable ankle attachment at Free Motion machine x10 BIL Standing hip extension with 3# cable ankle attachment at Free Motion machine x10 BIL Manual  Therapy: N/A Neuromuscular re-ed: N/A Therapeutic Activity: Administration of FOTO with pt education Squat to table with alternating mini-squat side step x5 laps down and back length of mat table Modalities: N/A Self Care: N/A   [facility] Adult PT Treatment:                                                DATE: 10/28/2021 Therapeutic Exercise: Standing IT band stretch x58min on Lt Thomas stretch x50min on Lt Standing hip extension with 7# cable 2x10 BIL Standing hamstring curls with 7# cable 2x10 BIL Standing hip abduction with 7# cable 2x10 on Lt Manual Therapy: N/A Neuromuscular re-ed: N/A Therapeutic Activity: Dead lift with 10# kettlebell 3x10 Modalities: N/A Self Care: N/A   [facility] Adult PT Treatment:                                                DATE: 10/24/2021 Therapeutic Exercise: Recumbent bike L1 x 5 min while taking subjective Hooklying hip flexion isometric 2 x 10 with 3 sec hold Hooklying adduction ball squeeze isometric 2 x 10  with 3 sec hold Hooklying hip abduction isometric 2 x 10  with 3 sec hold Bridge partial range 2 x 10 Trial supine SLR and sidelying hip abduction but patient unable to tolerate Manual Therapy: LAD for LLE x 5 bouts for pain relief Modalities: Cold pack x 5 min post session  PATIENT EDUCATION:  HEP update.  Pt educated via explanation, demonstration, and handout (HEP).  Pt confirms understanding verbally.    ASTERISK SIGNS   Asterisk Signs Eval (10/14/2021)                                                                                         HOME EXERCISE PROGRAM: Access Code: [email or web address]    ASSESSMENT: CLINICAL IMPRESSION: Pt responded well to all interventions today, reporting therapeutic response to subsequent eccentric hip flexor loading and hip flexor stretching. Upon assessment of FOTO, pt demonstrates moderate-to-severe functional limitation, although her predictive value indicates a positive prognosis with conservative  care. Due to pt reporting that her neck is no longer a major concern and that she would like to focus on her hip, POC will primarily pursue targeting her hip impairments. She will continue to benefit from skilled PT to address her primary impairments and return to her prior level of function with less limitation.     OBJECTIVE IMPAIRMENTS: Pain, hip ROM, hip and LE strength, gait, posture   ACTIVITY LIMITATIONS: sitting, standing, walking, housework, child care   PERSONAL FACTORS: See medical history and pertinent history     GOALS:   SHORT TERM GOALS:   Noelene will be >  75% HEP compliant to improve carryover between sessions and facilitate independent management of condition   Evaluation (10/14/2021): ongoing Target date: 11/04/2021 Goal status: INITIAL     LONG TERM GOALS:   Amyrah will self report >/= 50% decrease in hip pain from evaluation    Evaluation/Baseline (10/14/2021): 10/10 max pain Target date: 12/09/2021 Goal status: INITIAL     2.  Mellina will be able to go the park and play with her children, not limited by pain   Evaluation/Baseline (10/14/2021): limited Target date: 12/09/2021 Goal status: INITIAL       3.  Madysyn will improve the following MMTs to >/= 4/5 to show improvement in strength:  see chart    Evaluation/Baseline (10/14/2021): see chart in note Target date: 12/09/2021 Goal status: INITIAL     4.  Pt will achieve a FOTO score of 70% in order to demonstrate improved functional ability as it relates to her primary impairments. Baseline (11/03/2021): 49% Target date: 12/09/2021 Goal status: INITIAL       PLAN: PT FREQUENCY: 1-2x/week   PT DURATION: 8 weeks (Ending 12/09/2021)   PLANNED INTERVENTIONS: Therapeutic exercises, Aquatic therapy, Therapeutic activity, Neuro Muscular re-education, Gait training, Patient/Family education, Joint mobilization, Dry Needling, Electrical stimulation, Spinal mobilization and/or manipulation, Moist heat,  Taping, Vasopneumatic device, Ionotophoresis /ml Dexamethasone, and Manual therapy   PLAN FOR NEXT SESSION: check if she has followed up with PCP about tremors, progressive hip strengthening, TPDN prn, IONTO prn?    Carmelina Dane, PT, DPT 11/04/21 9:00 AM

## 2021-11-04 ENCOUNTER — Ambulatory Visit: Payer: Commercial Managed Care - HMO

## 2021-11-04 DIAGNOSIS — M25552 Pain in left hip: Secondary | ICD-10-CM

## 2021-11-04 DIAGNOSIS — M6281 Muscle weakness (generalized): Secondary | ICD-10-CM

## 2021-11-04 DIAGNOSIS — M542 Cervicalgia: Secondary | ICD-10-CM

## 2021-11-04 DIAGNOSIS — R293 Abnormal posture: Secondary | ICD-10-CM

## 2021-11-09 ENCOUNTER — Ambulatory Visit: Payer: Commercial Managed Care - HMO

## 2021-11-09 DIAGNOSIS — M25552 Pain in left hip: Secondary | ICD-10-CM

## 2021-11-09 DIAGNOSIS — M542 Cervicalgia: Secondary | ICD-10-CM

## 2021-11-09 DIAGNOSIS — M6281 Muscle weakness (generalized): Secondary | ICD-10-CM

## 2021-11-09 DIAGNOSIS — R293 Abnormal posture: Secondary | ICD-10-CM

## 2021-11-09 DIAGNOSIS — M25551 Pain in right hip: Secondary | ICD-10-CM

## 2021-11-09 NOTE — Therapy (Signed)
OUTPATIENT PHYSICAL THERAPY TREATMENT NOTE   Patient Name: Maria Smith MRN: [id number] DOB:1996-05-03, 26 y.o., female Today's Date: 11/09/2021  PCP: Deeann SaintBanks, Shannon R, MD   REFERRING PROVIDER: Deeann SaintBanks, Shannon R, MD  END OF SESSION:   PT End of Session - 11/09/21 1610     Visit Number 5    Number of Visits 17    Date for PT Re-Evaluation 12/09/21    Authorization Type [employer]    PT Start Time 1612    PT Stop Time 1655    PT Time Calculation (min) 43 min    Activity Tolerance Patient limited by pain    [facility]/performed                Past Medical History:  Diagnosis Date   Allergic rhinitis    Allergy    SEASONAL   Asthma    Eczema    Environmental allergies    Age 52 years   Fatty liver 12/11/2018   Noted on CT 6/20   Generalized headaches    Began at age 26 years   GERD (gastroesophageal reflux disease)    Influenza    Age 18 years   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 Rifampin since 08/2015   Streptococcal infection(041.00)    Age 26 and 26 years old   TB lung, latent    2017   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/2018   Diffuse enlargement on US 6/20   Past Surgical History:  Procedure Laterality Date   TONSILLECTOMY  2012   WISDOM TOOTH EXTRACTION     Patient Active Problem List   Diagnosis Date Noted   Pain in left hip 01/27/2021   Bruising 01/27/2021   Left-sided chest wall pain 03/23/2020   Positive ANA (antinuclear antibody) 03/23/2020   Asthma 02/10/2020   Allergic rhinitis 02/10/2020   GERD (gastroesophageal reflux disease) 02/10/2020   History of TB (tuberculosis) 08/21/2019   Throat pain 01/28/2019   Odynophagia 01/28/2019   Neck pain 01/28/2019   Thyroiditis 01/12/2019   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/2018   Fatty liver 12/11/2018   PCOS (polycystic ovarian syndrome) 11/11/2018   Seasonal allergies 09/22/2018   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   Migraine with aura and without status migrainosus, not intractable 09/11/2012    Variants of migraine, not elsewhere classified, without mention of intractable migraine without mention of status migrainosus 09/11/2012   Unspecified constipation 09/11/2012   Syncope 08/31/2010   Neck Pain 08/31/2010   Insomnia 08/31/2010    REFERRING DIAG: Cervicalgia, Bilateral hip pain   THERAPY DIAG:  Pain in left hip  Neck pain  Muscle weakness (generalized)  Abnormal posture  Pain in right hip  PERTINENT HISTORY: ANA + with no specific auto immune disease diagnosis, multiple bouts of PT minimally effective, thyromegaly, latent TB  PRECAUTIONS: None  SUBJECTIVE: 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 She reports that her hip hip feels more stiff today than usual. She rates her pain as 5/10.   PAIN:  Are you having pain? Yes Pain location: mostly lateral hips, extending to anterior and posterior L>R NPRS scale:  current 5/10 average 8/10  Max: 10/10 Aggravating factors: sitting long periods, turning while sitting, running Relieving factors: rest, TDN Pain description: constant and aching Stage: Chronic Stability: staying the same 24 hour pattern: after activity   Are you having pain? Yes Pain location: neck pain central to t2 and bil UT/LS NPRS scale:  current 0/10  average 8/10  Aggravating factors: prolonged positions at work on computer, bending, lifting sleeping "wrong" Relieving factors: rest Pain description: intermittent, constant, aching, and tight Stage: Chronic Stability: staying the same 24 hour pattern: NA    Patient Goals/Specific Activities: reduce pain, improve strength   OBJECTIVE: (objective measures completed at initial evaluation unless otherwise dated)   GENERAL OBSERVATION/GAIT: R shoulder and R hip depressed compared to L in gait, antalgic gait; tremor with exercise   SENSATION:          Light touch: Appears intact   PALPATION: Exquisite tenderness GT L, TTP TFL L     LE MMT:   MMT  Right 10/14/2021 Left 10/14/2021  Hip flexion (L2, L3) 4 3+  Knee extension (L3) 4 3+  Knee flexion 4 4  Hip abduction 3+ 3-**  Hip extension      Hip external rotation 4 3+  Hip internal rotation 4 4  Hip adduction      Ankle dorsiflexion (L4)      Ankle plantarflexion (S1)      Ankle inversion      Ankle eversion      Great Toe ext (L5)      Grossly        (Blank rows = not tested, score listed is out of 5 possible points.  N = WNL, D = diminished, C = clear for gross weakness with myotome testing, * = concordant pain with testing)   LE ROM:   ROM Right 10/14/2021 Left 10/14/2021  Hip flexion 90 * 100*  Hip extension N N  Hip abduction      Hip adduction      Hip internal rotation reduced Reduced   Hip external rotation WNL* WNL*  Knee flexion      Knee extension      Ankle dorsiflexion      Ankle plantarflexion      Ankle inversion      Ankle eversion       (Blank rows = not tested, N = WNL, * = concordant pain with testing)   Functional Tests   Eval (10/14/2021)                                                                                                          LOWER EXTREMITY SPECIAL TESTS:  Fadir (+), faber (+) L Fadir (+), faber (+) R      PATIENT SURVEYS:  FOTO 11/04/2021: 49%, predicted 70% in 14 visits     TODAY'S TREATMENT:  [facility] Adult PT Treatment:                                                DATE: 11/09/2021 Therapeutic Exercise: Maria Fus stretch 2x2 min on Lt Supine with feet on floor, BIL hip flexion to 90/90 with subsequent slow eccentric lowering with 2# ankle weights 2x10 Side knee plank with clamshells 2x10 BIL Mini-squat side  step with 10# cable to waist attachment x5 BIL at Free Motion machines Dead lift with 45# barbell 3x8 Standing Lt IT band stretch with overhead reach x33min Manual Therapy: N/A Neuromuscular re-ed: N/A Therapeutic Activity: N/A Modalities: N/A Self Care: N/A   [facility] Adult PT Treatment:                                                 DATE: 11/04/2021 Therapeutic Exercise: Supine with feet on floor, BIL hip flexion to 90/90 with subsequent slow eccentric lowering 2x8 Smith stretch 2x2 min on Lt Standing subsequent hip flexion and knee extension with 3# cable at [employer] machine x10 BIL Standing hip abduction with 3# cable ankle attachment at Free Motion machine x10 BIL Standing hip extension with 3# cable ankle attachment at Free Motion machine x10 BIL Manual Therapy: N/A Neuromuscular re-ed: N/A Therapeutic Activity: Administration of FOTO with pt education Squat to table with alternating mini-squat side step x5 laps down and back length of mat table Modalities: N/A Self Care: N/A   [facility] Adult PT Treatment:                                                DATE: 10/28/2021 Therapeutic Exercise: Standing IT band stretch x45min on Lt Smith stretch x68min on Lt Standing hip extension with 7# cable 2x10 BIL Standing hamstring curls with 7# cable 2x10 BIL Standing hip abduction with 7# cable 2x10 on Lt Manual Therapy: N/A Neuromuscular re-ed: N/A Therapeutic Activity: Dead lift with 10# kettlebell 3x10 Modalities: N/A Self Care: N/A    PATIENT EDUCATION:  HEP update.  Pt educated via explanation, demonstration, and handout (HEP).  Pt confirms understanding verbally.    ASTERISK SIGNS   Asterisk Signs Eval (10/14/2021)                                                                                         HOME EXERCISE PROGRAM: Access Code: [email or web address]    ASSESSMENT: CLINICAL IMPRESSION: Pt responded well to all interventions today, demonstrating good form and no increase in baseline pain, although she continues to have 5/10 pain throughout the session. She will continue to benefit from skilled PT to address her primary impairments and return to her prior level of function with less limitation.     OBJECTIVE IMPAIRMENTS: Pain, hip ROM, hip and LE strength,  gait, posture   ACTIVITY LIMITATIONS: sitting, standing, walking, housework, child care   PERSONAL FACTORS: See medical history and pertinent history     GOALS:   SHORT TERM GOALS:   Maria Smith will be >75% HEP compliant to improve carryover between sessions and facilitate independent management of condition   Evaluation (10/14/2021): ongoing Target date: 11/04/2021 Goal status: INITIAL     LONG TERM GOALS:   Maria Smith will self report >/= 50% decrease in hip pain from evaluation    Evaluation/Baseline (10/14/2021):  10/10 max pain Target date: 12/09/2021 Goal status: INITIAL     2.  Maria Smith will be able to go the park and play with her children, not limited by pain   Evaluation/Baseline (10/14/2021): limited Target date: 12/09/2021 Goal status: INITIAL       3.  Maria Smith will improve the following MMTs to >/= 4/5 to show improvement in strength:  see chart    Evaluation/Baseline (10/14/2021): see chart in note Target date: 12/09/2021 Goal status: INITIAL     4.  Pt will achieve a FOTO score of 70% in order to demonstrate improved functional ability as it relates to her primary impairments. Baseline (11/03/2021): 49% Target date: 12/09/2021 Goal status: INITIAL       PLAN: PT FREQUENCY: 1-2x/week   PT DURATION: 8 weeks (Ending 12/09/2021)   PLANNED INTERVENTIONS: Therapeutic exercises, Aquatic therapy, Therapeutic activity, Neuro Muscular re-education, Gait training, Patient/Family education, Joint mobilization, Dry Needling, Electrical stimulation, Spinal mobilization and/or manipulation, Moist heat, Taping, Vasopneumatic device, Ionotophoresis 4mg /ml Dexamethasone, and Manual therapy   PLAN FOR NEXT SESSION: check if she has followed up with PCP about tremors, progressive hip strengthening, TPDN prn, IONTO prn?    , PT, DPT 11/09/21 4:56 PM

## 2021-11-11 ENCOUNTER — Other Ambulatory Visit: Payer: Commercial Managed Care - HMO

## 2021-11-22 ENCOUNTER — Ambulatory Visit: Payer: Commercial Managed Care - HMO | Attending: Family Medicine

## 2021-11-22 DIAGNOSIS — M25552 Pain in left hip: Secondary | ICD-10-CM | POA: Insufficient documentation

## 2021-11-22 DIAGNOSIS — M6281 Muscle weakness (generalized): Secondary | ICD-10-CM | POA: Insufficient documentation

## 2021-11-22 DIAGNOSIS — M25551 Pain in right hip: Secondary | ICD-10-CM | POA: Insufficient documentation

## 2021-11-22 DIAGNOSIS — R293 Abnormal posture: Secondary | ICD-10-CM | POA: Diagnosis present

## 2021-11-22 DIAGNOSIS — M542 Cervicalgia: Secondary | ICD-10-CM | POA: Diagnosis present

## 2021-11-22 NOTE — Therapy (Signed)
OUTPATIENT PHYSICAL THERAPY TREATMENT NOTE   Patient Name: Maria Smith MRN: [id number] DOB:[birth year], 26 y.o., female Today's Date: 11/22/2021  PCP: Billie Ruddy, MD   REFERRING PROVIDER: Billie Ruddy, MD  END OF SESSION:   PT End of Session - 11/22/21 1003     Visit Number 6    Number of Visits 17    Date for PT Re-Evaluation 12/09/21    Authorization Type [employer]    PT Start Time 1002    PT Stop Time 1040    PT Time Calculation (min) 38 min    Activity Tolerance Patient limited by pain    Behavior During Therapy [facility] for tasks assessed/performed                 Past Medical History:  Diagnosis Date   Allergic rhinitis    Allergy    SEASONAL   Asthma    Eczema    Environmental allergies    Age 21 years   Fatty liver 12/11/2018   Noted on CT 6/20   Generalized headaches    Began at age 14 years   GERD (gastroesophageal reflux disease)    Influenza    Age 89 years   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 Rifampin since 08/2015   Streptococcal infection(041.00)    Age 48 and 26 years old   TB lung, latent    2017   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/2018   Diffuse enlargement on [place] 6/20   Past Surgical History:  Procedure Laterality Date   TONSILLECTOMY  2012   WISDOM TOOTH EXTRACTION     Patient Active Problem List   Diagnosis Date Noted   Pain in left hip 01/27/2021   Bruising 01/27/2021   Left-sided chest wall pain 03/23/2020   Positive ANA (antinuclear antibody) 03/23/2020   Asthma 02/10/2020   Allergic rhinitis 02/10/2020   GERD (gastroesophageal reflux disease) 02/10/2020   History of TB (tuberculosis) 08/21/2019   Throat pain 01/28/2019   Odynophagia 01/28/2019   Neck pain 01/28/2019   Thyroiditis 01/12/2019   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/2018   Fatty liver 12/11/2018   PCOS (polycystic ovarian syndrome) 11/11/2018   Seasonal allergies 09/22/2018   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   Migraine with aura and without status migrainosus, not intractable 09/11/2012    Variants of migraine, not elsewhere classified, without mention of intractable migraine without mention of status migrainosus 09/11/2012   Unspecified constipation 09/11/2012   Syncope 08/31/2010   Neck Pain 08/31/2010   Insomnia 08/31/2010    REFERRING DIAG: Cervicalgia, Bilateral hip pain   THERAPY DIAG:  Pain in left hip  Neck pain  Muscle weakness (generalized)  Abnormal posture  Pain in right hip  PERTINENT HISTORY: ANA + with no specific auto immune disease diagnosis, multiple bouts of PT minimally effective, thyromegaly, latent TB  PRECAUTIONS: None  SUBJECTIVE: Pt reports feeling much better today, reporting no pain in her hip today, although she reports occasional "catching" in the hip. She reports she has been walking regularly and got a new gym membership. At the gym, she has been doing a stair machine, treadmill, and row machine.   PAIN:  Are you having pain? Yes Pain location: mostly lateral hips, extending to anterior and posterior L>R NPRS scale:  current 0/10 average 2/10  Max: 5/10 Aggravating factors: sitting long periods, turning while sitting, running Relieving factors: rest, TDN Pain description: constant and aching Stage: Chronic Stability: staying the same 24 hour pattern: after activity   Are you having pain?  Yes Pain location: neck pain central to t2 and bil UT/LS NPRS scale:  current 0/10  average 8/10  Aggravating factors: prolonged positions at work on computer, bending, lifting sleeping "wrong" Relieving factors: rest Pain description: intermittent, constant, aching, and tight Stage: Chronic Stability: staying the same 24 hour pattern: NA    Patient Goals/Specific Activities: reduce pain, improve strength   OBJECTIVE: (objective measures completed at initial evaluation unless otherwise dated)   GENERAL OBSERVATION/GAIT: R shoulder and R hip depressed compared to L in gait, antalgic gait; tremor with exercise   SENSATION:           Light touch: Appears intact   PALPATION: Exquisite tenderness GT L, TTP TFL L     LE MMT:   MMT Right 10/14/2021 Left 10/14/2021 Right 11/22/2021 Left 11/22/2021  Hip flexion (L2, L3) 4 3+ 5/5 5/5  Knee extension (L3) 4 3+ 5/5 5/5  Knee flexion 4 4 5/5 5/5  Hip abduction 3+ 3-** 5/5 4+/5  Hip extension     4+/5 4+/5  Hip external rotation 4 3+ 5/5 4/5  Hip internal rotation 4 4 5/5 4+/5  Hip adduction        Ankle dorsiflexion (L4)        Ankle plantarflexion (S1)        Ankle inversion        Ankle eversion        Great Toe ext (L5)        Grossly          (Blank rows = not tested, score listed is out of 5 possible points.  N = WNL, D = diminished, C = clear for gross weakness with myotome testing, * = concordant pain with testing)   LE ROM:   ROM Right 10/14/2021 Left 10/14/2021 Right 11/22/2021 Left 11/22/2021  Hip flexion 90 * 100* 124/140 118/130 with "pulling"  Hip extension N N    Hip abduction        Hip adduction        Hip internal rotation reduced Reduced  42 42  Hip external rotation WNL* WNL* 35 32  Knee flexion        Knee extension        Ankle dorsiflexion        Ankle plantarflexion        Ankle inversion        Ankle eversion         (Blank rows = not tested, N = WNL, * = concordant pain with testing)   Functional Tests   Eval (10/14/2021)                                                                                                          LOWER EXTREMITY SPECIAL TESTS:  Fadir (+), faber (+) L Fadir (+), faber (+) R      PATIENT SURVEYS:  FOTO 11/04/2021: 49%, predicted 70% in 14 visits  FOTO 11/22/2021: 69%     TODAY'S TREATMENT:  [facility] Adult PT Treatment:  DATE: 11/22/2021 Therapeutic Exercise: SLS Pallof press with 3# cable x10 with 5-sec hold each side BIL Hip hikes on 4-inc step 2x10 BIL Side lunge adductor stretch x1 min BIL Standing IT band stretch x28mn BIL Manual  Therapy: N/A Neuromuscular re-ed: N/A Therapeutic Activity: Re-assessment of objective measures with pt education Re-administration of FOTO with pt education Modalities: N/A Self Care: N/A   [facility] Adult PT Treatment:                                                DATE: 11/09/2021 Therapeutic Exercise: TMarcello Mooresstretch 2x2 min on Lt Supine with feet on floor, BIL hip flexion to 90/90 with subsequent slow eccentric lowering with 2# ankle weights 2x10 Side knee plank with clamshells 2x10 BIL Mini-squat side step with 10# cable to waist attachment x5 BIL at Free Motion machines Dead lift with 45# barbell 3x8 Standing Lt IT band stretch with overhead reach x230m Manual Therapy: N/A Neuromuscular re-ed: N/A Therapeutic Activity: N/A Modalities: N/A Self Care: N/A   [facility] Adult PT Treatment:                                                DATE: 11/04/2021 Therapeutic Exercise: Supine with feet on floor, BIL hip flexion to 90/90 with subsequent slow eccentric lowering 2x8 Thomas stretch 2x2 min on Lt Standing subsequent hip flexion and knee extension with 3# cable at [employer] x10 BIL Standing hip abduction with 3# cable ankle attachment at Free Motion machine x10 BIL Standing hip extension with 3# cable ankle attachment at Free Motion machine x10 BIL Manual Therapy: N/A Neuromuscular re-ed: N/A Therapeutic Activity: Administration of FOTO with pt education Squat to table with alternating mini-squat side step x5 laps down and back length of mat table Modalities: N/A Self Care: N/A     PATIENT EDUCATION:  HEP update.  Pt educated via explanation, demonstration, and handout (HEP).  Pt confirms understanding verbally.    ASTERISK SIGNS   Asterisk Signs Eval (10/14/2021)                                                                                         HOME EXERCISE PROGRAM: Access Code: [email or web address]  ASSESSMENT: CLINICAL IMPRESSION: Pt responded well  to all interventions today, demonstrating improve form and no increase in pain with performed exercises. Upon re-assessment of objective measures, she has made excellent progress in hip strength, ROM, pain levels, and FOTO score. She will continue to benefit from skilled PT to address her primary impairments and return to her prior level of function with less limitation.    OBJECTIVE IMPAIRMENTS: Pain, hip ROM, hip and LE strength, gait, posture   ACTIVITY LIMITATIONS: sitting, standing, walking, housework, child care   PERSONAL FACTORS: See medical history and pertinent history     GOALS:   SHORT TERM GOALS:   [employer]  will be >75% HEP compliant to improve carryover between sessions and facilitate independent management of condition   Evaluation (10/14/2021): ongoing Target date: 11/04/2021 11/22/2021: Pt reports daily adherence to her HEP Goal status: ACHIEVED     LONG TERM GOALS:   Loranda will self report >/= 50% decrease in hip pain from evaluation    Evaluation/Baseline (10/14/2021): 10/10 max pain Target date: 12/09/2021 11/22/2021: Pt reports 30% improvement in pain Goal status: PROGRESSING     2.  Jani will be able to go the park and play with her children, not limited by pain   Evaluation/Baseline (10/14/2021): limited Target date: 12/09/2021 Goal status: INITIAL       3.  Teralyn will improve the following MMTs to >/= 4/5 to show improvement in strength:  see chart    Evaluation/Baseline (10/14/2021): see chart in note Target date: 12/09/2021 11/22/2021: See updated MMT chart Goal status: ACHIEVED     4.  Pt will achieve a FOTO score of 70% in order to demonstrate improved functional ability as it relates to her primary impairments. Baseline (11/03/2021): 49% Target date: 12/09/2021 11/22/2021: 69% Goal status: NEARLY MET       PLAN: PT FREQUENCY: 1-2x/week   PT DURATION: 8 weeks (Ending 12/09/2021)   PLANNED INTERVENTIONS: Therapeutic exercises, Aquatic  therapy, Therapeutic activity, Neuro Muscular re-education, Gait training, Patient/Family education, Joint mobilization, Dry Needling, Electrical stimulation, Spinal mobilization and/or manipulation, Moist heat, Taping, Vasopneumatic device, Ionotophoresis 40m/ml Dexamethasone, and Manual therapy   PLAN FOR NEXT SESSION: check if she has followed up with PCP about tremors, progressive hip strengthening, TPDN prn, IONTO prn?    YVanessa [place] PT, DPT 11/22/21 10:42 AM

## 2021-11-29 ENCOUNTER — Ambulatory Visit: Payer: Commercial Managed Care - HMO

## 2021-11-29 DIAGNOSIS — M6281 Muscle weakness (generalized): Secondary | ICD-10-CM

## 2021-11-29 DIAGNOSIS — M25551 Pain in right hip: Secondary | ICD-10-CM

## 2021-11-29 DIAGNOSIS — M25552 Pain in left hip: Secondary | ICD-10-CM | POA: Diagnosis not present

## 2021-11-29 DIAGNOSIS — R293 Abnormal posture: Secondary | ICD-10-CM

## 2021-11-29 DIAGNOSIS — M542 Cervicalgia: Secondary | ICD-10-CM

## 2021-11-29 NOTE — Therapy (Signed)
OUTPATIENT PHYSICAL THERAPY TREATMENT NOTE/ DISCHARGE SUMMARY   Patient Name: Maria Smith MRN: [id number] DOB:07/19/1995, 26 y.o., female Today's Date: 11/29/2021  PCP: Billie Ruddy, MD   REFERRING PROVIDER: Billie Ruddy, MD  END OF SESSION:   PT End of Session - 11/29/21 0913     Visit Number 7    Number of Visits 17    Date for PT Re-Evaluation 12/09/21    Authorization Type [employer]    PT Start Time 0915    PT Stop Time 0955    PT Time Calculation (min) 40 min    Activity Tolerance Patient tolerated treatment well    Behavior During Therapy [facility] for tasks assessed/performed                  Past Medical History:  Diagnosis Date   Allergic rhinitis    Allergy    SEASONAL   Asthma    Eczema    Environmental allergies    Age 67 years   Fatty liver 12/11/2018   Noted on CT 6/20   Generalized headaches    Began at age 22 years   GERD (gastroesophageal reflux disease)    Influenza    Age 49 years   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 Rifampin since 08/2015   Streptococcal infection(041.00)    Age 64 and 26 years old   TB lung, latent    2017   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/2018   Diffuse enlargement on [place] 6/20   Past Surgical History:  Procedure Laterality Date   TONSILLECTOMY  2012   WISDOM TOOTH EXTRACTION     Patient Active Problem List   Diagnosis Date Noted   Pain in left hip 01/27/2021   Bruising 01/27/2021   Left-sided chest wall pain 03/23/2020   Positive ANA (antinuclear antibody) 03/23/2020   Asthma 02/10/2020   Allergic rhinitis 02/10/2020   GERD (gastroesophageal reflux disease) 02/10/2020   History of TB (tuberculosis) 08/21/2019   Throat pain 01/28/2019   Odynophagia 01/28/2019   Neck pain 01/28/2019   Thyroiditis 01/12/2019   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/2018   Fatty liver 12/11/2018   PCOS (polycystic ovarian syndrome) 11/11/2018   Seasonal allergies 09/22/2018   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   Migraine with aura and without status  migrainosus, not intractable 09/11/2012   Variants of migraine, not elsewhere classified, without mention of intractable migraine without mention of status migrainosus 09/11/2012   Unspecified constipation 09/11/2012   Syncope 08/31/2010   Neck Pain 08/31/2010   Insomnia 08/31/2010    REFERRING DIAG: Cervicalgia, Bilateral hip pain   THERAPY DIAG:  Pain in left hip  Neck pain  Muscle weakness (generalized)  Abnormal posture  Pain in right hip  PERTINENT HISTORY: ANA + with no specific auto immune disease diagnosis, multiple bouts of PT minimally effective, thyromegaly, latent TB  PRECAUTIONS: None  SUBJECTIVE: Pt reports no pain today, adding that that she has had only minimal soreness about her hips recently. She reports feeling independent in her HEP and pleased with her progress made in PT. She adds that she feels ready to be discharged at this time.  PAIN:  Are you having pain? Yes Pain location: mostly lateral hips, extending to anterior and posterior L>R NPRS scale:  current 0/10 average 2/10  Max: 5/10 Aggravating factors: sitting long periods, turning while sitting, running Relieving factors: rest, TDN Pain description: constant and aching Stage: Chronic Stability: staying the same 24 hour pattern: after activity   Are you having pain? Yes  Pain location: neck pain central to t2 and bil UT/LS NPRS scale:  current 0/10  average 8/10  Aggravating factors: prolonged positions at work on computer, bending, lifting sleeping "wrong" Relieving factors: rest Pain description: intermittent, constant, aching, and tight Stage: Chronic Stability: staying the same 24 hour pattern: NA    Patient Goals/Specific Activities: reduce pain, improve strength   OBJECTIVE: (objective measures completed at initial evaluation unless otherwise dated)   GENERAL OBSERVATION/GAIT: R shoulder and R hip depressed compared to L in gait, antalgic gait; tremor with exercise    SENSATION:          Light touch: Appears intact   PALPATION: Exquisite tenderness GT L, TTP TFL L     LE MMT:   MMT Right 10/14/2021 Left 10/14/2021 Right 11/22/2021 Left 11/22/2021  Hip flexion (L2, L3) 4 3+ 5/5 5/5  Knee extension (L3) 4 3+ 5/5 5/5  Knee flexion 4 4 5/5 5/5  Hip abduction 3+ 3-** 5/5 4+/5  Hip extension     4+/5 4+/5  Hip external rotation 4 3+ 5/5 4/5  Hip internal rotation 4 4 5/5 4+/5  Hip adduction        Ankle dorsiflexion (L4)        Ankle plantarflexion (S1)        Ankle inversion        Ankle eversion        Great Toe ext (L5)        Grossly          (Blank rows = not tested, score listed is out of 5 possible points.  N = WNL, D = diminished, C = clear for gross weakness with myotome testing, * = concordant pain with testing)   LE ROM:   ROM Right 10/14/2021 Left 10/14/2021 Right 11/22/2021 Left 11/22/2021  Hip flexion 90 * 100* 124/140 118/130 with "pulling"  Hip extension N N    Hip abduction        Hip adduction        Hip internal rotation reduced Reduced  42 42  Hip external rotation WNL* WNL* 35 32  Knee flexion        Knee extension        Ankle dorsiflexion        Ankle plantarflexion        Ankle inversion        Ankle eversion         (Blank rows = not tested, N = WNL, * = concordant pain with testing)   Functional Tests   Eval (10/14/2021)                                                                                                          LOWER EXTREMITY SPECIAL TESTS:  Fadir (+), faber (+) L Fadir (+), faber (+) R      PATIENT SURVEYS:  FOTO 11/04/2021: 49%, predicted 70% in 14 visits  FOTO 11/22/2021: 69% FOTO 11/29/2021: 81%     TODAY'S TREATMENT:  [facility] Adult PT Treatment:  DATE: 11/29/2021 Therapeutic Exercise: Marcello Moores stretch x3mn on Lt Manual Therapy: N/A Neuromuscular re-ed: N/A Therapeutic Activity: Re-administration of FOTO with pt education Pt  education regarding graded exposure to increased loading in the gym, along with examples of exercise progression/ dosage Dead lift x8 with 45# barbell, x8 with 75# barbell, x6 with 85# barbell [place] squat with 25# kettlebell 2x8 BIL Modalities: N/A Self Care: N/A   [facility] Adult PT Treatment:                                                DATE: 11/22/2021 Therapeutic Exercise: SLS Pallof press with 3# cable x10 with 5-sec hold each side BIL Hip hikes on 4-inc step 2x10 BIL Side lunge adductor stretch x1 min BIL Standing IT band stretch x247m BIL Manual Therapy: N/A Neuromuscular re-ed: N/A Therapeutic Activity: Re-assessment of objective measures with pt education Re-administration of FOTO with pt education Modalities: N/A Self Care: N/A   [facility] Adult PT Treatment:                                                DATE: 11/09/2021 Therapeutic Exercise: ThMarcello Moorestretch 2x2 min on Lt Supine with feet on floor, BIL hip flexion to 90/90 with subsequent slow eccentric lowering with 2# ankle weights 2x10 Side knee plank with clamshells 2x10 BIL Mini-squat side step with 10# cable to waist attachment x5 BIL at Free Motion machines Dead lift with 45# barbell 3x8 Standing Lt IT band stretch with overhead reach x2m66mManual Therapy: N/A Neuromuscular re-ed: N/A Therapeutic Activity: N/A Modalities: N/A Self Care: N/A    PATIENT EDUCATION:  HEP update.  Pt educated via explanation, demonstration, and handout (HEP).  Pt confirms understanding verbally.    ASTERISK SIGNS   Asterisk Signs Eval (10/14/2021)                                                                                         HOME EXERCISE PROGRAM: Access Code: [email or web address]: [email or web address] Date: 11/29/2021 Prepared by: TucVanessa Durhamxercises - Hooklying Isometric Clamshell  - 1-2 x daily - 2 sets - 10 reps - 3-5 seconds hold - Bridge  - 1-2 x daily - 2 sets - 10 reps -  3-5 seconds hold - Supine Hip Adduction Isometric with Ball  - 1-2 x daily - 2 sets - 10 reps - 3-5 seconds hold - Hooklying Isometric Hip Flexion  - 1-2 x daily - 2 sets - 10 reps - 3-5 seconds hold - Barbell Deadlift  - 1 x daily - 7 x weekly - 3 sets - 10 reps - Single Leg Lunge with Foot on Bench  - 1 x daily - 7 x weekly - 3 sets - 10 reps    ASSESSMENT: CLINICAL IMPRESSION: Upon re-assessment of goals, the pt has met all of her functional rehab goals. She reports she is  satisfied with progress made to this point and is ready for discharge. Due to these factors, the pt is discharged with an updated HEP.    OBJECTIVE IMPAIRMENTS: Pain, hip ROM, hip and LE strength, gait, posture   ACTIVITY LIMITATIONS: sitting, standing, walking, housework, child care   PERSONAL FACTORS: See medical history and pertinent history     GOALS:   SHORT TERM GOALS:   Laraine will be >75% HEP compliant to improve carryover between sessions and facilitate independent management of condition   Evaluation (10/14/2021): ongoing Target date: 11/04/2021 11/22/2021: Pt reports daily adherence to her HEP Goal status: ACHIEVED     LONG TERM GOALS:   Ivone will self report >/= 50% decrease in hip pain from evaluation    Evaluation/Baseline (10/14/2021): 10/10 max pain Target date: 12/09/2021 11/22/2021: Pt reports 30% improvement in pain 11/29/2021: Pt reports average pain of 0/10 Goal status: ACHIEVED     2.  Sophia will be able to go the park and play with her children, not limited by pain   Evaluation/Baseline (10/14/2021): limited Target date: 12/09/2021 11/29/2021: Pt reports no limitation  Goal status: ACHIEVED       3.  Rickayla will improve the following MMTs to >/= 4/5 to show improvement in strength:  see chart    Evaluation/Baseline (10/14/2021): see chart in note Target date: 12/09/2021 11/22/2021: See updated MMT chart Goal status: ACHIEVED     4.  Pt will achieve a FOTO score of 70%  in order to demonstrate improved functional ability as it relates to her primary impairments. Baseline (11/03/2021): 49% Target date: 12/09/2021 11/22/2021: 69% 11/29/2021: 81% Goal status: ACHIEVED       PLAN: PT FREQUENCY: 1-2x/week   PT DURATION: 8 weeks (Ending 12/09/2021)   PLANNED INTERVENTIONS: Therapeutic exercises, Aquatic therapy, Therapeutic activity, Neuro Muscular re-education, Gait training, Patient/Family education, Joint mobilization, Dry Needling, Electrical stimulation, Spinal mobilization and/or manipulation, Moist heat, Taping, Vasopneumatic device, Ionotophoresis 98m/ml Dexamethasone, and Manual therapy   PLAN FOR NEXT SESSION: Pt is discharged from PT at this time   PHYSICAL THERAPY DISCHARGE SUMMARY  Visits from Start of Care: 7  Current functional level related to goals / functional outcomes: Pt has met all of her functional goals.   Remaining deficits: N/A   Education / Equipment: HEP   Patient agrees to discharge. Patient goals were met. Patient is being discharged due to meeting the stated rehab goals.   YVanessa [place] PT, DPT 11/29/21 9:55 AM

## 2021-11-30 ENCOUNTER — Encounter: Payer: Commercial Managed Care - HMO | Admitting: Physical Therapy

## 2021-12-06 ENCOUNTER — Encounter: Payer: Self-pay | Admitting: Family Medicine

## 2021-12-08 ENCOUNTER — Ambulatory Visit (INDEPENDENT_AMBULATORY_CARE_PROVIDER_SITE_OTHER): Payer: Commercial Managed Care - HMO | Admitting: Physician Assistant

## 2021-12-08 ENCOUNTER — Encounter: Payer: Self-pay | Admitting: Physician Assistant

## 2021-12-08 DIAGNOSIS — M25552 Pain in left hip: Secondary | ICD-10-CM | POA: Diagnosis not present

## 2021-12-21 ENCOUNTER — Other Ambulatory Visit: Payer: Self-pay | Admitting: Physician Assistant

## 2021-12-21 DIAGNOSIS — M25552 Pain in left hip: Secondary | ICD-10-CM

## 2021-12-28 DIAGNOSIS — Z5321 Procedure and treatment not carried out due to patient leaving prior to being seen by health care provider: Secondary | ICD-10-CM | POA: Diagnosis not present

## 2021-12-28 DIAGNOSIS — R5383 Other fatigue: Secondary | ICD-10-CM | POA: Insufficient documentation

## 2021-12-28 DIAGNOSIS — R0789 Other chest pain: Secondary | ICD-10-CM | POA: Diagnosis not present

## 2021-12-29 ENCOUNTER — Encounter: Payer: Self-pay | Admitting: Family

## 2021-12-29 ENCOUNTER — Emergency Department: Payer: Commercial Managed Care - HMO

## 2021-12-29 ENCOUNTER — Other Ambulatory Visit: Payer: Self-pay

## 2021-12-29 ENCOUNTER — Other Ambulatory Visit: Payer: Commercial Managed Care - HMO

## 2021-12-29 ENCOUNTER — Encounter: Payer: Self-pay | Admitting: Emergency Medicine

## 2021-12-29 ENCOUNTER — Ambulatory Visit (INDEPENDENT_AMBULATORY_CARE_PROVIDER_SITE_OTHER): Payer: Commercial Managed Care - HMO | Admitting: Family

## 2021-12-29 ENCOUNTER — Emergency Department
Admission: EM | Admit: 2021-12-29 | Discharge: 2021-12-29 | Discharge status: Against Medical Advice | Payer: Commercial Managed Care - HMO | Attending: Emergency Medicine | Admitting: Emergency Medicine

## 2021-12-29 VITALS — BP 94/60 | HR 77 | Temp 98.2°F | Ht 59.0 in | Wt 117.4 lb

## 2021-12-29 DIAGNOSIS — R768 Other specified abnormal immunological findings in serum: Secondary | ICD-10-CM

## 2021-12-29 DIAGNOSIS — M79669 Pain in unspecified lower leg: Secondary | ICD-10-CM | POA: Diagnosis not present

## 2021-12-29 DIAGNOSIS — R5383 Other fatigue: Secondary | ICD-10-CM

## 2021-12-29 LAB — COMPREHENSIVE METABOLIC PANEL
ALT: 11 U/L (ref 0–44)
AST: 19 U/L (ref 15–41)
Albumin: 4 g/dL (ref 3.5–5.0)
Alkaline Phosphatase: 37 U/L — ABNORMAL LOW (ref 38–126)
Anion gap: 3 — ABNORMAL LOW (ref 5–15)
BUN: 7 mg/dL (ref 6–20)
CO2: 27 mmol/L (ref 22–32)
Calcium: 9.5 mg/dL (ref 8.9–10.3)
Chloride: 108 mmol/L (ref 98–111)
Creatinine, Ser: 0.86 mg/dL (ref 0.44–1.00)
GFR, Estimated: >60 mL/min (ref >60)
Glucose, Bld: 105 mg/dL — ABNORMAL HIGH (ref 70–99)
Potassium: 3.5 mmol/L (ref 3.5–5.1)
Sodium: 138 mmol/L (ref 135–145)
Total Bilirubin: 0.6 mg/dL (ref 0.3–1.2)
Total Protein: 7.1 g/dL (ref 6.5–8.1)

## 2021-12-29 LAB — URINALYSIS, ROUTINE W REFLEX MICROSCOPIC
Bilirubin Urine: NEGATIVE
Glucose, UA: NEGATIVE mg/dL
Hgb urine dipstick: NEGATIVE
Ketones, ur: NEGATIVE mg/dL
Leukocytes,Ua: NEGATIVE
Nitrite: NEGATIVE
Protein, ur: NEGATIVE mg/dL
Specific Gravity, Urine: 1.003 — ABNORMAL LOW (ref 1.005–1.030)
pH: 8 (ref 5.0–8.0)

## 2021-12-29 LAB — CBC WITH DIFFERENTIAL/PLATELET
Abs Immature Granulocytes: 0.01 10*3/uL (ref 0.00–0.07)
Basophils Absolute: 0 10*3/uL (ref 0.0–0.1)
Basophils Relative: 1 %
Eosinophils Absolute: 0.1 10*3/uL (ref 0.0–0.5)
Eosinophils Relative: 1 %
HCT: 37.9 % (ref 36.0–46.0)
Hemoglobin: 12.3 g/dL (ref 12.0–15.0)
Immature Granulocytes: 0 %
Lymphocytes Relative: 36 %
Lymphs Abs: 2.6 10*3/uL (ref 0.7–4.0)
MCH: 28.1 pg (ref 26.0–34.0)
MCHC: 32.5 g/dL (ref 30.0–36.0)
MCV: 86.7 fL (ref 80.0–100.0)
Monocytes Absolute: 0.5 10*3/uL (ref 0.1–1.0)
Monocytes Relative: 6 %
Neutro Abs: 4.1 10*3/uL (ref 1.7–7.7)
Neutrophils Relative %: 56 %
Platelets: 294 10*3/uL (ref 150–400)
RBC: 4.37 MIL/uL (ref 3.87–5.11)
RDW: 12.2 % (ref 11.5–15.5)
WBC: 7.3 10*3/uL (ref 4.0–10.5)
nRBC: 0 % (ref 0.0–0.2)

## 2021-12-29 LAB — POC URINE PREG, ED: Preg Test, Ur: NEGATIVE

## 2021-12-29 LAB — TROPONIN I (HIGH SENSITIVITY): Troponin I (High Sensitivity): 3 ng/L (ref <18)

## 2021-12-29 NOTE — ED Triage Notes (Signed)
Patient ambulatory to triage with steady gait, without difficulty or distress noted; pt reports left side sided CP, nonradiating for several days accomp by fatigue; denies hx of same

## 2021-12-29 NOTE — Patient Instructions (Signed)
Follow-up with Rheumatology and Gynecology

## 2021-12-31 NOTE — Progress Notes (Signed)
Acute Office Visit  Subjective:     Patient ID: Maria Smith, female    DOB: 07/16/1995, 26 y.o.   MRN: [id number]  Chief Complaint  Patient presents with   Chest Pain    Noted since last night, states she was lifting heavy items at work 2 days prior   Follow-up    Patient presents for emergency room follow up due to Patient complains of numbness and tingling sensation left calf x5 days, went to the ER and left due to wait time and prior to results being discussed     HPI Patient is in today for a hospital follow-up from last night. Patient presented with numbness and tingling in her left calf x 5 days. She reports leaving the hospital prior to being seen or getting lab results. Also reports sugar cravings, fatigue, chest pain, calf tingling. She has been referred to rheumatology in the past for a positive ANA. She has not followed up. She denies any feelings of helplessness, hopelessness, no thoughts of death or dying. Reports having a great support system in her wife. She has 2 toddler children.  Review of Systems  Cardiovascular:  Positive for chest pain.  Musculoskeletal:        Cramping  Neurological:  Positive for tingling.       Numbness and tingling in the calfs  Psychiatric/Behavioral: Negative.    All other systems reviewed and are negative.   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 Rifampin since 08/2015   Streptococcal infection(041.00)    Age 66 and 26 years old   TB lung, latent    2017   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/2018   Diffuse enlargement on [place] 6/20    Social History   Socioeconomic History   Marital status: Married    Spouse name: Not on file    Number of children: 2   Years of education: Not on file   Highest education level: Bachelor's degree (e.g., BA, AB, BS)  Occupational History   Occupation: dietary  Tobacco Use   Smoking status: Never   Smokeless tobacco: Never  Vaping Use   Vaping Use: Never used  Substance and Sexual Activity   Alcohol use: Not Currently   Drug use: No   Sexual activity: Yes    Partners: Female    Comment: female partner  Other Topics Concern   Not on file  Social History Narrative   Patient works at [employer]   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    Social Determinants of Health   Financial Resource Strain: Low Risk  (08/29/2021)   Overall Financial Resource Strain (CARDIA)    Difficulty of Paying Living Expenses: Not hard at all  Food Insecurity: No Food Insecurity (08/29/2021)   Hunger Vital Sign    Worried About Running Out of Food in the Last Year: Never true    Ran Out of Food in the Last Year:  Never true  Transportation Needs: No Transportation Needs (08/29/2021)   PRAPARE - Hydrologist (Medical): No    Lack of Transportation (Non-Medical): No  Physical Activity: Insufficiently Active (08/29/2021)   Exercise Vital Sign    Days of Exercise per Week: 2 days    Minutes of Exercise per Session: 30 min  Stress: No Stress Concern Present (08/29/2021)   [place]    Feeling of Stress : Not at all  Social Connections: Unknown (08/29/2021)   Social Connection and Isolation Panel [NHANES]    Frequency of Communication with Friends and Family: More than three times a week    Frequency of Social Gatherings with Friends and Family: Patient refused    Attends Religious Services: Patient refused    Marine scientist or Organizations: No    Attends Music therapist: Not on file     Marital Status: Married  Human resources officer Violence: Not on file    Past Surgical History:  Procedure Laterality Date   TONSILLECTOMY  2012   WISDOM TOOTH EXTRACTION      Family History  Problem Relation Age of Onset   Migraines Paternal Grandmother    Migraines Paternal Grandfather    HIV Father    Diabetes Mother    Hypertension Mother    Anemia Mother    COPD Mother    CVA Mother    Congestive Heart Failure Mother    Cirrhosis Mother    Obesity Mother    Irritable bowel syndrome Mother    Rheum arthritis Mother    Ulcers Brother    Anemia Sister    Cholecystitis Brother    Migraines Paternal Aunt    Migraines Maternal Uncle        Maternal Great Uncle   Diabetes Other        Family History   Hypertension Other        Family History   Depression Other        Family History   Asthma Other        Family History   Allergic rhinitis Other        Family History   Asthma Brother    Colon cancer Neg Hx    Colon polyps Neg Hx    Esophageal cancer Neg Hx    Rectal cancer Neg Hx    Stomach cancer Neg Hx     No Known Allergies  Current Outpatient Medications on File Prior to Visit  Medication Sig Dispense Refill   albuterol (PROVENTIL) (2.5 MG/3ML) 0.083% nebulizer solution Take 3 mLs (2.5 mg total) by nebulization every 6 (six) hours as needed for wheezing or shortness of breath. 150 mL 1   albuterol (VENTOLIN HFA) 108 (90 Base) MCG/ACT inhaler Inhale 2 puffs into the lungs every 6 (six) hours as needed for wheezing or shortness of breath. 8 g 0   Ascorbic Acid (VITAMIN C) 1000 MG tablet Take 1,000 mg by mouth daily.     cetirizine (ZYRTEC) 10 MG tablet Take 1 tablet (10 mg total) by mouth daily. 90 tablet 3   fluticasone (FLONASE) 50 MCG/ACT nasal spray Place 2 sprays into both nostrils daily. 16 g 0   fluticasone (FLOVENT HFA) 44 MCG/ACT inhaler Inhale 2 puffs into the lungs in the morning and at bedtime. 1 each 0   Multiple Vitamin (MULTIVITAMIN) tablet Take 1  tablet by mouth daily.     Olopatadine HCl  0.2 % SOLN INSTILL 1 DROP INTO AFFECTED EYE EVERY DAY 7.5 mL 1   omeprazole (PRILOSEC) 40 MG capsule Take 1 capsule (40 mg total) by mouth 2 (two) times daily. 60 capsule 3   Respiratory Therapy Supplies (NEBULIZER/TUBING/MOUTHPIECE) KIT Use as directed. 1 kit 3   [DISCONTINUED] levocetirizine (XYZAL) 5 MG tablet TAKE 1 TABLET BY MOUTH EVERY DAY IN THE EVENING 30 tablet 0   No current facility-administered medications on file prior to visit.    BP 94/60 (BP Location: Left Arm, Patient Position: Sitting, Cuff Size: Normal)   Pulse 77   Temp 98.2 F (36.8 C) (Oral)   Ht 4' 11"  (1.499 m)   Wt 117 lb 6.4 oz (53.3 kg)   LMP 11/19/2021 (Exact Date)   SpO2 100%   BMI 23.71 kg/m chart     Objective:    BP 94/60 (BP Location: Left Arm, Patient Position: Sitting, Cuff Size: Normal)   Pulse 77   Temp 98.2 F (36.8 C) (Oral)   Ht 4' 11"  (1.499 m)   Wt 117 lb 6.4 oz (53.3 kg)   LMP 11/19/2021 (Exact Date)   SpO2 100%   BMI 23.71 kg/m    Physical Exam Vitals and nursing note reviewed.  Constitutional:      Appearance: She is well-developed.  Cardiovascular:     Rate and Rhythm: Normal rate and regular rhythm.     Heart sounds: Normal heart sounds.  Pulmonary:     Effort: Pulmonary effort is normal.     Breath sounds: Normal breath sounds.  Abdominal:     General: Bowel sounds are normal.     Palpations: Abdomen is soft.  Musculoskeletal:        General: Normal range of motion.  Skin:    General: Skin is warm and dry.  Neurological:     General: No focal deficit present.     Mental Status: She is alert and oriented to person, place, and time.  Psychiatric:        Mood and Affect: Mood normal.        Behavior: Behavior normal.     Results for orders placed or performed during the hospital encounter of 12/29/21  CBC with Differential  Result Value Ref Range   WBC 7.3 4.0 - 10.5 K/uL   RBC 4.37 3.87 - 5.11 MIL/uL   Hemoglobin  12.3 12.0 - 15.0 g/dL   HCT 37.9 36.0 - 46.0 %   MCV 86.7 80.0 - 100.0 fL   MCH 28.1 26.0 - 34.0 pg   MCHC 32.5 30.0 - 36.0 g/dL   RDW 12.2 11.5 - 15.5 %   Platelets 294 150 - 400 K/uL   nRBC 0.0 0.0 - 0.2 %   Neutrophils Relative % 56 %   Neutro Abs 4.1 1.7 - 7.7 K/uL   Lymphocytes Relative 36 %   Lymphs Abs 2.6 0.7 - 4.0 K/uL   Monocytes Relative 6 %   Monocytes Absolute 0.5 0.1 - 1.0 K/uL   Eosinophils Relative 1 %   Eosinophils Absolute 0.1 0.0 - 0.5 K/uL   Basophils Relative 1 %   Basophils Absolute 0.0 0.0 - 0.1 K/uL   Immature Granulocytes 0 %   Abs Immature Granulocytes 0.01 0.00 - 0.07 K/uL  Comprehensive metabolic panel  Result Value Ref Range   Sodium 138 135 - 145 mmol/L   Potassium 3.5 3.5 - 5.1 mmol/L   Chloride 108 98 - 111 mmol/L   CO2 27 22 - 32  mmol/L   Glucose, Bld 105 (H) 70 - 99 mg/dL   BUN 7 6 - 20 mg/dL   Creatinine, Ser 0.86 0.44 - 1.00 mg/dL   Calcium 9.5 8.9 - 10.3 mg/dL   Total Protein 7.1 6.5 - 8.1 g/dL   Albumin 4.0 3.5 - 5.0 g/dL   AST 19 15 - 41 U/L   ALT 11 0 - 44 U/L   Alkaline Phosphatase 37 (L) 38 - 126 U/L   Total Bilirubin 0.6 0.3 - 1.2 mg/dL   GFR, Estimated >60 >60 mL/min   Anion gap 3 (L) 5 - 15  Urinalysis, Routine w reflex microscopic Urine, Clean Catch  Result Value Ref Range   Color, Urine STRAW (A) YELLOW   APPearance CLEAR (A) CLEAR   Specific Gravity, Urine 1.003 (L) 1.005 - 1.030   pH 8.0 5.0 - 8.0   Glucose, UA NEGATIVE NEGATIVE mg/dL   Hgb urine dipstick NEGATIVE NEGATIVE   Bilirubin Urine NEGATIVE NEGATIVE   Ketones, ur NEGATIVE NEGATIVE mg/dL   Protein, ur NEGATIVE NEGATIVE mg/dL   Nitrite NEGATIVE NEGATIVE   Leukocytes,Ua NEGATIVE NEGATIVE  POC Urine Pregnancy, ED  Result Value Ref Range   Preg Test, Ur Negative Negative  Troponin I (High Sensitivity)  Result Value Ref Range   Troponin I (High Sensitivity) 3 <18 ng/L        Assessment & Plan:   Problem List Items Addressed This Visit     Positive ANA  (antinuclear antibody) - Primary   Other Visit Diagnoses     Calf tenderness       Other fatigue          Encouraged patient to follow-up with rheumatology as she may have an underlying autoimmune condition vs Fibromyalgia.    No orders of the defined types were placed in this encounter.   No follow-ups on file.  Kennyth Arnold, FNP

## 2022-01-09 ENCOUNTER — Ambulatory Visit
Admission: RE | Admit: 2022-01-09 | Discharge: 2022-01-09 | Disposition: A | Discharge status: Home / Self Care | Payer: Commercial Managed Care - HMO | Source: Ambulatory Visit | Attending: Physician Assistant | Admitting: Physician Assistant

## 2022-01-09 DIAGNOSIS — M25552 Pain in left hip: Secondary | ICD-10-CM

## 2022-01-09 MED ORDER — IOPAMIDOL (ISOVUE-M 200) INJECTION 41%
12.0000 mL | Freq: Once | INTRAMUSCULAR | Status: AC
  Administered 2022-01-09: 12 mL via INTRA_ARTICULAR

## 2022-01-11 ENCOUNTER — Other Ambulatory Visit: Payer: Self-pay

## 2022-01-11 ENCOUNTER — Emergency Department (HOSPITAL_BASED_OUTPATIENT_CLINIC_OR_DEPARTMENT_OTHER)
Admission: EM | Admit: 2022-01-11 | Discharge: 2022-01-11 | Discharge status: Against Medical Advice | Payer: Commercial Managed Care - HMO | Attending: Emergency Medicine | Admitting: Emergency Medicine

## 2022-01-11 ENCOUNTER — Encounter (HOSPITAL_BASED_OUTPATIENT_CLINIC_OR_DEPARTMENT_OTHER): Payer: Self-pay | Admitting: Emergency Medicine

## 2022-01-11 ENCOUNTER — Telehealth: Payer: Self-pay

## 2022-01-11 DIAGNOSIS — M25552 Pain in left hip: Secondary | ICD-10-CM | POA: Diagnosis not present

## 2022-01-11 DIAGNOSIS — Z5321 Procedure and treatment not carried out due to patient leaving prior to being seen by health care provider: Secondary | ICD-10-CM | POA: Insufficient documentation

## 2022-01-11 MED ORDER — ACETAMINOPHEN 160 MG/5ML PO SOLN
650.0000 mg | Freq: Once | ORAL | Status: AC
  Administered 2022-01-11: 650 mg via ORAL
  Filled 2022-01-11: qty 20.3

## 2022-01-11 NOTE — Telephone Encounter (Signed)
Returned pt's phone call to discuss recent imaging results. Pt inquired about prognosis for her current condition and her imaging results. Informed pt to refer to her sports doctor to whom she was referred by her PCP to discuss treatment alternatives and prognosis. She reports understanding of this plan.

## 2022-01-11 NOTE — ED Triage Notes (Signed)
Pt c/o left hip pain. Pt states that she had a MRI and was diagnosed with a tear in her cartilage. Pt has been to PT. Pt states she worked today and did too much and now is in a lot of pain. Last dose of ibuprofen x 5 hours ago.

## 2022-01-12 ENCOUNTER — Other Ambulatory Visit: Payer: Self-pay | Admitting: Physician Assistant

## 2022-01-12 ENCOUNTER — Telehealth: Payer: Self-pay | Admitting: Physician Assistant

## 2022-01-12 DIAGNOSIS — M25552 Pain in left hip: Secondary | ICD-10-CM

## 2022-01-12 MED ORDER — MELOXICAM 15 MG PO TABS: 15.0000 mg | ORAL_TABLET | Freq: Every day | ORAL | 0 refills | Status: DC

## 2022-01-12 NOTE — Telephone Encounter (Signed)
Pt called requesting a call from PA Persons. She states she is having right hip pains and need advice. Please call pt at [phone].

## 2022-01-17 ENCOUNTER — Ambulatory Visit: Payer: Commercial Managed Care - HMO | Admitting: Family Medicine

## 2022-01-25 ENCOUNTER — Ambulatory Visit (INDEPENDENT_AMBULATORY_CARE_PROVIDER_SITE_OTHER): Payer: Commercial Managed Care - HMO | Admitting: Orthopaedic Surgery

## 2022-01-25 ENCOUNTER — Encounter (HOSPITAL_BASED_OUTPATIENT_CLINIC_OR_DEPARTMENT_OTHER): Payer: Self-pay | Admitting: Orthopaedic Surgery

## 2022-01-25 DIAGNOSIS — S73192A Other sprain of left hip, initial encounter: Secondary | ICD-10-CM

## 2022-01-25 NOTE — Progress Notes (Signed)
Chief Complaint: Left hip pain     History of Present Illness:    Maria Smith Seen is a 26 y.o. female presents today with ongoing left hip pain that has been going on for several years.  This has been very disabling and most activities.  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  She has pain in most activities of daily living.  She is very concerned about potentially caring for her child in the future as well.  She states that she feels popping and clicking of the left hip and the hip often times feels like it is going to give out.  He describes the pain as a C-shaped distribution around the hip.  She has previously had 2 months of physical therapy with strengthening of the left hip which did not help significantly.  He has never had any injections.  She has trialed anti-inflammatory medications as well without any significant long-lasting relief.  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 Rifampin since 08/2015   Streptococcal infection(041.00)    Age 70 and 26 years old   TB lung, latent    2017   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/2018   Diffuse enlargement on [place] 6/20   Past Surgical History:  Procedure Laterality Date   TONSILLECTOMY  2012   WISDOM TOOTH EXTRACTION     Social History   Socioeconomic History   Marital status: Married    Spouse name: Not on file   Number of children: 2   Years of  education: Not on file   Highest education level: Bachelor's degree (e.g., BA, AB, BS)  Occupational History   Occupation: dietary  Tobacco Use   Smoking status: Never   Smokeless tobacco: Never  Vaping Use   Vaping Use: Never used  Substance and Sexual Activity   Alcohol use: Not Currently   Drug use: No   Sexual activity: Yes    Partners: Female    Comment: female partner  Other Topics Concern   Not on file  Social History Narrative   Patient works at [employer]   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    Social Determinants of Health   Financial Resource Strain: Low Risk  (08/29/2021)   Overall Financial Resource Strain (CARDIA)    Difficulty of Paying Living Expenses: Not hard at all  Food Insecurity: No Food Insecurity (08/29/2021)   Hunger Vital Sign    Worried About Running Out of Food in the Last Year: Never true  Ran Out of Food in the Last Year: Never true  Transportation Needs: No Transportation Needs (08/29/2021)   PRAPARE - Hydrologist (Medical): No    Lack of Transportation (Non-Medical): No  Physical Activity: Insufficiently Active (08/29/2021)   Exercise Vital Sign    Days of Exercise per Week: 2 days    Minutes of Exercise per Session: 30 min  Stress: No Stress Concern Present (08/29/2021)   [place]    Feeling of Stress : Not at all  Social Connections: Unknown (08/29/2021)   Social Connection and Isolation Panel [NHANES]    Frequency of Communication with Friends and Family: More than three times a week    Frequency of Social Gatherings with Friends and Family: Patient refused    Attends Religious Services: Patient refused    Marine scientist or Organizations: No    Attends Music therapist: Not on file    Marital Status: Married   Family  History  Problem Relation Age of Onset   Migraines Paternal Grandmother    Migraines Paternal Grandfather    HIV Father    Diabetes Mother    Hypertension Mother    Anemia Mother    COPD Mother    CVA Mother    Congestive Heart Failure Mother    Cirrhosis Mother    Obesity Mother    Irritable bowel syndrome Mother    Rheum arthritis Mother    Ulcers Brother    Anemia Sister    Cholecystitis Brother    Migraines Paternal Aunt    Migraines Maternal Uncle        Maternal Great Uncle   Diabetes Other        Family History   Hypertension Other        Family History   Depression Other        Family History   Asthma Other        Family History   Allergic rhinitis Other        Family History   Asthma Brother    Colon cancer Neg Hx    Colon polyps Neg Hx    Esophageal cancer Neg Hx    Rectal cancer Neg Hx    Stomach cancer Neg Hx    No Known Allergies Current Outpatient Medications  Medication Sig Dispense Refill   albuterol (PROVENTIL) (2.5 MG/3ML) 0.083% nebulizer solution Take 3 mLs (2.5 mg total) by nebulization every 6 (six) hours as needed for wheezing or shortness of breath. 150 mL 1   albuterol (VENTOLIN HFA) 108 (90 Base) MCG/ACT inhaler Inhale 2 puffs into the lungs every 6 (six) hours as needed for wheezing or shortness of breath. 8 g 0   Ascorbic Acid (VITAMIN C) 1000 MG tablet Take 1,000 mg by mouth daily.     cetirizine (ZYRTEC) 10 MG tablet Take 1 tablet (10 mg total) by mouth daily. 90 tablet 3   fluticasone (FLONASE) 50 MCG/ACT nasal spray Place 2 sprays into both nostrils daily. 16 g 0   fluticasone (FLOVENT HFA) 44 MCG/ACT inhaler Inhale 2 puffs into the lungs in the morning and at bedtime. 1 each 0   meloxicam (MOBIC) 15 MG tablet Take 1 tablet (15 mg total) by mouth daily. 30 tablet 0   Multiple Vitamin (MULTIVITAMIN) tablet Take 1 tablet by mouth daily.     Olopatadine HCl 0.2 % SOLN INSTILL 1 DROP INTO AFFECTED EYE EVERY DAY  7.5 mL 1   omeprazole  (PRILOSEC) 40 MG capsule Take 1 capsule (40 mg total) by mouth 2 (two) times daily. 60 capsule 3   Respiratory Therapy Supplies (NEBULIZER/TUBING/MOUTHPIECE) KIT Use as directed. 1 kit 3   No current facility-administered medications for this visit.   No results found.  Review of Systems:   A ROS was performed including pertinent positives and negatives as documented in the HPI.  Physical Exam :   Constitutional: NAD and appears stated age Neurological: Alert and oriented Psych: Appropriate affect and cooperative Last menstrual period 11/19/2021.   Comprehensive Musculoskeletal Exam:    Inspection Right Left  Skin No atrophy or gross abnormalities appreciated No atrophy or gross abnormalities appreciated  Palpation    Tenderness None Femoral acetabular  Crepitus None None  Range of Motion    Flexion (passive) 120 120  Extension 30 30  IR 30 30 with pain  ER 45 45  Strength    Flexion  5/5 5/5  Extension 5/5 5/5  Special Tests    FABER Negative Negative  FADIR Negative Painful  ER Lag/Capsular Insufficiency Negative Negative  Instability Negative Negative  Sacroiliac pain Negative  Negative   Instability    Generalized Laxity No Yes  Neurologic    sciatic, femoral, obturator nerves intact to light sensation  Vascular/Lymphatic    DP pulse 2+ 2+  Lumbar Exam    Patient has symmetric lumbar range of motion with negative pain referral to hip     Imaging:   Xray (pelvis 2 views left hip): Normal with lateral center edge angle of 30 degrees  MRI left hip: There is evidence of labral tearing of the anterior-inferior labrum  I personally reviewed and interpreted the radiographs.   Assessment:   26 y.o. 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  I described the labrum's role in providing a good suction fit of the hip.  At this time she remains quite disabled from the hip and most activities of daily  living she has significant pain.  She is not able to carry or play with her 56-year-old twins as result of the hip.  Given the fact that she has trialed physical therapy I did discuss neck step.  At today's visit I recommend an ultrasound-guided injection of lidocaine of the left hip so that we can surely rule in her hip as the primary pain generator.  Left hip injection was performed under ultrasound guidance with lidocaine only.  She did overall get good relief and as result we did have a surgical discussion.  I did discuss the role for arthroscopic labral repair and the specific complications and rehab that comes with this.  She understands that she would be in a brace postoperatively with a period of limited weightbearing.  After further discussion she would like to move towards this pending discussion with her work.  Plan :    -Plan for left hip arthroscopy with labral repair   After a lengthy discussion of treatment options, including risks, benefits, alternatives, complications of surgical and nonsurgical conservative options, the patient elected surgical repair.   The patient  is aware of the material risks  and complications including, but not limited to injury to adjacent structures, neurovascular injury, infection, numbness, bleeding, implant failure, thermal burns, stiffness, persistent pain, failure to heal, disease transmission from allograft, need for further surgery, dislocation, anesthetic risks, blood clots, risks of death,and others. The probabilities  of surgical success and failure discussed with patient given their particular co-morbidities.The time and nature of expected rehabilitation and recovery was discussed.The patient's questions were all answered preoperatively.  No barriers to understanding were noted. I explained the natural history of the disease process and Rx rationale.  I explained to the patient what I considered to be reasonable expectations given their personal  situation.  The final treatment plan was arrived at through a shared patient decision making process model.      I personally saw and evaluated the patient, and participated in the management and treatment plan.  Vanetta Mulders, MD Attending Physician, Orthopedic Surgery  This document was dictated using Dragon voice recognition software. A reasonable attempt at proof reading has been made to minimize errors.

## 2022-01-30 ENCOUNTER — Telehealth: Payer: Self-pay | Admitting: Orthopaedic Surgery

## 2022-01-30 NOTE — Telephone Encounter (Signed)
[employer] forms received. To [employer]. 

## 2022-01-31 ENCOUNTER — Other Ambulatory Visit (HOSPITAL_BASED_OUTPATIENT_CLINIC_OR_DEPARTMENT_OTHER): Payer: Self-pay | Admitting: Orthopaedic Surgery

## 2022-01-31 DIAGNOSIS — S73192A Other sprain of left hip, initial encounter: Secondary | ICD-10-CM

## 2022-02-11 ENCOUNTER — Ambulatory Visit (HOSPITAL_COMMUNITY)
Admission: EM | Admit: 2022-02-11 | Discharge: 2022-02-11 | Disposition: A | Discharge status: Home / Self Care | Payer: Commercial Managed Care - HMO | Attending: Physician Assistant | Admitting: Physician Assistant

## 2022-02-11 ENCOUNTER — Encounter (HOSPITAL_COMMUNITY): Payer: Self-pay

## 2022-02-11 DIAGNOSIS — K120 Recurrent oral aphthae: Secondary | ICD-10-CM | POA: Diagnosis not present

## 2022-02-11 DIAGNOSIS — K1370 Unspecified lesions of oral mucosa: Secondary | ICD-10-CM

## 2022-02-11 MED ORDER — IBUPROFEN 100 MG/5ML PO SUSP: 600.0000 mg | Freq: Three times a day (TID) | ORAL | 0 refills | Status: DC | PRN

## 2022-02-11 MED ORDER — NYSTATIN 100000 UNIT/ML MT SUSP: 5.0000 mL | Freq: Four times a day (QID) | OROMUCOSAL | 0 refills | Status: DC | PRN

## 2022-02-11 MED ORDER — ACETAMINOPHEN 160 MG/5ML PO SUSP: 1000.0000 mg | Freq: Three times a day (TID) | ORAL | 0 refills | Status: DC | PRN

## 2022-02-11 NOTE — Discharge Instructions (Signed)
I do not see any evidence of infection on warrant starting antibiotics.  Use Magic mouthwash up to 4 times a day.  Swish and spit this out.  You can use Tylenol and ibuprofen for pain relief.  I have sent in prescriptions for this to the pharmacy.  Gargle with warm salt water for additional symptom relief.  If you develop any additional lesions or other symptoms including rash, fever, difficulty swallowing, shortness of breath, swelling of her throat you need to be seen immediately.  Your symptoms are improving within a week please see [place] again or your primary care provider.

## 2022-02-11 NOTE — ED Provider Notes (Signed)
[place]    CSN: [id number] Arrival date & time: 02/11/22  1305      History   Chief Complaint Chief Complaint  Patient presents with   Rash    HPI Maria Smith is a 26 y.o. female.   Patient presents today with a 1 day history of painful lesion on her left tonsillar pillar.  Reports that a few days ago she bit the inside of her cheek and had been gargling with warm salt water to manage this.  Earlier today when she did that she noticed a very painful area on her left tonsillar pillar with associated lesion.  She denies any additional symptoms including fever, cough, congestion.  She is eating and drinking normally.  She has not tried any additional over-the-counter medications.  Denies any known sick contacts at work.  Does report that her child was recently diagnosed with hand-foot-and-mouth approximately a week ago.  Patient denies any rash.  She denies any history of immunosuppression does not take any immune modulating medication.    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 Rifampin since 08/2015   Streptococcal infection(041.00)    Age 62 and 26 years old   TB lung, latent    2017   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/2018   Diffuse enlargement on [place] 6/20    Patient Active Problem List   Diagnosis Date Noted   Pain in left hip 01/27/2021   Bruising 01/27/2021   Left-sided chest wall pain 03/23/2020   Positive ANA (antinuclear antibody) 03/23/2020   Asthma 02/10/2020   Allergic rhinitis 02/10/2020   GERD (gastroesophageal reflux disease) 02/10/2020   History of TB (tuberculosis) 08/21/2019   Throat pain 01/28/2019   Odynophagia 01/28/2019   Neck pain 01/28/2019    Thyroiditis 01/12/2019   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/2018   Fatty liver 12/11/2018   PCOS (polycystic ovarian syndrome) 11/11/2018   Seasonal allergies 09/22/2018   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   Migraine with aura and without status migrainosus, not intractable 09/11/2012   Variants of migraine, not elsewhere classified, without mention of intractable migraine without mention of status migrainosus 09/11/2012   Unspecified constipation 09/11/2012   Syncope 08/31/2010   Neck Pain 08/31/2010   Insomnia 08/31/2010    Past Surgical History:  Procedure Laterality Date   TONSILLECTOMY  2012   WISDOM TOOTH EXTRACTION      OB History     Gravida  1   Para  0   Term  0   Preterm  0   AB  0   Living  0      SAB  0   IAB  0   Ectopic  0   Multiple  0   Live Births  0            Home Medications    Prior to Admission medications   Medication Sig Start Date End Date Taking? Authorizing Provider  acetaminophen (TYLENOL CHILDRENS) 160 MG/5ML suspension Take 31.3 mLs (1,000 mg total) by mouth every 8 (eight) hours as needed. 02/11/22  Yes Hang Ammon, Derry Skill,  PA-C  ibuprofen (ADVIL) 100 MG/5ML suspension Take 30 mLs (600 mg total) by mouth every 8 (eight) hours as needed. 02/11/22  Yes Devin Foskey, Derry Skill, PA-C  magic mouthwash (nystatin, lidocaine, diphenhydrAMINE) suspension Take 5 mLs by mouth 4 (four) times daily as needed for mouth pain. Swish and spit 02/11/22  Yes Dee Paden K, PA-C  albuterol (PROVENTIL) (2.5 MG/3ML) 0.083% nebulizer solution Take 3 mLs (2.5 mg total) by nebulization every 6 (six) hours as needed for wheezing or shortness of breath. 04/24/21   Billie Ruddy, MD  albuterol (VENTOLIN HFA) 108 (90 Base) MCG/ACT inhaler Inhale 2 puffs into the lungs every 6 (six) hours as needed for wheezing or shortness of breath. 04/24/21   Billie Ruddy, MD  Ascorbic Acid (VITAMIN C) 1000 MG tablet Take 1,000 mg by mouth daily.    [provider]  cetirizine (ZYRTEC) 10 MG  tablet Take 1 tablet (10 mg total) by mouth daily. 10/12/21   Billie Ruddy, MD  fluticasone (FLONASE) 50 MCG/ACT nasal spray Place 2 sprays into both nostrils daily. 01/15/21   Scot Jun, FNP  fluticasone (FLOVENT HFA) 44 MCG/ACT inhaler Inhale 2 puffs into the lungs in the morning and at bedtime. 06/20/20   Lucretia Kern, DO  meloxicam (MOBIC) 15 MG tablet Take 1 tablet (15 mg total) by mouth daily. 01/12/22   Persons, Bevely Palmer, PA  Multiple Vitamin (MULTIVITAMIN) tablet Take 1 tablet by mouth daily.    [provider]  Olopatadine HCl 0.2 % SOLN INSTILL 1 DROP INTO AFFECTED EYE EVERY DAY 10/27/21   Billie Ruddy, MD  omeprazole (PRILOSEC) 40 MG capsule Take 1 capsule (40 mg total) by mouth 2 (two) times daily. 07/03/19   Billie Ruddy, MD  Respiratory Therapy Supplies (NEBULIZER/TUBING/MOUTHPIECE) KIT Use as directed. 04/24/21   Billie Ruddy, MD  levocetirizine (XYZAL) 5 MG tablet TAKE 1 TABLET BY MOUTH EVERY DAY IN THE EVENING 04/15/19 10/23/19  Billie Ruddy, MD    Family History Family History  Problem Relation Age of Onset   Migraines Paternal 29    Migraines Paternal Grandfather    HIV Father    Diabetes Mother    Hypertension Mother    Anemia Mother    COPD Mother    CVA Mother    Congestive Heart Failure Mother    Cirrhosis Mother    Obesity Mother    Irritable bowel syndrome Mother    Rheum arthritis Mother    Ulcers Brother    Anemia Sister    Cholecystitis Brother    Migraines Paternal Aunt    Migraines Maternal Uncle        Maternal Great Uncle   Diabetes Other        Family History   Hypertension Other        Family History   Depression Other        Family History   Asthma Other        Family History   Allergic rhinitis Other        Family History   Asthma Brother    Colon cancer Neg Hx    Colon polyps Neg Hx    Esophageal cancer Neg Hx    Rectal cancer Neg Hx    Stomach cancer Neg Hx     Social History Social  History   Tobacco Use   Smoking status: Never   Smokeless tobacco: Never  Vaping Use   Vaping Use: Never used  Substance Use Topics   Alcohol use: Not Currently   Drug use: No     Allergies   Patient has no known allergies.   Review of Systems Review of Systems  Constitutional:  Negative for activity change, appetite change, fatigue and fever.  HENT:  Positive for mouth sores, sore throat and trouble swallowing. Negative for congestion, sinus pressure, sneezing and voice change.   Respiratory:  Negative for cough and shortness of breath.   Cardiovascular:  Negative for chest pain.  Gastrointestinal:  Negative for abdominal pain, diarrhea, nausea and vomiting.     Physical Exam Triage Vital Signs ED Triage Vitals  Enc Vitals Group     BP 02/11/22 1409 (!) 131/97     Pulse Rate 02/11/22 1409 74     Resp 02/11/22 1409 12     Temp 02/11/22 1409 98.2 F (36.8 C)     Temp Source 02/11/22 1409 Oral     SpO2 02/11/22 1409 98 %     Weight 02/11/22 1407 116 lb (52.6 kg)     Height 02/11/22 1407 4' 11"  (1.499 m)     Head Circumference --      Peak Flow --      Pain Score 02/11/22 1407 7     Pain Loc --      Pain Edu? --      Excl. in [place]? --    No data found.  Updated Vital Signs BP (!) 131/97 (BP Location: Left Wrist)   Pulse 74   Temp 98.2 F (36.8 C) (Oral)   Resp 12   Ht 4' 11"  (1.499 m)   Wt 116 lb (52.6 kg)   LMP  (LMP Unknown)   SpO2 98%   BMI 23.43 kg/m   Visual Acuity Right Eye Distance:   Left Eye Distance:   Bilateral Distance:    Right Eye Near:   Left Eye Near:    Bilateral Near:     Physical Exam Vitals reviewed.  Constitutional:      General: She is awake. She is not in acute distress.    Appearance: Normal appearance. She is well-developed. She is not ill-appearing.     Comments: Very pleasant female appears stated age in no acute distress sitting comfortably in exam room  HENT:     Head: Normocephalic and atraumatic.     Right Ear:  Tympanic membrane, ear canal and external ear normal. Tympanic membrane is not erythematous or bulging.     Left Ear: Tympanic membrane, ear canal and external ear normal. Tympanic membrane is not erythematous or bulging.     Nose:     Right Sinus: No maxillary sinus tenderness or frontal sinus tenderness.     Left Sinus: No maxillary sinus tenderness or frontal sinus tenderness.     Mouth/Throat:     Tongue: No lesions.     Palate: Lesions present.     Pharynx: Uvula midline. No oropharyngeal exudate or posterior oropharyngeal erythema.      Comments: 0.5 cm ulcerated lesion with erythematous base noted left tonsillar pillar.  No erythema or exudate noted in posterior oropharynx.  No additional lesions noted. Cardiovascular:     Rate and Rhythm: Normal rate and regular rhythm.     Heart sounds: Normal heart sounds, S1 normal and S2 normal. No murmur heard. Pulmonary:     Effort: Pulmonary effort is normal.     Breath sounds: Normal breath sounds. No wheezing, rhonchi or rales.     Comments:  Clear to auscultation bilaterally Lymphadenopathy:     Head:     Right side of head: No submental, submandibular or tonsillar adenopathy.     Left side of head: No submental, submandibular or tonsillar adenopathy.     Cervical: No cervical adenopathy.  Skin:    Findings: No rash. Rash is not macular or papular.     Comments: No rash or lesions noted.  Psychiatric:        Behavior: Behavior is cooperative.      UC Treatments / Results  Labs (all labs ordered are listed, but only abnormal results are displayed) Labs Reviewed - No data to display  EKG   Radiology No results found.  Procedures Procedures (including critical care time)  Medications Ordered in UC Medications - No data to display  Initial Impression / Assessment and Plan / UC Course  I have reviewed the triage vital signs and the nursing notes.  Pertinent labs & imaging results that were available during my care of  the patient were reviewed by me and considered in my medical decision making (see chart for details).     Centor score of 1; rapid strep was deferred given clinical presentation.  Painful lesion appears to be aphthous ulcer.  Discussed that it is possible that this is the beginning of hand-foot-and-mouth as it is possible for adults to get it though less likely.  If that is the case it is a virus that will need to run its course.  She was provided symptomatic care including Magic mouthwash which she can swish and spit up to 4 times a day.  She was prescribed ibuprofen and Tylenol in liquid form per her request.  Discussed that she should rest and drink plenty of fluid.  If she develops any additional symptoms she should be reevaluated.  Strict return precautions given to which she expressed understanding.  Work excuse note provided.  Final Clinical Impressions(s) / UC Diagnoses   Final diagnoses:  Aphthous ulcer of mouth  Mouth lesion     Discharge Instructions      I do not see any evidence of infection on warrant starting antibiotics.  Use Magic mouthwash up to 4 times a day.  Swish and spit this out.  You can use Tylenol and ibuprofen for pain relief.  I have sent in prescriptions for this to the pharmacy.  Gargle with warm salt water for additional symptom relief.  If you develop any additional lesions or other symptoms including rash, fever, difficulty swallowing, shortness of breath, swelling of her throat you need to be seen immediately.  Your symptoms are improving within a week please see [place] again or your primary care provider.     ED Prescriptions     Medication Sig Dispense Auth. Provider   magic mouthwash (nystatin, lidocaine, diphenhydrAMINE) suspension Take 5 mLs by mouth 4 (four) times daily as needed for mouth pain. Swish and spit 180 mL Rue Tinnel K, PA-C   acetaminophen (TYLENOL CHILDRENS) 160 MG/5ML suspension Take 31.3 mLs (1,000 mg total) by mouth every 8 (eight)  hours as needed. 500 mL Angelo Caroll K, PA-C   ibuprofen (ADVIL) 100 MG/5ML suspension Take 30 mLs (600 mg total) by mouth every 8 (eight) hours as needed. 450 mL Joanette Silveria K, PA-C      PDMP not reviewed this encounter.   Terrilee Croak, PA-C 02/11/22 1453

## 2022-02-11 NOTE — ED Triage Notes (Signed)
Pt has a child whom had hand foot and mouth x1wk. Pt states she has a rash in the mouth that has been uncomfortable

## 2022-02-14 ENCOUNTER — Other Ambulatory Visit: Payer: Self-pay

## 2022-02-16 ENCOUNTER — Telehealth: Payer: Self-pay | Admitting: Family Medicine

## 2022-02-16 NOTE — Telephone Encounter (Signed)
Pt would like to transfer to dr [place]

## 2022-02-18 ENCOUNTER — Other Ambulatory Visit: Payer: Self-pay

## 2022-02-18 ENCOUNTER — Encounter (HOSPITAL_BASED_OUTPATIENT_CLINIC_OR_DEPARTMENT_OTHER): Payer: Self-pay

## 2022-02-18 DIAGNOSIS — J45909 Unspecified asthma, uncomplicated: Secondary | ICD-10-CM | POA: Diagnosis not present

## 2022-02-18 DIAGNOSIS — R519 Headache, unspecified: Secondary | ICD-10-CM | POA: Diagnosis present

## 2022-02-18 DIAGNOSIS — G43809 Other migraine, not intractable, without status migrainosus: Secondary | ICD-10-CM | POA: Insufficient documentation

## 2022-02-18 LAB — PREGNANCY, URINE: Preg Test, Ur: NEGATIVE

## 2022-02-18 NOTE — ED Triage Notes (Signed)
POV, pt c/o headache that started 4 days ago, tried Excedrin, tylenol, peppermint oil, and ice at home without relief. Endorses nausea and vomiting x 1 yesterday and photophobia. Alert and oriented x 4, amb to triage. Sts hx of migraines a couple years ago.   Last dose excedrin approx 2000

## 2022-02-19 ENCOUNTER — Emergency Department (HOSPITAL_BASED_OUTPATIENT_CLINIC_OR_DEPARTMENT_OTHER)
Admission: EM | Admit: 2022-02-19 | Discharge: 2022-02-19 | Disposition: A | Discharge status: Home / Self Care | Payer: Commercial Managed Care - HMO | Attending: Emergency Medicine | Admitting: Emergency Medicine

## 2022-02-19 DIAGNOSIS — G43809 Other migraine, not intractable, without status migrainosus: Secondary | ICD-10-CM

## 2022-02-19 MED ORDER — PROCHLORPERAZINE EDISYLATE 10 MG/2ML IJ SOLN
10.0000 mg | Freq: Once | INTRAMUSCULAR | Status: AC
  Administered 2022-02-19: 10 mg via INTRAVENOUS
  Filled 2022-02-19: qty 2

## 2022-02-19 MED ORDER — DIPHENHYDRAMINE HCL 50 MG/ML IJ SOLN
25.0000 mg | Freq: Once | INTRAMUSCULAR | Status: AC
  Administered 2022-02-19: 25 mg via INTRAVENOUS
  Filled 2022-02-19: qty 1

## 2022-02-19 NOTE — ED Notes (Signed)
Pt ambulatory from waiting room to exam room independently escorted by triage nurse; pt gait steady- no obvious distress.  Pt now lying in bed with lights dim- awaiting provider.

## 2022-02-19 NOTE — ED Provider Notes (Signed)
[facility] EMERGENCY DEPT Provider Note   CSN: [id number] Arrival date & time: 02/18/22  2322     History  Chief Complaint  Patient presents with   Migraine    Maria Smith is a 26 y.o. female.  HPI     This is a 26 year old female who presents with headache.  Patient reports 3 to 4-day history of worsening headache.  She has a history of headaches and migraine.  She has taken over-the-counter Excedrin with minimal relief.  She describes the headache as bitemporal and throbbing.  She reports photophobia.  No fevers or neck stiffness.  No weakness, numbness, strokelike symptoms.  Home Medications Prior to Admission medications   Medication Sig Start Date End Date Taking? Authorizing Provider  acetaminophen (TYLENOL CHILDRENS) 160 MG/5ML suspension Take 31.3 mLs (1,000 mg total) by mouth every 8 (eight) hours as needed. 02/11/22   Raspet, Erin K, PA-C  albuterol (PROVENTIL) (2.5 MG/3ML) 0.083% nebulizer solution Take 3 mLs (2.5 mg total) by nebulization every 6 (six) hours as needed for wheezing or shortness of breath. 04/24/21   Billie Ruddy, MD  albuterol (VENTOLIN HFA) 108 (90 Base) MCG/ACT inhaler Inhale 2 puffs into the lungs every 6 (six) hours as needed for wheezing or shortness of breath. 04/24/21   Billie Ruddy, MD  Ascorbic Acid (VITAMIN C) 1000 MG tablet Take 1,000 mg by mouth daily.    [provider]  cetirizine (ZYRTEC) 10 MG tablet Take 1 tablet (10 mg total) by mouth daily. 10/12/21   Billie Ruddy, MD  fluticasone (FLONASE) 50 MCG/ACT nasal spray Place 2 sprays into both nostrils daily. 01/15/21   Scot Jun, FNP  fluticasone (FLOVENT HFA) 44 MCG/ACT inhaler Inhale 2 puffs into the lungs in the morning and at bedtime. 06/20/20   Lucretia Kern, DO  ibuprofen (ADVIL) 100 MG/5ML suspension Take 30 mLs (600 mg total) by mouth every 8 (eight) hours as needed. 02/11/22   Raspet, Derry Skill, PA-C  magic mouthwash (nystatin, lidocaine,  diphenhydrAMINE) suspension Take 5 mLs by mouth 4 (four) times daily as needed for mouth pain. Swish and spit 02/11/22   Raspet, Junie Panning K, PA-C  meloxicam (MOBIC) 15 MG tablet Take 1 tablet (15 mg total) by mouth daily. 01/12/22   Persons, Bevely Palmer, PA  Multiple Vitamin (MULTIVITAMIN) tablet Take 1 tablet by mouth daily.    [provider]  Olopatadine HCl 0.2 % SOLN INSTILL 1 DROP INTO AFFECTED EYE EVERY DAY 10/27/21   Billie Ruddy, MD  omeprazole (PRILOSEC) 40 MG capsule Take 1 capsule (40 mg total) by mouth 2 (two) times daily. 07/03/19   Billie Ruddy, MD  Respiratory Therapy Supplies (NEBULIZER/TUBING/MOUTHPIECE) KIT Use as directed. 04/24/21   Billie Ruddy, MD  levocetirizine (XYZAL) 5 MG tablet TAKE 1 TABLET BY MOUTH EVERY DAY IN THE EVENING 04/15/19 10/23/19  Billie Ruddy, MD      Allergies    Patient has no known allergies.    Review of Systems   Review of Systems  Constitutional:  Negative for fever.  Neurological:  Positive for headaches.  All other systems reviewed and are negative.   Physical Exam Updated Vital Signs BP 127/78   Pulse 65   Temp 98.6 F (37 C) (Oral)   Resp 16   Ht 1.499 m (_0 )   Wt 53.1 kg   LMP 02/04/2022   SpO2 99%   BMI 23.63 kg/m  Physical Exam Vitals and nursing note  reviewed.  Constitutional:      Appearance: She is well-developed. She is not ill-appearing.  HENT:     Head: Normocephalic and atraumatic.  Eyes:     Pupils: Pupils are equal, round, and reactive to light.  Cardiovascular:     Rate and Rhythm: Normal rate and regular rhythm.     Heart sounds: Normal heart sounds.  Pulmonary:     Effort: Pulmonary effort is normal. No respiratory distress.     Breath sounds: No wheezing.  Abdominal:     General: Bowel sounds are normal.     Palpations: Abdomen is soft.  Musculoskeletal:     Cervical back: Neck supple.  Skin:    General: Skin is warm and dry.  Neurological:     Mental Status: She is alert and  oriented to person, place, and time.     Comments: Cranial nerves II through XII intact, 5 out of 5 strength in all 4 extremities, no dysmetria to finger-nose-finger  Psychiatric:        Mood and Affect: Mood normal.     ED Results / Procedures / Treatments   Labs (all labs ordered are listed, but only abnormal results are displayed) Labs Reviewed  PREGNANCY, URINE    EKG None  Radiology No results found.  Procedures Procedures    Medications Ordered in ED Medications  prochlorperazine (COMPAZINE) injection 10 mg (10 mg Intravenous Given 02/19/22 0219)  diphenhydrAMINE (BENADRYL) injection 25 mg (25 mg Intravenous Given 02/19/22 0225)    ED Course/ Medical Decision Making/ A&P                           Medical Decision Making Amount and/or Complexity of Data Reviewed Labs: ordered.  Risk Prescription drug management.   This patient presents to the ED for concern of headache, this involves an extensive number of treatment options, and is a complaint that carries with it a high risk of complications and morbidity.  I considered the following differential and admission for this acute, potentially life threatening condition.  The differential diagnosis includes migraine, tension headache, less likely subarachnoid hemorrhage or meningitis  MDM:    This is a 26 year old female who presents with headache.  History of the same.  She is nontoxic and vital signs are reassuring.  No red flags of headache.  No fevers or neck stiffness to suggest meningitis.  Denies worst headache of her life.  She is nonfocal on exam.  She was given a migraine cocktail.  Urine pregnancy test negative.  Patient had progressive improvement of symptoms after treatment.  Have very low suspicion for subarachnoid hemorrhage.  Recommend ongoing supportive measures at home.  (Labs, imaging, consults)  Labs: I Ordered, and personally interpreted labs.  The pertinent results include: Negative urine  pregnancy  Imaging Studies ordered: I ordered imaging studies including none I independently visualized and interpreted imaging. I agree with the radiologist interpretation  Additional history obtained from chart review.  External records from outside source obtained and reviewed including prior evaluations  Cardiac Monitoring: The patient was maintained on a cardiac monitor.  I personally viewed and interpreted the cardiac monitored which showed an underlying rhythm of: Normal sinus rhythm  Reevaluation: After the interventions noted above, I reevaluated the patient and found that they have :improved  Social Determinants of Health: Lives independently  Disposition: Discharge  Co morbidities that complicate the patient evaluation  Past Medical History:  Diagnosis Date   Allergic  rhinitis    Allergy    SEASONAL   Asthma    Eczema    Environmental allergies    Age 65 years   Fatty liver 12/11/2018   Noted on CT 6/20   Generalized headaches    Began at age 47 years   GERD (gastroesophageal reflux disease)    Influenza    Age 46 years   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 Rifampin since 08/2015   Streptococcal infection(041.00)    Age 11 and 26 years old   TB lung, latent    2017   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/2018   Diffuse enlargement on [place] 6/20     Medicines Meds ordered this encounter  Medications   prochlorperazine (COMPAZINE) injection 10 mg   diphenhydrAMINE (BENADRYL) injection 25 mg    I have reviewed the patients home medicines and have made adjustments as needed  Problem List / ED Course: Problem List Items Addressed This Visit   None Visit Diagnoses     Other migraine without status migrainosus, not intractable    -  Primary                   Final Clinical Impression(s) / ED Diagnoses Final diagnoses:  Other migraine without status migrainosus, not intractable    Rx / DC Orders ED Discharge Orders     None         Merryl Hacker,  MD 02/19/22 [phone]

## 2022-02-21 NOTE — Telephone Encounter (Signed)
Ok

## 2022-02-26 NOTE — Telephone Encounter (Signed)
Ok with me. BJ 

## 2022-02-27 NOTE — Telephone Encounter (Signed)
Okay to schedule, thank you! 

## 2022-03-01 NOTE — Telephone Encounter (Signed)
Pt has been sch

## 2022-03-02 NOTE — Pre-Procedure Instructions (Signed)
Surgical Instructions    Your procedure is scheduled on March 20, 2022.  Report to [facility] Main Entrance "A" at 5:30 A.M., then check in with the Admitting office.  Call this number if you have problems the morning of surgery:  [phone]   If you have any questions prior to your surgery date call [phone]: Open Monday-Friday 8am-4pm    Remember:  Do not eat after midnight the night before your surgery  You may drink clear liquids until 4:30 AM the morning of your surgery.   Clear liquids allowed are: Water, Non-Citrus Juices (without pulp), Carbonated Beverages, Clear Tea, Black Coffee Only (NO MILK, CREAM OR POWDERED CREAMER of any kind), and Gatorade.      Take these medicines the morning of surgery with A SIP OF WATER:  ferrous sulfate     Take these medicines the morning of surgery AS NEEDED:  albuterol (PROVENTIL) Nebulizer Solution  albuterol (VENTOLIN HFA) inhaler - bring with you to hospital morning of surgery  cetirizine (ZYRTEC)   As of today, STOP taking any Aspirin (unless otherwise instructed by your surgeon) Aleve, Naproxen, Ibuprofen, Motrin, Advil, Goody's, BC's, all herbal medications, fish oil, and all vitamins. This includes your medication: meloxicam (MOBIC).                     Do NOT Smoke (Tobacco/Vaping) for 24 hours prior to your procedure.  If you use a CPAP at night, you may bring your mask/headgear for your overnight stay.   Contacts, glasses, piercing's, hearing aid's, dentures or partials may not be worn into surgery, please bring cases for these belongings.    For patients admitted to the hospital, discharge time will be determined by your treatment team.   Patients discharged the day of surgery will not be allowed to drive home, and someone needs to stay with them for 24 hours.  SURGICAL WAITING ROOM VISITATION Patients having surgery or a procedure may have no more than 2 support people in the waiting area - these visitors may  rotate.   Children under the age of 92 must have an adult with them who is not the patient. If the patient needs to stay at the hospital during part of their recovery, the visitor guidelines for inpatient rooms apply. Pre-op nurse will coordinate an appropriate time for 1 support person to accompany patient in pre-op.  This support person may not rotate.   Please refer to the [facility] website for the visitor guidelines for Inpatients (after your surgery is over and you are in a regular room).    Special instructions:   - Preparing For Surgery  Before surgery, you can play an important role. Because skin is not sterile, your skin needs to be as free of germs as possible. You can reduce the number of germs on your skin by washing with CHG (chlorahexidine gluconate) Soap before surgery.  CHG is an antiseptic cleaner which kills germs and bonds with the skin to continue killing germs even after washing.    Oral Hygiene is also important to reduce your risk of infection.  Remember - BRUSH YOUR TEETH THE MORNING OF SURGERY WITH YOUR REGULAR TOOTHPASTE  Please do not use if you have an allergy to CHG or antibacterial soaps. If your skin becomes reddened/irritated stop using the CHG.  Do not shave (including legs and underarms) for at least 48 hours prior to first CHG shower. It is OK to shave your face.  Please follow these  instructions carefully.   Shower the NIGHT BEFORE SURGERY and the MORNING OF SURGERY  If you chose to wash your hair, wash your hair first as usual with your normal shampoo.  After you shampoo, rinse your hair and body thoroughly to remove the shampoo.  Use CHG Soap as you would any other liquid soap. You can apply CHG directly to the skin and wash gently with a scrungie or a clean washcloth.   Apply the CHG Soap to your body ONLY FROM THE NECK DOWN.  Do not use on open wounds or open sores. Avoid contact with your eyes, ears, mouth and genitals (private parts).  Wash Face and genitals (private parts)  with your normal soap.   Wash thoroughly, paying special attention to the area where your surgery will be performed.  Thoroughly rinse your body with warm water from the neck down.  DO NOT shower/wash with your normal soap after using and rinsing off the CHG Soap.  Pat yourself dry with a CLEAN TOWEL.  Wear CLEAN PAJAMAS to bed the night before surgery  Place CLEAN SHEETS on your bed the night before your surgery  DO NOT SLEEP WITH PETS.   Day of Surgery: Take a shower with CHG soap. Do not wear jewelry or makeup Do not wear lotions, powders, perfumes, or deodorant. Do not shave 48 hours prior to surgery.   Do not bring valuables to the hospital.  [facility] is not responsible for any belongings or valuables. Do not wear nail polish, gel polish, artificial nails, or any other type of covering on natural nails (fingers and toes) If you have artificial nails or gel coating that need to be removed by a nail salon, please have this removed prior to surgery. Artificial nails or gel coating may interfere with anesthesia's ability to adequately monitor your vital signs.  Wear Clean/Comfortable clothing the morning of surgery Remember to brush your teeth WITH YOUR REGULAR TOOTHPASTE.   Please read over the following fact sheets that you were given.    If you received a COVID test during your pre-op visit  it is requested that you wear a mask when out in public, stay away from anyone that may not be feeling well and notify your surgeon if you develop symptoms. If you have been in contact with anyone that has tested positive in the last 10 days please notify you surgeon.

## 2022-03-05 ENCOUNTER — Encounter (HOSPITAL_COMMUNITY)
Admission: RE | Admit: 2022-03-05 | Discharge: 2022-03-05 | Disposition: A | Discharge status: Home / Self Care | Payer: Commercial Managed Care - HMO | Source: Ambulatory Visit | Attending: Orthopaedic Surgery | Admitting: Orthopaedic Surgery

## 2022-03-05 ENCOUNTER — Encounter (HOSPITAL_COMMUNITY): Payer: Self-pay

## 2022-03-05 ENCOUNTER — Ambulatory Visit (HOSPITAL_BASED_OUTPATIENT_CLINIC_OR_DEPARTMENT_OTHER): Payer: Self-pay | Admitting: Orthopaedic Surgery

## 2022-03-05 ENCOUNTER — Other Ambulatory Visit: Payer: Self-pay

## 2022-03-05 VITALS — BP 120/65 | HR 76 | Temp 98.5°F | Resp 17 | Ht 59.0 in | Wt 116.2 lb

## 2022-03-05 DIAGNOSIS — K769 Liver disease, unspecified: Secondary | ICD-10-CM | POA: Diagnosis not present

## 2022-03-05 DIAGNOSIS — S73192A Other sprain of left hip, initial encounter: Secondary | ICD-10-CM

## 2022-03-05 DIAGNOSIS — Z01812 Encounter for preprocedural laboratory examination: Secondary | ICD-10-CM | POA: Diagnosis present

## 2022-03-05 DIAGNOSIS — Z01818 Encounter for other preprocedural examination: Secondary | ICD-10-CM

## 2022-03-05 DIAGNOSIS — Z9221 Personal history of antineoplastic chemotherapy: Secondary | ICD-10-CM

## 2022-03-05 HISTORY — DX: Other complications of anesthesia, initial encounter: T88.59XA

## 2022-03-05 HISTORY — DX: Family history of other specified conditions: Z84.89

## 2022-03-05 LAB — COMPREHENSIVE METABOLIC PANEL
ALT: 13 U/L (ref 0–44)
AST: 23 U/L (ref 15–41)
Albumin: 4 g/dL (ref 3.5–5.0)
Alkaline Phosphatase: 40 U/L (ref 38–126)
Anion gap: 6 (ref 5–15)
BUN: <5 mg/dL — ABNORMAL LOW (ref 6–20)
CO2: 26 mmol/L (ref 22–32)
Calcium: 9.6 mg/dL (ref 8.9–10.3)
Chloride: 106 mmol/L (ref 98–111)
Creatinine, Ser: 0.79 mg/dL (ref 0.44–1.00)
GFR, Estimated: >60 mL/min (ref >60)
Glucose, Bld: 100 mg/dL — ABNORMAL HIGH (ref 70–99)
Potassium: 3.7 mmol/L (ref 3.5–5.1)
Sodium: 138 mmol/L (ref 135–145)
Total Bilirubin: 0.8 mg/dL (ref 0.3–1.2)
Total Protein: 7.2 g/dL (ref 6.5–8.1)

## 2022-03-05 LAB — CBC
HCT: 38.6 % (ref 36.0–46.0)
Hemoglobin: 12.9 g/dL (ref 12.0–15.0)
MCH: 28.7 pg (ref 26.0–34.0)
MCHC: 33.4 g/dL (ref 30.0–36.0)
MCV: 85.8 fL (ref 80.0–100.0)
Platelets: 361 10*3/uL (ref 150–400)
RBC: 4.5 MIL/uL (ref 3.87–5.11)
RDW: 12.4 % (ref 11.5–15.5)
WBC: 4.9 10*3/uL (ref 4.0–10.5)
nRBC: 0 % (ref 0.0–0.2)

## 2022-03-05 NOTE — Progress Notes (Signed)
PCP - Grier Mitts, MD Cardiologist - Pt saw Dr. Gardiner Rhyme a few years ago for palpitations/CP. Cardiac work-up negative. Not been back since  PPM/ICD - Denies Device Orders - n/a Rep Notified - n/a  Chest x-ray - 12/29/2021 EKG - 12/29/2021 Stress Test - denies ECHO - 08/29/2017 Cardiac Cath - denies  Sleep Study - Denies CPAP - n/a  No DM  Blood Thinner Instructions: n/a Aspirin Instructions: n/a  ERAS Protcol - Yes. Clear liquids until 0430 morning of surgery PRE-SURGERY Ensure or G2- Ensure given to patient at PAT appointment  COVID TEST- n/a   Anesthesia review: No.   Patient denies shortness of breath, fever, cough and chest pain at PAT appointment   All instructions explained to the patient, with a verbal understanding of the material. Patient agrees to go over the instructions while at home for a better understanding. Patient also instructed to self quarantine after being tested for COVID-19. The opportunity to ask questions was provided.

## 2022-03-09 ENCOUNTER — Telehealth: Payer: Self-pay | Admitting: Family Medicine

## 2022-03-09 DIAGNOSIS — J4540 Moderate persistent asthma, uncomplicated: Secondary | ICD-10-CM

## 2022-03-09 MED ORDER — ALBUTEROL SULFATE HFA 108 (90 BASE) MCG/ACT IN AERS: 2.0000 | INHALATION_SPRAY | Freq: Four times a day (QID) | RESPIRATORY_TRACT | 0 refills | Status: DC | PRN

## 2022-03-09 NOTE — Addendum Note (Signed)
Addended by: Anderson Malta on: 03/09/2022 05:06 PM   Modules accepted: Orders

## 2022-03-09 NOTE — Telephone Encounter (Signed)
Requesting refill albuterol (VENTOLIN HFA) 108 (90 Base) MCG/ACT inhaler

## 2022-03-09 NOTE — Telephone Encounter (Signed)
Pt called to FU on medication refill.  Pt also wanted to make sure Rx was sent to: [id number] - [place], [place] [address] Phone:  [phone]  Fax:  [phone]     LOV:  12/29/21  Please advise.

## 2022-03-19 NOTE — Anesthesia Preprocedure Evaluation (Addendum)
Anesthesia Evaluation  Patient identified by MRN, date of birth, ID band Patient awake    Reviewed: Allergy & Precautions, H&P , NPO status , Patient's Chart, lab work & pertinent test results  History of Anesthesia Complications (+) Family history of anesthesia reaction  Airway Mallampati: II  TM Distance: >3 FB Neck ROM: Full    Dental no notable dental hx. (+) Teeth Intact, Dental Advisory Given   Pulmonary asthma ,    Pulmonary exam normal breath sounds clear to auscultation       Cardiovascular Exercise Tolerance: Good negative cardio ROS   Rhythm:Regular Rate:Normal     Neuro/Psych  Headaches, negative psych ROS   GI/Hepatic Neg liver ROS, GERD  ,  Endo/Other  negative endocrine ROS  Renal/GU negative Renal ROS  negative genitourinary   Musculoskeletal   Abdominal   Peds  Hematology negative hematology ROS (+)   Anesthesia Other Findings   Reproductive/Obstetrics negative OB ROS                            Anesthesia Physical Anesthesia Plan  ASA: 2  Anesthesia Plan: General   Post-op Pain Management: Tylenol PO (pre-op)*, Celebrex PO (pre-op)* and Regional block*   Induction: Intravenous  PONV Risk Score and Plan: 3 and Ondansetron, Dexamethasone and Midazolam  Airway Management Planned: Oral ETT  Additional Equipment:   Intra-op Plan:   Post-operative Plan: Extubation in OR  Informed Consent: I have reviewed the patients History and Physical, chart, labs and discussed the procedure including the risks, benefits and alternatives for the proposed anesthesia with the patient or authorized representative who has indicated his/her understanding and acceptance.     Dental advisory given  Plan Discussed with: CRNA  Anesthesia Plan Comments:        Anesthesia Quick Evaluation

## 2022-03-20 ENCOUNTER — Encounter (HOSPITAL_COMMUNITY): Payer: Self-pay | Admitting: Orthopaedic Surgery

## 2022-03-20 ENCOUNTER — Encounter (HOSPITAL_COMMUNITY)
Admission: RE | Disposition: A | Discharge status: Home / Self Care | Payer: Self-pay | Source: Home / Self Care | Attending: Orthopaedic Surgery

## 2022-03-20 ENCOUNTER — Other Ambulatory Visit: Payer: Self-pay

## 2022-03-20 ENCOUNTER — Other Ambulatory Visit (HOSPITAL_BASED_OUTPATIENT_CLINIC_OR_DEPARTMENT_OTHER): Payer: Self-pay

## 2022-03-20 ENCOUNTER — Ambulatory Visit (HOSPITAL_COMMUNITY): Payer: Commercial Managed Care - HMO

## 2022-03-20 ENCOUNTER — Ambulatory Visit (HOSPITAL_COMMUNITY)
Admission: RE | Admit: 2022-03-20 | Discharge: 2022-03-20 | Disposition: A | Discharge status: Home / Self Care | Payer: Commercial Managed Care - HMO | Attending: Orthopaedic Surgery | Admitting: Orthopaedic Surgery

## 2022-03-20 ENCOUNTER — Ambulatory Visit (HOSPITAL_COMMUNITY): Payer: Commercial Managed Care - HMO | Admitting: Physician Assistant

## 2022-03-20 ENCOUNTER — Ambulatory Visit (HOSPITAL_BASED_OUTPATIENT_CLINIC_OR_DEPARTMENT_OTHER): Payer: Commercial Managed Care - HMO | Admitting: Anesthesiology

## 2022-03-20 DIAGNOSIS — S73192A Other sprain of left hip, initial encounter: Secondary | ICD-10-CM | POA: Diagnosis not present

## 2022-03-20 DIAGNOSIS — J45909 Unspecified asthma, uncomplicated: Secondary | ICD-10-CM | POA: Insufficient documentation

## 2022-03-20 DIAGNOSIS — K219 Gastro-esophageal reflux disease without esophagitis: Secondary | ICD-10-CM | POA: Diagnosis not present

## 2022-03-20 DIAGNOSIS — X58XXXA Exposure to other specified factors, initial encounter: Secondary | ICD-10-CM | POA: Insufficient documentation

## 2022-03-20 HISTORY — PX: HIP ARTHROSCOPY: SHX668

## 2022-03-20 LAB — POCT PREGNANCY, URINE: Preg Test, Ur: NEGATIVE

## 2022-03-20 SURGERY — ARTHROSCOPY HIP
Anesthesia: Regional | Site: Hip | Laterality: Left

## 2022-03-20 MED ORDER — ASPIRIN 325 MG PO TBEC
325.0000 mg | DELAYED_RELEASE_TABLET | Freq: Every day | ORAL | 0 refills | Status: DC
  Filled 2022-03-20: qty 30, 30d supply, fill #0

## 2022-03-20 MED ORDER — BUPIVACAINE-EPINEPHRINE (PF) 0.5% -1:200000 IJ SOLN
INTRAMUSCULAR | Status: DC | PRN
  Administered 2022-03-20: 20 mL via PERINEURAL

## 2022-03-20 MED ORDER — CHLORHEXIDINE GLUCONATE 0.12 % MT SOLN
OROMUCOSAL | Status: AC
  Administered 2022-03-20: 15 mL via OROMUCOSAL
  Filled 2022-03-20: qty 15

## 2022-03-20 MED ORDER — TRANEXAMIC ACID-NACL 1000-0.7 MG/100ML-% IV SOLN
1000.0000 mg | INTRAVENOUS | Status: AC
  Administered 2022-03-20: 1000 mg via INTRAVENOUS

## 2022-03-20 MED ORDER — HYDROMORPHONE HCL 1 MG/ML IJ SOLN
INTRAMUSCULAR | Status: AC
  Filled 2022-03-20: qty 1

## 2022-03-20 MED ORDER — MIDAZOLAM HCL 2 MG/2ML IJ SOLN
INTRAMUSCULAR | Status: AC
  Filled 2022-03-20: qty 2

## 2022-03-20 MED ORDER — GABAPENTIN 300 MG PO CAPS: 300.0000 mg | ORAL_CAPSULE | Freq: Once | ORAL | Status: AC

## 2022-03-20 MED ORDER — PROPOFOL 10 MG/ML IV BOLUS
INTRAVENOUS | Status: DC | PRN
  Administered 2022-03-20: 150 mg via INTRAVENOUS

## 2022-03-20 MED ORDER — ACETAMINOPHEN 500 MG PO TABS: 1000.0000 mg | ORAL_TABLET | Freq: Once | ORAL | Status: AC

## 2022-03-20 MED ORDER — LACTATED RINGERS IV SOLN: INTRAVENOUS | Status: DC

## 2022-03-20 MED ORDER — EPINEPHRINE PF 1 MG/ML IJ SOLN
INTRAMUSCULAR | Status: DC | PRN
  Administered 2022-03-20 (×5): 1 mg

## 2022-03-20 MED ORDER — PROPOFOL 10 MG/ML IV BOLUS
INTRAVENOUS | Status: AC
  Filled 2022-03-20: qty 20

## 2022-03-20 MED ORDER — OXYCODONE HCL 5 MG/5ML PO SOLN
5.0000 mg | Freq: Once | ORAL | Status: AC
  Administered 2022-03-20: 5 mg via ORAL

## 2022-03-20 MED ORDER — GABAPENTIN 300 MG PO CAPS
ORAL_CAPSULE | ORAL | Status: AC
  Administered 2022-03-20: 300 mg via ORAL
  Filled 2022-03-20: qty 1

## 2022-03-20 MED ORDER — CELECOXIB 200 MG PO CAPS: 200.0000 mg | ORAL_CAPSULE | Freq: Once | ORAL | Status: AC

## 2022-03-20 MED ORDER — ACETAMINOPHEN 500 MG PO TABS
500.0000 mg | ORAL_TABLET | Freq: Three times a day (TID) | ORAL | 0 refills | Status: AC
  Filled 2022-03-20: qty 30, 10d supply, fill #0

## 2022-03-20 MED ORDER — CEFAZOLIN SODIUM-DEXTROSE 2-4 GM/100ML-% IV SOLN
2.0000 g | INTRAVENOUS | Status: AC
  Administered 2022-03-20: 2 g via INTRAVENOUS

## 2022-03-20 MED ORDER — HYDROMORPHONE HCL 1 MG/ML IJ SOLN
0.2500 mg | INTRAMUSCULAR | Status: DC | PRN
  Administered 2022-03-20 (×4): 0.5 mg via INTRAVENOUS

## 2022-03-20 MED ORDER — ONDANSETRON HCL 4 MG/2ML IJ SOLN
INTRAMUSCULAR | Status: DC | PRN
  Administered 2022-03-20: 4 mg via INTRAVENOUS

## 2022-03-20 MED ORDER — OXYCODONE HCL 5 MG PO TABS
5.0000 mg | ORAL_TABLET | ORAL | 0 refills | Status: DC | PRN
  Filled 2022-03-20: qty 20, 4d supply, fill #0

## 2022-03-20 MED ORDER — MIDAZOLAM HCL 2 MG/2ML IJ SOLN
INTRAMUSCULAR | Status: DC | PRN
  Administered 2022-03-20: 2 mg via INTRAVENOUS

## 2022-03-20 MED ORDER — CHLORHEXIDINE GLUCONATE 0.12 % MT SOLN: 15.0000 mL | Freq: Once | OROMUCOSAL | Status: AC

## 2022-03-20 MED ORDER — LIDOCAINE 2% (20 MG/ML) 5 ML SYRINGE
INTRAMUSCULAR | Status: AC
  Filled 2022-03-20: qty 5

## 2022-03-20 MED ORDER — PHENYLEPHRINE 80 MCG/ML (10ML) SYRINGE FOR IV PUSH (FOR BLOOD PRESSURE SUPPORT)
PREFILLED_SYRINGE | INTRAVENOUS | Status: DC | PRN
  Administered 2022-03-20 (×8): 80 ug via INTRAVENOUS

## 2022-03-20 MED ORDER — CELECOXIB 200 MG PO CAPS
ORAL_CAPSULE | ORAL | Status: AC
  Administered 2022-03-20: 200 mg via ORAL
  Filled 2022-03-20: qty 1

## 2022-03-20 MED ORDER — FENTANYL CITRATE (PF) 250 MCG/5ML IJ SOLN
INTRAMUSCULAR | Status: DC | PRN
  Administered 2022-03-20: 100 ug via INTRAVENOUS
  Administered 2022-03-20: 50 ug via INTRAVENOUS

## 2022-03-20 MED ORDER — 0.9 % SODIUM CHLORIDE (POUR BTL) OPTIME
TOPICAL | Status: DC | PRN
  Administered 2022-03-20: 1000 mL

## 2022-03-20 MED ORDER — DEXAMETHASONE SODIUM PHOSPHATE 10 MG/ML IJ SOLN
INTRAMUSCULAR | Status: DC | PRN
  Administered 2022-03-20: 10 mg via INTRAVENOUS

## 2022-03-20 MED ORDER — EPINEPHRINE PF 1 MG/ML IJ SOLN
INTRAMUSCULAR | Status: AC
  Filled 2022-03-20: qty 8

## 2022-03-20 MED ORDER — OXYCODONE HCL 5 MG PO TABS: 5.0000 mg | ORAL_TABLET | Freq: Once | ORAL | Status: AC

## 2022-03-20 MED ORDER — SUGAMMADEX SODIUM 200 MG/2ML IV SOLN
INTRAVENOUS | Status: DC | PRN
  Administered 2022-03-20: 200 mg via INTRAVENOUS

## 2022-03-20 MED ORDER — ORAL CARE MOUTH RINSE: 15.0000 mL | Freq: Once | OROMUCOSAL | Status: AC

## 2022-03-20 MED ORDER — IBUPROFEN 800 MG PO TABS
800.0000 mg | ORAL_TABLET | Freq: Three times a day (TID) | ORAL | 0 refills | Status: AC
  Filled 2022-03-20: qty 30, 10d supply, fill #0

## 2022-03-20 MED ORDER — PHENYLEPHRINE 80 MCG/ML (10ML) SYRINGE FOR IV PUSH (FOR BLOOD PRESSURE SUPPORT)
PREFILLED_SYRINGE | INTRAVENOUS | Status: AC
  Filled 2022-03-20: qty 10

## 2022-03-20 MED ORDER — SODIUM CHLORIDE 0.9 % IR SOLN
Status: DC | PRN
  Administered 2022-03-20 (×5): 3000 mL

## 2022-03-20 MED ORDER — ROCURONIUM BROMIDE 10 MG/ML (PF) SYRINGE
PREFILLED_SYRINGE | INTRAVENOUS | Status: DC | PRN
  Administered 2022-03-20 (×2): 10 mg via INTRAVENOUS
  Administered 2022-03-20: 60 mg via INTRAVENOUS

## 2022-03-20 MED ORDER — DEXAMETHASONE SODIUM PHOSPHATE 10 MG/ML IJ SOLN
INTRAMUSCULAR | Status: AC
  Filled 2022-03-20: qty 1

## 2022-03-20 MED ORDER — ACETAMINOPHEN 500 MG PO TABS
ORAL_TABLET | ORAL | Status: AC
  Administered 2022-03-20: 1000 mg via ORAL
  Filled 2022-03-20: qty 2

## 2022-03-20 MED ORDER — OXYCODONE HCL 5 MG/5ML PO SOLN
ORAL | Status: AC
  Filled 2022-03-20: qty 5

## 2022-03-20 MED ORDER — FENTANYL CITRATE (PF) 250 MCG/5ML IJ SOLN
INTRAMUSCULAR | Status: AC
  Filled 2022-03-20: qty 5

## 2022-03-20 MED ORDER — TRANEXAMIC ACID-NACL 1000-0.7 MG/100ML-% IV SOLN
INTRAVENOUS | Status: AC
  Filled 2022-03-20: qty 100

## 2022-03-20 MED ORDER — ONDANSETRON HCL 4 MG/2ML IJ SOLN
INTRAMUSCULAR | Status: AC
  Filled 2022-03-20: qty 2

## 2022-03-20 MED ORDER — LIDOCAINE 2% (20 MG/ML) 5 ML SYRINGE
INTRAMUSCULAR | Status: DC | PRN
  Administered 2022-03-20: 60 mg via INTRAVENOUS

## 2022-03-20 MED ORDER — CEFAZOLIN SODIUM-DEXTROSE 2-4 GM/100ML-% IV SOLN
INTRAVENOUS | Status: AC
  Filled 2022-03-20: qty 100

## 2022-03-20 MED ORDER — ROCURONIUM BROMIDE 10 MG/ML (PF) SYRINGE
PREFILLED_SYRINGE | INTRAVENOUS | Status: AC
  Filled 2022-03-20: qty 10

## 2022-03-20 SURGICAL SUPPLY — 73 items
ANCH SUT .5 CRC TPR CT 40X40 (SUTURE) ×1
ANCH SUT 2.4 KNTLS INSRTR LCK (Anchor) ×1 IMPLANT
ANCH SUT 25D 1.4 FLXINSRTR (Anchor) ×1 IMPLANT
ANCHOR SUT 1.4 FLEX (Anchor) IMPLANT
ANCHOR SUT 2.4 SS KNTLS #1 (Anchor) IMPLANT
APL PRP STRL LF DISP 70% ISPRP (MISCELLANEOUS) ×1
BAG COUNTER SPONGE SURGICOUNT (BAG) IMPLANT
BAG SPNG CNTER NS LX DISP (BAG)
BIT DRILL FLEX NANOTACK (BIT) IMPLANT
BIT DRILL SS CINCHLOCK (BIT) IMPLANT
BLADE SAMURAI STR FULL RADIUS (BLADE) IMPLANT
BLADE SURG 11 STRL SS (BLADE) ×1 IMPLANT
BUR ROUND HI FLUTE 8 4X19 (BURR) ×1 IMPLANT
BURR ROUND HI FLUTE 8 4X19 (BURR) ×1
CANNULA OBTURATOR FLOWPORT ST5 (CANNULA) IMPLANT
CHLORAPREP W/TINT 26 (MISCELLANEOUS) ×1 IMPLANT
COVER SURGICAL LIGHT HANDLE (MISCELLANEOUS) IMPLANT
DISSECTOR 4.2MMX19CM HL (MISCELLANEOUS) ×1 IMPLANT
DRAPE C-ARM 42X72 X-RAY (DRAPES) ×1 IMPLANT
DRAPE STERI IOBAN 125X83 (DRAPES) ×1 IMPLANT
DRAPE U-SHAPE 47X51 STRL (DRAPES) ×2 IMPLANT
DRSG TEGADERM 2-3/8X2-3/4 SM (GAUZE/BANDAGES/DRESSINGS) ×3 IMPLANT
DRSG TEGADERM 4X10 (GAUZE/BANDAGES/DRESSINGS) ×1 IMPLANT
DRSG TEGADERM 4X4.75 (GAUZE/BANDAGES/DRESSINGS) ×2 IMPLANT
FEE RENTAL EQUIP HIP INSTR KIT (INSTRUMENTS) IMPLANT
GAUZE SPONGE 4X4 12PLY STRL (GAUZE/BANDAGES/DRESSINGS) ×1 IMPLANT
GAUZE XEROFORM 1X8 LF (GAUZE/BANDAGES/DRESSINGS) ×1 IMPLANT
GLOVE BIOGEL PI IND STRL 6.5 (GLOVE) ×1 IMPLANT
GLOVE BIOGEL PI IND STRL 8 (GLOVE) ×1 IMPLANT
GLOVE ECLIPSE 6.0 STRL STRAW (GLOVE) ×1 IMPLANT
GLOVE ECLIPSE 7.5 STRL STRAW (GLOVE) IMPLANT
GLOVE ECLIPSE 8.0 STRL XLNG CF (GLOVE) ×1 IMPLANT
GOWN STRL REUS W/ TWL LRG LVL3 (GOWN DISPOSABLE) ×2 IMPLANT
GOWN STRL REUS W/TWL LRG LVL3 (GOWN DISPOSABLE) ×2
INSTRUMENT ORTHO TEXT HIP FEM (INSTRUMENTS) IMPLANT
KIT BASIN OR (CUSTOM PROCEDURE TRAY) ×1 IMPLANT
KIT HIP ARTHROSCOPY (ORTHOPEDIC DISPOSABLE SUPPLIES) ×1 IMPLANT
KIT PATIENT POSITION LRG (KITS) IMPLANT
KIT PORTAL ENTRY HIP ACCESS (KITS) IMPLANT
KIT TURNOVER KIT B (KITS) ×1 IMPLANT
MANIFOLD NEPTUNE II (INSTRUMENTS) ×2 IMPLANT
MARKER SKIN DUAL TIP RULER LAB (MISCELLANEOUS) ×1 IMPLANT
NDL INJECTOR II CARTRIDGE (MISCELLANEOUS) IMPLANT
NDL SPNL 18GX3.5 QUINCKE PK (NEEDLE) ×1 IMPLANT
NEEDLE INJECTOR II CARTRIDGE (MISCELLANEOUS) ×1 IMPLANT
NEEDLE SPNL 18GX3.5 QUINCKE PK (NEEDLE) ×1 IMPLANT
PACK BASIC III (CUSTOM PROCEDURE TRAY) ×1
PACK SRG BSC III STRL LF ECLPS (CUSTOM PROCEDURE TRAY) ×1 IMPLANT
PAD ARMBOARD 7.5X6 YLW CONV (MISCELLANEOUS) ×2 IMPLANT
PASSER SUT 1.5D CRESCENT (INSTRUMENTS) IMPLANT
PASSER SUT 70D UP ANGLED (INSTRUMENTS) IMPLANT
SPIKE FLUID TRANSFER (MISCELLANEOUS) ×1 IMPLANT
SPONGE T-LAP 18X18 ~~LOC~~+RFID (SPONGE) ×1 IMPLANT
SUT ETHIBOND 3 0 SH 1 (SUTURE) ×1 IMPLANT
SUT ETHILON 3 0 PS 1 (SUTURE) IMPLANT
SUT ETHILON 4 0 PS 2 18 (SUTURE) ×1 IMPLANT
SUT FIBERWIRE #2 38 T-5 BLUE (SUTURE)
SUT XBRAID 1.4 BLUE (SUTURE) IMPLANT
SUT ZIPLINE SZ2 BLK (SUTURE) IMPLANT
SUT ZIPLINE SZ2 GREEN (SUTURE) IMPLANT
SUTURE FIBERWR #2 38 T-5 BLUE (SUTURE) IMPLANT
SUTURE TAPE XBRAID 1.2 BLUE 45 (SUTURE) IMPLANT
SUTURETAPE XBRAID 1.2 BLUE 45 (SUTURE) ×2
SYR 10ML LL (SYRINGE) IMPLANT
SYR 50ML LL SCALE MARK (SYRINGE) ×1 IMPLANT
TAPE CLOTH 4X10 WHT NS (GAUZE/BANDAGES/DRESSINGS) ×1 IMPLANT
TOWEL GREEN STERILE (TOWEL DISPOSABLE) ×1 IMPLANT
TRAY ARTHROSCOPY HIP STRE FEE (INSTRUMENTS) ×1 IMPLANT
TRAY PIVOT PORT STRE FEE (INSTRUMENTS) ×1 IMPLANT
TUBE CONNECTING 12X1/4 (SUCTIONS) ×2 IMPLANT
TUBING ARTHROSCOPY IRRIG 16FT (MISCELLANEOUS) ×1 IMPLANT
WAND APOLLO RF 50D ABLATOR (BUR) IMPLANT
WATER STERILE IRR 1000ML POUR (IV SOLUTION) ×1 IMPLANT

## 2022-03-20 NOTE — H&P (Signed)
Chief Complaint: Left hip pain        History of Present Illness:      Maria Smith Seen is a 26 y.o. female presents today with ongoing left hip pain that has been going on for several years.  This has been very disabling and most activities.  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  She has pain in most activities of daily living.  She is very concerned about potentially caring for her child in the future as well.  She states that she feels popping and clicking of the left hip and the hip often times feels like it is going to give out.  He describes the pain as a C-shaped distribution around the hip.  She has previously had 2 months of physical therapy with strengthening of the left hip which did not help significantly.  He has never had any injections.  She has trialed anti-inflammatory medications as well without any significant long-lasting relief.  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    Diffuse enlargement on [place] 6/20         Past Surgical History:  Procedure Laterality Date   TONSILLECTOMY   2012   WISDOM TOOTH EXTRACTION        Social History         Socioeconomic History   Marital status: Married      Spouse name: Not on file   Number of  children: 2   Years of education: Not on file   Highest education level: Bachelor's degree (e.g., BA, AB, BS)  Occupational History   Occupation: dietary  Tobacco Use   Smoking status: Never   Smokeless tobacco: Never  Vaping Use   Vaping Use: Never used  Substance and Sexual Activity   Alcohol use: Not Currently   Drug use: No   Sexual activity: Yes      Partners: Female      Comment: female partner  Other Topics Concern   Not on file  Social History Narrative    Patient works at [employer]    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     Social Determinants of Health        Financial Resource Strain: Low Risk  (08/29/2021)    Overall Financial Resource Strain (CARDIA)  Difficulty of Paying Living Expenses: Not hard at all  Food Insecurity: No Food Insecurity (08/29/2021)    Hunger Vital Sign     Worried About Running Out of Food in the Last Year: Never true     Ran Out of Food in the Last Year: Never true  Transportation Needs: No Transportation Needs (08/29/2021)    PRAPARE - Armed forces logistics/support/administrative officer (Medical): No     Lack of Transportation (Non-Medical): No  Physical Activity: Insufficiently Active (08/29/2021)    Exercise Vital Sign     Days of Exercise per Week: 2 days     Minutes of Exercise per Session: 30 min  Stress: No Stress Concern Present (08/29/2021)    [place]     Feeling of Stress : Not at all  Social Connections: Unknown (08/29/2021)    Social Connection and Isolation Panel [NHANES]     Frequency of Communication with Friends and Family: More than three times a week     Frequency of Social Gatherings with Friends and Family: Patient refused     Attends Religious Services: Patient refused     Marine scientist or Organizations: No     Attends Arts administrator: Not on file     Marital Status: Married         Family History  Problem Relation Age of Onset   Migraines Paternal Grandmother     Migraines Paternal Grandfather     HIV Father     Diabetes Mother     Hypertension Mother     Anemia Mother     COPD Mother     CVA Mother     Congestive Heart Failure Mother     Cirrhosis Mother     Obesity Mother     Irritable bowel syndrome Mother     Rheum arthritis Mother     Ulcers Brother     Anemia Sister     Cholecystitis Brother     Migraines Paternal Aunt     Migraines Maternal Uncle          Maternal Great Uncle   Diabetes Other          Family History   Hypertension Other          Family History   Depression Other          Family History   Asthma Other          Family History   Allergic rhinitis Other          Family History   Asthma Brother     Colon cancer Neg Hx     Colon polyps Neg Hx     Esophageal cancer Neg Hx     Rectal cancer Neg Hx     Stomach cancer Neg Hx      No Known Allergies       Current Outpatient Medications  Medication Sig Dispense Refill   albuterol (PROVENTIL) (2.5 MG/3ML) 0.083% nebulizer solution Take 3 mLs (2.5 mg total) by nebulization every 6 (six) hours as needed for wheezing or shortness of breath. 150 mL 1   albuterol (VENTOLIN HFA) 108 (90 Base) MCG/ACT inhaler Inhale 2 puffs into the lungs every 6 (six) hours as needed for wheezing or shortness of breath. 8 g 0   Ascorbic Acid (VITAMIN C) 1000 MG tablet Take 1,000 mg by mouth daily.  cetirizine (ZYRTEC) 10 MG tablet Take 1 tablet (10 mg total) by mouth daily. 90 tablet 3   fluticasone (FLONASE) 50 MCG/ACT nasal spray Place 2 sprays into both nostrils daily. 16 g 0   fluticasone (FLOVENT HFA) 44 MCG/ACT inhaler Inhale 2 puffs into the lungs in the morning and at bedtime. 1 each 0   meloxicam (MOBIC) 15 MG tablet Take 1 tablet (15 mg total) by mouth daily. 30 tablet 0   Multiple Vitamin (MULTIVITAMIN) tablet Take 1 tablet by  mouth daily.       Olopatadine HCl 0.2 % SOLN INSTILL 1 DROP INTO AFFECTED EYE EVERY DAY 7.5 mL 1   omeprazole (PRILOSEC) 40 MG capsule Take 1 capsule (40 mg total) by mouth 2 (two) times daily. 60 capsule 3   Respiratory Therapy Supplies (NEBULIZER/TUBING/MOUTHPIECE) KIT Use as directed. 1 kit 3    No current facility-administered medications for this visit.    Imaging Results (Last 48 hours)  No results found.     Review of Systems:   A ROS was performed including pertinent positives and negatives as documented in the HPI.   Physical Exam :   Constitutional: NAD and appears stated age Neurological: Alert and oriented Psych: Appropriate affect and cooperative Last menstrual period 11/19/2021.    Comprehensive Musculoskeletal Exam:     Inspection Right Left  Skin No atrophy or gross abnormalities appreciated No atrophy or gross abnormalities appreciated  Palpation      Tenderness None Femoral acetabular  Crepitus None None  Range of Motion      Flexion (passive) 120 120  Extension 30 30  IR 30 30 with pain  ER 45 45  Strength      Flexion  5/5 5/5  Extension 5/5 5/5  Special Tests      FABER Negative Negative  FADIR Negative Painful  ER Lag/Capsular Insufficiency Negative Negative  Instability Negative Negative  Sacroiliac pain Negative  Negative   Instability      Generalized Laxity No Yes  Neurologic      sciatic, femoral, obturator nerves intact to light sensation  Vascular/Lymphatic      DP pulse 2+ 2+  Lumbar Exam      Patient has symmetric lumbar range of motion with negative pain referral to hip        Imaging:   Xray (pelvis 2 views left hip): Normal with lateral center edge angle of 30 degrees   MRI left hip: There is evidence of labral tearing of the anterior-inferior labrum   I personally reviewed and interpreted the radiographs.     Assessment:   26 y.o. 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  I described the labrum's role in providing a good suction fit of the hip.  At this time she remains quite disabled from the hip and most activities of daily living she has significant pain.  She is not able to carry or play with her 73-year-old twins as result of the hip.  Given the fact that she has trialed physical therapy I did discuss neck step.  At today's visit I recommend an ultrasound-guided injection of lidocaine of the left hip so that we can surely rule in her hip as the primary pain generator.  Left hip injection was performed under ultrasound guidance with lidocaine only.  She did overall get good relief and as result we did have a surgical discussion.  I did discuss  the role for arthroscopic labral repair and the specific complications and rehab that comes with this.  She understands that she would be in a brace postoperatively with a period of limited weightbearing.  After further discussion she would like to move towards this pending discussion with her work.   Plan :     -Plan for left hip arthroscopy with labral repair     After a lengthy discussion of treatment options, including risks, benefits, alternatives, complications of surgical and nonsurgical conservative options, the patient elected surgical repair.    The patient  is aware of the material risks  and complications including, but not limited to injury to adjacent structures, neurovascular injury, infection, numbness, bleeding, implant failure, thermal burns, stiffness, persistent pain, failure to heal, disease transmission from allograft, need for further surgery, dislocation, anesthetic risks, blood clots, risks of death,and others. The probabilities of surgical success and failure discussed with patient given their particular co-morbidities.The time and nature of expected rehabilitation and recovery was discussed.The patient's questions were all answered preoperatively.  No barriers to understanding were  noted. I explained the natural history of the disease process and Rx rationale.  I explained to the patient what I considered to be reasonable expectations given their personal situation.  The final treatment plan was arrived at through a shared patient decision making process model.           I personally saw and evaluated the patient, and participated in the management and treatment plan.   Vanetta Mulders, MD Attending Physician, Orthopedic Surgery   This document was dictated using Dragon voice recognition software. A reasonable attempt at proof reading has been made to minimize errors.

## 2022-03-20 NOTE — Interval H&P Note (Signed)
History and Physical Interval Note:  03/20/2022 7:01 AM  Maria Smith  has presented today for surgery, with the diagnosis of LEFT FEMORACETABULAR LABRAL TEAR.  The various methods of treatment have been discussed with the patient and family. After consideration of risks, benefits and other options for treatment, the patient has consented to  Procedure(s): LEFT HIP ARTHROSCOPY WITH LABRAL REPAIR (Left) as a surgical intervention.  The patient's history has been reviewed, patient examined, no change in status, stable for surgery.  I have reviewed the patient's chart and labs.  Questions were answered to the patient's satisfaction.     Vanetta Mulders

## 2022-03-20 NOTE — Anesthesia Procedure Notes (Signed)
Procedure Name: Intubation Date/Time: 03/20/2022 7:41 AM  Performed by: Michele Rockers, CRNAPre-anesthesia Checklist: Patient identified, Patient being monitored, Timeout performed, Emergency Drugs available and Suction available Patient Re-evaluated:Patient Re-evaluated prior to induction Oxygen Delivery Method: Circle System Utilized Preoxygenation: Pre-oxygenation with 100% oxygen Induction Type: IV induction Ventilation: Mask ventilation without difficulty Laryngoscope Size: Miller and 2 Grade View: Grade I Tube type: Oral Tube size: 7.0 mm Number of attempts: 1 Airway Equipment and Method: Stylet Placement Confirmation: ETT inserted through vocal cords under direct vision, positive ETCO2 and breath sounds checked- equal and bilateral Secured at: 21 cm Tube secured with: Tape Dental Injury: Teeth and Oropharynx as per pre-operative assessment

## 2022-03-20 NOTE — Transfer of Care (Signed)
Immediate Anesthesia Transfer of Care Note  Patient: Maria Smith  Procedure(s) Performed: LEFT HIP ARTHROSCOPY WITH LABRAL REPAIR (Left: Hip)  Patient Location: PACU  Anesthesia Type:GA combined with regional for post-op pain  Level of Consciousness: drowsy, patient cooperative and responds to stimulation  Airway & Oxygen Therapy: Patient Spontanous Breathing and Patient connected to nasal cannula oxygen  Post-op Assessment: Report given to RN and Post -op Vital signs reviewed and stable  Post vital signs: Reviewed and stable  Last Vitals:  Vitals Value Taken Time  BP 103/61 03/20/22 1005  Temp 36.9 C 03/20/22 1005  Pulse 70 03/20/22 1006  Resp 20 03/20/22 1007  SpO2 100 % 03/20/22 1006  Vitals shown include unvalidated device data.  Last Pain:  Vitals:   03/20/22 1005  TempSrc:   PainSc: Asleep      Patients Stated Pain Goal: 0 (22/02/54 2706)  Complications: No notable events documented.

## 2022-03-20 NOTE — Anesthesia Procedure Notes (Signed)
Anesthesia Regional Block: Peng block   Pre-Anesthetic Checklist: , timeout performed,  Correct Patient, Correct Site, Correct Laterality,  Correct Procedure, Correct Position, site marked,  Risks and benefits discussed,  Pre-op evaluation,  At surgeon's request and post-op pain management  Laterality: Left  Prep: Maximum Sterile Barrier Precautions used, chloraprep       Needles:  Injection technique: Single-shot  Needle Type: Echogenic Stimulator Needle     Needle Length: 9cm  Needle Gauge: 21     Additional Needles:   Procedures:,,,, ultrasound used (permanent image in chart),,    Narrative:  Start time: 03/20/2022 7:10 AM End time: 03/20/2022 7:20 AM Injection made incrementally with aspirations every 5 mL.  Performed by: Personally  Anesthesiologist: Roderic Palau, MD

## 2022-03-20 NOTE — Addendum Note (Signed)
Addendum  created 03/20/22 1323 by Michele Rockers, CRNA   Flowsheet accepted

## 2022-03-20 NOTE — Op Note (Signed)
Date of Surgery: 03/20/2022  INDICATIONS: [name] is a 26 y.o.-year-old female with a symptomatic labral tear which is failed conservative management.  The risk and benefits of the procedure were discussed in detail and documented in the pre-operative evaluation.   PREOPERATIVE DIAGNOSIS: 1.  Left hip labral tear  POSTOPERATIVE DIAGNOSIS: Same.  PROCEDURE: 1.  Left hip labral repair 2.  Left hip pincer debridement  SURGEON: Benancio Deeds MD  ASSISTANT: Kerby Less, ATC  ANESTHESIA:  general  IV FLUIDS AND URINE: See anesthesia record.  ANTIBIOTICS: Ancef  ESTIMATED BLOOD LOSS: 10 mL.  IMPLANTS:  Implant Name Type Inv. Item Serial No. Manufacturer Lot No. LRB No. Used Action  ANCHOR SUT 2.4 SS KNTLS #1 - [id number] Anchor ANCHOR SUT 2.4 SS KNTLS #1  [employer] [id number] Left 1 Implanted  ANCHOR SUT 1.4 FLEX - [id number] Anchor ANCHOR SUT 1.4 FLEX  [employer] [id number] Left 1 Implanted    DRAINS: None  CULTURES: None  COMPLICATIONS: none  DESCRIPTION OF PROCEDURE:  Cartilage Normal cartilage involving femoral head and acetabulum  Labrum There was a hypoplastic labrum with tearing and invagination from the 1:00 to 3 o'clock position.  There was redness and injection of the labrum particularly at the 1 o'clock position   OPERATIVE REPORT:  The patient was brought to the operating room, placed supine on the operating table, and bony prominences were padded.  The traction boots were applied with padding to ensure that safe traction could be applied through the feet.  The contralateral limb was abducted maximally and light traction was applied.  The operative leg was brought into neutral position.  The flouroscopic c-arm was brought between the legs for an AP image.  The patient was prepped and draped in a sterile fashion.  Time-out was performed and landmarks were identified. Traction was obtained and care was taken to ensure the least amount of force  necessary to allow safe access to the joint of 8-42mm.  This was checked with fluoroscopy.   Next we placed an anterolateral portal under the assistance of fluoroscopy.  First, fluoroscopy was used to estimate the trajectory and starting point.  A 71mm incision with a #11 blade was made and a straight hemostat was used to dilate the portal through the appropriate tract.  We then placed a 14-gauge hypodermic needle with careful technique to be as close to the femoral head as possible and parallel to the sorcele to ensure no iatrogenic damage to the labrum.  This released the negative pressure environment and the amount of traction was adjusted to maintain the 8-52mm of distraction.  A nitinol wire was placed through the needle and flouroscopy was used to ensure it extended to the medial wall of the acetabulum.  The Flowport from [employer] was placed over the wire and the nitinol wire was retracted to just inside the capsule during insertion of the dilator and cannula to minimize the risk of breakage. The arthroscope was placed next and we visualized the anterior triangle.    We then placed the anterior portal under direct visualization using the technique described above this was a modified mid anterior portal placed more laterally.  This was safely placed as well without damage to the labrum or femoral head.  We then switched our arthroscope to the anterior portal to ensure we were not through the labrum - we were safely through the capsule only.  We then proceeded with a transverse capsulotomy connecting the 2 portals in  the same plane utilizing the Samurai blade from [place].  We then proceeded with a diagnostic arthroscopy - the results can be found in the findings section above.   We then used the 50 degree hip specific radiofrequency device from [employer]. and a 21mm shaver to clear the superior acetabulum and expose the subspinous region.  Next we exposed the acetabular rim leaving  the chondral labral junction intact.  [place] was initially introduced and then placed on forward mode so as to debride the acetabular bed for healthy healing surface.   When adequate reshaping of the acetabulum was obtained we then proceeded with the labral repair.  A distal anterolateral portal was placed under direct visualization and the Transport cannula was inserted.  Care was taken to ensure the cannula was in the intermuscular plane between the gluteus minimus and iliocapsularis.  This portal was approximately 4cm distal and 1cm anterior to the anterolateral portal.      We placed 2 anchors at the 3 o'clock and 1 o'clock positions.  The sutures were passed using the crescent Nanopass from [place].  This resulted in anatomic labral repair.  We debrided the loose cartilage at the rim and residual degenerative labral tissue.  Traction was let down with total traction time of 71 minutes.  There is excellent suction seal of the hip following this which was tested with traction.   We then removed the arthroscope and closed the incisions with 3-0 nylon simple stitches.  A sterile dressing was applied..  The patient was awakened from anesthesia and transferred to PACU in stable condition.   POSTOPERATIVE PLAN: She will be touchdown weightbearing for a total of 2 weeks.  We will plan to progress her after that.  I will see her back in clinic in 2 weeks for postop check and suture removal.  She will begin physical therapy postop immediately she will be in a brace for 1 month.  Yevonne Pax, MD 9:40 AM

## 2022-03-20 NOTE — Brief Op Note (Signed)
   Brief Op Note  Date of Surgery: 03/20/2022  Preoperative Diagnosis: LEFT FEMORACETABULAR LABRAL TEAR  Postoperative Diagnosis: same  Procedure: Procedure(s): LEFT HIP ARTHROSCOPY WITH LABRAL REPAIR  Implants: Implant Name Type Inv. Item Serial No. Manufacturer Lot No. LRB No. Used Action  ANCHOR SUT 2.4 SS KNTLS #1 - [id number] Anchor ANCHOR SUT 2.4 SS KNTLS #1  [employer] [id number] Left 1 Implanted  ANCHOR SUT 1.4 FLEX - [id number] Anchor ANCHOR SUT 1.4 FLEX  [employer]COPY [id number] Left 1 Implanted    Surgeons: Surgeon(s): Vanetta Mulders, MD  Anesthesia: Regional    Estimated Blood Loss: See anesthesia record  Complications: None  Condition to PACU: Stable  Yevonne Pax, MD 03/20/2022 9:39 AM

## 2022-03-20 NOTE — Anesthesia Postprocedure Evaluation (Signed)
Anesthesia Post Note  Patient: Maria Smith  Procedure(s) Performed: LEFT HIP ARTHROSCOPY WITH LABRAL REPAIR (Left: Hip)     Patient location during evaluation: PACU Anesthesia Type: General and Regional Level of consciousness: awake and alert Pain management: pain level controlled Vital Signs Assessment: post-procedure vital signs reviewed and stable Respiratory status: spontaneous breathing, nonlabored ventilation and respiratory function stable Cardiovascular status: blood pressure returned to baseline and stable Postop Assessment: no apparent nausea or vomiting Anesthetic complications: no   No notable events documented.  Last Vitals:  Vitals:   03/20/22 1035 03/20/22 1050  BP: 111/69 115/64  Pulse: 76 65  Resp: 17 12  Temp:  36.9 C  SpO2: 99% 99%    Last Pain:  Vitals:   03/20/22 1050  TempSrc:   PainSc: Asleep                 Orpha Dain,W. EDMOND

## 2022-03-20 NOTE — Discharge Instructions (Signed)
     Discharge Instructions    Attending Surgeon: Vanetta Mulders, MD Office Phone Number: [phone]   Diagnosis and Procedures:    Surgeries Performed: Left hip labral repair  Discharge Plan:    Diet: Resume usual diet. Begin with light or bland foods.  Drink plenty of fluids.  Activity:  Touchdown weight bearing left leg. You are advised to go home directly from the hospital or surgical center. Restrict your activities.  GENERAL INSTRUCTIONS: 1.  Please apply ice to your wound to help with swelling and inflammation. This will improve your comfort and your overall recovery following surgery.     2. Please call Dr. Eddie Dibbles office at [phone] with questions Monday-Friday during business hours. If no one answers, please leave a message and someone should get back to the patient within 24 hours. For emergencies please call 911 or proceed to the emergency room.   3. Patient to notify surgical team if experiences any of the following: Bowel/Bladder dysfunction, uncontrolled pain, nerve/muscle weakness, incision with increased drainage or redness, nausea/vomiting and Fever greater than 101.0 F.  Be alert for signs of infection including redness, streaking, odor, fever or chills. Be alert for excessive pain or bleeding and notify your surgeon immediately.  WOUND INSTRUCTIONS:   Leave your dressing, cast, or splint in place until your post operative visit.  Keep it clean and dry.  Always keep the incision clean and dry until the staples/sutures are removed. If there is no drainage from the incision you should keep it open to air. If there is drainage from the incision you must keep it covered at all times until the drainage stops  Do not soak in a bath tub, hot tub, pool, lake or other body of water until 21 days after your surgery and your incision is completely dry and healed.  If you have removable sutures (or staples) they must be removed 10-14 days (unless otherwise  instructed) from the day of your surgery.     1)  Elevate the extremity as much as possible.  2)  Keep the dressing clean and dry.  3)  Please call us if the dressing becomes wet or dirty.  4)  If you are experiencing worsening pain or worsening swelling, please call.     MEDICATIONS: Resume all previous home medications at the previous prescribed dose and frequency unless otherwise noted Start taking the  pain medications on an as-needed basis as prescribed  Please taper down pain medication over the next week following surgery.  Ideally you should not require a refill of any narcotic pain medication.  Take pain medication with food to minimize nausea. In addition to the prescribed pain medication, you may take over-the-counter pain relievers such as Tylenol.  Do NOT take additional tylenol if your pain medication already has tylenol in it.  Aspirin 325mg  daily for four weeks.      FOLLOWUP INSTRUCTIONS: 1. Follow up at the Physical Therapy Clinic 3-4 days following surgery. This appointment should be scheduled unless other arrangements have been made.The Physical Therapy scheduling number is [phone] if an appointment has not already been arranged.  2. Contact Dr. Eddie Dibbles office during office hours at [phone] or the practice after hours line at [phone] for non-emergencies. For medical emergencies call 911.   Discharge Location: Home

## 2022-03-20 NOTE — Progress Notes (Signed)
Orthopedic Tech Progress Note Patient Details:  Maria Smith 15-Jun-1996 [id number]  Ortho Devices Type of Ortho Device: Crutches Ortho Device/Splint Interventions: Ordered, Adjustment   Post Interventions Patient Tolerated: Well  Maria Smith 03/20/2022, 11:23 AM

## 2022-03-21 ENCOUNTER — Encounter (HOSPITAL_BASED_OUTPATIENT_CLINIC_OR_DEPARTMENT_OTHER): Payer: Self-pay | Admitting: Orthopaedic Surgery

## 2022-03-21 ENCOUNTER — Telehealth (HOSPITAL_BASED_OUTPATIENT_CLINIC_OR_DEPARTMENT_OTHER): Payer: Self-pay | Admitting: Orthopaedic Surgery

## 2022-03-21 NOTE — Telephone Encounter (Signed)
Post op question 

## 2022-03-22 ENCOUNTER — Other Ambulatory Visit (HOSPITAL_BASED_OUTPATIENT_CLINIC_OR_DEPARTMENT_OTHER): Payer: Self-pay | Admitting: Orthopaedic Surgery

## 2022-03-22 MED ORDER — HYDROCODONE-ACETAMINOPHEN 5-325 MG PO TABS: 1.0000 | ORAL_TABLET | Freq: Four times a day (QID) | ORAL | 0 refills | Status: DC | PRN

## 2022-03-23 ENCOUNTER — Ambulatory Visit (HOSPITAL_BASED_OUTPATIENT_CLINIC_OR_DEPARTMENT_OTHER): Payer: Commercial Managed Care - HMO | Attending: Orthopaedic Surgery | Admitting: Physical Therapy

## 2022-03-23 ENCOUNTER — Other Ambulatory Visit (HOSPITAL_BASED_OUTPATIENT_CLINIC_OR_DEPARTMENT_OTHER): Payer: Self-pay

## 2022-03-23 ENCOUNTER — Other Ambulatory Visit: Payer: Self-pay

## 2022-03-23 ENCOUNTER — Encounter (HOSPITAL_BASED_OUTPATIENT_CLINIC_OR_DEPARTMENT_OTHER): Payer: Self-pay | Admitting: Physical Therapy

## 2022-03-23 DIAGNOSIS — Y929 Unspecified place or not applicable: Secondary | ICD-10-CM | POA: Insufficient documentation

## 2022-03-23 DIAGNOSIS — R600 Localized edema: Secondary | ICD-10-CM | POA: Insufficient documentation

## 2022-03-23 DIAGNOSIS — M25552 Pain in left hip: Secondary | ICD-10-CM | POA: Diagnosis present

## 2022-03-23 DIAGNOSIS — R262 Difficulty in walking, not elsewhere classified: Secondary | ICD-10-CM | POA: Diagnosis not present

## 2022-03-23 DIAGNOSIS — M6281 Muscle weakness (generalized): Secondary | ICD-10-CM | POA: Diagnosis not present

## 2022-03-23 DIAGNOSIS — Y939 Activity, unspecified: Secondary | ICD-10-CM | POA: Insufficient documentation

## 2022-03-23 DIAGNOSIS — S73192A Other sprain of left hip, initial encounter: Secondary | ICD-10-CM | POA: Insufficient documentation

## 2022-03-23 MED ORDER — INFLUENZA VAC SPLIT QUAD 0.5 ML IM SUSY
PREFILLED_SYRINGE | INTRAMUSCULAR | 0 refills | Status: DC
  Filled 2022-03-23: qty 0.5, 1d supply, fill #0

## 2022-03-23 NOTE — Therapy (Signed)
OUTPATIENT PHYSICAL THERAPY LOWER EXTREMITY EVALUATION   Patient Name: Maria Smith MRN: [id number] DOB:04-07-1996, 26 y.o., female Today's Date: 03/23/2022   PT End of Session - 03/23/22 1026     Visit Number 1    Number of Visits 30    Date for PT Re-Evaluation 06/21/22    Authorization Type [employer]    PT Start Time 0930    PT Stop Time 1015    PT Time Calculation (min) 45 min    Activity Tolerance Patient tolerated treatment well    Behavior During Therapy WFL for tasks assessed/performed             Past Medical History:  Diagnosis Date   Allergic rhinitis    Allergy    SEASONAL   Asthma    Complication of anesthesia    Low BP   Eczema    Environmental allergies    Age 89 years   Family history of adverse reaction to anesthesia    Mother went into coma after anesthesia. 4 days to wake up   Fatty liver 12/11/2018   Noted on CT 6/20   Generalized headaches    Migraines. 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   Streptococcal infection(041.00)    Age 49 and 26 years old   TB lung, latent    2017   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/2018   Diffuse enlargement on [place] 6/20   Past Surgical History:  Procedure Laterality Date   ESOPHAGOGASTRODUODENOSCOPY ENDOSCOPY  2021   TONSILLECTOMY  06/18/2010   WISDOM TOOTH EXTRACTION     Patient Active Problem List   Diagnosis Date Noted   Tear of left acetabular labrum    Pain in left hip 01/27/2021   Bruising 01/27/2021   Left-sided chest wall pain 03/23/2020   Positive ANA (antinuclear antibody) 03/23/2020   Asthma 02/10/2020   Allergic rhinitis 02/10/2020   GERD (gastroesophageal reflux disease) 02/10/2020   History of TB (tuberculosis) 08/21/2019   Throat pain 01/28/2019   Odynophagia 01/28/2019   Neck pain 01/28/2019   Thyroiditis 01/12/2019   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/2018   Fatty liver 12/11/2018   PCOS (polycystic  ovarian syndrome) 11/11/2018   Seasonal allergies 09/22/2018   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   Migraine with aura and without status migrainosus, not intractable 09/11/2012   Variants of migraine, not elsewhere classified, without mention of intractable migraine without mention of status migrainosus 09/11/2012   Unspecified constipation 09/11/2012   Syncope 08/31/2010   Neck Pain 08/31/2010   Insomnia 08/31/2010    PCP:   Deeann Saint, MD     REFERRING PROVIDER: Huel Cote, MD   REFERRING DIAG: [phone] (ICD-10-CM) - Tear of left acetabular labrum, initial encounter   THERAPY DIAG:  Pain in left hip  Muscle weakness (generalized)  Difficulty walking  Localized edema  Rationale for Evaluation and Treatment Rehabilitation  ONSET DATE: 03/20/22    1.  Left hip labral repair 2.  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 Works for [employer] at [employer].   Pt states since surgery she has a lot of pain and has difficulty with the hip. Unsure of if brace on properly. Pt denies signs of infection. Pt has pain with movement.  Pt is only 3 days out from surgery at this time. Pt denies NT.   PERTINENT HISTORY: N/A  PAIN:  Are you having pain? Yes: NPRS scale: 9/10 Pain location: side and at incision Pain description: burning, aching, spasm Aggravating factors: movement Relieving factors: rest  PRECAUTIONS: Other: Hip  WEIGHT BEARING RESTRICTIONS Yes TTWB for 2 wks post op  FALLS:  Has patient fallen in last 6 months? 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 Small focal cartilage defect at the superolateral acetabulum of the left hip. 2. Smooth cleft of contrast at the anterior chondrolabral junction is favored to represent a normal sublabral sulcus. No evidence of a labral tear. 3. Trace left peritrochanteric bursal fluid.  The cleft of contrast at the anterior chondrolabral junction of the left hip described in impression #2 may represent a normal sublabral sulcus. A partially detached labral tear could have a similar appearance in the appropriate clinical setting. Correlate with exam.    PATIENT SURVEYS:  FOTO 11 60 @ DC 19 pts MCII  COGNITION:  Overall cognitive status: Within functional limits for tasks assessed     SENSATION: WFL  EDEMA:  Mild edema around both lateral hip incision sites  POSTURE: No Significant postural limitations  PALPATION: Soreness around incision sites, no signs of erythema, infection, drainage  LOWER EXTREMITY MMT:not indicated at eval  LOWER EXTREMITY ROM  PROM Right eval Left eval  Hip flexion WFL 90  Hip extension WFL 0  Hip abduction WFL 30  Hip adduction    Hip internal rotation WFL 10  Hip external rotation WFL 10   (Blank rows = not tested)   FUNCTIONAL TESTS:  Transfers: able to perform STS and self management of AD with SBA  GAIT: Distance walked: 78ft Assistive device utilized: Crutches Level of assistance: Modified independence Comments: prefers to do NWB, WFL once AD height adjusted appropriately  Stair: able to demo safe technique with single crutch and R UE support/railing    TODAY'S TREATMENT:  Surgical bandage change; adjustment of brace for flexion to 90 and 0 ext; AD sizing; gait/safety with crutches and WC  Exercises - Supine Posterior Pelvic Tilt  - 2 x daily - 7 x weekly - 2 sets - 10 reps - 2 hold - Long Sitting Hamstring Set - Internal Rotation  - 2 x daily - 7 x weekly - 2 sets - 10  reps - 2 hold - Supine Gluteal Sets  - 2 x daily - 7 x weekly - 2 sets - 10 reps - 2 hold - Supine Ankle Pumps  - 5-6 x daily - 7 x weekly - 1 sets - 20 reps   PATIENT EDUCATION:  Education details: MOI, diagnosis, prognosis, anatomy, exercise progression, DOMS expectations, muscle firing,  envelope of function, HEP, POC  Person educated: Patient Education method: Explanation, Demonstration, Tactile cues, Verbal cues, and Handouts Education comprehension: verbalized understanding, returned demonstration, verbal cues required, and tactile cues required   HOME EXERCISE PROGRAM: Access Code: [email or web address] URL: [email or web address] Date: 03/23/2022 Prepared by: Daleen Bo  ASSESSMENT:  CLINICAL IMPRESSION: Patient is a 26 y.o. female who was seen today for physical therapy evaluation and treatment for s/p L  hip labral arthroscopy and labral repair. Pt presents today with increased pain and soreness as expected given 3 days post-op. Pt without signs of infection and able to demo safe transfers and gait with TTWB precautions. Given pain and being 3 days out, pt advised to use R LE for assistance at this time. Consider progression of muscle activation and continue with PROM per protocol. Pt would benefit from continued skilled therapy in order to reach goals and maximize functional L LE strength and ROM for full return to PLOF. Marland Kitchen    OBJECTIVE IMPAIRMENTS Abnormal gait, decreased activity tolerance, decreased balance, decreased endurance, decreased knowledge of use of DME, decreased mobility, difficulty walking, decreased ROM, decreased strength, hypomobility, increased edema, increased fascial restrictions, increased muscle spasms, impaired flexibility, improper body mechanics, and pain.   ACTIVITY LIMITATIONS carrying, lifting, sitting, standing, squatting, sleeping, stairs, transfers, bed mobility, bathing, toileting, dressing, locomotion level, and caring for others  PARTICIPATION  LIMITATIONS: meal prep, cleaning, laundry, interpersonal relationship, driving, shopping, community activity, occupation, yard work, and exercise  PERSONAL FACTORS Time since onset of injury/illness/exacerbation and 1-2 comorbidities:    are also affecting patient's functional outcome.   REHAB POTENTIAL: Good  CLINICAL DECISION MAKING: Stable/uncomplicated  EVALUATION COMPLEXITY: Low   GOALS:   SHORT TERM GOALS: Target date: 05/04/2022  Pt will become independent with HEP in order to demonstrate synthesis of PT education.   Goal status: INITIAL  2.  Pt will be able to demonstrate normal gait with no AD and hip brace in order to demonstrate functional improvement in LE function for self-care and house hold duties.   Goal status: INITIAL  3.  Pt will be able to demonstrate reciprocal stair step pattern with single UE support in order to demonstrate functional improvement in LE function for self-care and house hold duties.   Goal status: INITIAL   LONG TERM GOALS: Target date: 06/15/2022   Pt  will become independent with final HEP in order to demonstrate synthesis of PT education.  Goal status: INITIAL  2.  Pt will be able to demonstrate DL squat with >/=68TMH in order to demonstrate functional improvement in bilat LE strength for return to exercise.   Goal status: INITIAL  3.  Pt will be able to demonstrate full lunge on L LE in order to demonstrate functional improvement in LE function for improvement in L LE strength for return to exercise and PLOF. Goal status: INITIAL  4.  Pt will be able to demonstrate ability to agility ladder drills without pain in order to demonstrate functional improvement and tolerance to low level plyometric loading.   Goal status: INITIAL  5.  Pt will score >/= 60 on FOTO to demonstrate improvement in perceived L hip function.  Goal status: INITIAL   PLAN: PT FREQUENCY: 1-2x/week  PT DURATION: 12 weeks  PLANNED INTERVENTIONS:  Therapeutic exercises, Therapeutic activity, Neuromuscular re-education, Balance training, Gait training, Patient/Family education, Self Care, Joint mobilization, Joint manipulation, Stair training, DME instructions, Aquatic Therapy, Dry Needling, Electrical stimulation, Wheelchair mobility training, Spinal manipulation, Spinal mobilization, Cryotherapy, Moist heat, scar mobilization, Splintting, Taping, Vasopneumatic device, Traction, Ultrasound, Ionotophoresis 4mg /ml Dexamethasone, Manual therapy, and Re-evaluation  PLAN FOR NEXT SESSION: PROM per protocol, clams and bridges as tolerated   , PT 03/23/2022, 10:49 AM

## 2022-03-26 NOTE — Progress Notes (Signed)
HPI: Maria Smith is a 26 y.o. female, who is here today with her spouse to establish care.  Former PCP: Dr. Volanda Napoleon Last preventive routine visit: 09/2019.  She has undergone left hip arthroscopic labral repair surgery on 03/20/2022.  No history of trauma, she ascribes problem "to wear and tear"from her work as an Public relations account executive.  She has started physical therapy following the surgery.  She reports difficulty swallowing pills, this is not new, she wonders if hydrocodone-acetaminophen can be prescribed in liquid form, she has been splitting tablets.  She has been experiencing a sore throat since surgery. She also reports having had bruising in their throat after the surgery,she took a picture and seems like she had a small blood clot on right soft palate. She is not sure about difficult intubation. She mentions that her voice was "raspy"  after the surgery but has since improved.   She is taking aspirin 325 mg daily for DVT prevention.  Albuterol inh for asthma, and uses her inhaler approximately once per week.  She has not been on Flovent for several months. Her asthma is generally well controlled.  Allergy rhinitis: Currently denies obtainment, nasal congestion, rhinorrhea, postnasal drainage. Negative for fever or chills. The patient has been on Xyzal and Flonase nasal spray.  She have recently received a flu shot.  Review of Systems  Constitutional:  Positive for activity change and fatigue. Negative for appetite change and fever.  HENT:  Negative for mouth sores and nosebleeds.   Eyes:  Negative for photophobia and redness.  Respiratory:  Negative for cough, shortness of breath and wheezing.   Cardiovascular:  Negative for chest pain, palpitations and leg swelling.  Gastrointestinal:  Negative for abdominal pain, nausea and vomiting.  Genitourinary:  Negative for decreased urine volume, dysuria and hematuria.  Skin:  Negative for rash.  Allergic/Immunologic: Positive for environmental  allergies.  Neurological:  Negative for syncope and headaches.  Rest see pertinent positives and negatives per HPI.  Current Outpatient Medications on File Prior to Visit  Medication Sig Dispense Refill   acetaminophen (TYLENOL) 500 MG tablet Take 1 tablet (500 mg total) by mouth every 8 (eight) hours for 10 days. 30 tablet 0   aspirin EC 325 MG tablet Take 1 tablet (325 mg total) by mouth daily. 30 tablet 0   ferrous sulfate 325 (65 FE) MG tablet Take 325 mg by mouth every other day.     HYDROcodone-acetaminophen (NORCO/VICODIN) 5-325 MG tablet Take 1 tablet by mouth every 6 (six) hours as needed for moderate pain. 30 tablet 0   ibuprofen (ADVIL) 800 MG tablet Take 1 tablet (800 mg total) by mouth every 8 (eight) hours for 10 days. Please take with food, please alternate with acetaminophen 30 tablet 0   influenza vac split quadrivalent PF (FLUARIX) 0.5 ML injection Inject into the muscle. 0.5 mL 0   Respiratory Therapy Supplies (NEBULIZER/TUBING/MOUTHPIECE) KIT Use as directed. 1 kit 3   Vitamin D-Vitamin K (VITAMIN K2-VITAMIN D3 PO) Take 2 drops by mouth daily. Omega 3 2 Drops =5000 Units     acetaminophen (TYLENOL CHILDRENS) 160 MG/5ML suspension Take 31.3 mLs (1,000 mg total) by mouth every 8 (eight) hours as needed. (Patient not taking: Reported on 02/27/2022) 500 mL 0   ibuprofen (ADVIL) 100 MG/5ML suspension Take 30 mLs (600 mg total) by mouth every 8 (eight) hours as needed. (Patient not taking: Reported on 02/27/2022) 450 mL 0   magic mouthwash (nystatin, lidocaine, diphenhydrAMINE) suspension Take 5 mLs by mouth  4 (four) times daily as needed for mouth pain. Swish and spit (Patient not taking: Reported on 02/27/2022) 180 mL 0   Olopatadine HCl 0.2 % SOLN INSTILL 1 DROP INTO AFFECTED EYE EVERY DAY (Patient not taking: Reported on 02/27/2022) 7.5 mL 1   [DISCONTINUED] levocetirizine (XYZAL) 5 MG tablet TAKE 1 TABLET BY MOUTH EVERY DAY IN THE EVENING 30 tablet 0   No current  facility-administered medications on file prior to visit.   Past Medical History:  Diagnosis Date   Allergic rhinitis    Allergy    SEASONAL   Asthma    Complication of anesthesia    Low BP   Eczema    Environmental allergies    Age 44 years   Family history of adverse reaction to anesthesia    Mother went into coma after anesthesia. 4 days to wake up   Fatty liver 12/11/2018   Noted on CT 6/20   Generalized headaches    Migraines. 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   Streptococcal infection(041.00)    Age 59 and 26 years old   TB lung, latent    2017   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/2018   Diffuse enlargement on [place] 6/20   No Known Allergies  Family History  Problem Relation Age of Onset   Migraines Paternal 71    Migraines Paternal Grandfather    HIV Father    Diabetes Mother    Hypertension Mother    Anemia Mother    COPD Mother    CVA Mother    Congestive Heart Failure Mother    Cirrhosis Mother    Obesity Mother    Irritable bowel syndrome Mother    Rheum arthritis Mother    Ulcers Brother    Anemia Sister    Cholecystitis Brother    Migraines Paternal Aunt    Migraines Maternal Uncle        Maternal Great Uncle   Diabetes Other        Family History   Hypertension Other        Family History   Depression Other        Family History   Asthma Other        Family History   Allergic rhinitis Other        Family History   Asthma Brother    Colon cancer Neg Hx    Colon polyps Neg Hx    Esophageal cancer Neg Hx    Rectal cancer Neg Hx    Stomach cancer Neg Hx    Social History   Socioeconomic History   Marital status: Married    Spouse name: Not on file   Number of children: 2   Years of education: Not on file   Highest education level: Bachelor's degree (e.g., BA, AB, BS)  Occupational History   Occupation: dietary  Tobacco Use    Smoking status: Never   Smokeless tobacco: Never  Vaping Use   Vaping Use: Never used  Substance and Sexual Activity   Alcohol use: Not Currently   Drug use: No   Sexual activity: Yes    Partners: Female    Comment: female partner  Other Topics Concern   Not on file  Social History Narrative   Patient works at [employer]   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    Social Determinants of Health   Financial Resource Strain: Low Risk  (08/29/2021)   Overall Financial Resource Strain (CARDIA)    Difficulty of Paying Living Expenses: Not hard at all  Food Insecurity: No Food Insecurity (08/29/2021)   Hunger Vital Sign    Worried About Running Out of Food in the Last Year: Never true    Ran Out of Food in the Last Year: Never true  Transportation Needs: No Transportation Needs (08/29/2021)   PRAPARE - Hydrologist (Medical): No    Lack of Transportation (Non-Medical): No  Physical Activity: Insufficiently Active (08/29/2021)   Exercise Vital Sign    Days of Exercise per Week: 2 days    Minutes of Exercise per Session: 30 min  Stress: No Stress Concern Present (08/29/2021)   [place]    Feeling of Stress : Not at all  Social Connections: Unknown (08/29/2021)   Social Connection and Isolation Panel [NHANES]    Frequency of Communication with Friends and Family: More than three times a week    Frequency of Social Gatherings with Friends and Family: Patient refused    Attends Religious Services: Patient refused    Active Member of Clubs or Organizations: No    Attends Archivist Meetings: Not on file    Marital Status: Married   Vitals:   03/27/22 0752  BP: 110/70  Pulse: 85  Resp: 12  SpO2: 99%   Body mass index is 23.43 kg/m.  Physical Exam Vitals and nursing note  reviewed.  Constitutional:      General: She is not in acute distress.    Appearance: She is well-developed.  HENT:     Head: Normocephalic and atraumatic.     Mouth/Throat:     Mouth: Mucous membranes are moist.     Pharynx: Oropharynx is clear. Uvula midline. No uvula swelling.  Eyes:     Conjunctiva/sclera: Conjunctivae normal.  Cardiovascular:     Rate and Rhythm: Normal rate and regular rhythm.     Heart sounds: No murmur heard. Pulmonary:     Effort: Pulmonary effort is normal. No respiratory distress.     Breath sounds: Normal breath sounds.  Abdominal:     Palpations: Abdomen is soft. There is no hepatomegaly or mass.     Tenderness: There is no abdominal tenderness.  Musculoskeletal:     Left hip: Decreased range of motion (Hip post surgical bcrace in place.).  Lymphadenopathy:     Cervical: No cervical adenopathy.  Skin:    General: Skin is warm.     Findings: No erythema or rash.  Neurological:     General: No focal deficit present.     Mental Status: She is alert and oriented to person, place, and time.     Cranial Nerves: No cranial nerve deficit.     Comments: Gait assisted with crutches.  Psychiatric:        Mood and Affect: Affect normal. Mood is anxious.   ASSESSMENT AND PLAN:  Adia was seen today for establish care.  Diagnoses and all orders for this visit:  Moderate persistent asthma without complication    Problem is well controlled.  Continue using Albuterol inhaler 2 puff qid as needed.       Monitor Albuterol inh usage; if it increases, consider resuming  Flovent  -     albuterol (VENTOLIN HFA) 108 (90 Base) MCG/ACT inhaler; Inhale 2 puffs into the lungs every 6 (six) hours as needed for wheezing or shortness of breath.  Seasonal allergies   - Prescription for Zyrtec 10 mg and Flonase to be sent to the pharmacy.    - Use Flonase nasal spray daily as needed, some side effects discussed, including potential nasal dryness and bleeding if  overused    - Utilize nasal saline irrigations as needed for nasal rinsing.  -     cetirizine (ZYRTEC) 10 MG tablet; Take 1 tablet (10 mg total) by mouth daily as needed for allergies. -     fluticasone (FLONASE) 50 MCG/ACT nasal spray; Place 2 sprays into both nostrils daily.  Sore throat Hx suggest possible trauma during intubation. Oropharyngeal inspection today normal. Not longer dysphonic.    - Use throat lozenges for symptomatic relief    - I am anticipating complete resolution of symptoms in a few weeks; if no improvement in three months, consider referral to an ENT for laryngoscopy    - Inform future surgical teams of potential difficult intubation.  S/P hip arthroscopy    - Continue physical therapy as prescribed by the orthopedist    - Attend post-op check appointment with the orthopedist on October 16th    - Compression socks, Aspirin,and ambulation every couple hours for DVT prevention  I spent a total of 35 minutes in both face to face and non face to face activities for this visit on the date of this encounter. During this time history was obtained and documented, examination was performed, and assessment/plan discussed.  Return in about 6 months (around 10/10/2022) for cpe.  Allante Whitmire G. Martinique, MD  [facility]. [place] office.

## 2022-03-27 ENCOUNTER — Encounter: Payer: Self-pay | Admitting: Family Medicine

## 2022-03-27 ENCOUNTER — Ambulatory Visit (INDEPENDENT_AMBULATORY_CARE_PROVIDER_SITE_OTHER): Payer: Commercial Managed Care - HMO | Admitting: Family Medicine

## 2022-03-27 VITALS — BP 110/70 | HR 85 | Resp 12 | Ht 59.0 in

## 2022-03-27 DIAGNOSIS — Z9889 Other specified postprocedural states: Secondary | ICD-10-CM

## 2022-03-27 DIAGNOSIS — J029 Acute pharyngitis, unspecified: Secondary | ICD-10-CM

## 2022-03-27 DIAGNOSIS — J4540 Moderate persistent asthma, uncomplicated: Secondary | ICD-10-CM

## 2022-03-27 DIAGNOSIS — J302 Other seasonal allergic rhinitis: Secondary | ICD-10-CM | POA: Diagnosis not present

## 2022-03-27 MED ORDER — ALBUTEROL SULFATE HFA 108 (90 BASE) MCG/ACT IN AERS: 2.0000 | INHALATION_SPRAY | Freq: Four times a day (QID) | RESPIRATORY_TRACT | 2 refills | Status: DC | PRN

## 2022-03-27 MED ORDER — CETIRIZINE HCL 10 MG PO TABS: 10.0000 mg | ORAL_TABLET | Freq: Every day | ORAL | 3 refills | Status: DC | PRN

## 2022-03-27 MED ORDER — FLUTICASONE PROPIONATE 50 MCG/ACT NA SUSP: 2.0000 | Freq: Every day | NASAL | 1 refills | Status: DC

## 2022-03-27 NOTE — Patient Instructions (Addendum)
A few things to remember from today's visit:   Moderate persistent asthma without complication - Plan: albuterol (VENTOLIN HFA) 108 (90 Base) MCG/ACT inhaler  Seasonal allergies - Plan: cetirizine (ZYRTEC) 10 MG tablet  Sore throat   Based on our discussion and examination, I have provided a list of instructions for you to follow:  1. Continue taking your prescribed medications, including vitamin D supplementation, ibuprofen as needed, hydrocodone-acetaminophen for post-surgery pain, aspirin for DVT prevention, and albuterol for asthma. Remember that you can split the hydrocodone-acetaminophen pills into smaller pieces if needed.  2. Use throat lozenges to help soothe your sore throat. If your sore throat doesn't improve within three months, we may consider a referral to an ENT.  3. Continue wearing compression socks for blood clot prevention. Remember that physical therapy and moving around can also help prevent blood clots.  4. A prescription for Zyrtec 10 milligrams and Flonase has been sent to your pharmacy. Be cautious not to overuse Flonase, as it can cause nasal dryness and bleeding.  5. Use nasal saline irrigations as needed to rinse your nose.  6. Monitor your Albuterol usage. If it increases, we may need to consider resuming Flovent.  7. Your next appointment with the orthopedist is on October 16th for a post-op check. Please attend this appointment as scheduled.  8. Cholesterol screening can be done during your next physical examination.   If you need refills for medications you take chronically, please call your pharmacy. Do not use My Chart to request refills or for acute issues that need immediate attention. If you send a my chart message, it may take a few days to be addressed, specially if I am not in the office.  Please be sure medication list is accurate. If a new problem present, please set up appointment sooner than planned today.

## 2022-03-29 ENCOUNTER — Encounter (HOSPITAL_BASED_OUTPATIENT_CLINIC_OR_DEPARTMENT_OTHER): Payer: Self-pay | Admitting: Physical Therapy

## 2022-03-29 ENCOUNTER — Ambulatory Visit (HOSPITAL_BASED_OUTPATIENT_CLINIC_OR_DEPARTMENT_OTHER): Payer: Commercial Managed Care - HMO | Admitting: Physical Therapy

## 2022-03-29 DIAGNOSIS — R600 Localized edema: Secondary | ICD-10-CM

## 2022-03-29 DIAGNOSIS — M6281 Muscle weakness (generalized): Secondary | ICD-10-CM

## 2022-03-29 DIAGNOSIS — R262 Difficulty in walking, not elsewhere classified: Secondary | ICD-10-CM

## 2022-03-29 DIAGNOSIS — M25552 Pain in left hip: Secondary | ICD-10-CM

## 2022-03-29 DIAGNOSIS — S73192A Other sprain of left hip, initial encounter: Secondary | ICD-10-CM | POA: Diagnosis not present

## 2022-03-29 NOTE — Therapy (Signed)
OUTPATIENT PHYSICAL THERAPY LOWER EXTREMITY TREATMENT   Patient Name: Maria Smith MRN: [id number] DOB:1995/07/09, 26 y.o., female Today's Date: 03/29/2022   PT End of Session - 03/29/22 0843     Visit Number 2    Number of Visits 30    Date for PT Re-Evaluation 06/21/22    Authorization Type [employer]    PT Start Time 0845    PT Stop Time 0915    PT Time Calculation (min) 30 min    Activity Tolerance Patient tolerated treatment well    Behavior During Therapy [facility] for tasks assessed/performed              Past Medical History:  Diagnosis Date   Allergic rhinitis    Allergy    SEASONAL   Asthma    Complication of anesthesia    Low BP   Eczema    Environmental allergies    Age 19 years   Family history of adverse reaction to anesthesia    Mother went into coma after anesthesia. 4 days to wake up   Fatty liver 12/11/2018   Noted on CT 6/20   Generalized headaches    Migraines. 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 Rifampin since 08/2015   Streptococcal infection(041.00)    Age 82 and 26 years old   TB lung, latent    2017   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/2018   Diffuse enlargement on [place] 6/20   Past Surgical History:  Procedure Laterality Date   ESOPHAGOGASTRODUODENOSCOPY ENDOSCOPY  2021   HIP ARTHROSCOPY Left 03/20/2022   Procedure: LEFT HIP ARTHROSCOPY WITH LABRAL REPAIR;  Surgeon: Huel Cote, MD;  Location: [facility] OR;  Service: Orthopedics;  Laterality: Left;   TONSILLECTOMY  06/18/2010   WISDOM TOOTH EXTRACTION     Patient Active Problem List   Diagnosis Date Noted   Tear of left acetabular labrum    Pain in left hip 01/27/2021   Bruising 01/27/2021   Left-sided chest wall pain 03/23/2020   Positive ANA (antinuclear antibody) 03/23/2020   Asthma 02/10/2020   Allergic rhinitis 02/10/2020   GERD (gastroesophageal reflux disease) 02/10/2020   History of TB (tuberculosis)  08/21/2019   Throat pain 01/28/2019   Odynophagia 01/28/2019   Neck pain 01/28/2019   Thyroiditis 01/12/2019   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/2018   Fatty liver 12/11/2018   PCOS (polycystic ovarian syndrome) 11/11/2018   Seasonal allergies 09/22/2018   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   Migraine with aura and without status migrainosus, not intractable 09/11/2012   Variants of migraine, not elsewhere classified, without mention of intractable migraine without mention of status migrainosus 09/11/2012   Unspecified constipation 09/11/2012   Syncope 08/31/2010   Neck Pain 08/31/2010   Insomnia 08/31/2010    PCP:   Deeann Saint, MD     REFERRING PROVIDER: Huel Cote, MD   REFERRING DIAG: [phone] (ICD-10-CM) - Tear of left acetabular labrum, initial encounter   THERAPY DIAG:  Pain in left hip  Muscle weakness (generalized)  Difficulty walking  Localized edema  Rationale for Evaluation and Treatment Rehabilitation  ONSET DATE: 03/20/22    1.  Left hip labral repair 2.  Left hip pincer debridement   Days since surgery: 9   SUBJECTIVE:   SUBJECTIVE STATEMENT:  Pt states that the anterior incision site bothers her. It is does not appear swollen or infected. Pt states he has aching pain  int othe front the hip. She states her back is what bothers her most.    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 Works for [employer] at [employer].   Pt states since surgery she has a lot of pain and has difficulty with the hip. Unsure of if brace on properly. Pt denies signs of infection. Pt has pain with movement. Pt is only 3 days out from surgery at this time. Pt denies NT.   PERTINENT HISTORY: N/A  PAIN:  Are you having pain? Yes: NPRS scale: 9/10 Pain location: side and at incision Pain description: burning, aching, spasm Aggravating factors: movement Relieving factors: rest  PRECAUTIONS: Other: Hip  WEIGHT BEARING RESTRICTIONS Yes  TTWB for 2 wks post op  FALLS:  Has patient fallen in last 6 months? 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 Small focal cartilage defect at the superolateral acetabulum of the left hip. 2. Smooth cleft of contrast at the anterior chondrolabral junction is favored to represent a normal sublabral sulcus. No evidence of a labral tear. 3. Trace left peritrochanteric bursal fluid.  The cleft of contrast at the anterior chondrolabral junction of the left hip described in impression #2 may represent a normal sublabral sulcus. A partially detached labral tear could have a similar appearance in the appropriate clinical setting. Correlate with exam.    PATIENT SURVEYS:  FOTO 11 60 @ DC 19 pts [facility]  TODAY'S TREATMENT:  PROM flexion to 80, ABD to 30, IR 10, ER 15   STM R glute, R QL  Exercises - Supine Posterior Pelvic Tilt  - 2 x daily - 7 x weekly - 2 sets - 10 reps - 2 hold - S/L clam to 30 ABD max 2x10  Adjustment of brace and review of precautions  PATIENT EDUCATION:  Education details: anatomy, exercise progression, DOMS expectations, muscle firing,  envelope of function, HEP, POC  Person educated: Patient Education method: Explanation, Demonstration, Tactile cues, Verbal cues, and Handouts Education comprehension: verbalized understanding, returned demonstration, verbal cues required, and tactile cues required   HOME EXERCISE PROGRAM: Access Code: [email or web address] URL: [email or web address] Date: 03/23/2022 Prepared by: Daleen Bo  ASSESSMENT:  CLINICAL IMPRESSION: Pt presents today with better managed pain but complaints  of hip soreness, LBP, and L LE paresthesia. Pt presented picture of incision site and red irritation appears to be ingrown hair from shaving during surgery. No signs of drainage or erythema. Pt able to improve PROM today and able to start light glute activation exercise without pain. HEP updated at this time. Plan to continue with PROM as tolerated. Pt to see MD Monday for 2 wk follow up. Pt would benefit from continued skilled therapy in order to reach goals and maximize functional L LE strength and ROM for full return to PLOF. Marland Kitchen    OBJECTIVE IMPAIRMENTS Abnormal gait, decreased activity tolerance, decreased balance, decreased endurance, decreased knowledge of use of DME, decreased mobility, difficulty walking, decreased ROM, decreased strength, hypomobility, increased edema, increased fascial restrictions, increased muscle spasms, impaired flexibility, improper body mechanics, and pain.   ACTIVITY LIMITATIONS carrying, lifting, sitting, standing, squatting, sleeping, stairs, transfers, bed mobility,  bathing, toileting, dressing, locomotion level, and caring for others  PARTICIPATION LIMITATIONS: meal prep, cleaning, laundry, interpersonal relationship, driving, shopping, community activity, occupation, yard work, and exercise  PERSONAL FACTORS Time since onset of injury/illness/exacerbation and 1-2 comorbidities:    are also affecting patient's functional outcome.   REHAB POTENTIAL: Good  CLINICAL DECISION MAKING: Stable/uncomplicated  EVALUATION COMPLEXITY: Low   GOALS:   SHORT TERM GOALS: Target date: 05/10/2022  Pt will become independent with HEP in order to demonstrate synthesis of PT education.   Goal status: INITIAL  2.  Pt will be able to demonstrate normal gait with no AD and hip brace in order to demonstrate functional improvement in LE function for self-care and house hold duties.   Goal status: INITIAL  3.  Pt will be able to demonstrate reciprocal stair step pattern  with single UE support in order to demonstrate functional improvement in LE function for self-care and house hold duties.   Goal status: INITIAL   LONG TERM GOALS: Target date: 06/21/2022   Pt  will become independent with final HEP in order to demonstrate synthesis of PT education.  Goal status: INITIAL  2.  Pt will be able to demonstrate DL squat with >/=00XFG in order to demonstrate functional improvement in bilat LE strength for return to exercise.   Goal status: INITIAL  3.  Pt will be able to demonstrate full lunge on L LE in order to demonstrate functional improvement in LE function for improvement in L LE strength for return to exercise and PLOF. Goal status: INITIAL  4.  Pt will be able to demonstrate ability to agility ladder drills without pain in order to demonstrate functional improvement and tolerance to low level plyometric loading.   Goal status: INITIAL  5.  Pt will score >/= 60 on FOTO to demonstrate improvement in perceived L hip function.  Goal status: INITIAL   PLAN: PT FREQUENCY: 1-2x/week  PT DURATION: 12 weeks  PLANNED INTERVENTIONS: Therapeutic exercises, Therapeutic activity, Neuromuscular re-education, Balance training, Gait training, Patient/Family education, Self Care, Joint mobilization, Joint manipulation, Stair training, DME instructions, Aquatic Therapy, Dry Needling, Electrical stimulation, Wheelchair mobility training, Spinal manipulation, Spinal mobilization, Cryotherapy, Moist heat, scar mobilization, Splintting, Taping, Vasopneumatic device, Traction, Ultrasound, Ionotophoresis 4mg /ml Dexamethasone, Manual therapy, and Re-evaluation  PLAN FOR NEXT SESSION: PROM per protocol, clams and bridges as tolerated   , PT 03/29/2022, 9:21 AM

## 2022-03-30 ENCOUNTER — Encounter (HOSPITAL_BASED_OUTPATIENT_CLINIC_OR_DEPARTMENT_OTHER): Payer: Commercial Managed Care - HMO | Admitting: Physical Therapy

## 2022-04-02 ENCOUNTER — Ambulatory Visit (INDEPENDENT_AMBULATORY_CARE_PROVIDER_SITE_OTHER): Payer: Commercial Managed Care - HMO | Admitting: Orthopaedic Surgery

## 2022-04-02 DIAGNOSIS — S73192A Other sprain of left hip, initial encounter: Secondary | ICD-10-CM

## 2022-04-02 NOTE — Progress Notes (Signed)
Post Operative Evaluation    Procedure/Date of Surgery: Left hip arthroscopy with labral repair 03/20/2022  Interval History:   Presents today 2 weeks status post the above procedure.  Overall she is doing very well.  Overall pain is fairly well controlled at today's visit.  She has been compliant with aspirin usage.  She has been compliant with rehab protocol nonweightbearing.  She has begun physical therapy.   PMH/PSH/Family History/Social History/Meds/Allergies:    Past Medical History:  Diagnosis Date   Allergic rhinitis    Allergy    SEASONAL   Asthma    Complication of anesthesia    Low BP   Eczema    Environmental allergies    Age 26 years   Family history of adverse reaction to anesthesia    Mother went into coma after anesthesia. 4 days to wake up   Fatty liver 12/11/2018   Noted on CT 6/20   Generalized headaches    Migraines. 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 08/2015   Streptococcal infection(041.00)    Age 89 and 26 years old   TB lung, latent    2017   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/2018   Diffuse enlargement on [place] 6/20   Past Surgical History:  Procedure Laterality Date   ESOPHAGOGASTRODUODENOSCOPY ENDOSCOPY  2021   HIP ARTHROSCOPY Left 03/20/2022   Procedure: LEFT HIP ARTHROSCOPY WITH LABRAL REPAIR;  Surgeon: Vanetta Mulders, MD;  Location: [facility];  Service: Orthopedics;  Laterality: Left;   TONSILLECTOMY  06/18/2010   WISDOM TOOTH EXTRACTION     Social History   Socioeconomic History   Marital status: Married    Spouse name: Not on file   Number of children: 2   Years of education: Not on file   Highest education level: Bachelor's degree (e.g., BA, AB, BS)  Occupational History   Occupation: dietary  Tobacco Use   Smoking status: Never   Smokeless tobacco: Never  Vaping Use   Vaping Use: Never used  Substance and  Sexual Activity   Alcohol use: Not Currently   Drug use: No   Sexual activity: Yes    Partners: Female    Comment: female partner  Other Topics Concern   Not on file  Social History Narrative   Patient works at [employer]   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    Social Determinants of Health   Financial Resource Strain: Low Risk  (08/29/2021)   Overall Financial Resource Strain (CARDIA)    Difficulty of Paying Living Expenses: Not hard at all  Food Insecurity: No Food Insecurity (08/29/2021)   Hunger Vital Sign    Worried About Running Out of Food in the Last Year: Never true    Ran Out of Food in the Last Year: Never true  Transportation Needs: No Transportation Needs (08/29/2021)   PRAPARE - Hydrologist (Medical): No    Lack of Transportation (Non-Medical): No  Physical Activity: Insufficiently Active (08/29/2021)   Exercise Vital Sign    Days of Exercise per Week: 2  days    Minutes of Exercise per Session: 30 min  Stress: No Stress Concern Present (08/29/2021)   [place]    Feeling of Stress : Not at all  Social Connections: Unknown (08/29/2021)   Social Connection and Isolation Panel [NHANES]    Frequency of Communication with Friends and Family: More than three times a week    Frequency of Social Gatherings with Friends and Family: Patient refused    Attends Religious Services: Patient refused    Marine scientist or Organizations: No    Attends Music therapist: Not on file    Marital Status: Married   Family History  Problem Relation Age of Onset   Migraines Paternal Grandmother    Migraines Paternal Grandfather    HIV Father    Diabetes Mother    Hypertension Mother    Anemia Mother    COPD Mother    CVA Mother    Congestive Heart Failure Mother     Cirrhosis Mother    Obesity Mother    Irritable bowel syndrome Mother    Rheum arthritis Mother    Ulcers Brother    Anemia Sister    Cholecystitis Brother    Migraines Paternal Aunt    Migraines Maternal Uncle        Maternal Great Uncle   Diabetes Other        Family History   Hypertension Other        Family History   Depression Other        Family History   Asthma Other        Family History   Allergic rhinitis Other        Family History   Asthma Brother    Colon cancer Neg Hx    Colon polyps Neg Hx    Esophageal cancer Neg Hx    Rectal cancer Neg Hx    Stomach cancer Neg Hx    No Known Allergies Current Outpatient Medications  Medication Sig Dispense Refill   acetaminophen (TYLENOL CHILDRENS) 160 MG/5ML suspension Take 31.3 mLs (1,000 mg total) by mouth every 8 (eight) hours as needed. (Patient not taking: Reported on 02/27/2022) 500 mL 0   albuterol (VENTOLIN HFA) 108 (90 Base) MCG/ACT inhaler Inhale 2 puffs into the lungs every 6 (six) hours as needed for wheezing or shortness of breath. 8 g 2   aspirin EC 325 MG tablet Take 1 tablet (325 mg total) by mouth daily. 30 tablet 0   cetirizine (ZYRTEC) 10 MG tablet Take 1 tablet (10 mg total) by mouth daily as needed for allergies. 90 tablet 3   ferrous sulfate 325 (65 FE) MG tablet Take 325 mg by mouth every other day.     fluticasone (FLONASE) 50 MCG/ACT nasal spray Place 2 sprays into both nostrils daily. 16 g 1   HYDROcodone-acetaminophen (NORCO/VICODIN) 5-325 MG tablet Take 1 tablet by mouth every 6 (six) hours as needed for moderate pain. 30 tablet 0   ibuprofen (ADVIL) 100 MG/5ML suspension Take 30 mLs (600 mg total) by mouth every 8 (eight) hours as needed. (Patient not taking: Reported on 02/27/2022) 450 mL 0   influenza vac split quadrivalent PF (FLUARIX) 0.5 ML injection Inject into the muscle. 0.5 mL 0   magic mouthwash (nystatin, lidocaine, diphenhydrAMINE) suspension Take 5 mLs by mouth 4 (four) times daily as  needed for mouth pain. Swish and spit (Patient not taking: Reported on 02/27/2022) 180  mL 0   Olopatadine HCl 0.2 % SOLN INSTILL 1 DROP INTO AFFECTED EYE EVERY DAY (Patient not taking: Reported on 02/27/2022) 7.5 mL 1   Respiratory Therapy Supplies (NEBULIZER/TUBING/MOUTHPIECE) KIT Use as directed. 1 kit 3   Vitamin D-Vitamin K (VITAMIN K2-VITAMIN D3 PO) Take 2 drops by mouth daily. Omega 3 2 Drops =5000 Units     No current facility-administered medications for this visit.   No results found.  Review of Systems:   A ROS was performed including pertinent positives and negatives as documented in the HPI.   Musculoskeletal Exam:    There were no vitals taken for this visit.  Left hip incisions are well-appearing without erythema or drainage.  Passive forward elevation of the left hip is to 90 degrees with 10 degrees internal/external rotation with minimal to no pain.  There is no pain about the groin.  Able to straight leg raise of the left knee.  Remainder of distal neurosensory exam is intact 2+ dorsalis pedis pulse.  Imaging:      I personally reviewed and interpreted the radiographs.   Assessment:   2 weeks status post left hip arthroscopy with labral repair overall doing very well.  She will continue to advance according to protocol.  At this time she may begin partial weightbearing with a goal to advance to full weightbearing over the course of the next 2 weeks.  She will continue her brace for an additional 2 weeks.  All restrictions and activity limitations were discussed.  Plan :    -Return to clinic 4 weeks      I personally saw and evaluated the patient, and participated in the management and treatment plan.  Vanetta Mulders, MD Attending Physician, Orthopedic Surgery  This document was dictated using Dragon voice recognition software. A reasonable attempt at proof reading has been made to minimize errors.

## 2022-04-03 ENCOUNTER — Encounter (HOSPITAL_BASED_OUTPATIENT_CLINIC_OR_DEPARTMENT_OTHER): Payer: Self-pay | Admitting: Orthopaedic Surgery

## 2022-04-06 ENCOUNTER — Ambulatory Visit (HOSPITAL_BASED_OUTPATIENT_CLINIC_OR_DEPARTMENT_OTHER): Payer: Commercial Managed Care - HMO | Admitting: Physical Therapy

## 2022-04-06 ENCOUNTER — Encounter (HOSPITAL_BASED_OUTPATIENT_CLINIC_OR_DEPARTMENT_OTHER): Payer: Self-pay | Admitting: Physical Therapy

## 2022-04-06 DIAGNOSIS — R600 Localized edema: Secondary | ICD-10-CM

## 2022-04-06 DIAGNOSIS — M25552 Pain in left hip: Secondary | ICD-10-CM

## 2022-04-06 DIAGNOSIS — S73192A Other sprain of left hip, initial encounter: Secondary | ICD-10-CM | POA: Diagnosis not present

## 2022-04-06 DIAGNOSIS — R262 Difficulty in walking, not elsewhere classified: Secondary | ICD-10-CM

## 2022-04-06 DIAGNOSIS — M6281 Muscle weakness (generalized): Secondary | ICD-10-CM

## 2022-04-06 NOTE — Therapy (Signed)
OUTPATIENT PHYSICAL THERAPY LOWER EXTREMITY TREATMENT   Patient Name: Maria Smith MRN: [id number] DOB:[birth year], 26 y.o., female Today's Date: 04/06/2022   PT End of Session - 04/06/22 0924     Visit Number 3    Number of Visits 30    Date for PT Re-Evaluation 06/21/22    Authorization Type [employer]    PT Start Time 0845    PT Stop Time 0915    PT Time Calculation (min) 30 min    Activity Tolerance Patient tolerated treatment well    Behavior During Therapy [facility] for tasks assessed/performed               Past Medical History:  Diagnosis Date   Allergic rhinitis    Allergy    SEASONAL   Asthma    Complication of anesthesia    Low BP   Eczema    Environmental allergies    Age 74 years   Family history of adverse reaction to anesthesia    Mother went into coma after anesthesia. 4 days to wake up   Fatty liver 12/11/2018   Noted on CT 6/20   Generalized headaches    Migraines. 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 Rifampin since 08/2015   Streptococcal infection(041.00)    Age 35 and 25 years old   TB lung, latent    2017   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/2018   Diffuse enlargement on [place] 6/20   Past Surgical History:  Procedure Laterality Date   ESOPHAGOGASTRODUODENOSCOPY ENDOSCOPY  2021   HIP ARTHROSCOPY Left 03/20/2022   Procedure: LEFT HIP ARTHROSCOPY WITH LABRAL REPAIR;  Surgeon: Huel Cote, MD;  Location: [facility];  Service: Orthopedics;  Laterality: Left;   TONSILLECTOMY  06/18/2010   WISDOM TOOTH EXTRACTION     Patient Active Problem List   Diagnosis Date Noted   Tear of left acetabular labrum    Pain in left hip 01/27/2021   Bruising 01/27/2021   Left-sided chest wall pain 03/23/2020   Positive ANA (antinuclear antibody) 03/23/2020   Asthma 02/10/2020   Allergic rhinitis 02/10/2020   GERD (gastroesophageal reflux disease) 02/10/2020   History of TB (tuberculosis)  08/21/2019   Throat pain 01/28/2019   Odynophagia 01/28/2019   Neck pain 01/28/2019   Thyroiditis 01/12/2019   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/2018   Fatty liver 12/11/2018   PCOS (polycystic ovarian syndrome) 11/11/2018   Seasonal allergies 09/22/2018   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   Migraine with aura and without status migrainosus, not intractable 09/11/2012   Variants of migraine, not elsewhere classified, without mention of intractable migraine without mention of status migrainosus 09/11/2012   Unspecified constipation 09/11/2012   Syncope 08/31/2010   Neck Pain 08/31/2010   Insomnia 08/31/2010    PCP:   Deeann Saint, MD     REFERRING PROVIDER: Huel Cote, MD   REFERRING DIAG: [phone] (ICD-10-CM) - Tear of left acetabular labrum, initial encounter   THERAPY DIAG:  Pain in left hip  Muscle weakness (generalized)  Difficulty walking  Localized edema  Rationale for Evaluation and Treatment Rehabilitation  ONSET DATE: 03/20/22    1.  Left hip labral repair 2.  Left hip pincer debridement   Days since surgery: 17   SUBJECTIVE:   SUBJECTIVE STATEMENT:  Pt states states she is having some deep groin aching and lateral incision pain. She also complains of muscle spasms. Pt states  that MD visit went well.    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 Works for [employer] at [employer].   Pt states since surgery she has a lot of pain and has difficulty with the hip. Unsure of if brace on properly. Pt denies signs of infection. Pt has pain with movement. Pt is only 3 days out from surgery at this time. Pt denies NT.   PERTINENT HISTORY: N/A  PAIN:  Are you having pain? Yes: NPRS scale: 5/10 Pain location: side and at incision Pain description: burning, aching, spasm Aggravating factors: movement Relieving factors: rest  PRECAUTIONS: Other: Hip  WEIGHT BEARING RESTRICTIONS Yes TTWB for 2 wks post op  FALLS:  Has  patient fallen in last 6 months? 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 Small focal cartilage defect at the superolateral acetabulum of the left hip. 2. Smooth cleft of contrast at the anterior chondrolabral junction is favored to represent a normal sublabral sulcus. No evidence of a labral tear. 3. Trace left peritrochanteric bursal fluid.  The cleft of contrast at the anterior chondrolabral junction of the left hip described in impression #2 may represent a normal sublabral sulcus. A partially detached labral tear could have a similar appearance in the appropriate clinical setting. Correlate with exam.    PATIENT SURVEYS:  FOTO 11 60 @ DC 19 pts [facility]  TODAY'S TREATMENT:  PROM flexion to 90, ABD to 30, IR 10, ER 15   LAD wth oscillation grade III  Exercises - standing weight shift with brace and bilat crutches -standing march with brace and crutches -quadruped rocking 2x10 -supine bridge 2x10  Gait: 577ft bilat axillary crutches and brace (1 standing rest break needed); cuing given for step through pattern and heel toe pattern; safety and sequencing on stairs  PATIENT EDUCATION:  Education details: anatomy, exercise progression, DOMS expectations, muscle firing,  envelope of function, HEP, POC  Person educated: Patient Education method: Explanation, Demonstration, Tactile cues, Verbal cues, and Handouts Education comprehension: verbalized understanding, returned demonstration, verbal cues required, and tactile cues required   HOME EXERCISE PROGRAM: Access Code: [email or web address] URL: [email or web address] Date:  03/23/2022 Prepared by: Daleen Bo  ASSESSMENT:  CLINICAL IMPRESSION: Pt able to being WB on L LE as ordered by MD. Pt requires VC and TC for step length WB and upright posture with gait. Apprehension at first and lack of TKE but ipmroved with gait. Pt also able to demo safe step to pattern with stairs today with PWB status. Pt advised to not increase volume of walking suddenly now that she is cleared. Plan to continue with progression of gait, WB, and strength as per protocol. Pt is progressing very well for 2 wks post op. Pain is well managed today. Pt would benefit from continued skilled therapy in order to reach goals and maximize functional L LE strength and ROM for full return to PLOF.    OBJECTIVE IMPAIRMENTS Abnormal gait, decreased activity tolerance, decreased balance, decreased endurance, decreased knowledge of use of DME, decreased mobility, difficulty walking, decreased ROM, decreased strength, hypomobility, increased edema, increased fascial restrictions, increased muscle spasms, impaired flexibility, improper body mechanics, and  pain.   ACTIVITY LIMITATIONS carrying, lifting, sitting, standing, squatting, sleeping, stairs, transfers, bed mobility, bathing, toileting, dressing, locomotion level, and caring for others  PARTICIPATION LIMITATIONS: meal prep, cleaning, laundry, interpersonal relationship, driving, shopping, community activity, occupation, yard work, and exercise  PERSONAL FACTORS Time since onset of injury/illness/exacerbation and 1-2 comorbidities:    are also affecting patient's functional outcome.   REHAB POTENTIAL: Good  CLINICAL DECISION MAKING: Stable/uncomplicated  EVALUATION COMPLEXITY: Low   GOALS:   SHORT TERM GOALS: Target date: 05/18/2022  Pt will become independent with HEP in order to demonstrate synthesis of PT education.   Goal status: INITIAL  2.  Pt will be able to demonstrate normal gait with no AD and hip brace in order to demonstrate  functional improvement in LE function for self-care and house hold duties.   Goal status: INITIAL  3.  Pt will be able to demonstrate reciprocal stair step pattern with single UE support in order to demonstrate functional improvement in LE function for self-care and house hold duties.   Goal status: INITIAL   LONG TERM GOALS: Target date: 06/29/2022   Pt  will become independent with final HEP in order to demonstrate synthesis of PT education.  Goal status: INITIAL  2.  Pt will be able to demonstrate DL squat with >/=81EHU in order to demonstrate functional improvement in bilat LE strength for return to exercise.   Goal status: INITIAL  3.  Pt will be able to demonstrate full lunge on L LE in order to demonstrate functional improvement in LE function for improvement in L LE strength for return to exercise and PLOF. Goal status: INITIAL  4.  Pt will be able to demonstrate ability to agility ladder drills without pain in order to demonstrate functional improvement and tolerance to low level plyometric loading.   Goal status: INITIAL  5.  Pt will score >/= 60 on FOTO to demonstrate improvement in perceived L hip function.  Goal status: INITIAL   PLAN: PT FREQUENCY: 1-2x/week  PT DURATION: 12 weeks  PLANNED INTERVENTIONS: Therapeutic exercises, Therapeutic activity, Neuromuscular re-education, Balance training, Gait training, Patient/Family education, Self Care, Joint mobilization, Joint manipulation, Stair training, DME instructions, Aquatic Therapy, Dry Needling, Electrical stimulation, Wheelchair mobility training, Spinal manipulation, Spinal mobilization, Cryotherapy, Moist heat, scar mobilization, Splintting, Taping, Vasopneumatic device, Traction, Ultrasound, Ionotophoresis 4mg /ml Dexamethasone, Manual therapy, and Re-evaluation  PLAN FOR NEXT SESSION: PROM per protocol, clams and bridges as tolerated   , PT 04/06/2022, 9:26 AM

## 2022-04-12 ENCOUNTER — Encounter (HOSPITAL_BASED_OUTPATIENT_CLINIC_OR_DEPARTMENT_OTHER): Payer: Self-pay | Admitting: Physical Therapy

## 2022-04-12 ENCOUNTER — Ambulatory Visit (HOSPITAL_BASED_OUTPATIENT_CLINIC_OR_DEPARTMENT_OTHER): Payer: Commercial Managed Care - HMO | Admitting: Physical Therapy

## 2022-04-12 DIAGNOSIS — R600 Localized edema: Secondary | ICD-10-CM

## 2022-04-12 DIAGNOSIS — S73192A Other sprain of left hip, initial encounter: Secondary | ICD-10-CM | POA: Diagnosis not present

## 2022-04-12 DIAGNOSIS — M25552 Pain in left hip: Secondary | ICD-10-CM

## 2022-04-12 DIAGNOSIS — R262 Difficulty in walking, not elsewhere classified: Secondary | ICD-10-CM

## 2022-04-12 DIAGNOSIS — M6281 Muscle weakness (generalized): Secondary | ICD-10-CM

## 2022-04-12 NOTE — Therapy (Signed)
OUTPATIENT PHYSICAL THERAPY LOWER EXTREMITY TREATMENT   Patient Name: Maria Smith MRN: [id number] DOB:May 30, 1996, 26 y.o., female Today's Date: 04/12/2022   PT End of Session - 04/12/22 0810     Visit Number 4    Number of Visits 30    Date for PT Re-Evaluation 06/21/22    Authorization Type [employer]    PT Start Time 0802    PT Stop Time 0840    PT Time Calculation (min) 38 min    Activity Tolerance Patient tolerated treatment well    Behavior During Therapy [facility] for tasks assessed/performed                Past Medical History:  Diagnosis Date   Allergic rhinitis    Allergy    SEASONAL   Asthma    Complication of anesthesia    Low BP   Eczema    Environmental allergies    Age 26 years   Family history of adverse reaction to anesthesia    Mother went into coma after anesthesia. 4 days to wake up   Fatty liver 12/11/2018   Noted on CT 6/20   Generalized headaches    Migraines. 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 Rifampin since 08/2015   Streptococcal infection(041.00)    Age 15 and 26 years old   TB lung, latent    2017   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/2018   Diffuse enlargement on [place] 6/20   Past Surgical History:  Procedure Laterality Date   ESOPHAGOGASTRODUODENOSCOPY ENDOSCOPY  2021   HIP ARTHROSCOPY Left 03/20/2022   Procedure: LEFT HIP ARTHROSCOPY WITH LABRAL REPAIR;  Surgeon: Huel Cote, MD;  Location: [facility] OR;  Service: Orthopedics;  Laterality: Left;   TONSILLECTOMY  06/18/2010   WISDOM TOOTH EXTRACTION     Patient Active Problem List   Diagnosis Date Noted   Tear of left acetabular labrum    Pain in left hip 01/27/2021   Bruising 01/27/2021   Left-sided chest wall pain 03/23/2020   Positive ANA (antinuclear antibody) 03/23/2020   Asthma 02/10/2020   Allergic rhinitis 02/10/2020   GERD (gastroesophageal reflux disease) 02/10/2020   History of TB (tuberculosis)  08/21/2019   Throat pain 01/28/2019   Odynophagia 01/28/2019   Neck pain 01/28/2019   Thyroiditis 01/12/2019   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/2018   Fatty liver 12/11/2018   PCOS (polycystic ovarian syndrome) 11/11/2018   Seasonal allergies 09/22/2018   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   Migraine with aura and without status migrainosus, not intractable 09/11/2012   Variants of migraine, not elsewhere classified, without mention of intractable migraine without mention of status migrainosus 09/11/2012   Unspecified constipation 09/11/2012   Syncope 08/31/2010   Neck Pain 08/31/2010   Insomnia 08/31/2010    PCP:   Deeann Saint, MD     REFERRING PROVIDER: Huel Cote, MD   REFERRING DIAG: [phone] (ICD-10-CM) - Tear of left acetabular labrum, initial encounter   THERAPY DIAG:  Pain in left hip  Muscle weakness (generalized)  Difficulty walking  Localized edema  Rationale for Evaluation and Treatment Rehabilitation  ONSET DATE: 03/20/22    1.  Left hip labral repair 2.  Left hip pincer debridement   Days since surgery: 23   SUBJECTIVE:   SUBJECTIVE STATEMENT:  Pt states that her back and hip are sore but the walking has felt good. She states the R hip is  sore. Pt is 3 wks now. She has tried single crutch walking for short distances.    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 Works for [employer] at [employer].   Pt states since surgery she has a lot of pain and has difficulty with the hip. Unsure of if brace on properly. Pt denies signs of infection. Pt has pain with movement. Pt is only 3 days out from surgery at this time. Pt denies NT.   PERTINENT HISTORY: N/A  PAIN:  Are you having pain? Yes: NPRS scale: 4/10 Pain location: side and at incision Pain description: burning, aching, spasm Aggravating factors: movement Relieving factors: rest  PRECAUTIONS: Other: Hip  WEIGHT BEARING RESTRICTIONS Yes TTWB for 2 wks post  op  FALLS:  Has patient fallen in last 6 months? 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 Small focal cartilage defect at the superolateral acetabulum of the left hip. 2. Smooth cleft of contrast at the anterior chondrolabral junction is favored to represent a normal sublabral sulcus. No evidence of a labral tear. 3. Trace left peritrochanteric bursal fluid.  The cleft of contrast at the anterior chondrolabral junction of the left hip described in impression #2 may represent a normal sublabral sulcus. A partially detached labral tear could have a similar appearance in the appropriate clinical setting. Correlate with exam.    PATIENT SURVEYS:  FOTO 11 60 @ DC 19 pts MCII  TODAY'S TREATMENT:  Recumbent bike seat #2 Lvl 1; 5 min; able to go past 90   LAD wth oscillation grade III  Exercises -SLR 2x8 -YTB clamshell 2x10 STS 3x5 -supine bridge 2x10 -prone quad stretch 30s 3x -LTR 3s 10x  Gait: 136ft single axillary crutches and brace; cuing given for increased stance time, progressing towards full hip extension, and full toe off; 173ft no crutch with hip brace same cuing  PATIENT EDUCATION:  Education details: anatomy, exercise progression, DOMS expectations, muscle firing,  envelope of function, HEP, POC  Person educated: Patient Education method: Explanation, Demonstration, Tactile cues, Verbal cues, and Handouts Education comprehension: verbalized understanding, returned demonstration, verbal cues required, and tactile cues required   HOME EXERCISE PROGRAM: Access Code: [email or web address] URL: [email or web address] Date:  03/23/2022 Prepared by: Zebedee Iba  ASSESSMENT:  CLINICAL IMPRESSION: Pt with continued well managed pain. Pt able to continue with progressive WB at today's session. Pt does guard into hip flexion as well as have anterior hip soft tissue restriction that limits full stance phase in gait. Pt given verbal cuing for terminal stance position. Pt at 3 wks and able to progress ROM and strengthening. HEP updated today. Pt advised to walk with single crutch for long distances as she still has antalgic gait pattern without. Short distances inside are safe to do with brace and single crutch. Pt would benefit from continued skilled therapy in order to reach goals and maximize functional L LE strength and ROM for full return to PLOF.    OBJECTIVE IMPAIRMENTS Abnormal gait, decreased activity tolerance, decreased balance, decreased endurance, decreased knowledge of use of DME, decreased mobility, difficulty walking, decreased ROM, decreased strength, hypomobility, increased edema, increased fascial restrictions, increased  muscle spasms, impaired flexibility, improper body mechanics, and pain.   ACTIVITY LIMITATIONS carrying, lifting, sitting, standing, squatting, sleeping, stairs, transfers, bed mobility, bathing, toileting, dressing, locomotion level, and caring for others  PARTICIPATION LIMITATIONS: meal prep, cleaning, laundry, interpersonal relationship, driving, shopping, community activity, occupation, yard work, and exercise  PERSONAL FACTORS Time since onset of injury/illness/exacerbation and 1-2 comorbidities:    are also affecting patient's functional outcome.   REHAB POTENTIAL: Good  CLINICAL DECISION MAKING: Stable/uncomplicated  EVALUATION COMPLEXITY: Low   GOALS:   SHORT TERM GOALS: Target date: 05/24/2022  Pt will become independent with HEP in order to demonstrate synthesis of PT education.   Goal status: INITIAL  2.  Pt will be able to demonstrate normal gait with no AD and hip  brace in order to demonstrate functional improvement in LE function for self-care and house hold duties.   Goal status: INITIAL  3.  Pt will be able to demonstrate reciprocal stair step pattern with single UE support in order to demonstrate functional improvement in LE function for self-care and house hold duties.   Goal status: INITIAL   LONG TERM GOALS: Target date: 07/05/2022   Pt  will become independent with final HEP in order to demonstrate synthesis of PT education.  Goal status: INITIAL  2.  Pt will be able to demonstrate DL squat with >/=20lbs in order to demonstrate functional improvement in bilat LE strength for return to exercise.   Goal status: INITIAL  3.  Pt will be able to demonstrate full lunge on L LE in order to demonstrate functional improvement in LE function for improvement in L LE strength for return to exercise and PLOF. Goal status: INITIAL  4.  Pt will be able to demonstrate ability to agility ladder drills without pain in order to demonstrate functional improvement and tolerance to low level plyometric loading.   Goal status: INITIAL  5.  Pt will score >/= 60 on FOTO to demonstrate improvement in perceived L hip function.  Goal status: INITIAL   PLAN: PT FREQUENCY: 1-2x/week  PT DURATION: 12 weeks  PLANNED INTERVENTIONS: Therapeutic exercises, Therapeutic activity, Neuromuscular re-education, Balance training, Gait training, Patient/Family education, Self Care, Joint mobilization, Joint manipulation, Stair training, DME instructions, Aquatic Therapy, Dry Needling, Electrical stimulation, Wheelchair mobility training, Spinal manipulation, Spinal mobilization, Cryotherapy, Moist heat, scar mobilization, Splintting, Taping, Vasopneumatic device, Traction, Ultrasound, Ionotophoresis 4mg /ml Dexamethasone, Manual therapy, and Re-evaluation  PLAN FOR NEXT SESSION: PROM per protocol, clams and bridges as tolerated   Daleen Bo, PT 04/12/2022, 8:53 AM

## 2022-04-13 ENCOUNTER — Encounter (HOSPITAL_BASED_OUTPATIENT_CLINIC_OR_DEPARTMENT_OTHER): Payer: Commercial Managed Care - HMO | Admitting: Physical Therapy

## 2022-04-16 ENCOUNTER — Encounter: Payer: Commercial Managed Care - HMO | Admitting: Family Medicine

## 2022-04-17 ENCOUNTER — Encounter (HOSPITAL_BASED_OUTPATIENT_CLINIC_OR_DEPARTMENT_OTHER): Payer: Self-pay | Admitting: Physical Therapy

## 2022-04-17 ENCOUNTER — Ambulatory Visit (HOSPITAL_BASED_OUTPATIENT_CLINIC_OR_DEPARTMENT_OTHER): Payer: Commercial Managed Care - HMO | Admitting: Physical Therapy

## 2022-04-17 DIAGNOSIS — M6281 Muscle weakness (generalized): Secondary | ICD-10-CM

## 2022-04-17 DIAGNOSIS — R262 Difficulty in walking, not elsewhere classified: Secondary | ICD-10-CM

## 2022-04-17 DIAGNOSIS — M25552 Pain in left hip: Secondary | ICD-10-CM

## 2022-04-17 DIAGNOSIS — S73192A Other sprain of left hip, initial encounter: Secondary | ICD-10-CM | POA: Diagnosis not present

## 2022-04-17 NOTE — Therapy (Signed)
OUTPATIENT PHYSICAL THERAPY LOWER EXTREMITY TREATMENT   Patient Name: Maria Smith MRN: [id number] DOB:05/01/1996, 25 y.o., female Today's Date: 04/17/2022   PT End of Session - 04/17/22 0902     Visit Number 5    Number of Visits 30    Date for PT Re-Evaluation 06/21/22    Authorization Type [employer]    PT Start Time 0901    PT Stop Time 0940    PT Time Calculation (min) 39 min    Activity Tolerance Patient tolerated treatment well    Behavior During Therapy [facility] for tasks assessed/performed                Past Medical History:  Diagnosis Date   Allergic rhinitis    Allergy    SEASONAL   Asthma    Complication of anesthesia    Low BP   Eczema    Environmental allergies    Age 46 years   Family history of adverse reaction to anesthesia    Mother went into coma after anesthesia. 4 days to wake up   Fatty liver 12/11/2018   Noted on CT 6/20   Generalized headaches    Migraines. 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 Rifampin since 08/2015   Streptococcal infection(041.00)    Age 71 and 26 years old   TB lung, latent    2017   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/2018   Diffuse enlargement on [place] 6/20   Past Surgical History:  Procedure Laterality Date   ESOPHAGOGASTRODUODENOSCOPY ENDOSCOPY  2021   HIP ARTHROSCOPY Left 03/20/2022   Procedure: LEFT HIP ARTHROSCOPY WITH LABRAL REPAIR;  Surgeon: Huel Cote, MD;  Location: [facility] OR;  Service: Orthopedics;  Laterality: Left;   TONSILLECTOMY  06/18/2010   WISDOM TOOTH EXTRACTION     Patient Active Problem List   Diagnosis Date Noted   Tear of left acetabular labrum    Pain in left hip 01/27/2021   Bruising 01/27/2021   Left-sided chest wall pain 03/23/2020   Positive ANA (antinuclear antibody) 03/23/2020   Asthma 02/10/2020   Allergic rhinitis 02/10/2020   GERD (gastroesophageal reflux disease) 02/10/2020   History of TB (tuberculosis)  08/21/2019   Throat pain 01/28/2019   Odynophagia 01/28/2019   Neck pain 01/28/2019   Thyroiditis 01/12/2019   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/2018   Fatty liver 12/11/2018   PCOS (polycystic ovarian syndrome) 11/11/2018   Seasonal allergies 09/22/2018   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   Migraine with aura and without status migrainosus, not intractable 09/11/2012   Variants of migraine, not elsewhere classified, without mention of intractable migraine without mention of status migrainosus 09/11/2012   Unspecified constipation 09/11/2012   Syncope 08/31/2010   Neck Pain 08/31/2010   Insomnia 08/31/2010    PCP:   Deeann Saint, MD     REFERRING PROVIDER: Huel Cote, MD   REFERRING DIAG: [phone] (ICD-10-CM) - Tear of left acetabular labrum, initial encounter   THERAPY DIAG:  Pain in left hip  Muscle weakness (generalized)  Difficulty walking  Rationale for Evaluation and Treatment Rehabilitation  ONSET DATE: 03/20/22    1.  Left hip labral repair 2.  Left hip pincer debridement   Days since surgery: 28   SUBJECTIVE:   SUBJECTIVE STATEMENT:  Pt reports she is not sleeping in brace and is walking in house without crutch. She reports increased pressure in Lt buttocks when she  puts weight into LLE.     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 Works for [employer] at [employer].   Pt states since surgery she has a lot of pain and has difficulty with the hip. Unsure of if brace on properly. Pt denies signs of infection. Pt has pain with movement. Pt is only 3 days out from surgery at this time. Pt denies NT.   PERTINENT HISTORY: N/A  PAIN:  Are you having pain? Yes: NPRS scale: 6/10 Pain location: side and at incision Pain description: discomfort, achy Aggravating factors: movement Relieving factors: rest  PRECAUTIONS: Other: Hip  WEIGHT BEARING RESTRICTIONS Yes TTWB for 2 wks post op  FALLS:  Has patient fallen in last 6  months? 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 Small focal cartilage defect at the superolateral acetabulum of the left hip. 2. Smooth cleft of contrast at the anterior chondrolabral junction is favored to represent a normal sublabral sulcus. No evidence of a labral tear. 3. Trace left peritrochanteric bursal fluid.  The cleft of contrast at the anterior chondrolabral junction of the left hip described in impression #2 may represent a normal sublabral sulcus. A partially detached labral tear could have a similar appearance in the appropriate clinical setting. Correlate with exam.    PATIENT SURVEYS:  FOTO 11 60 @ DC 19 pts MCII  TODAY'S TREATMENT: Pt seen for aquatic therapy today.  Treatment took place in water 3.25-4.5 ft in depth at the [place]. Temp of water was 92.  Pt entered/exited the pool via stairs with step-to pattern independently with bilat rail.  * intro to [employer] * holding wall:  weight shifts with straight knees (side to side); heel raises;  squats  * holding white barbell:  forward gait (cues for even step length and to relax Lt foot); backward gait; side stepping with varied step height * Holding wall: small outward hip circles each LE * alternating toe taps to 1st step, no UE support * Lt forward step ups on bottom step with BUE on rails x 10 * Holding wall:  L SLS (80% submerged) with intermittent UE support  * bilat calf stretch with heels off of step  Pt requires the buoyancy and hydrostatic pressure of water for support, and to offload joints by unweighting joint load by at least 50 % in navel deep  water and by at least 75-80% in chest to neck deep water.  Viscosity of the water is needed for resistance of strengthening. Water current perturbations provides challenge to standing balance requiring increased core activation.     PATIENT EDUCATION:  Education details: intro to [employer] Person educated: Patient Education method: Consulting civil engineer, Media planner, Corporate treasurer cues, Verbal cues, and Handouts Education comprehension: verbalized understanding, returned demonstration, verbal cues required, and tactile cues required   HOME EXERCISE PROGRAM: Access Code: [email or web address] URL: [email or web address] Date: 03/23/2022 Prepared by: Daleen Bo  ASSESSMENT:  CLINICAL IMPRESSION: Pt  reported reduction of pain/tightness in Lt hip during session.  She is observed with improved gait quality after exiting water. She required minor cues for posture and step length during session. Tolerated aquatic exercises  well. She does not know how to swim but was confident in water when holding onto floatation device; able to take direction from therapist on deck.  Pt would benefit from continued skilled therapy in order to reach goals and maximize functional L LE strength and ROM for full return to PLOF.    OBJECTIVE IMPAIRMENTS Abnormal gait, decreased activity tolerance, decreased balance, decreased endurance, decreased knowledge of use of DME, decreased mobility, difficulty walking, decreased ROM, decreased strength, hypomobility, increased edema, increased fascial restrictions, increased muscle spasms, impaired flexibility, improper body mechanics, and pain.   ACTIVITY LIMITATIONS carrying, lifting, sitting, standing, squatting, sleeping, stairs, transfers, bed mobility, bathing, toileting, dressing, locomotion level, and caring for others  PARTICIPATION LIMITATIONS: meal prep, cleaning, laundry, interpersonal relationship, driving, shopping, community activity, occupation, yard work, and  exercise  PERSONAL FACTORS Time since onset of injury/illness/exacerbation and 1-2 comorbidities:    are also affecting patient's functional outcome.   REHAB POTENTIAL: Good  CLINICAL DECISION MAKING: Stable/uncomplicated  EVALUATION COMPLEXITY: Low   GOALS:   SHORT TERM GOALS: Target date: 05/04/22 Pt will become independent with HEP in order to demonstrate synthesis of PT education.   Goal status: INITIAL  2.  Pt will be able to demonstrate normal gait with no AD and hip brace in order to demonstrate functional improvement in LE function for self-care and house hold duties.   Goal status: INITIAL  3.  Pt will be able to demonstrate reciprocal stair step pattern with single UE support in order to demonstrate functional improvement in LE function for self-care and house hold duties.   Goal status: INITIAL   LONG TERM GOALS: Target date:06/15/22  Pt  will become independent with final HEP in order to demonstrate synthesis of PT education.  Goal status: INITIAL  2.  Pt will be able to demonstrate DL squat with >/=71IRC in order to demonstrate functional improvement in bilat LE strength for return to exercise.   Goal status: INITIAL  3.  Pt will be able to demonstrate full lunge on L LE in order to demonstrate functional improvement in LE function for improvement in L LE strength for return to exercise and PLOF. Goal status: INITIAL  4.  Pt will be able to demonstrate ability to agility ladder drills without pain in order to demonstrate functional improvement and tolerance to low level plyometric loading.   Goal status: INITIAL  5.  Pt will score >/= 60 on FOTO to demonstrate improvement in perceived L hip function.  Goal status: INITIAL   PLAN: PT FREQUENCY: 1-2x/week  PT DURATION: 12 weeks  PLANNED INTERVENTIONS: Therapeutic exercises, Therapeutic activity, Neuromuscular re-education, Balance training, Gait training, Patient/Family education, Self Care, Joint  mobilization, Joint manipulation, Stair training, DME instructions, Aquatic Therapy, Dry Needling, Electrical stimulation, Wheelchair mobility training, Spinal manipulation, Spinal mobilization, Cryotherapy, Moist heat, scar mobilization, Splintting, Taping, Vasopneumatic device, Traction, Ultrasound, Ionotophoresis 4mg /ml Dexamethasone, Manual therapy, and Re-evaluation  PLAN FOR NEXT SESSION: Progress per protocol, clams and bridges as tolerated  , PTA 04/17/22 9:44 AM [facility] [address] [place], [place], [place] Phone: [phone]   Fax:  [phone]

## 2022-04-18 ENCOUNTER — Ambulatory Visit (HOSPITAL_COMMUNITY)
Admission: EM | Admit: 2022-04-18 | Discharge: 2022-04-18 | Disposition: A | Discharge status: Home / Self Care | Payer: Commercial Managed Care - HMO | Attending: Internal Medicine | Admitting: Internal Medicine

## 2022-04-18 ENCOUNTER — Encounter (HOSPITAL_COMMUNITY): Payer: Self-pay

## 2022-04-18 DIAGNOSIS — R300 Dysuria: Secondary | ICD-10-CM | POA: Diagnosis not present

## 2022-04-18 DIAGNOSIS — R350 Frequency of micturition: Secondary | ICD-10-CM | POA: Insufficient documentation

## 2022-04-18 LAB — POCT URINALYSIS DIPSTICK, ED / UC
Bilirubin Urine: NEGATIVE
Glucose, UA: NEGATIVE mg/dL
Hgb urine dipstick: NEGATIVE
Ketones, ur: NEGATIVE mg/dL
Nitrite: NEGATIVE
Protein, ur: NEGATIVE mg/dL
Specific Gravity, Urine: 1.01 (ref 1.005–1.030)
Urobilinogen, UA: 0.2 mg/dL (ref 0.0–1.0)
pH: 7 (ref 5.0–8.0)

## 2022-04-18 NOTE — ED Triage Notes (Signed)
Pt states she has been having urine frquency a, urgency and pressure when urinating x1wk

## 2022-04-18 NOTE — Discharge Instructions (Signed)
Your urine culture is pending and we will call you if your urine grows any bacteria.   Stay away from sugary and caffeinated beverages as these are known to irritate the urinary tract.   Continue taking dulcolax for constipation and continue drinking plenty of water to stay well hydrated.  Return to urgent care as needed.

## 2022-04-18 NOTE — ED Provider Notes (Addendum)
[place]    CSN: [id number] Arrival date & time: 04/18/22  7408      History   Chief Complaint Chief Complaint  Patient presents with   Dysuria    HPI Maria Smith is a 26 y.o. female.   Patient presents to urgent care for evaluation of dysuria, urinary frequency, and suprapubic pressure for the last week. She recently had surgery to the left hip and is in physical therapy for this. She is having a difficult time differentiating her suprapubic discomfort from groin pain associated with recent hip surgery. The development of slight dysuria and urinary frequency caused her to become concerned about a possible developing UTI. Denies recent fever/chills, nausea, vomiting, back/flank pan, diarrhea, and dizziness. She has been experiencing some constipation due to recent frequent use of narcotic pain medicine post-op but has been taking dulcolax to help with this. Last BM was this morning and normal without blood/mucous. No recent new sexual partners or concern for possible STD. She has not attempted use of any over the counter medications for symptoms prior to arrival at urgent care.   Dysuria   Past Medical History:  Diagnosis Date   Allergic rhinitis    Allergy    SEASONAL   Asthma    Complication of anesthesia    Low BP   Eczema    Environmental allergies    Age 68 years   Family history of adverse reaction to anesthesia    Mother went into coma after anesthesia. 4 days to wake up   Fatty liver 12/11/2018   Noted on CT 6/20   Generalized headaches    Migraines. 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 Rifampin since 08/2015   Streptococcal infection(041.00)    Age 46 and 26 years old   TB lung, latent    2017   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/2018   Diffuse enlargement on [place] 6/20    Patient Active Problem List   Diagnosis Date Noted   Tear of left acetabular labrum    Pain in  left hip 01/27/2021   Bruising 01/27/2021   Left-sided chest wall pain 03/23/2020   Positive ANA (antinuclear antibody) 03/23/2020   Asthma 02/10/2020   Allergic rhinitis 02/10/2020   GERD (gastroesophageal reflux disease) 02/10/2020   History of TB (tuberculosis) 08/21/2019   Throat pain 01/28/2019   Odynophagia 01/28/2019   Neck pain 01/28/2019   Thyroiditis 01/12/2019   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/2018   Fatty liver 12/11/2018   PCOS (polycystic ovarian syndrome) 11/11/2018   Seasonal allergies 09/22/2018   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   Migraine with aura and without status migrainosus, not intractable 09/11/2012   Variants of migraine, not elsewhere classified, without mention of intractable migraine without mention of status migrainosus 09/11/2012   Unspecified constipation 09/11/2012   Syncope 08/31/2010   Neck Pain 08/31/2010   Insomnia 08/31/2010    Past Surgical History:  Procedure Laterality Date   ESOPHAGOGASTRODUODENOSCOPY ENDOSCOPY  2021   HIP ARTHROSCOPY Left 03/20/2022   Procedure: LEFT HIP ARTHROSCOPY WITH LABRAL REPAIR;  Surgeon: Vanetta Mulders, MD;  Location: [facility];  Service: Orthopedics;  Laterality: Left;   TONSILLECTOMY  06/18/2010   WISDOM TOOTH EXTRACTION      OB History     Gravida  1   Para  0   Term  0   Preterm  0   AB  0  Living  0      SAB  0   IAB  0   Ectopic  0   Multiple  0   Live Births  0            Home Medications    Prior to Admission medications   Medication Sig Start Date End Date Taking? Authorizing Provider  acetaminophen (TYLENOL CHILDRENS) 160 MG/5ML suspension Take 31.3 mLs (1,000 mg total) by mouth every 8 (eight) hours as needed. Patient not taking: Reported on 02/27/2022 02/11/22   Raspet, Junie Panning K, PA-C  albuterol (VENTOLIN HFA) 108 (90 Base) MCG/ACT inhaler Inhale 2 puffs into the lungs every 6 (six) hours as needed for wheezing or shortness of breath. 03/27/22   [place], Betty G, MD  aspirin EC 325 MG tablet  Take 1 tablet (325 mg total) by mouth daily. 03/20/22   Vanetta Mulders, MD  cetirizine (ZYRTEC) 10 MG tablet Take 1 tablet (10 mg total) by mouth daily as needed for allergies. 03/27/22   [place], Betty G, MD  ferrous sulfate 325 (65 FE) MG tablet Take 325 mg by mouth every other day.    [provider]  fluticasone (FLONASE) 50 MCG/ACT nasal spray Place 2 sprays into both nostrils daily. 03/27/22   [place], Betty G, MD  HYDROcodone-acetaminophen (NORCO/VICODIN) 5-325 MG tablet Take 1 tablet by mouth every 6 (six) hours as needed for moderate pain. 03/22/22   Vanetta Mulders, MD  ibuprofen (ADVIL) 100 MG/5ML suspension Take 30 mLs (600 mg total) by mouth every 8 (eight) hours as needed. Patient not taking: Reported on 02/27/2022 02/11/22   Raspet, Junie Panning K, PA-C  influenza vac split quadrivalent PF (FLUARIX) 0.5 ML injection Inject into the muscle. 03/23/22   Carlyle Basques, MD  magic mouthwash (nystatin, lidocaine, diphenhydrAMINE) suspension Take 5 mLs by mouth 4 (four) times daily as needed for mouth pain. Swish and spit Patient not taking: Reported on 02/27/2022 02/11/22   Raspet, Junie Panning K, PA-C  Olopatadine HCl 0.2 % SOLN INSTILL 1 DROP INTO AFFECTED EYE EVERY DAY Patient not taking: Reported on 02/27/2022 10/27/21   Billie Ruddy, MD  Respiratory Therapy Supplies (NEBULIZER/TUBING/MOUTHPIECE) KIT Use as directed. 04/24/21   Billie Ruddy, MD  Vitamin D-Vitamin K (VITAMIN K2-VITAMIN D3 PO) Take 2 drops by mouth daily. Omega 3 2 Drops =5000 Units    [provider]  levocetirizine (XYZAL) 5 MG tablet TAKE 1 TABLET BY MOUTH EVERY DAY IN THE EVENING 04/15/19 10/23/19  Billie Ruddy, MD    Family History Family History  Problem Relation Age of Onset   Migraines Paternal 45    Migraines Paternal 46    HIV Father    Diabetes Mother    Hypertension Mother    Anemia Mother    COPD Mother    CVA Mother    Congestive Heart Failure Mother    Cirrhosis Mother     Obesity Mother    Irritable bowel syndrome Mother    Rheum arthritis Mother    Ulcers Brother    Anemia Sister    Cholecystitis Brother    Migraines Paternal Aunt    Migraines Maternal Uncle        Maternal Great Uncle   Diabetes Other        Family History   Hypertension Other        Family History   Depression Other        Family History   Asthma Other  Family History   Allergic rhinitis Other        Family History   Asthma Brother    Colon cancer Neg Hx    Colon polyps Neg Hx    Esophageal cancer Neg Hx    Rectal cancer Neg Hx    Stomach cancer Neg Hx     Social History Social History   Tobacco Use   Smoking status: Never   Smokeless tobacco: Never  Vaping Use   Vaping Use: Never used  Substance Use Topics   Alcohol use: Not Currently   Drug use: No     Allergies   Patient has no known allergies.   Review of Systems Review of Systems  Genitourinary:  Positive for dysuria.  Per HPI   Physical Exam Triage Vital Signs ED Triage Vitals [04/18/22 0836]  Enc Vitals Group     BP 109/69     Pulse Rate 76     Resp 12     Temp 98.3 F (36.8 C)     Temp Source Oral     SpO2 98 %     Weight      Height      Head Circumference      Peak Flow      Pain Score 6     Pain Loc      Pain Edu?      Excl. in [place]?    No data found.  Updated Vital Signs BP 109/69 (BP Location: Right Arm)   Pulse 76   Temp 98.3 F (36.8 C) (Oral)   Resp 12   LMP  (LMP Unknown)   SpO2 98%   Visual Acuity Right Eye Distance:   Left Eye Distance:   Bilateral Distance:    Right Eye Near:   Left Eye Near:    Bilateral Near:     Physical Exam Vitals and nursing note reviewed.  Constitutional:      Appearance: She is not ill-appearing or toxic-appearing.  HENT:     Head: Normocephalic and atraumatic.     Right Ear: Hearing and external ear normal.     Left Ear: Hearing and external ear normal.     Nose: Nose normal.     Mouth/Throat:     Lips: Pink.      Mouth: Mucous membranes are moist.  Eyes:     General: Lids are normal. Vision grossly intact. Gaze aligned appropriately.     Extraocular Movements: Extraocular movements intact.     Conjunctiva/sclera: Conjunctivae normal.  Pulmonary:     Effort: Pulmonary effort is normal.  Abdominal:     General: Abdomen is flat.     Palpations: Abdomen is soft.     Tenderness: There is no abdominal tenderness. There is no right CVA tenderness or left CVA tenderness.  Musculoskeletal:     Cervical back: Neck supple.  Skin:    General: Skin is warm and dry.     Capillary Refill: Capillary refill takes less than 2 seconds.     Findings: No rash.  Neurological:     General: No focal deficit present.     Mental Status: She is alert and oriented to person, place, and time. Mental status is at baseline.     Cranial Nerves: No dysarthria or facial asymmetry.  Psychiatric:        Mood and Affect: Mood normal.        Speech: Speech normal.        Behavior: Behavior normal.  Thought Content: Thought content normal.        Judgment: Judgment normal.      UC Treatments / Results  Labs (all labs ordered are listed, but only abnormal results are displayed) Labs Reviewed  POCT URINALYSIS DIPSTICK, ED / UC - Abnormal; Notable for the following components:      Result Value   Leukocytes,Ua TRACE (*)    All other components within normal limits  URINE CULTURE    EKG   Radiology No results found.  Procedures Procedures (including critical care time)  Medications Ordered in UC Medications - No data to display  Initial Impression / Assessment and Plan / UC Course  I have reviewed the triage vital signs and the nursing notes.  Pertinent labs & imaging results that were available during my care of the patient were reviewed by me and considered in my medical decision making (see chart for details).   1. Dysuria and urinary frequency Urinalysis shows some white blood cells without obvious  signs of urinary tract infection. Urine culture is pending. Plan to treat for UTI based off of results of urine culture. Patient is agreeable with this. Recommend avoiding urinary irritants, increasing water intake to stay well-hydrated, and follow-up with PCP if problem persists.   *If urine culture comes back consistent with UTI, patient would like liquid antibiotic as she has a difficult time swallowing large pills*   Discussed physical exam and available lab work findings in clinic with patient.  Counseled patient regarding appropriate use of medications and potential side effects for all medications recommended or prescribed today. Discussed red flag signs and symptoms of worsening condition,when to call the PCP office, return to urgent care, and when to seek higher level of care in the emergency department. Patient verbalizes understanding and agreement with plan. All questions answered. Patient discharged in stable condition.    Final Clinical Impressions(s) / UC Diagnoses   Final diagnoses:  Dysuria  Urinary frequency     Discharge Instructions      Your urine culture is pending and we will call you if your urine grows any bacteria.   Stay away from sugary and caffeinated beverages as these are known to irritate the urinary tract.   Continue taking dulcolax for constipation and continue drinking plenty of water to stay well hydrated.  Return to urgent care as needed.    ED Prescriptions   None    PDMP not reviewed this encounter.   Talbot Grumbling, FNP 04/18/22 [place], [place], FNP 04/18/22 [phone]

## 2022-04-19 LAB — URINE CULTURE: Culture: <10000 — AB

## 2022-04-24 ENCOUNTER — Encounter (HOSPITAL_BASED_OUTPATIENT_CLINIC_OR_DEPARTMENT_OTHER): Payer: Self-pay | Admitting: Physical Therapy

## 2022-04-24 ENCOUNTER — Ambulatory Visit (HOSPITAL_BASED_OUTPATIENT_CLINIC_OR_DEPARTMENT_OTHER): Payer: Commercial Managed Care - HMO | Attending: Orthopaedic Surgery | Admitting: Physical Therapy

## 2022-04-24 DIAGNOSIS — M6281 Muscle weakness (generalized): Secondary | ICD-10-CM | POA: Insufficient documentation

## 2022-04-24 DIAGNOSIS — R600 Localized edema: Secondary | ICD-10-CM | POA: Diagnosis present

## 2022-04-24 DIAGNOSIS — M25552 Pain in left hip: Secondary | ICD-10-CM | POA: Insufficient documentation

## 2022-04-24 DIAGNOSIS — R262 Difficulty in walking, not elsewhere classified: Secondary | ICD-10-CM | POA: Insufficient documentation

## 2022-04-24 NOTE — Therapy (Signed)
OUTPATIENT PHYSICAL THERAPY LOWER EXTREMITY TREATMENT   Patient Name: Maria Smith MRN: [id number] DOB:April 12, 1996, 26 y.o., female Today's Date: 04/24/2022   PT End of Session - 04/24/22 0810     Visit Number 6    Number of Visits 30    Date for PT Re-Evaluation 06/21/22    Authorization Type [employer]    PT Start Time 0802    PT Stop Time 0835    PT Time Calculation (min) 33 min    Activity Tolerance Patient tolerated treatment well    Behavior During Therapy [facility] for tasks assessed/performed                 Past Medical History:  Diagnosis Date   Allergic rhinitis    Allergy    SEASONAL   Asthma    Complication of anesthesia    Low BP   Eczema    Environmental allergies    Age 20 years   Family history of adverse reaction to anesthesia    Mother went into coma after anesthesia. 4 days to wake up   Fatty liver 12/11/2018   Noted on CT 6/20   Generalized headaches    Migraines. 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 Rifampin since 08/2015   Streptococcal infection(041.00)    Age 72 and 26 years old   TB lung, latent    2017   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/2018   Diffuse enlargement on [place] 6/20   Past Surgical History:  Procedure Laterality Date   ESOPHAGOGASTRODUODENOSCOPY ENDOSCOPY  2021   HIP ARTHROSCOPY Left 03/20/2022   Procedure: LEFT HIP ARTHROSCOPY WITH LABRAL REPAIR;  Surgeon: Huel Cote, MD;  Location: [facility];  Service: Orthopedics;  Laterality: Left;   TONSILLECTOMY  06/18/2010   WISDOM TOOTH EXTRACTION     Patient Active Problem List   Diagnosis Date Noted   Tear of left acetabular labrum    Pain in left hip 01/27/2021   Bruising 01/27/2021   Left-sided chest wall pain 03/23/2020   Positive ANA (antinuclear antibody) 03/23/2020   Asthma 02/10/2020   Allergic rhinitis 02/10/2020   GERD (gastroesophageal reflux disease) 02/10/2020   History of TB (tuberculosis)  08/21/2019   Throat pain 01/28/2019   Odynophagia 01/28/2019   Neck pain 01/28/2019   Thyroiditis 01/12/2019   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/2018   Fatty liver 12/11/2018   PCOS (polycystic ovarian syndrome) 11/11/2018   Seasonal allergies 09/22/2018   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   Migraine with aura and without status migrainosus, not intractable 09/11/2012   Variants of migraine, not elsewhere classified, without mention of intractable migraine without mention of status migrainosus 09/11/2012   Unspecified constipation 09/11/2012   Syncope 08/31/2010   Neck Pain 08/31/2010   Insomnia 08/31/2010    PCP:   Deeann Saint, MD     REFERRING PROVIDER: Huel Cote, MD   REFERRING DIAG: [phone] (ICD-10-CM) - Tear of left acetabular labrum, initial encounter   THERAPY DIAG:  Pain in left hip  Difficulty walking  Muscle weakness (generalized)  Rationale for Evaluation and Treatment Rehabilitation  ONSET DATE: 03/20/22    1.  Left hip labral repair 2.  Left hip pincer debridement   Days since surgery: 35   SUBJECTIVE:   SUBJECTIVE STATEMENT:  Pt states she did well in the water. She did have a scab pull off the steri-strip but no issues otherwise. The hip  feels much better but is just achey and stiff.    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 Works for [employer] at [employer].   Pt states since surgery she has a lot of pain and has difficulty with the hip. Unsure of if brace on properly. Pt denies signs of infection. Pt has pain with movement. Pt is only 3 days out from surgery at this time. Pt denies NT.   PERTINENT HISTORY: N/A  PAIN:  Are you having pain? 0: NPRS scale: 0/10 Pain location: side and at incision Pain description: discomfort, achy Aggravating factors: movement Relieving factors: rest  PRECAUTIONS: Other: Hip  WEIGHT BEARING RESTRICTIONS Yes TTWB for 2 wks post op  FALLS:  Has patient fallen in last  6 months? 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 Small focal cartilage defect at the superolateral acetabulum of the left hip. 2. Smooth cleft of contrast at the anterior chondrolabral junction is favored to represent a normal sublabral sulcus. No evidence of a labral tear. 3. Trace left peritrochanteric bursal fluid.  The cleft of contrast at the anterior chondrolabral junction of the left hip described in impression #2 may represent a normal sublabral sulcus. A partially detached labral tear could have a similar appearance in the appropriate clinical setting. Correlate with exam.    PATIENT SURVEYS:  FOTO 11 60 @ DC 19 pts [facility]  TODAY'S TREATMENT:  11/7 Recumbent bike seat #2 Lvl 1; 5 min; able to go past 90  Exercises -standing hip flexor stretch 30s 3x -standing groin/adductor stretch 30s 3x -mini squat 2x10 -sidestepping RTB at knee 2x laps at bar -supine bridge with march 3x8 -figure 4 stretch 30s 3x -supine HS stretch 10s 5x -Child's pose, hips open position 10s 5x   Previous:   Pt seen for aquatic therapy today.  Treatment took place in water 3.25-4.5 ft in depth at the [place]. Temp of water was 92.  Pt entered/exited the pool via stairs with step-to pattern independently with bilat rail.  * intro to [employer] * holding wall:  weight shifts with straight knees (side to side); heel raises;  squats  * holding white barbell:  forward gait (cues for even step length and to relax Lt foot); backward gait; side stepping with varied step height * Holding wall: small outward hip circles each LE * alternating toe  taps to 1st step, no UE support * Lt forward step ups on bottom step with BUE on rails x 10 * Holding wall:  L SLS (80% submerged) with intermittent UE support  * bilat calf stretch with heels off of step  Pt requires the buoyancy and hydrostatic pressure of water for support, and to offload joints by unweighting joint load by at least 50 % in navel deep water and by at least 75-80% in chest to neck deep water.  Viscosity of the water is needed for resistance of strengthening. Water current perturbations provides challenge to standing balance requiring increased core activation.     PATIENT EDUCATION:  Education details: anatomy, exercise progression, AD/brace usage, muscle firing,  envelope of function, HEP, POC  Person educated:  Patient Education method: Explanation, Demonstration, Tactile cues, Verbal cues, and Handouts Education comprehension: verbalized understanding, returned demonstration, verbal cues required, and tactile cues required   HOME EXERCISE PROGRAM: Access Code: [email or web address] URL: [email or web address] Date: 03/23/2022 Prepared by: Zebedee Iba  ASSESSMENT:  CLINICAL IMPRESSION: Pt at 5 wks at this time and is able to progress with walking tolerance without AD as well as start more multiplanar hip stretching. Pt still with ext and rotational hip stiffness at today's session but improved following stretching. Pt does have expected muscle weakness and fatigue with unilateral strengthening and motor control but no pain noted during session. Pt advised to walk without AD at this time and wear brace at her discretion. Gait is safe and L hip strong enough to walk on level surfaces without brace. Plan to continue with strengthening and ROM as tolerated. Consider hip joint mobs at next if continues to be tight. Pt would benefit from continued skilled therapy in order to reach goals and maximize functional L LE strength and ROM for full return to PLOF.    OBJECTIVE  IMPAIRMENTS Abnormal gait, decreased activity tolerance, decreased balance, decreased endurance, decreased knowledge of use of DME, decreased mobility, difficulty walking, decreased ROM, decreased strength, hypomobility, increased edema, increased fascial restrictions, increased muscle spasms, impaired flexibility, improper body mechanics, and pain.   ACTIVITY LIMITATIONS carrying, lifting, sitting, standing, squatting, sleeping, stairs, transfers, bed mobility, bathing, toileting, dressing, locomotion level, and caring for others  PARTICIPATION LIMITATIONS: meal prep, cleaning, laundry, interpersonal relationship, driving, shopping, community activity, occupation, yard work, and exercise  PERSONAL FACTORS Time since onset of injury/illness/exacerbation and 1-2 comorbidities:    are also affecting patient's functional outcome.   REHAB POTENTIAL: Good  CLINICAL DECISION MAKING: Stable/uncomplicated  EVALUATION COMPLEXITY: Low   GOALS:   SHORT TERM GOALS: Target date: 05/04/22 Pt will become independent with HEP in order to demonstrate synthesis of PT education.   Goal status: INITIAL  2.  Pt will be able to demonstrate normal gait with no AD and hip brace in order to demonstrate functional improvement in LE function for self-care and house hold duties.   Goal status: INITIAL  3.  Pt will be able to demonstrate reciprocal stair step pattern with single UE support in order to demonstrate functional improvement in LE function for self-care and house hold duties.   Goal status: INITIAL   LONG TERM GOALS: Target date:06/15/22  Pt  will become independent with final HEP in order to demonstrate synthesis of PT education.  Goal status: INITIAL  2.  Pt will be able to demonstrate DL squat with >/=47MLY in order to demonstrate functional improvement in bilat LE strength for return to exercise.   Goal status: INITIAL  3.  Pt will be able to demonstrate full lunge on L LE in order to  demonstrate functional improvement in LE function for improvement in L LE strength for return to exercise and PLOF. Goal status: INITIAL  4.  Pt will be able to demonstrate ability to agility ladder drills without pain in order to demonstrate functional improvement and tolerance to low level plyometric loading.   Goal status: INITIAL  5.  Pt will score >/= 60 on FOTO to demonstrate improvement in perceived L hip function.  Goal status: INITIAL   PLAN: PT FREQUENCY: 1-2x/week  PT DURATION: 12 weeks  PLANNED INTERVENTIONS: Therapeutic exercises, Therapeutic activity, Neuromuscular re-education, Balance training, Gait training, Patient/Family education, Self Care, Joint mobilization, Joint manipulation, Stair training, DME instructions, Aquatic Therapy, Dry Needling, Electrical  stimulation, Wheelchair mobility training, Spinal manipulation, Spinal mobilization, Cryotherapy, Moist heat, scar mobilization, Splintting, Taping, Vasopneumatic device, Traction, Ultrasound, Ionotophoresis 4mg /ml Dexamethasone, Manual therapy, and Re-evaluation  PLAN FOR NEXT SESSION: Progress per protocol, clams and bridges as tolerated  PT, DPT 04/24/22 8:43 AM

## 2022-04-26 ENCOUNTER — Encounter (HOSPITAL_BASED_OUTPATIENT_CLINIC_OR_DEPARTMENT_OTHER): Payer: Commercial Managed Care - HMO | Admitting: Physical Therapy

## 2022-04-26 ENCOUNTER — Ambulatory Visit (HOSPITAL_BASED_OUTPATIENT_CLINIC_OR_DEPARTMENT_OTHER): Payer: Self-pay | Admitting: Physical Therapy

## 2022-04-30 ENCOUNTER — Ambulatory Visit (INDEPENDENT_AMBULATORY_CARE_PROVIDER_SITE_OTHER): Payer: Commercial Managed Care - HMO | Admitting: Orthopaedic Surgery

## 2022-04-30 DIAGNOSIS — S73192A Other sprain of left hip, initial encounter: Secondary | ICD-10-CM

## 2022-04-30 NOTE — Progress Notes (Signed)
Post Operative Evaluation    Procedure/Date of Surgery: Left hip arthroscopy with labral repair 03/20/2022  Interval History:   Presents today 6 weeks status post left hip labral repair overall doing very well.  At this time she has had some pressure in the hip but overall feels much better.  There is no popping or catching.  PMH/PSH/Family History/Social History/Meds/Allergies:    Past Medical History:  Diagnosis Date   Allergic rhinitis    Allergy    SEASONAL   Asthma    Complication of anesthesia    Low BP   Eczema    Environmental allergies    Age 26 years   Family history of adverse reaction to anesthesia    Mother went into coma after anesthesia. 4 days to wake up   Fatty liver 12/11/2018   Noted on CT 6/20   Generalized headaches    Migraines. 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 08/2015   Streptococcal infection(041.00)    Age 35 and 26 years old   TB lung, latent    2017   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/2018   Diffuse enlargement on [place] 6/20   Past Surgical History:  Procedure Laterality Date   ESOPHAGOGASTRODUODENOSCOPY ENDOSCOPY  2021   HIP ARTHROSCOPY Left 03/20/2022   Procedure: LEFT HIP ARTHROSCOPY WITH LABRAL REPAIR;  Surgeon: Vanetta Mulders, MD;  Location: [facility];  Service: Orthopedics;  Laterality: Left;   TONSILLECTOMY  06/18/2010   WISDOM TOOTH EXTRACTION     Social History   Socioeconomic History   Marital status: Married    Spouse name: Not on file   Number of children: 2   Years of education: Not on file   Highest education level: Bachelor's degree (e.g., BA, AB, BS)  Occupational History   Occupation: dietary  Tobacco Use   Smoking status: Never   Smokeless tobacco: Never  Vaping Use   Vaping Use: Never used  Substance and Sexual Activity   Alcohol use: Not Currently   Drug use: No   Sexual activity: Yes     Partners: Female    Comment: female partner  Other Topics Concern   Not on file  Social History Narrative   Patient works at [employer]   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    Social Determinants of Health   Financial Resource Strain: Low Risk  (08/29/2021)   Overall Financial Resource Strain (CARDIA)    Difficulty of Paying Living Expenses: Not hard at all  Food Insecurity: No Food Insecurity (08/29/2021)   Hunger Vital Sign    Worried About Running Out of Food in the Last Year: Never true    Ran Out of Food in the Last Year: Never true  Transportation Needs: No Transportation Needs (08/29/2021)   PRAPARE - Hydrologist (Medical): No    Lack of Transportation (Non-Medical): No  Physical Activity: Insufficiently Active (08/29/2021)   Exercise Vital Sign    Days of Exercise per Week: 2 days    Minutes of Exercise per Session: 30 min  Stress: No Stress Concern Present (08/29/2021)   [place]    Feeling of Stress : Not at all  Social Connections: Unknown (08/29/2021)   Social Connection and Isolation Panel [NHANES]    Frequency of Communication with Friends and Family: More than three times a week    Frequency of Social Gatherings with Friends and Family: Patient refused    Attends Religious Services: Patient refused    Marine scientist or Organizations: No    Attends Music therapist: Not on file    Marital Status: Married   Family History  Problem Relation Age of Onset   Migraines Paternal Grandmother    Migraines Paternal Grandfather    HIV Father    Diabetes Mother    Hypertension Mother    Anemia Mother    COPD Mother    CVA Mother    Congestive Heart Failure Mother    Cirrhosis Mother    Obesity Mother    Irritable bowel syndrome Mother    Rheum  arthritis Mother    Ulcers Brother    Anemia Sister    Cholecystitis Brother    Migraines Paternal Aunt    Migraines Maternal Uncle        Maternal Great Uncle   Diabetes Other        Family History   Hypertension Other        Family History   Depression Other        Family History   Asthma Other        Family History   Allergic rhinitis Other        Family History   Asthma Brother    Colon cancer Neg Hx    Colon polyps Neg Hx    Esophageal cancer Neg Hx    Rectal cancer Neg Hx    Stomach cancer Neg Hx    No Known Allergies Current Outpatient Medications  Medication Sig Dispense Refill   acetaminophen (TYLENOL CHILDRENS) 160 MG/5ML suspension Take 31.3 mLs (1,000 mg total) by mouth every 8 (eight) hours as needed. (Patient not taking: Reported on 02/27/2022) 500 mL 0   albuterol (VENTOLIN HFA) 108 (90 Base) MCG/ACT inhaler Inhale 2 puffs into the lungs every 6 (six) hours as needed for wheezing or shortness of breath. 8 g 2   aspirin EC 325 MG tablet Take 1 tablet (325 mg total) by mouth daily. 30 tablet 0   cetirizine (ZYRTEC) 10 MG tablet Take 1 tablet (10 mg total) by mouth daily as needed for allergies. 90 tablet 3   ferrous sulfate 325 (65 FE) MG tablet Take 325 mg by mouth every other day.     fluticasone (FLONASE) 50 MCG/ACT nasal spray Place 2 sprays into both nostrils daily. 16 g 1   HYDROcodone-acetaminophen (NORCO/VICODIN) 5-325 MG tablet Take 1 tablet by mouth every 6 (six) hours as needed for moderate pain. 30 tablet 0   ibuprofen (ADVIL) 100 MG/5ML suspension Take 30 mLs (600 mg total) by mouth every 8 (eight) hours as needed. (Patient not taking: Reported on 02/27/2022) 450 mL 0   influenza vac split quadrivalent PF (FLUARIX) 0.5 ML injection Inject into the muscle. 0.5 mL 0   magic mouthwash (nystatin, lidocaine, diphenhydrAMINE) suspension Take 5 mLs by mouth 4 (four) times daily as needed for mouth pain. Swish and spit (Patient not taking: Reported on 02/27/2022)  180 mL 0   Olopatadine HCl 0.2 % SOLN INSTILL 1 DROP  INTO AFFECTED EYE EVERY DAY (Patient not taking: Reported on 02/27/2022) 7.5 mL 1   Respiratory Therapy Supplies (NEBULIZER/TUBING/MOUTHPIECE) KIT Use as directed. 1 kit 3   Vitamin D-Vitamin K (VITAMIN K2-VITAMIN D3 PO) Take 2 drops by mouth daily. Omega 3 2 Drops =5000 Units     No current facility-administered medications for this visit.   No results found.  Review of Systems:   A ROS was performed including pertinent positives and negatives as documented in the HPI.   Musculoskeletal Exam:    There were no vitals taken for this visit.  Left hip incisions are well-healed.  Active forward elevation of the left hip is to 120 degrees with 30 degrees internal/external rotation with minimal to no pain.  There is no pain about the groin.  Able to straight leg raise of the left knee.  Remainder of distal neurosensory exam is intact 2+ dorsalis pedis pulse.  Imaging:      I personally reviewed and interpreted the radiographs.   Assessment:   6 weeks status post left hip arthroscopy overall doing very well.  At this time she will continue to advance according to protocol including strengthening.  All restrictions and limitations were discussed.  I will plan to see her back in 6 weeks for reassessment  Plan :    -Return to clinic 6 weeks      I personally saw and evaluated the patient, and participated in the management and treatment plan.  Vanetta Mulders, MD Attending Physician, Orthopedic Surgery  This document was dictated using Dragon voice recognition software. A reasonable attempt at proof reading has been made to minimize errors.

## 2022-05-01 ENCOUNTER — Ambulatory Visit (HOSPITAL_BASED_OUTPATIENT_CLINIC_OR_DEPARTMENT_OTHER): Payer: Commercial Managed Care - HMO | Admitting: Physical Therapy

## 2022-05-01 ENCOUNTER — Encounter (HOSPITAL_BASED_OUTPATIENT_CLINIC_OR_DEPARTMENT_OTHER): Payer: Self-pay | Admitting: Physical Therapy

## 2022-05-01 DIAGNOSIS — M25552 Pain in left hip: Secondary | ICD-10-CM | POA: Diagnosis not present

## 2022-05-01 DIAGNOSIS — M6281 Muscle weakness (generalized): Secondary | ICD-10-CM

## 2022-05-01 DIAGNOSIS — R262 Difficulty in walking, not elsewhere classified: Secondary | ICD-10-CM

## 2022-05-01 DIAGNOSIS — R600 Localized edema: Secondary | ICD-10-CM

## 2022-05-01 NOTE — Therapy (Signed)
OUTPATIENT PHYSICAL THERAPY LOWER EXTREMITY TREATMENT   Patient Name: Maria Smith MRN: [id number] DOB:[birth year], 26 y.o., female Today's Date: 05/01/2022   PT End of Session - 05/01/22 0849     Visit Number 7    Number of Visits 30    Date for PT Re-Evaluation 06/21/22    Authorization Type [employer]    PT Start Time 0801    PT Stop Time 0841    PT Time Calculation (min) 40 min    Activity Tolerance Patient tolerated treatment well    Behavior During Therapy [facility] for tasks assessed/performed                  Past Medical History:  Diagnosis Date   Allergic rhinitis    Allergy    SEASONAL   Asthma    Complication of anesthesia    Low BP   Eczema    Environmental allergies    Age 65 years   Family history of adverse reaction to anesthesia    Mother went into coma after anesthesia. 4 days to wake up   Fatty liver 12/11/2018   Noted on CT 6/20   Generalized headaches    Migraines. 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 Rifampin since 08/2015   Streptococcal infection(041.00)    Age 74 and 25 years old   TB lung, latent    2017   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/2018   Diffuse enlargement on [place] 6/20   Past Surgical History:  Procedure Laterality Date   ESOPHAGOGASTRODUODENOSCOPY ENDOSCOPY  2021   HIP ARTHROSCOPY Left 03/20/2022   Procedure: LEFT HIP ARTHROSCOPY WITH LABRAL REPAIR;  Surgeon: Vanetta Mulders, MD;  Location: [facility];  Service: Orthopedics;  Laterality: Left;   TONSILLECTOMY  06/18/2010   WISDOM TOOTH EXTRACTION     Patient Active Problem List   Diagnosis Date Noted   Tear of left acetabular labrum    Pain in left hip 01/27/2021   Bruising 01/27/2021   Left-sided chest wall pain 03/23/2020   Positive ANA (antinuclear antibody) 03/23/2020   Asthma 02/10/2020   Allergic rhinitis 02/10/2020   GERD (gastroesophageal reflux disease) 02/10/2020   History of TB  (tuberculosis) 08/21/2019   Throat pain 01/28/2019   Odynophagia 01/28/2019   Neck pain 01/28/2019   Thyroiditis 01/12/2019   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/2018   Fatty liver 12/11/2018   PCOS (polycystic ovarian syndrome) 11/11/2018   Seasonal allergies 09/22/2018   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   Migraine with aura and without status migrainosus, not intractable 09/11/2012   Variants of migraine, not elsewhere classified, without mention of intractable migraine without mention of status migrainosus 09/11/2012   Unspecified constipation 09/11/2012   Syncope 08/31/2010   Neck Pain 08/31/2010   Insomnia 08/31/2010    PCP:   Billie Ruddy, MD     REFERRING PROVIDER: Vanetta Mulders, MD   REFERRING DIAG: [id number] (ICD-10-CM) - Tear of left acetabular labrum, initial encounter   THERAPY DIAG:  Pain in left hip  Difficulty walking  Muscle weakness (generalized)  Localized edema  Rationale for Evaluation and Treatment Rehabilitation  ONSET DATE: 03/20/22    1.  Left hip labral repair 2.  Left hip pincer debridement   Days since surgery: 42   SUBJECTIVE:   SUBJECTIVE STATEMENT:  Pt states she is doing well. She is now without a brace or crutches. Pt has had no pain  with the hip but does have problems with laying prone while sleeping.    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 Works for [employer] at [employer].   Pt states since surgery she has a lot of pain and has difficulty with the hip. Unsure of if brace on properly. Pt denies signs of infection. Pt has pain with movement. Pt is only 3 days out from surgery at this time. Pt denies NT.   PERTINENT HISTORY: N/A  PAIN:  Are you having pain? 0: NPRS scale: 0/10 Pain location: side and at incision Pain description: discomfort, achy Aggravating factors: movement Relieving factors: rest  PRECAUTIONS: Other: Hip  labral repair  WEIGHT BEARING RESTRICTIONS None  FALLS:  Has  patient fallen in last 6 months? 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 Small focal cartilage defect at the superolateral acetabulum of the left hip. 2. Smooth cleft of contrast at the anterior chondrolabral junction is favored to represent a normal sublabral sulcus. No evidence of a labral tear. 3. Trace left peritrochanteric bursal fluid.  The cleft of contrast at the anterior chondrolabral junction of the left hip described in impression #2 may represent a normal sublabral sulcus. A partially detached labral tear could have a similar appearance in the appropriate clinical setting. Correlate with exam.    PATIENT SURVEYS:  FOTO 11 60 @ DC 19 pts MCII  66 FOTO 6th visit  TODAY'S TREATMENT:  11/14 Recumbent bike seat #2 Lvl 1; 5 min;   [place]igan hip mobs inf and lateral grade III at L hip  Exercises -butterfly stretch 30s 3x -standing hip flexor stretch 30s 3x Fwd and lateral step up 2x10 each -supine LE deadbug 2x10 each -Squat at bar 2x8 with 3s hold (to parallel)   11/7 Recumbent bike seat #2 Lvl 1; 5 min; able to go past 90  Exercises -standing hip flexor stretch 30s 3x -standing groin/adductor stretch 30s 3x -mini squat 2x10 -sidestepping RTB at knee 2x laps at bar -supine bridge with march 3x8 -figure 4 stretch 30s 3x -supine HS stretch 10s 5x -Child's pose, hips open position 10s 5x   Previous:   Pt seen for aquatic therapy today.  Treatment took place in water 3.25-4.5 ft in depth at the [facility]. Temp of water was 92.  Pt entered/exited the pool via stairs with step-to pattern independently with  bilat rail.  * intro to [employer] * holding wall:  weight shifts with straight knees (side to side); heel raises;  squats  * holding white barbell:  forward gait (cues for even step length and to relax Lt foot); backward gait; side stepping with varied step height * Holding wall: small outward hip circles each LE * alternating toe taps to 1st step, no UE support * Lt forward step ups on bottom step with BUE on rails x 10 * Holding wall:  L SLS (80% submerged) with intermittent UE support  * bilat calf stretch with heels off of step  Pt requires the buoyancy and hydrostatic pressure of water for support, and to offload joints by unweighting joint load by at least 50 % in  navel deep water and by at least 75-80% in chest to neck deep water.  Viscosity of the water is needed for resistance of strengthening. Water current perturbations provides challenge to standing balance requiring increased core activation.     PATIENT EDUCATION:  Education details: anatomy, exercise progression, AD/brace usage, muscle firing,  envelope of function, HEP, POC  Person educated: Patient Education method: Explanation, Demonstration, Tactile cues, Verbal cues, and Handouts Education comprehension: verbalized understanding, returned demonstration, verbal cues required, and tactile cues required   HOME EXERCISE PROGRAM: Access Code: [email or web address] URL: [email or web address] Date: 03/23/2022 Prepared by: Daleen Bo  ASSESSMENT:  CLINICAL IMPRESSION: Patient now 6 weeks postop at this time.  Patient with good response to manual therapy and joint mobilizations with reported decrease stiffness following intervention.  Patient able to progress single-leg strengthening with step ups and lateral step ups.  Patient is still most limited with external rotation and internal rotation at the left hip.  Patient does note that she has increased fatigue and anterior hip tightness following exercise that is likely just  due to muscle weakness.  Home exercise program updated at this time.  Patient without pain throughout session.  Plan to continue with joint mobilizations as tolerated and progressive hip strengthening and single-leg stability.  Pt would benefit from continued skilled therapy in order to reach goals and maximize functional L LE strength and ROM for full return to PLOF.    OBJECTIVE IMPAIRMENTS Abnormal gait, decreased activity tolerance, decreased balance, decreased endurance, decreased knowledge of use of DME, decreased mobility, difficulty walking, decreased ROM, decreased strength, hypomobility, increased edema, increased fascial restrictions, increased muscle spasms, impaired flexibility, improper body mechanics, and pain.   ACTIVITY LIMITATIONS carrying, lifting, sitting, standing, squatting, sleeping, stairs, transfers, bed mobility, bathing, toileting, dressing, locomotion level, and caring for others  PARTICIPATION LIMITATIONS: meal prep, cleaning, laundry, interpersonal relationship, driving, shopping, community activity, occupation, yard work, and exercise  PERSONAL FACTORS Time since onset of injury/illness/exacerbation and 1-2 comorbidities:    are also affecting patient's functional outcome.   REHAB POTENTIAL: Good  CLINICAL DECISION MAKING: Stable/uncomplicated  EVALUATION COMPLEXITY: Low   GOALS:   SHORT TERM GOALS: Target date: 05/04/22 Pt will become independent with HEP in order to demonstrate synthesis of PT education.   Goal status: MET  2.  Pt will be able to demonstrate normal gait with no AD and hip brace in order to demonstrate functional improvement in LE function for self-care and house hold duties.   Goal status: MET  3.  Pt will be able to demonstrate reciprocal stair step pattern with single UE support in order to demonstrate functional improvement in LE function for self-care and house hold duties.   Goal status: ongoing   LONG TERM GOALS: Target  date:06/15/22  Pt  will become independent with final HEP in order to demonstrate synthesis of PT education.  Goal status: ongoing  2.  Pt will be able to demonstrate DL squat with >/=20lbs in order to demonstrate functional improvement in bilat LE strength for return to exercise.   Goal status: ongoing  3.  Pt will be able to demonstrate full lunge on L LE in order to demonstrate functional improvement in LE function for improvement in L LE strength for return to exercise and PLOF. Goal status: ongoing  4.  Pt will be able to demonstrate ability to agility ladder drills without pain in order to demonstrate functional improvement and tolerance to low level plyometric loading.   Goal status: ongoing  5.  Pt will score >/= 60 on FOTO to demonstrate improvement in perceived L hip function.  Goal status: met   PLAN: PT FREQUENCY: 1-2x/week  PT DURATION: 12 weeks  PLANNED INTERVENTIONS: Therapeutic exercises, Therapeutic activity, Neuromuscular re-education, Balance training, Gait training, Patient/Family education, Self Care, Joint mobilization, Joint manipulation, Stair training, DME instructions, Aquatic Therapy, Dry Needling, Electrical stimulation, Wheelchair mobility training, Spinal manipulation, Spinal mobilization, Cryotherapy, Moist heat, scar mobilization, Splintting, Taping, Vasopneumatic device, Traction, Ultrasound, Ionotophoresis 51m/ml Dexamethasone, Manual therapy, and Re-evaluation  PLAN FOR NEXT SESSION: Progress per protocol, SL hip stability, joint mobilizations, work on returning to full ROM, SL balance  ADaleen BoPT, DPT 05/01/22 8:50 AM

## 2022-05-04 ENCOUNTER — Ambulatory Visit (HOSPITAL_BASED_OUTPATIENT_CLINIC_OR_DEPARTMENT_OTHER): Payer: Self-pay | Admitting: Physical Therapy

## 2022-05-04 ENCOUNTER — Encounter (HOSPITAL_BASED_OUTPATIENT_CLINIC_OR_DEPARTMENT_OTHER): Payer: Commercial Managed Care - HMO | Admitting: Physical Therapy

## 2022-05-08 ENCOUNTER — Encounter (HOSPITAL_BASED_OUTPATIENT_CLINIC_OR_DEPARTMENT_OTHER): Payer: Commercial Managed Care - HMO | Admitting: Physical Therapy

## 2022-05-08 ENCOUNTER — Ambulatory Visit (HOSPITAL_BASED_OUTPATIENT_CLINIC_OR_DEPARTMENT_OTHER): Payer: Commercial Managed Care - HMO | Admitting: Physical Therapy

## 2022-05-08 ENCOUNTER — Encounter (HOSPITAL_BASED_OUTPATIENT_CLINIC_OR_DEPARTMENT_OTHER): Payer: Self-pay | Admitting: Physical Therapy

## 2022-05-08 DIAGNOSIS — R262 Difficulty in walking, not elsewhere classified: Secondary | ICD-10-CM

## 2022-05-08 DIAGNOSIS — M25552 Pain in left hip: Secondary | ICD-10-CM

## 2022-05-08 DIAGNOSIS — M6281 Muscle weakness (generalized): Secondary | ICD-10-CM

## 2022-05-08 DIAGNOSIS — R600 Localized edema: Secondary | ICD-10-CM

## 2022-05-08 NOTE — Therapy (Signed)
OUTPATIENT PHYSICAL THERAPY LOWER EXTREMITY TREATMENT   Patient Name: Maria Smith MRN: [id number] DOB:December 26, 1995, 26 y.o., female Today's Date: 05/08/2022   PT End of Session - 05/08/22 0838     Visit Number 8    Number of Visits 30    Date for PT Re-Evaluation 06/21/22    Authorization Type [employer]    PT Start Time 0803    PT Stop Time 0835    PT Time Calculation (min) 32 min    Activity Tolerance Patient tolerated treatment well    Behavior During Therapy [facility] for tasks assessed/performed                   Past Medical History:  Diagnosis Date   Allergic rhinitis    Allergy    SEASONAL   Asthma    Complication of anesthesia    Low BP   Eczema    Environmental allergies    Age 58 years   Family history of adverse reaction to anesthesia    Mother went into coma after anesthesia. 4 days to wake up   Fatty liver 12/11/2018   Noted on CT 6/20   Generalized headaches    Migraines. 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 Rifampin since 08/2015   Streptococcal infection(041.00)    Age 58 and 26 years old   TB lung, latent    2017   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/2018   Diffuse enlargement on [place] 6/20   Past Surgical History:  Procedure Laterality Date   ESOPHAGOGASTRODUODENOSCOPY ENDOSCOPY  2021   HIP ARTHROSCOPY Left 03/20/2022   Procedure: LEFT HIP ARTHROSCOPY WITH LABRAL REPAIR;  Surgeon: Vanetta Mulders, MD;  Location: [place];  Service: Orthopedics;  Laterality: Left;   TONSILLECTOMY  06/18/2010   WISDOM TOOTH EXTRACTION     Patient Active Problem List   Diagnosis Date Noted   Tear of left acetabular labrum    Pain in left hip 01/27/2021   Bruising 01/27/2021   Left-sided chest wall pain 03/23/2020   Positive ANA (antinuclear antibody) 03/23/2020   Asthma 02/10/2020   Allergic rhinitis 02/10/2020   GERD (gastroesophageal reflux disease) 02/10/2020   History of TB  (tuberculosis) 08/21/2019   Throat pain 01/28/2019   Odynophagia 01/28/2019   Neck pain 01/28/2019   Thyroiditis 01/12/2019   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/2018   Fatty liver 12/11/2018   PCOS (polycystic ovarian syndrome) 11/11/2018   Seasonal allergies 09/22/2018   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   Migraine with aura and without status migrainosus, not intractable 09/11/2012   Variants of migraine, not elsewhere classified, without mention of intractable migraine without mention of status migrainosus 09/11/2012   Unspecified constipation 09/11/2012   Syncope 08/31/2010   Neck Pain 08/31/2010   Insomnia 08/31/2010    PCP:   Billie Ruddy, MD     REFERRING PROVIDER: Vanetta Mulders, MD   REFERRING DIAG: [phone] (ICD-10-CM) - Tear of left acetabular labrum, initial encounter   THERAPY DIAG:  Pain in left hip  Difficulty walking  Muscle weakness (generalized)  Localized edema  Rationale for Evaluation and Treatment Rehabilitation  ONSET DATE: 03/20/22    1.  Left hip labral repair 2.  Left hip pincer debridement   Days since surgery: 49   SUBJECTIVE:   SUBJECTIVE STATEMENT:  Pt states she is having more anterior hip aching and soreness. She does have history of ovarian cysts.  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 Works for [employer] at [employer].   Pt states since surgery she has a lot of pain and has difficulty with the hip. Unsure of if brace on properly. Pt denies signs of infection. Pt has pain with movement. Pt is only 3 days out from surgery at this time. Pt denies NT.   PERTINENT HISTORY: N/A  PAIN:  Are you having pain? Yes: NPRS scale: 3/10 Pain location: side and at incision Pain description: discomfort, achy Aggravating factors: movement Relieving factors: rest  PRECAUTIONS: Other: Hip  labral repair  WEIGHT BEARING RESTRICTIONS None  FALLS:  Has patient fallen in last 6 months? 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 Small focal cartilage defect at the superolateral acetabulum of the left hip. 2. Smooth cleft of contrast at the anterior chondrolabral junction is favored to represent a normal sublabral sulcus. No evidence of a labral tear. 3. Trace left peritrochanteric bursal fluid.  The cleft of contrast at the anterior chondrolabral junction of the left hip described in impression #2 may represent a normal sublabral sulcus. A partially detached labral tear could have a similar appearance in the appropriate clinical setting. Correlate with exam.    PATIENT SURVEYS:  FOTO 11 60 @ DC 19 pts MCII  66 FOTO 6th visit  TODAY'S TREATMENT:  11/21 STM L VL, rec fem/vastus intermedius S/L hip extension mob and stretch grade III  Demo of self massage, progression of sidestepping to GTB  11/14 Recumbent bike seat #2 Lvl 1; 5 min;   Mulligan hip mobs inf and lateral grade III at L hip  Exercises -butterfly stretch 30s 3x -standing hip flexor stretch 30s 3x Fwd and lateral step up 2x10 each -supine LE deadbug 2x10 each -Squat at bar 2x8 with 3s hold (to parallel)   11/7 Recumbent bike seat #2 Lvl 1; 5 min; able to go past 90  Exercises -standing hip flexor stretch 30s 3x -standing groin/adductor stretch 30s 3x -mini squat 2x10 -sidestepping RTB at knee 2x laps at bar -supine bridge with march 3x8 -figure 4 stretch 30s 3x -supine HS stretch 10s 5x -Child's pose, hips open position 10s 5x   Previous:   Pt seen for aquatic therapy today.  Treatment took place in water 3.25-4.5 ft in depth at the [place]. Temp of water was 92.  Pt entered/exited the pool via stairs with step-to pattern independently with bilat rail.  * intro to [employer] * holding wall:  weight shifts with straight knees (side to side); heel raises;  squats  * holding white barbell:  forward gait (cues for even step length and to relax Lt foot); backward gait; side stepping with varied step height * Holding wall: small outward hip circles each LE * alternating toe taps to 1st step, no UE support * Lt forward step ups on bottom step with BUE on rails x 10 * Holding wall:  L SLS (80% submerged) with intermittent UE support  * bilat calf stretch with heels off of step  Pt requires the buoyancy and hydrostatic pressure of water for support, and to offload  joints by unweighting joint load by at least 50 % in navel deep water and by at least 75-80% in chest to neck deep water.  Viscosity of the water is needed for resistance of strengthening. Water current perturbations provides challenge to standing balance requiring increased core activation.     PATIENT EDUCATION:  Education details: anatomy, exercise progression, AD/brace usage, muscle firing,  envelope of function, HEP, POC  Person educated: Patient Education method: Explanation, Demonstration, Tactile cues, Verbal cues, and Handouts Education comprehension: verbalized understanding, returned demonstration, verbal cues required, and tactile cues required   HOME EXERCISE PROGRAM: Access Code: [email or web address] URL: [email or web address] Date: 03/23/2022 Prepared by: Daleen Bo  ASSESSMENT:  CLINICAL IMPRESSION: Patient now 7 weeks postop at this time.  Patient presents to session with report of increased left thigh and groin pain.  Patient with several large trigger points within the left quadriceps group with reproducible pain upon palpation.  Pain consistent with referred trigger point pain within the left quad.  Patient had total abolishment of pain by end  of session following manual therapy.  Patient given education about self massage and self recovery technique.  Plan to continue with strengthening at next session as today was used as a pain management session.  Patient HEP updated verbally with progression of hip strengthening provided.  Pt would benefit from continued skilled therapy in order to reach goals and maximize functional L LE strength and ROM for full return to PLOF.    OBJECTIVE IMPAIRMENTS Abnormal gait, decreased activity tolerance, decreased balance, decreased endurance, decreased knowledge of use of DME, decreased mobility, difficulty walking, decreased ROM, decreased strength, hypomobility, increased edema, increased fascial restrictions, increased muscle spasms, impaired flexibility, improper body mechanics, and pain.   ACTIVITY LIMITATIONS carrying, lifting, sitting, standing, squatting, sleeping, stairs, transfers, bed mobility, bathing, toileting, dressing, locomotion level, and caring for others  PARTICIPATION LIMITATIONS: meal prep, cleaning, laundry, interpersonal relationship, driving, shopping, community activity, occupation, yard work, and exercise  PERSONAL FACTORS Time since onset of injury/illness/exacerbation and 1-2 comorbidities:    are also affecting patient's functional outcome.   REHAB POTENTIAL: Good  CLINICAL DECISION MAKING: Stable/uncomplicated  EVALUATION COMPLEXITY: Low   GOALS:   SHORT TERM GOALS: Target date: 05/04/22 Pt will become independent with HEP in order to demonstrate synthesis of PT education.   Goal status: MET  2.  Pt will be able to demonstrate normal gait with no AD and hip brace in order to demonstrate functional improvement in LE function for self-care and house hold duties.   Goal status: MET  3.  Pt will be able to demonstrate reciprocal stair step pattern with single UE support in order to demonstrate functional improvement in LE function for self-care and house hold  duties.   Goal status: ongoing   LONG TERM GOALS: Target date:06/15/22  Pt  will become independent with final HEP in order to demonstrate synthesis of PT education.  Goal status: ongoing  2.  Pt will be able to demonstrate DL squat with >/=20lbs in order to demonstrate functional improvement in bilat LE strength for return to exercise.   Goal status: ongoing  3.  Pt will be able to demonstrate full lunge on L LE in order to demonstrate functional improvement in LE function for improvement in L LE strength for return to exercise and PLOF. Goal status: ongoing  4.  Pt will be able to demonstrate ability to agility ladder drills without pain in order to demonstrate functional improvement and tolerance to low level plyometric  loading.   Goal status: ongoing  5.  Pt will score >/= 60 on FOTO to demonstrate improvement in perceived L hip function.  Goal status: met   PLAN: PT FREQUENCY: 1-2x/week  PT DURATION: 12 weeks  PLANNED INTERVENTIONS: Therapeutic exercises, Therapeutic activity, Neuromuscular re-education, Balance training, Gait training, Patient/Family education, Self Care, Joint mobilization, Joint manipulation, Stair training, DME instructions, Aquatic Therapy, Dry Needling, Electrical stimulation, Wheelchair mobility training, Spinal manipulation, Spinal mobilization, Cryotherapy, Moist heat, scar mobilization, Splintting, Taping, Vasopneumatic device, Traction, Ultrasound, Ionotophoresis 15m/ml Dexamethasone, Manual therapy, and Re-evaluation  PLAN FOR NEXT SESSION: Progress per protocol, SL hip stability, joint mobilizations, work on returning to full ROM, SL balance  ADaleen BoPT, DPT 05/08/22 8:38 AM

## 2022-05-09 ENCOUNTER — Ambulatory Visit (HOSPITAL_BASED_OUTPATIENT_CLINIC_OR_DEPARTMENT_OTHER): Payer: Self-pay | Admitting: Physical Therapy

## 2022-05-15 ENCOUNTER — Encounter (HOSPITAL_BASED_OUTPATIENT_CLINIC_OR_DEPARTMENT_OTHER): Payer: Self-pay | Admitting: Physical Therapy

## 2022-05-15 ENCOUNTER — Ambulatory Visit (HOSPITAL_BASED_OUTPATIENT_CLINIC_OR_DEPARTMENT_OTHER): Payer: Commercial Managed Care - HMO | Admitting: Physical Therapy

## 2022-05-15 DIAGNOSIS — M25552 Pain in left hip: Secondary | ICD-10-CM | POA: Diagnosis not present

## 2022-05-15 DIAGNOSIS — R262 Difficulty in walking, not elsewhere classified: Secondary | ICD-10-CM

## 2022-05-15 DIAGNOSIS — M6281 Muscle weakness (generalized): Secondary | ICD-10-CM

## 2022-05-15 DIAGNOSIS — R600 Localized edema: Secondary | ICD-10-CM

## 2022-05-15 NOTE — Therapy (Signed)
OUTPATIENT PHYSICAL THERAPY LOWER EXTREMITY TREATMENT   Patient Name: Maria Smith MRN: [id number] DOB:[birth year], 26 y.o., female Today's Date: 05/15/2022   PT End of Session - 05/15/22 0855     Visit Number 9    Number of Visits 30    Date for PT Re-Evaluation 06/21/22    Authorization Type [employer]    PT Start Time 0802    PT Stop Time 0840    PT Time Calculation (min) 38 min    Activity Tolerance Patient tolerated treatment well    Behavior During Therapy [facility] for tasks assessed/performed                   Past Medical History:  Diagnosis Date   Allergic rhinitis    Allergy    SEASONAL   Asthma    Complication of anesthesia    Low BP   Eczema    Environmental allergies    Age 4 years   Family history of adverse reaction to anesthesia    Mother went into coma after anesthesia. 4 days to wake up   Fatty liver 12/11/2018   Noted on CT 6/20   Generalized headaches    Migraines. 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 Rifampin since 08/2015   Streptococcal infection(041.00)    Age 61 and 26 years old   TB lung, latent    2017   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/2018   Diffuse enlargement on [place] 6/20   Past Surgical History:  Procedure Laterality Date   ESOPHAGOGASTRODUODENOSCOPY ENDOSCOPY  2021   HIP ARTHROSCOPY Left 03/20/2022   Procedure: LEFT HIP ARTHROSCOPY WITH LABRAL REPAIR;  Surgeon: Vanetta Mulders, MD;  Location: [place];  Service: Orthopedics;  Laterality: Left;   TONSILLECTOMY  06/18/2010   WISDOM TOOTH EXTRACTION     Patient Active Problem List   Diagnosis Date Noted   Tear of left acetabular labrum    Pain in left hip 01/27/2021   Bruising 01/27/2021   Left-sided chest wall pain 03/23/2020   Positive ANA (antinuclear antibody) 03/23/2020   Asthma 02/10/2020   Allergic rhinitis 02/10/2020   GERD (gastroesophageal reflux disease) 02/10/2020   History of TB  (tuberculosis) 08/21/2019   Throat pain 01/28/2019   Odynophagia 01/28/2019   Neck pain 01/28/2019   Thyroiditis 01/12/2019   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/2018   Fatty liver 12/11/2018   PCOS (polycystic ovarian syndrome) 11/11/2018   Seasonal allergies 09/22/2018   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   Migraine with aura and without status migrainosus, not intractable 09/11/2012   Variants of migraine, not elsewhere classified, without mention of intractable migraine without mention of status migrainosus 09/11/2012   Unspecified constipation 09/11/2012   Syncope 08/31/2010   Neck Pain 08/31/2010   Insomnia 08/31/2010    PCP:   Billie Ruddy, MD     REFERRING PROVIDER: Vanetta Mulders, MD   REFERRING DIAG: [phone] (ICD-10-CM) - Tear of left acetabular labrum, initial encounter   THERAPY DIAG:  Pain in left hip  Difficulty walking  Muscle weakness (generalized)  Localized edema  Rationale for Evaluation and Treatment Rehabilitation  ONSET DATE: 03/20/22    1.  Left hip labral repair 2.  Left hip pincer debridement   Days since surgery: 56   SUBJECTIVE:   SUBJECTIVE STATEMENT:  Pt states that the hip feels better after foam rolling and doing Mulligan mob at work with her  supervisor.    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 Works for [employer] at [employer].   Pt states since surgery she has a lot of pain and has difficulty with the hip. Unsure of if brace on properly. Pt denies signs of infection. Pt has pain with movement. Pt is only 3 days out from surgery at this time. Pt denies NT.   PERTINENT HISTORY: N/A  PAIN:  Are you having pain? NO: NPRS scale: 0/10 Pain location: side and at incision Pain description: discomfort, achy Aggravating factors: movement Relieving factors: rest  PRECAUTIONS: Other: Hip  labral repair  WEIGHT BEARING RESTRICTIONS None  FALLS:  Has patient fallen in last 6 months? 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 Small focal cartilage defect at the superolateral acetabulum of the left hip. 2. Smooth cleft of contrast at the anterior chondrolabral junction is favored to represent a normal sublabral sulcus. No evidence of a labral tear. 3. Trace left peritrochanteric bursal fluid.  The cleft of contrast at the anterior chondrolabral junction of the left hip described in impression #2 may represent a normal sublabral sulcus. A partially detached labral tear could have a similar appearance in the appropriate clinical setting. Correlate with exam.    PATIENT SURVEYS:  FOTO 11 60 @ DC 19 pts MCII  66 FOTO 6th visit  TODAY'S TREATMENT:  11/28 Mulligan lateral and inf mob grade III; LAD with mulligan belt grade III  Gray leg press 10lbs 3x8 (up2 down1) Fwd and latreal step up 8" box 2x10 Bosu half squat 2x10 Full plank 15s 4x Bird dog 2x10  11/21 STM L VL, rec fem/vastus intermedius S/L hip extension mob and stretch grade III  Demo of self massage, progression of sidestepping to GTB  11/14 Recumbent bike seat #2 Lvl 1; 5 min;   Mulligan hip mobs inf and lateral grade III at L hip  Exercises -butterfly stretch 30s 3x -standing hip flexor stretch 30s 3x Fwd and lateral step up 2x10 each -supine LE deadbug 2x10 each -Squat at bar 2x8 with 3s hold (to parallel)   11/7 Recumbent bike seat #2 Lvl 1; 5 min; able to go past 90  Exercises -standing hip flexor stretch 30s 3x -standing groin/adductor stretch 30s 3x -mini squat 2x10 -sidestepping RTB at knee 2x laps at bar -supine bridge with march 3x8 -figure 4  stretch 30s 3x -supine HS stretch 10s 5x -Child's pose, hips open position 10s 5x   Previous:   Pt seen for aquatic therapy today.  Treatment took place in water 3.25-4.5 ft in depth at the [place]. Temp of water was 92.  Pt entered/exited the pool via stairs with step-to pattern independently with bilat rail.  * intro to [employer] * holding wall:  weight shifts with straight knees (side to side); heel raises;  squats  * holding white barbell:  forward gait (cues for even step length and to relax Lt foot); backward gait; side stepping with varied step height * Holding wall: small outward hip circles each LE * alternating toe taps to 1st step, no UE support * Lt forward step ups  on bottom step with BUE on rails x 10 * Holding wall:  L SLS (80% submerged) with intermittent UE support  * bilat calf stretch with heels off of step  Pt requires the buoyancy and hydrostatic pressure of water for support, and to offload joints by unweighting joint load by at least 50 % in navel deep water and by at least 75-80% in chest to neck deep water.  Viscosity of the water is needed for resistance of strengthening. Water current perturbations provides challenge to standing balance requiring increased core activation.     PATIENT EDUCATION:  Education details: anatomy, exercise progression, AD/brace usage, muscle firing,  envelope of function, HEP, POC  Person educated: Patient Education method: Explanation, Demonstration, Tactile cues, Verbal cues, and Handouts Education comprehension: verbalized understanding, returned demonstration, verbal cues required, and tactile cues required   HOME EXERCISE PROGRAM: Access Code: [email or web address] URL: [email or web address] Date: 03/23/2022 Prepared by: Daleen Bo  ASSESSMENT:  CLINICAL IMPRESSION: Patient now 8 weeks postop at this time.  Patient able to continue with progression of left lower extremity strengthening at today's  session.  Patient with less left hip joint stiffness after manual therapy and joint mobilization.  Home exercise program updated accordingly and patient advised that she may return to gym-based exercise with the exception of lunging, heavy weighted squats, stairstepper, and heavy dead lifts.  Patient advised that she can do any isolation exercise that she would like and begin cardio on an elliptical or exercise bike as tolerated.  Plan to continue per protocol as tolerated.  Pt would benefit from continued skilled therapy in order to reach goals and maximize functional L LE strength and ROM for full return to PLOF.    OBJECTIVE IMPAIRMENTS Abnormal gait, decreased activity tolerance, decreased balance, decreased endurance, decreased knowledge of use of DME, decreased mobility, difficulty walking, decreased ROM, decreased strength, hypomobility, increased edema, increased fascial restrictions, increased muscle spasms, impaired flexibility, improper body mechanics, and pain.   ACTIVITY LIMITATIONS carrying, lifting, sitting, standing, squatting, sleeping, stairs, transfers, bed mobility, bathing, toileting, dressing, locomotion level, and caring for others  PARTICIPATION LIMITATIONS: meal prep, cleaning, laundry, interpersonal relationship, driving, shopping, community activity, occupation, yard work, and exercise  PERSONAL FACTORS Time since onset of injury/illness/exacerbation and 1-2 comorbidities:    are also affecting patient's functional outcome.   REHAB POTENTIAL: Good  CLINICAL DECISION MAKING: Stable/uncomplicated  EVALUATION COMPLEXITY: Low   GOALS:   SHORT TERM GOALS: Target date: 05/04/22 Pt will become independent with HEP in order to demonstrate synthesis of PT education.   Goal status: MET  2.  Pt will be able to demonstrate normal gait with no AD and hip brace in order to demonstrate functional improvement in LE function for self-care and house hold duties.   Goal status:  MET  3.  Pt will be able to demonstrate reciprocal stair step pattern with single UE support in order to demonstrate functional improvement in LE function for self-care and house hold duties.   Goal status: ongoing   LONG TERM GOALS: Target date:06/15/22  Pt  will become independent with final HEP in order to demonstrate synthesis of PT education.  Goal status: ongoing  2.  Pt will be able to demonstrate DL squat with >/=20lbs in order to demonstrate functional improvement in bilat LE strength for return to exercise.   Goal status: ongoing  3.  Pt will be able to demonstrate full lunge on L LE in order to demonstrate functional improvement in LE function for improvement in  L LE strength for return to exercise and PLOF. Goal status: ongoing  4.  Pt will be able to demonstrate ability to agility ladder drills without pain in order to demonstrate functional improvement and tolerance to low level plyometric loading.   Goal status: ongoing  5.  Pt will score >/= 60 on FOTO to demonstrate improvement in perceived L hip function.  Goal status: met   PLAN: PT FREQUENCY: 1-2x/week  PT DURATION: 12 weeks  PLANNED INTERVENTIONS: Therapeutic exercises, Therapeutic activity, Neuromuscular re-education, Balance training, Gait training, Patient/Family education, Self Care, Joint mobilization, Joint manipulation, Stair training, DME instructions, Aquatic Therapy, Dry Needling, Electrical stimulation, Wheelchair mobility training, Spinal manipulation, Spinal mobilization, Cryotherapy, Moist heat, scar mobilization, Splintting, Taping, Vasopneumatic device, Traction, Ultrasound, Ionotophoresis 81m/ml Dexamethasone, Manual therapy, and Re-evaluation  PLAN FOR NEXT SESSION: Progress per protocol, SL hip stability, joint mobilizations, work on returning to full ROM, SL balance  ADaleen BoPT, DPT 05/15/22 8:58 AM

## 2022-05-17 NOTE — Progress Notes (Signed)
Cardiology CONSULT Note    Date:  05/18/2022   ID:  Maria Smith, DOB 03/24/1996, MRN [id number]  PCP:  Maria, Betty G, MD  Cardiologist:  Fransico Him, MD   No chief complaint on file.   History of Present Illness:  Maria Smith is a 26 y.o. female who is being seen today for the evaluation of Palpitations at the request of Maria, Maria So, MD.  This is a 26yo AAF with a hx of migraine HAs who was seen remotely by cardiology in 2020 for palpitations and chest pain that was noncardiac.  Heart monitor showed no arrhythmias. 2D echo showed normal LVF  She tells me that recently she started having fluttering in her chest that is very problematic at night but also notices it during the day.  It feels like a fluttering with skipped beats.  She does have some pain in her chest but occurs with certain movements and think sit is MSK.  She also has noticed DOE when walking short distances.  She recently had hip surgery for a labral tear and she has noticed the SOB since then.  She was out of work for 6 weeks. She drinks 2 cups of coffee daily and no soda or tea.  She has been trying to change her diet but is concerned about her cholesterol because her mom had heart disease and died of a CVA.   Past Medical History:  Diagnosis Date   Allergic rhinitis    Allergy    SEASONAL   Asthma    Complication of anesthesia    Low BP   Eczema    Environmental allergies    Age 63 years   Family history of adverse reaction to anesthesia    Mother went into coma after anesthesia. 4 days to wake up   Fatty liver 12/11/2018   Noted on CT 6/20   Generalized headaches    Migraines. 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/2015   Streptococcal infection(041.00)    Age 41 and 26 years old   TB lung, latent    2017   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/2018   Diffuse enlargement on [place] 6/20    Past Surgical  History:  Procedure Laterality Date   ESOPHAGOGASTRODUODENOSCOPY ENDOSCOPY  2021   HIP ARTHROSCOPY Left 03/20/2022   Procedure: LEFT HIP ARTHROSCOPY WITH LABRAL REPAIR;  Surgeon: Vanetta Mulders, MD;  Location: [facility];  Service: Orthopedics;  Laterality: Left;   TONSILLECTOMY  06/18/2010   WISDOM TOOTH EXTRACTION      Current Medications: Current Meds  Medication Sig   acetaminophen (TYLENOL CHILDRENS) 160 MG/5ML suspension Take 31.3 mLs (1,000 mg total) by mouth every 8 (eight) hours as needed.   albuterol (VENTOLIN HFA) 108 (90 Base) MCG/ACT inhaler Inhale 2 puffs into the lungs every 6 (six) hours as needed for wheezing or shortness of breath.   ferrous sulfate 325 (65 FE) MG tablet Take 325 mg by mouth every other day.   fluticasone (FLONASE) 50 MCG/ACT nasal spray Place 2 sprays into both nostrils daily.   ibuprofen (ADVIL) 100 MG/5ML suspension Take 30 mLs (600 mg total) by mouth every 8 (eight) hours as needed.   influenza vac split quadrivalent PF (FLUARIX) 0.5 ML injection Inject into the muscle.   Respiratory Therapy Supplies (NEBULIZER/TUBING/MOUTHPIECE) KIT Use as directed.   Vitamin D-Vitamin  K (VITAMIN K2-VITAMIN D3 PO) Take 2 drops by mouth daily. Omega 3 2 Drops =5000 Units    Allergies:   Patient has no known allergies.   Social History   Socioeconomic History   Marital status: Married    Spouse name: Not on file   Number of children: 2   Years of education: Not on file   Highest education level: Bachelor's degree (e.Smith., BA, AB, BS)  Occupational History   Occupation: dietary  Tobacco Use   Smoking status: Never   Smokeless tobacco: Never  Vaping Use   Vaping Use: Never used  Substance and Sexual Activity   Alcohol use: Not Currently   Drug use: No   Sexual activity: Yes    Partners: Female    Comment: female partner  Other Topics Concern   Not on file  Social History Narrative   Patient works at [employer]   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    Social Determinants of Health   Financial Resource Strain: Low Risk  (08/29/2021)   Overall Financial Resource Strain (CARDIA)    Difficulty of Paying Living Expenses: Not hard at all  Food Insecurity: No Food Insecurity (08/29/2021)   Hunger Vital Sign    Worried About Running Out of Food in the Last Year: Never true    Ran Out of Food in the Last Year: Never true  Transportation Needs: No Transportation Needs (08/29/2021)   PRAPARE - Hydrologist (Medical): No    Lack of Transportation (Non-Medical): No  Physical Activity: Insufficiently Active (08/29/2021)   Exercise Vital Sign    Days of Exercise per Week: 2 days    Minutes of Exercise per Session: 30 min  Stress: No Stress Concern Present (08/29/2021)   [place]    Feeling of Stress : Not at all  Social Connections: Unknown (08/29/2021)   Social Connection and Isolation Panel [NHANES]    Frequency of Communication with Friends and Family: More than three times a week    Frequency of Social Gatherings with Friends and Family: Patient refused    Attends Religious Services: Patient refused    Marine scientist or Organizations: No    Attends Music therapist: Not on file    Marital Status: Married     Family History:  The patient's family history includes Allergic rhinitis in an other family member; Anemia in her mother and sister; Asthma in her brother and another family member; COPD in her mother; CVA in her mother; Cholecystitis in her brother; Cirrhosis in her mother; Congestive Heart Failure in her mother; Depression in an other family member; Diabetes in her mother and another family member; HIV in her father; Hypertension in her mother and another family member; Irritable bowel syndrome in her mother; Migraines in her  maternal uncle, paternal aunt, paternal grandfather, and paternal grandmother; Obesity in her mother; Rheum arthritis in her mother; Ulcers in her brother.   ROS:   Please see the history of present illness.    ROS All other systems reviewed and are negative.      No data to display             PHYSICAL EXAM:   VS:  BP 108/68   Pulse 79   Ht _0  (1.499  m)   Wt 116 lb (52.6 kg)   LMP  (LMP Unknown)   SpO2 98%   BMI 23.43 kg/m    GEN: Well nourished, well developed, in no acute distress  HEENT: normal  Neck: no JVD, carotid bruits, or masses Cardiac: RRR; no murmurs, rubs, or gallops,no edema.  Intact distal pulses bilaterally.  Respiratory:  clear to auscultation bilaterally, normal work of breathing GI: soft, nontender, nondistended, + BS MS: no deformity or atrophy  Skin: warm and dry, no rash Neuro:  Alert and Oriented x 3, Strength and sensation are intact Psych: euthymic mood, full affect  Wt Readings from Last 3 Encounters:  05/18/22 116 lb (52.6 kg)  03/20/22 116 lb (52.6 kg)  03/05/22 116 lb 3.2 oz (52.7 kg)      Studies/Labs Reviewed:   EKG:  EKG is not ordered today.    Recent Labs: 07/06/2021: TSH 1.580 03/05/2022: ALT 13; BUN <5; Creatinine, Ser 0.79; Hemoglobin 12.9; Platelets 361; Potassium 3.7; Sodium 138   Lipid Panel    Component Value Date/Time   CHOL 167 06/26/2017 1155   TRIG 32 06/26/2017 1155   HDL 94 06/26/2017 1155   CHOLHDL 1.8 06/26/2017 1155   LDLCALC 67 06/26/2017 1155    Additional studies/ records that were reviewed today include:   Event monitor and 2D echo  ASSESSMENT:    1. Palpitations   2. Family history of vascular disease   3. SOB (shortness of breath)      PLAN:  In order of problems listed above:  Palpitations -I will get a 2 week ziopatch to assess for arrhythmias -cut back on caffeine some  2.  Fm hx of vascular disease -her mom died of a CVA -I will get an NMR with Lp(a)  3.  SOB -unclear  etiology but ? Whether she is deconditioned from recent hip surgery -check ddimer given recent surgery -check 2D echo  Time Spent: 20 minutes total time of encounter, including 15 minutes spent in face-to-face patient care on the date of this encounter. This time includes coordination of care and counseling regarding above mentioned problem list. Remainder of non-face-to-face time involved reviewing chart documents/testing relevant to the patient encounter and documentation in the medical record. I have independently reviewed documentation from referring provider  Medication Adjustments/Labs and Tests Ordered: Current medicines are reviewed at length with the patient today.  Concerns regarding medicines are outlined above.  Medication changes, Labs and Tests ordered today are listed in the Patient Instructions below.  There are no Patient Instructions on file for this visit.   Signed, Fransico Him, MD  05/18/2022 10:17 AM    [facility] [place], [place], [place]  [address] Phone: [phone]; Fax: [phone]

## 2022-05-18 ENCOUNTER — Encounter: Payer: Self-pay | Admitting: Cardiology

## 2022-05-18 ENCOUNTER — Ambulatory Visit: Payer: Commercial Managed Care - HMO | Attending: Cardiovascular Disease | Admitting: Cardiology

## 2022-05-18 ENCOUNTER — Ambulatory Visit: Payer: Commercial Managed Care - HMO | Attending: Cardiology

## 2022-05-18 ENCOUNTER — Encounter (HOSPITAL_BASED_OUTPATIENT_CLINIC_OR_DEPARTMENT_OTHER): Payer: Commercial Managed Care - HMO | Admitting: Physical Therapy

## 2022-05-18 VITALS — BP 108/68 | HR 79 | Ht 59.0 in | Wt 116.0 lb

## 2022-05-18 DIAGNOSIS — R002 Palpitations: Secondary | ICD-10-CM

## 2022-05-18 DIAGNOSIS — Z8249 Family history of ischemic heart disease and other diseases of the circulatory system: Secondary | ICD-10-CM | POA: Diagnosis not present

## 2022-05-18 DIAGNOSIS — R0602 Shortness of breath: Secondary | ICD-10-CM | POA: Insufficient documentation

## 2022-05-18 LAB — D-DIMER, QUANTITATIVE: D-DIMER: 0.36 mg/L FEU (ref 0.00–0.49)

## 2022-05-18 NOTE — Progress Notes (Unsigned)
Enrolled patient for a 14 day Zio XT  monitor to be mailed to patients home  °

## 2022-05-18 NOTE — Addendum Note (Signed)
Addended by: Daleen Bo I on: 05/18/2022 10:26 AM   Modules accepted: Orders

## 2022-05-18 NOTE — Patient Instructions (Signed)
Medication Instructions:  Your physician recommends that you continue on your current medications as directed. Please refer to the Current Medication list given to you today.  *If you need a refill on your cardiac medications before your next appointment, please call your pharmacy*   Lab Work: TODAY STAT: D-Dimer  Return for NMR lipoprofile and Lipoprotein A (LPa) If you have labs (blood work) drawn today and your tests are completely normal, you will receive your results only by: MyChart Message (if you have MyChart) OR A paper copy in the mail If you have any lab test that is abnormal or we need to change your treatment, we will call you to review the results.   Testing/Procedures: Your physician has requested that you have an echocardiogram. Echocardiography is a painless test that uses sound waves to create images of your heart. It provides your doctor with information about the size and shape of your heart and how well your heart's chambers and valves are working. This procedure takes approximately one hour. There are no restrictions for this procedure. Please do NOT wear cologne, perfume, aftershave, or lotions (deodorant is allowed). Please arrive 15 minutes prior to your appointment time.  ZIO XT- Long Term Monitor Instructions  Your physician has requested you wear a ZIO patch monitor for 14 days.  This is a single patch monitor. [employer] supplies one patch monitor per enrollment. Additional stickers are not available. Please do not apply patch if you will be having a Nuclear Stress Test,  Echocardiogram, Cardiac CT, MRI, or Chest Xray during the period you would be wearing the  monitor. The patch cannot be worn during these tests. You cannot remove and re-apply the  ZIO XT patch monitor.  Your ZIO patch monitor will be mailed 3 day USPS to your address on file. It may take 3-5 days  to receive your monitor after you have been enrolled.  Once you have received your monitor,  please review the enclosed instructions. Your monitor  has already been registered assigning a specific monitor serial # to you.  Billing and Patient Assistance Program Information  We have supplied [employer] with any of your insurance information on file for billing purposes. [employer] offers a sliding scale Patient Assistance Program for patients that do not have  insurance, or whose insurance does not completely cover the cost of the ZIO monitor.  You must apply for the Patient Assistance Program to qualify for this discounted rate.  To apply, please call [employer] at [phone], select option 4, select option 2, ask to apply for  Patient Assistance Program. Meredeth Ide will ask your household income, and how many people  are in your household. They will quote your out-of-pocket cost based on that information.  [employer] will also be able to set up a 86-month, interest-free payment plan if needed.  Applying the monitor   Shave hair from upper left chest.  Hold abrader disc by orange tab. Rub abrader in 40 strokes over the upper left chest as  indicated in your monitor instructions.  Clean area with 4 enclosed alcohol pads. Let dry.  Apply patch as indicated in monitor instructions. Patch will be placed under collarbone on left  side of chest with arrow pointing upward.  Rub patch adhesive wings for 2 minutes. Remove white label marked "1". Remove the white  label marked "2". Rub patch adhesive wings for 2 additional minutes.  While looking in a mirror, press and release button in center of patch. A small green light will  flash 3-4 times. This will be your only indicator that the monitor has been turned on.  Do not shower for the first 24 hours. You may shower after the first 24 hours.  Press the button if you feel a symptom. You will hear a small click. Record Date, Time and  Symptom in the Patient Logbook.  When you are ready to remove the patch, follow instructions on the last 2 pages of  Patient  Logbook. Stick patch monitor onto the last page of Patient Logbook.  Place Patient Logbook in the blue and white box. Use locking tab on box and tape box closed  securely. The blue and white box has prepaid postage on it. Please place it in the mailbox as  soon as possible. Your physician should have your test results approximately 7 days after the  monitor has been mailed back to [facility].  Call [facility] Customer Care at [phone] if you have questions regarding  your ZIO XT patch monitor. Call them immediately if you see an orange light blinking on your  monitor.  If your monitor falls off in less than 4 days, contact our Monitor department at [phone].  If your monitor becomes loose or falls off after 4 days call [employer] at [phone] for  suggestions on securing your monitor   Follow-Up: AS NEEDED  Important Information About Sugar

## 2022-05-22 ENCOUNTER — Ambulatory Visit (HOSPITAL_BASED_OUTPATIENT_CLINIC_OR_DEPARTMENT_OTHER): Payer: Commercial Managed Care - HMO | Admitting: Physical Therapy

## 2022-05-23 DIAGNOSIS — R002 Palpitations: Secondary | ICD-10-CM

## 2022-05-24 ENCOUNTER — Ambulatory Visit: Payer: Commercial Managed Care - HMO | Admitting: Cardiovascular Disease

## 2022-05-24 ENCOUNTER — Encounter (HOSPITAL_BASED_OUTPATIENT_CLINIC_OR_DEPARTMENT_OTHER): Payer: Commercial Managed Care - HMO | Admitting: Physical Therapy

## 2022-05-29 ENCOUNTER — Ambulatory Visit (HOSPITAL_BASED_OUTPATIENT_CLINIC_OR_DEPARTMENT_OTHER): Payer: Commercial Managed Care - HMO | Admitting: Physical Therapy

## 2022-05-30 ENCOUNTER — Telehealth: Payer: Self-pay | Admitting: Cardiology

## 2022-05-30 NOTE — Telephone Encounter (Signed)
Patient called to say that she had to take the heart monitor off. She states the tape make her break out. Please advise

## 2022-05-31 ENCOUNTER — Ambulatory Visit (HOSPITAL_BASED_OUTPATIENT_CLINIC_OR_DEPARTMENT_OTHER): Payer: Commercial Managed Care - HMO | Admitting: Physical Therapy

## 2022-05-31 NOTE — Telephone Encounter (Signed)
Patient had worn the monitor between 4-5 days and developed blisters from adhesive.  She removed the monitor.   She did have symptoms while she was wearing the monitor. [employer] considers and monitor worn over 4 days complete. Patient was instructed to mail monitor back to [facility] asap to process.  It will be billed for the 3-7 day period instead of 8-14 day.  It will still go on this years insurance if [employer] receives it by the end of the month.   If patient has to wear a monitor in the future, we would recommend the [employer] long term monitor with the sensitive skin alternative electrodes.

## 2022-05-31 NOTE — Telephone Encounter (Signed)
Will route this message to our monitor dept to further assist the pt with this matter.

## 2022-06-05 ENCOUNTER — Ambulatory Visit (HOSPITAL_BASED_OUTPATIENT_CLINIC_OR_DEPARTMENT_OTHER): Payer: Commercial Managed Care - HMO | Admitting: Physical Therapy

## 2022-06-06 ENCOUNTER — Ambulatory Visit (INDEPENDENT_AMBULATORY_CARE_PROVIDER_SITE_OTHER): Payer: Commercial Managed Care - HMO | Admitting: Internal Medicine

## 2022-06-06 ENCOUNTER — Encounter: Payer: Self-pay | Admitting: Internal Medicine

## 2022-06-06 VITALS — BP 110/70 | HR 76 | Temp 98.3°F | Wt 112.9 lb

## 2022-06-06 DIAGNOSIS — J029 Acute pharyngitis, unspecified: Secondary | ICD-10-CM

## 2022-06-06 DIAGNOSIS — R051 Acute cough: Secondary | ICD-10-CM

## 2022-06-06 DIAGNOSIS — J069 Acute upper respiratory infection, unspecified: Secondary | ICD-10-CM

## 2022-06-06 DIAGNOSIS — J4540 Moderate persistent asthma, uncomplicated: Secondary | ICD-10-CM

## 2022-06-06 DIAGNOSIS — R0981 Nasal congestion: Secondary | ICD-10-CM | POA: Diagnosis not present

## 2022-06-06 DIAGNOSIS — J4520 Mild intermittent asthma, uncomplicated: Secondary | ICD-10-CM

## 2022-06-06 LAB — POC COVID19 BINAXNOW: SARS Coronavirus 2 Ag: NEGATIVE

## 2022-06-06 LAB — POCT RAPID STREP A (OFFICE): Rapid Strep A Screen: NEGATIVE

## 2022-06-06 MED ORDER — ALBUTEROL SULFATE HFA 108 (90 BASE) MCG/ACT IN AERS: 2.0000 | INHALATION_SPRAY | Freq: Four times a day (QID) | RESPIRATORY_TRACT | 2 refills | Status: DC | PRN

## 2022-06-06 NOTE — Progress Notes (Signed)
   Established Patient Office Visit     CC/Reason for Visit: URI symptoms  HPI: Maria Smith is a 26 y.o. female who is coming in today for the above mentioned reasons.  She has a history of asthma.  For the past 4 days she has been coughing, having postnasal drip, her voice is hoarse.  Last [place] the [place].  She has run out of her albuterol inhaler and is requesting a refill.   Past Medical/Surgical History: Past Medical History:  Diagnosis Date   Allergic rhinitis    Allergy    SEASONAL   Asthma    Complication of anesthesia    Low BP   Eczema    Environmental allergies    Age 13 years   Family history of adverse reaction to anesthesia    Mother went into coma after anesthesia. 4 days to wake up   Fatty liver 12/11/2018   Noted on CT 6/20   Generalized headaches    Migraines. 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 She has never used smokeless tobacco. She reports that she does not currently use alcohol. She reports that she does not use drugs.  Allergies: No Known Allergies  Family History:  Family History  Problem Relation Age of Onset   Migraines Paternal Grandmother    Migraines Paternal Grandfather    HIV Father    Diabetes Mother    Hypertension Mother    Anemia Mother    COPD  Mother    CVA Mother    Congestive Heart Failure Mother    Cirrhosis Mother    Obesity Mother    Irritable bowel syndrome Mother    Rheum arthritis Mother    Ulcers Brother    Anemia Sister    Cholecystitis Brother    Migraines Paternal Aunt    Migraines Maternal Uncle        Maternal Great Uncle   Diabetes Other        Family History   Hypertension Other        Family History   Depression Other        Family History   Asthma Other        Family History   Allergic rhinitis Other        Family History   Asthma Brother    Colon cancer Neg Hx    Colon polyps Neg Hx    Esophageal cancer Neg Hx    Rectal cancer Neg Hx    Stomach cancer Neg Hx      Current Outpatient Medications:    acetaminophen (TYLENOL CHILDRENS) 160 MG/5ML suspension, Take 31.3 mLs (1,000 mg total) by mouth every 8 (eight) hours as needed., Disp: 500   mL, Rfl: 0   ferrous sulfate 325 (65 FE) MG tablet, Take 325 mg by mouth every other day., Disp: , Rfl:    fluticasone (FLONASE) 50 MCG/ACT nasal spray, Place 2 sprays into both nostrils daily., Disp: 16 g, Rfl: 1   ibuprofen (ADVIL) 100 MG/5ML suspension, Take 30 mLs (600 mg total) by mouth every 8 (eight) hours as needed., Disp: 450 mL, Rfl: 0   influenza vac split quadrivalent PF (FLUARIX) 0.5 ML injection, Inject into the muscle., Disp: 0.5 mL, Rfl: 0   Respiratory Therapy Supplies (NEBULIZER/TUBING/MOUTHPIECE) KIT, Use as directed., Disp: 1 kit, Rfl: 3   Vitamin D-Vitamin K (VITAMIN K2-VITAMIN D3 PO), Take 2 drops by mouth daily. Omega 3 2 Drops =5000 Units, Disp: , Rfl:    albuterol (VENTOLIN HFA) 108 (90 Base) MCG/ACT inhaler, Inhale 2 puffs into the lungs every 6 (six) hours as needed for wheezing or shortness of breath., Disp: 8 g, Rfl: 2  Review of Systems:  Constitutional: Denies fever, chills, diaphoresis, appetite change . HEENT: Denies photophobia, eye pain, redness, h mouth sores, trouble swallowing, neck pain, neck stiffness and tinnitus.    Respiratory: Denies SOB, DOE, chest tightness,  and wheezing.   Cardiovascular: Denies chest pain, palpitations and leg swelling.  Gastrointestinal: Denies nausea, vomiting, abdominal pain, diarrhea, constipation, blood in stool and abdominal distention.  Genitourinary: Denies dysuria, urgency, frequency, hematuria, flank pain and difficulty urinating.  Endocrine: Denies: hot or cold intolerance, sweats, changes in hair or nails, polyuria, polydipsia. Musculoskeletal: Denies myalgias, back pain, joint swelling, arthralgias and gait problem.  Skin: Denies pallor, rash and wound.  Neurological: Denies dizziness, seizures, syncope, weakness, light-headedness, numbness and headaches.  Hematological: Denies adenopathy. Easy bruising, personal or family bleeding history  Psychiatric/Behavioral: Denies suicidal ideation, mood changes, confusion, nervousness, sleep disturbance and agitation    Physical Exam: Vitals:   06/06/22 1055  BP: 110/70  Pulse: 76  Temp: 98.3 F (36.8 C)  TempSrc: Oral  SpO2: 98%  Weight: 112 lb 14.4 oz (51.2 kg)    Body mass index is 22.8 kg/m.   Constitutional: NAD, calm, comfortable Eyes: PERRL, lids and conjunctivae normal ENMT: Mucous membranes are moist. Posterior pharynx is erythematous but clear of any exudate or lesions. Normal dentition. Tympanic membrane is pearly white, no erythema or bulging. Respiratory: clear to auscultation bilaterally, no wheezing, no crackles. Normal respiratory effort. No accessory muscle use.  Cardiovascular: Regular rate and rhythm, no murmurs / rubs / gallops. No extremity edema. Psychiatric: Normal judgment and insight. Alert and oriented x 3. Normal mood.    Impression and Plan:  Acute cough - Plan: POC COVID-19 BinaxNow  Nasal congestion - Plan: POC COVID-19 BinaxNow  Sore throat - Plan: POC COVID-19 BinaxNow, POCT rapid strep A  URI with cough and congestion  Mild intermittent asthma without  complication  Moderate persistent asthma without complication - Plan: albuterol (VENTOLIN HFA) 108 (90 Base) MCG/ACT inhaler  -In office flu and COVID tests are negative. -Given exam findings, PNA, pharyngitis, ear infection are not likely, hence abx have not been prescribed. -Have advised rest, fluids, OTC antihistamines, cough suppressants and mucinex. -RTC if no improvement in 10-14 days. -Albuterol inhaler refilled, have not prescribed oral steroids as she does not appear to have an acute asthma exacerbation.  Time spent:22 minutes reviewing chart, interviewing and examining patient and formulating plan of care.       Lelon Frohlich, MD [facility]

## 2022-06-07 ENCOUNTER — Encounter (HOSPITAL_BASED_OUTPATIENT_CLINIC_OR_DEPARTMENT_OTHER): Payer: Commercial Managed Care - HMO | Admitting: Physical Therapy

## 2022-06-07 ENCOUNTER — Encounter (HOSPITAL_BASED_OUTPATIENT_CLINIC_OR_DEPARTMENT_OTHER): Payer: Commercial Managed Care - HMO | Admitting: Orthopaedic Surgery

## 2022-06-12 ENCOUNTER — Ambulatory Visit: Payer: Commercial Managed Care - HMO | Attending: Family Medicine

## 2022-06-12 ENCOUNTER — Ambulatory Visit (HOSPITAL_BASED_OUTPATIENT_CLINIC_OR_DEPARTMENT_OTHER): Payer: Commercial Managed Care - HMO | Admitting: Physical Therapy

## 2022-06-12 ENCOUNTER — Ambulatory Visit: Payer: Commercial Managed Care - HMO

## 2022-06-12 ENCOUNTER — Ambulatory Visit (HOSPITAL_COMMUNITY): Payer: Commercial Managed Care - HMO | Attending: Cardiology

## 2022-06-12 DIAGNOSIS — R262 Difficulty in walking, not elsewhere classified: Secondary | ICD-10-CM | POA: Diagnosis present

## 2022-06-12 DIAGNOSIS — Z8249 Family history of ischemic heart disease and other diseases of the circulatory system: Secondary | ICD-10-CM

## 2022-06-12 DIAGNOSIS — M6281 Muscle weakness (generalized): Secondary | ICD-10-CM | POA: Insufficient documentation

## 2022-06-12 DIAGNOSIS — R0602 Shortness of breath: Secondary | ICD-10-CM | POA: Diagnosis present

## 2022-06-12 DIAGNOSIS — M25552 Pain in left hip: Secondary | ICD-10-CM | POA: Insufficient documentation

## 2022-06-12 DIAGNOSIS — R600 Localized edema: Secondary | ICD-10-CM | POA: Insufficient documentation

## 2022-06-12 LAB — ECHOCARDIOGRAM COMPLETE
Area-P 1/2: 3.63 cm2
S' Lateral: 2.5 cm

## 2022-06-12 NOTE — Therapy (Signed)
OUTPATIENT PHYSICAL THERAPY LOWER EXTREMITY TREATMENT/ RE-EVALUATION   Patient Name: Maria Smith MRN: [id number] DOB:09/27/1995, 26 y.o., female Today's Date: 06/12/2022   PT End of Session - 06/12/22 1811     Visit Number 10    Number of Visits 18    Date for PT Re-Evaluation 08/14/22    Authorization Type [employer]    PT Start Time 1743    PT Stop Time 1823    PT Time Calculation (min) 40 min    Activity Tolerance Patient tolerated treatment well    Behavior During Therapy [facility] for tasks assessed/performed                    Past Medical History:  Diagnosis Date   Allergic rhinitis    Allergy    SEASONAL   Asthma    Complication of anesthesia    Low BP   Eczema    Environmental allergies    Age 60 years   Family history of adverse reaction to anesthesia    Mother went into coma after anesthesia. 4 days to wake up   Fatty liver 12/11/2018   Noted on CT 6/20   Generalized headaches    Migraines. 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 Rifampin since 08/2015   Streptococcal infection(041.00)    Age 26 and 26 years old   TB lung, latent    2017   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/2018   Diffuse enlargement on [place] 6/20   Past Surgical History:  Procedure Laterality Date   ESOPHAGOGASTRODUODENOSCOPY ENDOSCOPY  2021   HIP ARTHROSCOPY Left 03/20/2022   Procedure: LEFT HIP ARTHROSCOPY WITH LABRAL REPAIR;  Surgeon: Vanetta Mulders, MD;  Location: [place];  Service: Orthopedics;  Laterality: Left;   TONSILLECTOMY  06/18/2010   WISDOM TOOTH EXTRACTION     Patient Active Problem List   Diagnosis Date Noted   Tear of left acetabular labrum    Pain in left hip 01/27/2021   Bruising 01/27/2021   Left-sided chest wall pain 03/23/2020   Positive ANA (antinuclear antibody) 03/23/2020   Asthma 02/10/2020   Allergic rhinitis 02/10/2020   GERD (gastroesophageal reflux disease) 02/10/2020    History of TB (tuberculosis) 08/21/2019   Throat pain 01/28/2019   Odynophagia 01/28/2019   Neck pain 01/28/2019   Thyroiditis 01/12/2019   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/2018   Fatty liver 12/11/2018   PCOS (polycystic ovarian syndrome) 11/11/2018   Seasonal allergies 09/22/2018   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   Migraine with aura and without status migrainosus, not intractable 09/11/2012   Variants of migraine, not elsewhere classified, without mention of intractable migraine without mention of status migrainosus 09/11/2012   Unspecified constipation 09/11/2012   Syncope 08/31/2010   Neck Pain 08/31/2010   Insomnia 08/31/2010    PCP:   Billie Ruddy, MD     REFERRING PROVIDER: Vanetta Mulders, MD   REFERRING DIAG: [phone] (ICD-10-CM) - Tear of left acetabular labrum, initial encounter   THERAPY DIAG:  Pain in left hip - Plan: PT plan of care cert/re-cert  Difficulty walking - Plan: PT plan of care cert/re-cert  Muscle weakness (generalized) - Plan: PT plan of care cert/re-cert  Localized edema - Plan: PT plan of care cert/re-cert  Rationale for Evaluation and Treatment Rehabilitation  ONSET DATE: 03/20/22    1.  Left hip labral repair 2.  Left hip pincer debridement  Days since surgery: 84   SUBJECTIVE:   SUBJECTIVE STATEMENT:  Pt reports she is doing much better since her hip surgery, although she still has a lot of Lt hip stiffness and some residual weakness. She reports she would like to continue PT to strengthen her hip further.    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 Works for [employer] at [employer].   Pt states since surgery she has a lot of pain and has difficulty with the hip. Unsure of if brace on properly. Pt denies signs of infection. Pt has pain with movement. Pt is only 3 days out from surgery at this time. Pt denies NT.   PERTINENT HISTORY: N/A  PAIN:  Are you having pain? NO: NPRS scale: 0/10 Pain  location: side and at incision Pain description: discomfort, achy Aggravating factors: movement Relieving factors: rest  PRECAUTIONS: Other: Hip  labral repair  WEIGHT BEARING RESTRICTIONS None  FALLS:  Has patient fallen in last 6 months? 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 Small focal cartilage defect at the superolateral acetabulum of the left hip. 2. Smooth cleft of contrast at the anterior chondrolabral junction is favored to represent a normal sublabral sulcus. No evidence of a labral tear. 3. Trace left peritrochanteric bursal fluid.  The cleft of contrast at the anterior chondrolabral junction of the left hip described in impression #2 may represent a normal sublabral sulcus. A partially detached labral tear could have a similar appearance in the appropriate clinical setting. Correlate with exam.    PATIENT SURVEYS:  FOTO 11 60 @ DC 19 pts MCII  66 FOTO 6th visit  LE ROM:  A/PROM Right 06/12/2022 Left 06/12/2022  Hip flexion 130/140 110/125, mild pain  Hip abduction 35/55 30/50  Hip internal rotation 35/60 25/40  Hip external rotation 35/40 32/52   (Blank rows = not tested)  LE MMT:  MMT Right 06/12/2022 Left 06/12/2022  Hip flexion 5/5 4/5p!  Hip extension 4/5 3+/5  Hip abduction 5/5 3+/5  Hip internal rotation 5/5 5/5p!  Hip external rotation 5/5 4/5   (Blank rows = not tested)    Functional tests:  06/12/2022: DL squat with two 10# kettlebells: Achieved x10    Forward lunge: x5 BIL with WNL depth   TODAY'S TREATMENT:  [facility] Adult PT Treatment:                                                DATE:  06/12/2022 Therapeutic Exercise: 8-inch Lt step up with Rt knee drive with 67# kettlebell in Rt UE 2x10 Side steps with high knees with RTB around thighs 2x4 laps of length of table Forward lunge to 6-inch box with RTB around thighs 2x10 BIL Manual Therapy: N/A Neuromuscular re-ed: N/A Therapeutic Activity: Re-assessment of objective measures for re-evaluation Modalities: N/A Self Care: N/A   11/28 Mulligan lateral and inf mob grade III; LAD with mulligan belt grade III  Gray leg press 10lbs 3x8 (up2 down1) Fwd and latreal step  up 8" box 2x10 Bosu half squat 2x10 Full plank 15s 4x Bird dog 2x10  11/21 STM L VL, rec fem/vastus intermedius S/L hip extension mob and stretch grade III  Demo of self massage, progression of sidestepping to GTB   PATIENT EDUCATION:  Education details: anatomy, exercise progression, AD/brace usage, muscle firing,  envelope of function, HEP, POC  Person educated: Patient Education method: Explanation, Demonstration, Tactile cues, Verbal cues, and Handouts Education comprehension: verbalized understanding, returned demonstration, verbal cues required, and tactile cues required   HOME EXERCISE PROGRAM: Access Code: [email or web address] URL: [email or web address] Date: 03/23/2022 Prepared by: Daleen Bo  ASSESSMENT:  CLINICAL IMPRESSION: Upon re-evaluation, the pt has made progress in her functional squat and lunge, although she remains limited in Lt hip strength and ROM. New goals target these impairments.  Additionally, pt reports not feeling ready for agility drills at this point, although this will be an aim of future treatments. She will continue to benefit from skilled PT to address her primary impairments and return to her prior level of function with less limitation.   OBJECTIVE IMPAIRMENTS Abnormal gait, decreased activity tolerance, decreased balance, decreased endurance, decreased knowledge of use of DME, decreased mobility,  difficulty walking, decreased ROM, decreased strength, hypomobility, increased edema, increased fascial restrictions, increased muscle spasms, impaired flexibility, improper body mechanics, and pain.   ACTIVITY LIMITATIONS carrying, lifting, sitting, standing, squatting, sleeping, stairs, transfers, bed mobility, bathing, toileting, dressing, locomotion level, and caring for others  PARTICIPATION LIMITATIONS: meal prep, cleaning, laundry, interpersonal relationship, driving, shopping, community activity, occupation, yard work, and exercise  PERSONAL FACTORS Time since onset of injury/illness/exacerbation and 1-2 comorbidities:    are also affecting patient's functional outcome.     GOALS:   SHORT TERM GOALS: Target date: 05/04/22 Pt will become independent with HEP in order to demonstrate synthesis of PT education.   Goal status: MET  2.  Pt will be able to demonstrate normal gait with no AD and hip brace in order to demonstrate functional improvement in LE function for self-care and house hold duties.   Goal status: MET  3.  Pt will be able to demonstrate reciprocal stair step pattern with single UE support in order to demonstrate functional improvement in LE function for self-care and house hold duties.   Goal status: ongoing   LONG TERM GOALS: Target date:06/15/22  Pt  will become independent with final HEP in order to demonstrate synthesis of PT education.  Goal status: ongoing  2.  Pt will be able to demonstrate DL squat with >/=20lbs in order to demonstrate functional improvement in bilat LE strength for return to exercise.  06/12/2022: Achieved x10 Goal status: ACHIEVED  3.  Pt will be able to demonstrate full lunge on L LE in order to demonstrate functional improvement in LE function for improvement in L LE strength for return to exercise and PLOF.   06/12/2022: Achieved x5 BIL Goal status: ACHIEVED  4.  Pt will be able to demonstrate ability to agility ladder  drills without pain in order to demonstrate functional improvement and tolerance to low level plyometric loading.   Goal status: ongoing  5.  Pt will score >/= 60 on FOTO to demonstrate improvement in perceived L hip function.  Goal status: met  6.  *Added 06/12/2022* Pt will achieve global Lt hip MMT of 4+/5 or greater in order to progress her independent LE strengthening regimen with less limitation.  Goal status: INITIAL  7.  *Added 06/12/2022* Pt will achieve Lt hip flexion  AROM of 125 degrees or greater in order to normalize her functional gait.  Goal status: INITIAL   PLAN: PT FREQUENCY: 1x/week  PT DURATION: 8 weeks  PLANNED INTERVENTIONS: Therapeutic exercises, Therapeutic activity, Neuromuscular re-education, Balance training, Gait training, Patient/Family education, Self Care, Joint mobilization, Joint manipulation, Stair training, DME instructions, Aquatic Therapy, Dry Needling, Electrical stimulation, Wheelchair mobility training, Spinal manipulation, Spinal mobilization, Cryotherapy, Moist heat, scar mobilization, Splintting, Taping, Vasopneumatic device, Traction, Ultrasound, Ionotophoresis 53m/ml Dexamethasone, Manual therapy, and Re-evaluation  PLAN FOR NEXT SESSION: Progress LE loaded strengthening exercises with progression to plyometrics/ dynamic strengthening  YVanessa Oconee PT, DPT 06/12/22 6:23 PM

## 2022-06-13 ENCOUNTER — Other Ambulatory Visit: Payer: Self-pay

## 2022-06-13 ENCOUNTER — Telehealth: Payer: Self-pay

## 2022-06-13 ENCOUNTER — Ambulatory Visit (INDEPENDENT_AMBULATORY_CARE_PROVIDER_SITE_OTHER): Payer: Commercial Managed Care - HMO | Admitting: Orthopaedic Surgery

## 2022-06-13 DIAGNOSIS — R002 Palpitations: Secondary | ICD-10-CM

## 2022-06-13 DIAGNOSIS — S73192A Other sprain of left hip, initial encounter: Secondary | ICD-10-CM

## 2022-06-13 LAB — NMR, LIPOPROFILE
Cholesterol, Total: 186 mg/dL (ref 100–199)
HDL Particle Number: 35.3 umol/L (ref >=30.5)
HDL-C: 83 mg/dL (ref >39)
LDL Particle Number: 728 nmol/L (ref <1000)
LDL Size: 21 nm (ref >20.5)
LDL-C (NIH Calc): 93 mg/dL (ref 0–99)
LP-IR Score: <25 (ref <=45)
Small LDL Particle Number: <90 nmol/L (ref <=527)
Triglycerides: 49 mg/dL (ref 0–149)

## 2022-06-13 LAB — LIPOPROTEIN A (LPA): Lipoprotein (a): 21.5 nmol/L (ref <75.0)

## 2022-06-13 NOTE — Telephone Encounter (Signed)
-----   Message from Quintella Reichert, MD sent at 06/12/2022  5:28 PM EST ----- Echo showed normal heart function and trivial leakiness of MV>>essentially normal echo

## 2022-06-13 NOTE — Telephone Encounter (Signed)
Results reviewed with patient who verbalized understanding.  

## 2022-06-13 NOTE — Progress Notes (Signed)
Post Operative Evaluation    Procedure/Date of Surgery: Left hip arthroscopy with labral repair 03/20/2022  Interval History:   Presents today 3 months status post the above procedure.  Overall she is doing very well.  She is weaned out of her brace and is now walking without crutches.  Range of motion is improving.  She is working on strengthening at this time.  PMH/PSH/Family History/Social History/Meds/Allergies:    Past Medical History:  Diagnosis Date   Allergic rhinitis    Allergy    SEASONAL   Asthma    Complication of anesthesia    Low BP   Eczema    Environmental allergies    Age 71 years   Family history of adverse reaction to anesthesia    Mother went into coma after anesthesia. 4 days to wake up   Fatty liver 12/11/2018   Noted on CT 6/20   Generalized headaches    Migraines. 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 Rifampin since 08/2015   Streptococcal infection(041.00)    Age 56 and 26 years old   TB lung, latent    2017   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/2018   Diffuse enlargement on [place] 6/20   Past Surgical History:  Procedure Laterality Date   ESOPHAGOGASTRODUODENOSCOPY ENDOSCOPY  2021   HIP ARTHROSCOPY Left 03/20/2022   Procedure: LEFT HIP ARTHROSCOPY WITH LABRAL REPAIR;  Surgeon: Vanetta Mulders, MD;  Location: [place];  Service: Orthopedics;  Laterality: Left;   TONSILLECTOMY  06/18/2010   WISDOM TOOTH EXTRACTION     Social History   Socioeconomic History   Marital status: Married    Spouse name: Not on file   Number of children: 2   Years of education: Not on file   Highest education level: Bachelor's degree (e.g., BA, AB, BS)  Occupational History   Occupation: dietary  Tobacco Use   Smoking status: Never   Smokeless tobacco: Never  Vaping Use   Vaping Use: Never used  Substance and Sexual Activity   Alcohol use: Not Currently    Drug use: No   Sexual activity: Yes    Partners: Female    Comment: female partner  Other Topics Concern   Not on file  Social History Narrative   Patient works at [employer]   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    Social Determinants of Health   Financial Resource Strain: Low Risk  (08/29/2021)   Overall Financial Resource Strain (CARDIA)    Difficulty of Paying Living Expenses: Not hard at all  Food Insecurity: No Food Insecurity (08/29/2021)   Hunger Vital Sign    Worried About Running Out of Food in the Last Year: Never true    Ran Out of Food in the Last Year: Never true  Transportation Needs: No Transportation Needs (08/29/2021)   PRAPARE - Hydrologist (Medical): No    Lack of Transportation (Non-Medical): No  Physical Activity: Insufficiently Active (08/29/2021)   Exercise Vital Sign    Days of Exercise per Week: 2 days    Minutes  of Exercise per Session: 30 min  Stress: No Stress Concern Present (08/29/2021)   [place]    Feeling of Stress : Not at all  Social Connections: Unknown (08/29/2021)   Social Connection and Isolation Panel [NHANES]    Frequency of Communication with Friends and Family: More than three times a week    Frequency of Social Gatherings with Friends and Family: Patient refused    Attends Religious Services: Patient refused    Marine scientist or Organizations: No    Attends Music therapist: Not on file    Marital Status: Married   Family History  Problem Relation Age of Onset   Migraines Paternal Grandmother    Migraines Paternal Grandfather    HIV Father    Diabetes Mother    Hypertension Mother    Anemia Mother    COPD Mother    CVA Mother    Congestive Heart Failure Mother    Cirrhosis Mother    Obesity Mother    Irritable  bowel syndrome Mother    Rheum arthritis Mother    Ulcers Brother    Anemia Sister    Cholecystitis Brother    Migraines Paternal Aunt    Migraines Maternal Uncle        Maternal Great Uncle   Diabetes Other        Family History   Hypertension Other        Family History   Depression Other        Family History   Asthma Other        Family History   Allergic rhinitis Other        Family History   Asthma Brother    Colon cancer Neg Hx    Colon polyps Neg Hx    Esophageal cancer Neg Hx    Rectal cancer Neg Hx    Stomach cancer Neg Hx    No Known Allergies Current Outpatient Medications  Medication Sig Dispense Refill   acetaminophen (TYLENOL CHILDRENS) 160 MG/5ML suspension Take 31.3 mLs (1,000 mg total) by mouth every 8 (eight) hours as needed. 500 mL 0   albuterol (VENTOLIN HFA) 108 (90 Base) MCG/ACT inhaler Inhale 2 puffs into the lungs every 6 (six) hours as needed for wheezing or shortness of breath. 8 g 2   ferrous sulfate 325 (65 FE) MG tablet Take 325 mg by mouth every other day.     fluticasone (FLONASE) 50 MCG/ACT nasal spray Place 2 sprays into both nostrils daily. 16 g 1   ibuprofen (ADVIL) 100 MG/5ML suspension Take 30 mLs (600 mg total) by mouth every 8 (eight) hours as needed. 450 mL 0   influenza vac split quadrivalent PF (FLUARIX) 0.5 ML injection Inject into the muscle. 0.5 mL 0   Respiratory Therapy Supplies (NEBULIZER/TUBING/MOUTHPIECE) KIT Use as directed. 1 kit 3   Vitamin D-Vitamin K (VITAMIN K2-VITAMIN D3 PO) Take 2 drops by mouth daily. Omega 3 2 Drops =5000 Units     No current facility-administered medications for this visit.   LONG TERM MONITOR (3-14 DAYS)  Result Date: 06/12/2022   Predominant rhythm was normal sinus rhythm with average heart rate 74bpm and ranged from 49 to 136bpm.   Rare PACs and PVCs. Patch Wear Time:  3 days and 14 hours (2023-12-06T18:37:08-498 to 2023-12-10T08:43:41-0500) Patient had a min HR of 49 bpm, max HR of 136 bpm,  and avg HR of 74 bpm. Predominant underlying rhythm  was Sinus Rhythm. Isolated SVEs were rare (<1.0%), and no SVE Couplets or SVE Triplets were present. Isolated VEs were rare (<1.0%), and no VE Couplets  or VE Triplets were present.   ECHOCARDIOGRAM COMPLETE  Result Date: 06/12/2022    ECHOCARDIOGRAM REPORT   Patient Name:   Maria Smith Date of Exam: 06/12/2022 Medical Rec #:  [id number]        Height:       59.0 in Accession #:    [id number]       Weight:       112.9 lb Date of Birth:  01-31-1996        BSA:          1.446 m Patient Age:    26 years         BP:           108/68 mmHg Patient Gender: F                HR:           64 bpm. Exam Location:  [facility] Procedure: 2D Echo, Cardiac Doppler and Color Doppler Indications:    R06.02 SOB  History:        Patient has prior history of Echocardiogram examinations, most                 recent 08/29/2017. Signs/Symptoms:Shortness of Breath.  Sonographer:    Coralyn Helling RDCS Referring Phys: [place]  1. Left ventricular ejection fraction, by estimation, is 55 to 60%. The left ventricle has normal function. The left ventricle has no regional wall motion abnormalities. Left ventricular diastolic parameters were normal.  2. Right ventricular systolic function is normal. The right ventricular size is normal. There is normal pulmonary artery systolic pressure. The estimated right ventricular systolic pressure is 32.4 mmHg.  3. The mitral valve is normal in structure. Trivial mitral valve regurgitation. No evidence of mitral stenosis.  4. The aortic valve is tricuspid. Aortic valve regurgitation is not visualized. No aortic stenosis is present.  5. The inferior vena cava is normal in size with greater than 50% respiratory variability, suggesting right atrial pressure of 3 mmHg. Comparison(s): No significant change from prior study. FINDINGS  Left Ventricle: Left ventricular ejection fraction, by estimation, is 55 to 60%. The left  ventricle has normal function. The left ventricle has no regional wall motion abnormalities. The left ventricular internal cavity size was normal in size. There is  no left ventricular hypertrophy. Left ventricular diastolic parameters were normal. Right Ventricle: The right ventricular size is normal. No increase in right ventricular wall thickness. Right ventricular systolic function is normal. There is normal pulmonary artery systolic pressure. The tricuspid regurgitant velocity is 2.13 m/s, and  with an assumed right atrial pressure of 3 mmHg, the estimated right ventricular systolic pressure is 40.1 mmHg. Left Atrium: Left atrial size was normal in size. Right Atrium: Right atrial size was normal in size. Pericardium: There is no evidence of pericardial effusion. Mitral Valve: The mitral valve is normal in structure. Trivial mitral valve regurgitation. No evidence of mitral valve stenosis. Tricuspid Valve: The tricuspid valve is normal in structure. Tricuspid valve regurgitation is mild. Aortic Valve: The aortic valve is tricuspid. Aortic valve regurgitation is not visualized. No aortic stenosis is present. Pulmonic Valve: The pulmonic valve was normal in structure. Pulmonic valve regurgitation is mild. Aorta: The aortic root and ascending aorta are structurally normal, with no evidence of dilitation. Venous: The  inferior vena cava is normal in size with greater than 50% respiratory variability, suggesting right atrial pressure of 3 mmHg. IAS/Shunts: The atrial septum is grossly normal.  LEFT VENTRICLE PLAX 2D LVIDd:         4.20 cm   Diastology LVIDs:         2.50 cm   LV e' medial:    10.80 cm/s LV PW:         0.80 cm   LV E/e' medial:  8.5 LV IVS:        0.80 cm   LV e' lateral:   17.40 cm/s LVOT diam:     1.70 cm   LV E/e' lateral: 5.3 LV SV:         38 LV SV Index:   27 LVOT Area:     2.27 cm  RIGHT VENTRICLE             IVC RV S prime:     21.70 cm/s  IVC diam: 1.40 cm TAPSE (M-mode): 2.2 cm RVSP:            21.1 mmHg LEFT ATRIUM             Index        RIGHT ATRIUM           Index LA diam:        2.60 cm 1.80 cm/m   RA Pressure: 3.00 mmHg LA Vol (A2C):   22.0 ml 15.21 ml/m  RA Area:     9.84 cm LA Vol (A4C):   20.2 ml 13.97 ml/m  RA Volume:   22.90 ml  15.83 ml/m LA Biplane Vol: 21.9 ml 15.14 ml/m  AORTIC VALVE LVOT Vmax:   86.20 cm/s LVOT Vmean:  53.900 cm/s LVOT VTI:    0.169 m  AORTA Ao Root diam: 2.60 cm Ao Asc diam:  2.40 cm MITRAL VALVE               TRICUSPID VALVE MV Area (PHT): 3.63 cm    TR Peak grad:   18.1 mmHg MV Decel Time: 209 msec    TR Vmax:        213.00 cm/s MV E velocity: 92.20 cm/s  Estimated RAP:  3.00 mmHg MV A velocity: 80.70 cm/s  RVSP:           21.1 mmHg MV E/A ratio:  1.14                            SHUNTS                            Systemic VTI:  0.17 m                            Systemic Diam: 1.70 cm Gwyndolyn Kaufman MD Electronically signed by Gwyndolyn Kaufman MD Signature Date/Time: 06/12/2022/10:39:31 AM    Final     Review of Systems:   A ROS was performed including pertinent positives and negatives as documented in the HPI.   Musculoskeletal Exam:    There were no vitals taken for this visit.  Left hip incisions are well-healed.  Active forward elevation of the left hip is to 120 degrees with 30 degrees internal/external rotation with minimal to no pain.  There is no pain about the groin.  Able to straight leg raise  of the left knee.  Remainder of distal neurosensory exam is intact 2+ dorsalis pedis pulse.  Imaging:      I personally reviewed and interpreted the radiographs.   Assessment:   12 weeks status post left hip arthroscopy overall doing well.  This time she does have some weakness in abduction and flexion.  She will continue to work on this.  All restrictions and limitations were discussed.  I will plan to see her back in 3 months for reassessment  Plan :    -Return to clinic 12 weeks      I personally saw and evaluated the patient,  and participated in the management and treatment plan.  Vanetta Mulders, MD Attending Physician, Orthopedic Surgery  This document was dictated using Dragon voice recognition software. A reasonable attempt at proof reading has been made to minimize errors.

## 2022-06-13 NOTE — Telephone Encounter (Signed)
-----   Message from Quintella Reichert, MD sent at 06/12/2022  5:50 PM EST ----- Essentially normal heart monitor with a rare extra heart beat from top and bottom of heart which are benign.  Have her come in for TSH, BMET and Mag levels.  Try to avoid caffeine and ETOH

## 2022-06-13 NOTE — Telephone Encounter (Signed)
Results given. Labs scheduled. Discussed avoiding caffeine and ETOH.

## 2022-06-13 NOTE — Telephone Encounter (Signed)
-----   Message from Quintella Reichert, MD sent at 06/12/2022  5:51 PM EST ----- Followup with extender in 4 weeks

## 2022-06-14 ENCOUNTER — Encounter (HOSPITAL_BASED_OUTPATIENT_CLINIC_OR_DEPARTMENT_OTHER): Payer: Commercial Managed Care - HMO | Admitting: Orthopaedic Surgery

## 2022-06-14 ENCOUNTER — Encounter (HOSPITAL_BASED_OUTPATIENT_CLINIC_OR_DEPARTMENT_OTHER): Payer: Commercial Managed Care - HMO | Admitting: Physical Therapy

## 2022-06-14 ENCOUNTER — Telehealth: Payer: Self-pay | Admitting: Cardiology

## 2022-06-14 NOTE — Telephone Encounter (Signed)
Patient is requesting a call back to further discuss echo results. She states she has concerns and she would also like to discuss having lab work moved up if possible.

## 2022-06-14 NOTE — Telephone Encounter (Signed)
Called pt reviewed results of Echo and heart monitor.  Pt asked if she would be able to do high intensity exercises.  Advised pt based on results there is nothing that would keep pt from exercising.  Advised pt to pay attention to her body and if begins to have funny heart beats or not feeling okay to call our office. Pt expresses understanding no further questions or concerns voiced.

## 2022-06-19 ENCOUNTER — Telehealth: Payer: Self-pay | Admitting: Cardiovascular Disease

## 2022-06-19 NOTE — Telephone Encounter (Signed)
Patient requesting to switch from Dr. Radford Pax to the [facility] office and see Dr. Oval Linsey.

## 2022-06-20 ENCOUNTER — Encounter: Payer: Self-pay | Admitting: Cardiology

## 2022-06-21 ENCOUNTER — Ambulatory Visit: Payer: Commercial Managed Care - HMO | Attending: Cardiovascular Disease

## 2022-06-21 ENCOUNTER — Ambulatory Visit: Payer: Commercial Managed Care - HMO | Attending: Family Medicine

## 2022-06-21 DIAGNOSIS — M6281 Muscle weakness (generalized): Secondary | ICD-10-CM | POA: Insufficient documentation

## 2022-06-21 DIAGNOSIS — R600 Localized edema: Secondary | ICD-10-CM | POA: Insufficient documentation

## 2022-06-21 DIAGNOSIS — R262 Difficulty in walking, not elsewhere classified: Secondary | ICD-10-CM | POA: Diagnosis present

## 2022-06-21 DIAGNOSIS — M25552 Pain in left hip: Secondary | ICD-10-CM | POA: Diagnosis present

## 2022-06-21 DIAGNOSIS — R002 Palpitations: Secondary | ICD-10-CM | POA: Insufficient documentation

## 2022-06-21 NOTE — Therapy (Signed)
OUTPATIENT PHYSICAL THERAPY LOWER EXTREMITY TREATMENT   Patient Name: Maria Smith MRN: [id number] DOB:1996/01/21, 27 y.o., female Today's Date: 06/21/2022   PT End of Session - 06/21/22 1457     Visit Number 11    Number of Visits 18    Date for PT Re-Evaluation 08/14/22    Authorization Type [employer]    PT Start Time 1500    PT Stop Time 1540    PT Time Calculation (min) 40 min    Activity Tolerance Patient tolerated treatment well    Behavior During Therapy WFL for tasks assessed/performed                     Past Medical History:  Diagnosis Date   Allergic rhinitis    Allergy    SEASONAL   Asthma    Complication of anesthesia    Low BP   Eczema    Environmental allergies    Age 42 years   Family history of adverse reaction to anesthesia    Mother went into coma after anesthesia. 4 days to wake up   Fatty liver 12/11/2018   Noted on CT 6/20   Generalized headaches    Migraines. 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   Streptococcal infection(041.00)    Age 13 and 27 years old   TB lung, latent    2017   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/2018   Diffuse enlargement on [place] 6/20   Past Surgical History:  Procedure Laterality Date   ESOPHAGOGASTRODUODENOSCOPY ENDOSCOPY  2021   HIP ARTHROSCOPY Left 03/20/2022   Procedure: LEFT HIP ARTHROSCOPY WITH LABRAL REPAIR;  Surgeon: Vanetta Mulders, MD;  Location: [facility];  Service: Orthopedics;  Laterality: Left;   TONSILLECTOMY  06/18/2010   WISDOM TOOTH EXTRACTION     Patient Active Problem List   Diagnosis Date Noted   Tear of left acetabular labrum    Pain in left hip 01/27/2021   Bruising 01/27/2021   Left-sided chest wall pain 03/23/2020   Positive ANA (antinuclear antibody) 03/23/2020   Asthma 02/10/2020   Allergic rhinitis 02/10/2020   GERD (gastroesophageal reflux disease) 02/10/2020   History of TB  (tuberculosis) 08/21/2019   Throat pain 01/28/2019   Odynophagia 01/28/2019   Neck pain 01/28/2019   Thyroiditis 01/12/2019   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/2018   Fatty liver 12/11/2018   PCOS (polycystic ovarian syndrome) 11/11/2018   Seasonal allergies 09/22/2018   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   Migraine with aura and without status migrainosus, not intractable 09/11/2012   Variants of migraine, not elsewhere classified, without mention of intractable migraine without mention of status migrainosus 09/11/2012   Unspecified constipation 09/11/2012   Syncope 08/31/2010   Neck Pain 08/31/2010   Insomnia 08/31/2010    PCP:   Billie Ruddy, MD     REFERRING PROVIDER: Vanetta Mulders, MD   REFERRING DIAG: [phone] (ICD-10-CM) - Tear of left acetabular labrum, initial encounter   THERAPY DIAG:  Pain in left hip  Difficulty walking  Muscle weakness (generalized)  Localized edema  Rationale for Evaluation and Treatment Rehabilitation  ONSET DATE: 03/20/22    1.  Left hip labral repair 2.  Left hip pincer debridement   Days since surgery: 93   SUBJECTIVE:   SUBJECTIVE STATEMENT:  Pt reports her hip has been "doing okay," although it remains stiff. She reports adherence to  her HEP.    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 Works for [employer] at [employer].   Pt states since surgery she has a lot of pain and has difficulty with the hip. Unsure of if brace on properly. Pt denies signs of infection. Pt has pain with movement. Pt is only 3 days out from surgery at this time. Pt denies NT.   PERTINENT HISTORY: N/A  PAIN:  Are you having pain? NO: NPRS scale: 0/10 Pain location: side and at incision Pain description: discomfort, achy Aggravating factors: movement Relieving factors: rest  PRECAUTIONS: Other: Hip  labral repair  WEIGHT BEARING RESTRICTIONS None  FALLS:  Has patient fallen in last 6 months? 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 Small focal cartilage defect at the superolateral acetabulum of the left hip. 2. Smooth cleft of contrast at the anterior chondrolabral junction is favored to represent a normal sublabral sulcus. No evidence of a labral tear. 3. Trace left peritrochanteric bursal fluid.  The cleft of contrast at the anterior chondrolabral junction of the left hip described in impression #2 may represent a normal sublabral sulcus. A partially detached labral tear could have a similar appearance in the appropriate clinical setting. Correlate with exam.    PATIENT SURVEYS:  FOTO 11 60 @ DC 19 pts MCII  66 FOTO 6th visit  LE ROM:  A/PROM Right 06/12/2022 Left 06/12/2022  Hip flexion 130/140 110/125, mild pain  Hip abduction 35/55 30/50  Hip internal rotation 35/60 25/40  Hip external rotation 35/40 32/52   (Blank rows = not tested)  LE MMT:  MMT Right 06/12/2022 Left 06/12/2022  Hip flexion 5/5 4/5p!  Hip extension 4/5 3+/5  Hip abduction 5/5 3+/5  Hip internal rotation 5/5 5/5p!  Hip external rotation 5/5 4/5   (Blank rows = not tested)    Functional tests:  06/12/2022: DL squat with two 10# kettlebells: Achieved x10    Forward lunge: x5 BIL with WNL depth   TODAY'S TREATMENT:  [facility] Adult PT Treatment:                                                DATE: 06/21/2022 Therapeutic Exercise: Squat hops at [employer] with two 7# cables to waist attachment 3x10 Sidelying IT band stretch x60mn BIL Thomas stretch x144m BIL Star plank with combined GTB row and hip flexion 2x10 BIL Hooklying piriformis stretch x1m43m BIL Cobra stretch x1mi6mrone superman with GTB hip abduction 3x10 Manual Therapy: N/A Neuromuscular re-ed: N/A Therapeutic Activity: N/A Modalities: N/A Self Care: N/A   [facility] Adult PT Treatment:                                                DATE: 06/12/2022 Therapeutic Exercise: 8-inch Lt step up with Rt knee drive with 10# 82#tlebell in Rt UE 2x10 Side steps  with high knees with RTB around thighs 2x4 laps of length of table Forward lunge to 6-inch box with RTB around thighs 2x10 BIL Manual Therapy: N/A Neuromuscular re-ed: N/A Therapeutic Activity: Re-assessment of objective measures for re-evaluation Modalities: N/A Self Care: N/A   11/28 Mulligan lateral and inf mob grade III; LAD with mulligan belt grade III  Gray leg press 10lbs 3x8 (up2 down1) Fwd and latreal step up 8" box 2x10 Bosu half squat 2x10 Full plank 15s 4x Bird dog 2x10    PATIENT EDUCATION:  Education details: anatomy, exercise progression, AD/brace usage, muscle firing,  envelope of function, HEP, POC  Person educated: Patient Education method: Explanation, Demonstration, Tactile cues, Verbal cues, and Handouts Education comprehension: verbalized understanding, returned demonstration, verbal cues required, and tactile cues required   HOME EXERCISE PROGRAM: Access Code: [email or web address] URL: [email or web address] Date: 03/23/2022 Prepared by: Daleen Bo  ASSESSMENT:  CLINICAL IMPRESSION: Pt responded excellently to all interventions today, demonstrating good form and no pain with selected exercises. She reports a therapeutic response to stretching today and will continue to benefit from skilled PT to address her primary impairments and return to her prior level of function with less limitation.   OBJECTIVE IMPAIRMENTS Abnormal gait, decreased activity tolerance, decreased balance, decreased endurance, decreased knowledge of use of DME, decreased mobility, difficulty walking,  decreased ROM, decreased strength, hypomobility, increased edema, increased fascial restrictions, increased muscle spasms, impaired flexibility, improper body mechanics, and pain.   ACTIVITY LIMITATIONS carrying, lifting, sitting, standing, squatting, sleeping, stairs, transfers, bed mobility, bathing, toileting, dressing, locomotion level, and caring for others  PARTICIPATION LIMITATIONS: meal prep, cleaning, laundry, interpersonal relationship, driving, shopping, community activity, occupation, yard work, and exercise  PERSONAL FACTORS Time since onset of injury/illness/exacerbation and 1-2 comorbidities:    are also affecting patient's functional outcome.     GOALS:   SHORT TERM GOALS: Target date: 05/04/22 Pt will become independent with HEP in order to demonstrate synthesis of PT education.   Goal status: MET  2.  Pt will be able to demonstrate normal gait with no AD and hip brace in order to demonstrate functional improvement in LE function for self-care and house hold duties.   Goal status: MET  3.  Pt will be able to demonstrate reciprocal stair step pattern with single UE support in order to demonstrate functional improvement in LE function for self-care and house hold duties.   Goal status: ongoing   LONG TERM GOALS: Target date:06/15/22  Pt  will become independent with final HEP in order to demonstrate synthesis of PT education.  Goal status: ongoing  2.  Pt will be able to demonstrate DL squat with >/=20lbs in order to demonstrate functional improvement in bilat LE strength for return to exercise.  06/12/2022: Achieved x10 Goal status: ACHIEVED  3.  Pt will be able to demonstrate full lunge on L LE in order to demonstrate functional improvement in LE function for improvement in L LE strength for return to exercise and PLOF.   06/12/2022: Achieved x5 BIL Goal status: ACHIEVED  4.  Pt will be able to demonstrate ability to agility ladder drills without pain in  order to demonstrate functional improvement and tolerance to low level plyometric loading.   Goal status: ongoing  5.  Pt will score >/= 60 on FOTO to demonstrate improvement in perceived L hip function.  Goal status: met  6.  *Added 06/12/2022* Pt will achieve global Lt hip MMT of 4+/5 or greater in order to progress her independent LE  strengthening regimen with less limitation.  Goal status: INITIAL  7.  *Added 06/12/2022* Pt will achieve Lt hip flexion AROM of 125 degrees or greater in order to normalize her functional gait.  Goal status: INITIAL   PLAN: PT FREQUENCY: 1x/week  PT DURATION: 8 weeks  PLANNED INTERVENTIONS: Therapeutic exercises, Therapeutic activity, Neuromuscular re-education, Balance training, Gait training, Patient/Family education, Self Care, Joint mobilization, Joint manipulation, Stair training, DME instructions, Aquatic Therapy, Dry Needling, Electrical stimulation, Wheelchair mobility training, Spinal manipulation, Spinal mobilization, Cryotherapy, Moist heat, scar mobilization, Splintting, Taping, Vasopneumatic device, Traction, Ultrasound, Ionotophoresis 53m/ml Dexamethasone, Manual therapy, and Re-evaluation  PLAN FOR NEXT SESSION: Progress LE loaded strengthening exercises with progression to plyometrics/ dynamic strengthening  YVanessa [place] PT, DPT 06/21/22 3:44 PM

## 2022-06-22 LAB — BASIC METABOLIC PANEL
BUN/Creatinine Ratio: 9 (ref 9–23)
BUN: 7 mg/dL (ref 6–20)
CO2: 25 mmol/L (ref 20–29)
Calcium: 9.9 mg/dL (ref 8.7–10.2)
Chloride: 102 mmol/L (ref 96–106)
Creatinine, Ser: 0.79 mg/dL (ref 0.57–1.00)
Glucose: 78 mg/dL (ref 70–99)
Potassium: 4 mmol/L (ref 3.5–5.2)
Sodium: 141 mmol/L (ref 134–144)
eGFR: 106 mL/min/{1.73_m2} (ref >59)

## 2022-06-22 LAB — TSH: TSH: 1.65 u[IU]/mL (ref 0.450–4.500)

## 2022-06-22 LAB — MAGNESIUM: Magnesium: 2.3 mg/dL (ref 1.6–2.3)

## 2022-06-23 ENCOUNTER — Ambulatory Visit: Payer: Commercial Managed Care - HMO

## 2022-06-28 ENCOUNTER — Encounter: Payer: Self-pay | Admitting: Family Medicine

## 2022-06-28 ENCOUNTER — Ambulatory Visit (INDEPENDENT_AMBULATORY_CARE_PROVIDER_SITE_OTHER): Payer: Commercial Managed Care - HMO | Admitting: Family Medicine

## 2022-06-28 ENCOUNTER — Other Ambulatory Visit (HOSPITAL_COMMUNITY)
Admission: RE | Admit: 2022-06-28 | Discharge: 2022-06-28 | Disposition: A | Discharge status: Home / Self Care | Payer: Commercial Managed Care - HMO | Source: Ambulatory Visit | Attending: Family Medicine | Admitting: Family Medicine

## 2022-06-28 VITALS — BP 119/84 | HR 60 | Wt 113.0 lb

## 2022-06-28 DIAGNOSIS — Z113 Encounter for screening for infections with a predominantly sexual mode of transmission: Secondary | ICD-10-CM | POA: Insufficient documentation

## 2022-06-28 DIAGNOSIS — E282 Polycystic ovarian syndrome: Secondary | ICD-10-CM | POA: Diagnosis not present

## 2022-06-28 DIAGNOSIS — I340 Nonrheumatic mitral (valve) insufficiency: Secondary | ICD-10-CM

## 2022-06-28 NOTE — Telephone Encounter (Signed)
Called and informed patient of provider switch approval. Patient added to Dr. Blenda Mounts waitlist.

## 2022-06-29 ENCOUNTER — Ambulatory Visit: Payer: Commercial Managed Care - HMO | Admitting: Physician Assistant

## 2022-06-29 NOTE — Progress Notes (Addendum)
   Subjective:    Patient ID: Maria Smith, female    DOB: January 29, 1996, 27 y.o.   MRN: [id number]  HPI Patient seen for irregular menses and STI check.  She has continued to have fluctuation of her menses. Per her menses tracker, her period interval is anywhere from 28 days to 49 days. Most of her menstrual interval is 4-5 weeks, but occasionally 7 week interval. Menses lasts 5-6 days and is heavy.  She and her wife are looking at getting pregnant and her being the gestational carrier. She doesn't think she is ovulating - took an ovulation tracker and did not have a positive line.    Review of Systems     Objective:   Physical Exam Vitals reviewed. Exam conducted with a chaperone present.  Constitutional:      Appearance: Normal appearance.  Genitourinary:    Exam position: Prone.     Labia:        Right: No rash, tenderness or lesion.        Left: No rash, tenderness or lesion.   Neurological:     Mental Status: She is alert.  Psychiatric:        Mood and Affect: Mood normal.        Behavior: Behavior normal.        Thought Content: Thought content normal.        Judgment: Judgment normal.       Assessment & Plan:  1. Screening for STD (sexually transmitted disease) - Cervicovaginal ancillary only( [place]) - HIV antibody (with reflex) - Hepatitis C Antibody - Hepatitis B Surface AntiGEN - RPR  2. PCOS (polycystic ovarian syndrome) Testing in the past did not demonstrate an elevated testosterone level.  At this point, I would refer her to [facility] for evaluation and for recommendations for treatments. She will call her insurance to see what REI specialist is in network for her.  3. Nonrheumatic mitral valve regurgitation Trivial regurgitation on Echo. Should not impact pregnancy.

## 2022-06-30 ENCOUNTER — Ambulatory Visit: Payer: Commercial Managed Care - HMO

## 2022-06-30 DIAGNOSIS — R600 Localized edema: Secondary | ICD-10-CM

## 2022-06-30 DIAGNOSIS — M25552 Pain in left hip: Secondary | ICD-10-CM | POA: Diagnosis not present

## 2022-06-30 DIAGNOSIS — M6281 Muscle weakness (generalized): Secondary | ICD-10-CM

## 2022-06-30 DIAGNOSIS — R262 Difficulty in walking, not elsewhere classified: Secondary | ICD-10-CM

## 2022-06-30 LAB — HEPATITIS B SURFACE ANTIGEN: Hepatitis B Surface Ag: NEGATIVE

## 2022-06-30 LAB — RPR: RPR Ser Ql: NONREACTIVE

## 2022-06-30 LAB — HEPATITIS C ANTIBODY: Hep C Virus Ab: NONREACTIVE

## 2022-06-30 LAB — HIV ANTIBODY (ROUTINE TESTING W REFLEX): HIV Screen 4th Generation wRfx: NONREACTIVE

## 2022-06-30 NOTE — Therapy (Signed)
OUTPATIENT PHYSICAL THERAPY LOWER EXTREMITY TREATMENT   Patient Name: Maria Smith MRN: [id number] DOB:07-23-1995, 27 y.o., female Today's Date: 06/30/2022   PT End of Session - 06/30/22 0817     Visit Number 12    Number of Visits 18    Date for PT Re-Evaluation 08/14/22    Authorization Type [employer]    PT Start Time 0818    PT Stop Time 0858    PT Time Calculation (min) 40 min    Activity Tolerance Patient tolerated treatment well    Behavior During Therapy [facility] for tasks assessed/performed                      Past Medical History:  Diagnosis Date   Allergic rhinitis    Allergy    SEASONAL   Asthma    Complication of anesthesia    Low BP   Eczema    Environmental allergies    Age 34 years   Family history of adverse reaction to anesthesia    Mother went into coma after anesthesia. 4 days to wake up   Fatty liver 12/11/2018   Noted on CT 6/20   Generalized headaches    Migraines. 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 Rifampin since 08/2015   Streptococcal infection(041.00)    Age 27 and 27 years old   TB lung, latent    2017   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/2018   Diffuse enlargement on [place] 6/20   Past Surgical History:  Procedure Laterality Date   ESOPHAGOGASTRODUODENOSCOPY ENDOSCOPY  2021   HIP ARTHROSCOPY Left 03/20/2022   Procedure: LEFT HIP ARTHROSCOPY WITH LABRAL REPAIR;  Surgeon: Huel Cote, MD;  Location: [facility] OR;  Service: Orthopedics;  Laterality: Left;   TONSILLECTOMY  06/18/2010   WISDOM TOOTH EXTRACTION     Patient Active Problem List   Diagnosis Date Noted   Tear of left acetabular labrum    Pain in left hip 01/27/2021   Bruising 01/27/2021   Left-sided chest wall pain 03/23/2020   Positive ANA (antinuclear antibody) 03/23/2020   Asthma 02/10/2020   Allergic rhinitis 02/10/2020   GERD (gastroesophageal reflux disease) 02/10/2020   History of TB  (tuberculosis) 08/21/2019   Throat pain 01/28/2019   Odynophagia 01/28/2019   Neck pain 01/28/2019   Thyroiditis 01/12/2019   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/2018   Fatty liver 12/11/2018   PCOS (polycystic ovarian syndrome) 11/11/2018   Seasonal allergies 09/22/2018   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   Migraine with aura and without status migrainosus, not intractable 09/11/2012   Variants of migraine, not elsewhere classified, without mention of intractable migraine without mention of status migrainosus 09/11/2012   Unspecified constipation 09/11/2012   Syncope 08/31/2010   Neck Pain 08/31/2010   Insomnia 08/31/2010    PCP:   Deeann Saint, MD     REFERRING PROVIDER: Huel Cote, MD   REFERRING DIAG: [phone] (ICD-10-CM) - Tear of left acetabular labrum, initial encounter   THERAPY DIAG:  Pain in left hip  Difficulty walking  Muscle weakness (generalized)  Localized edema  Rationale for Evaluation and Treatment Rehabilitation  ONSET DATE: 03/20/22    1.  Left hip labral repair 2.  Left hip pincer debridement   Days since surgery: 102   SUBJECTIVE:   SUBJECTIVE STATEMENT:  Pt reports she did not sleep well last night and is tired today. She reports  HEP adherence.    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 Works for [employer] at [employer].   Pt states since surgery she has a lot of pain and has difficulty with the hip. Unsure of if brace on properly. Pt denies signs of infection. Pt has pain with movement. Pt is only 3 days out from surgery at this time. Pt denies NT.   PERTINENT HISTORY: N/A  PAIN:  Are you having pain? NO: NPRS scale: 0/10 Pain location: side and at incision Pain description: discomfort, achy Aggravating factors: movement Relieving factors: rest  PRECAUTIONS: Other: Hip  labral repair  WEIGHT BEARING RESTRICTIONS None  FALLS:  Has patient fallen in last 6 months? 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 Small focal cartilage defect at the superolateral acetabulum of the left hip. 2. Smooth cleft of contrast at the anterior chondrolabral junction is favored to represent a normal sublabral sulcus. No evidence of a labral tear. 3. Trace left peritrochanteric bursal fluid.  The cleft of contrast at the anterior chondrolabral junction of the left hip described in impression #2 may represent a normal sublabral sulcus. A partially detached labral tear could have a similar appearance in the appropriate clinical setting. Correlate with exam.    PATIENT SURVEYS:  FOTO 11 60 @ DC 19 pts [facility]  66 FOTO 6th visit  LE ROM:  A/PROM Right 06/12/2022 Left 06/12/2022  Hip flexion 130/140 110/125, mild pain  Hip abduction 35/55 30/50  Hip internal rotation 35/60 25/40  Hip external rotation 35/40 32/52   (Blank rows = not tested)  LE MMT:  MMT Right 06/12/2022 Left 06/12/2022  Hip flexion 5/5 4/5p!  Hip extension 4/5 3+/5  Hip abduction 5/5 3+/5  Hip internal rotation 5/5 5/5p!  Hip external rotation 5/5 4/5   (Blank rows = not tested)    Functional tests:  06/12/2022: DL squat with two 10# kettlebells: Achieved x10    Forward lunge: x5 BIL with WNL depth   TODAY'S TREATMENT:  [facility] Adult PT Treatment:                                                DATE: 06/30/2022 Therapeutic Exercise: Marcello Moores stretch x16min on Lt Sidelying IT band stretch x82min on Lt Barbell deadlift x10 with 75#, x8 with 95#, x6 with 105# Walking lunges with 25# kettlebell 4x10 Kickstand stance Pallof press with 7# cable x10 with 5-sec hold  in both hip ER and hip IR BIL Manual Therapy: N/A Neuromuscular re-ed: N/A Therapeutic Activity: N/A Modalities: N/A Self Care: N/A   [facility] Adult PT Treatment:                                                DATE: 06/21/2022 Therapeutic Exercise: Squat hops at [employer] with two 7# cables to waist attachment 3x10 Sidelying IT band stretch x67min  BIL Thomas stretch x41min BIL Star plank with combined GTB row and hip flexion 2x10 BIL Hooklying piriformis stretch x27min BIL Cobra stretch x33min Prone superman with GTB hip abduction 3x10 Manual Therapy: N/A Neuromuscular re-ed: N/A Therapeutic Activity: N/A Modalities: N/A Self Care: N/A   [facility] Adult PT Treatment:                                                DATE: 06/12/2022 Therapeutic Exercise: 8-inch Lt step up with Rt knee drive with 98# kettlebell in Rt UE 2x10 Side steps with high knees with RTB around thighs 2x4 laps of length of table Forward lunge to 6-inch box with RTB around thighs 2x10 BIL Manual Therapy: N/A Neuromuscular re-ed: N/A Therapeutic Activity: Re-assessment of objective measures for re-evaluation Modalities: N/A Self Care: N/A    PATIENT EDUCATION:  Education details: anatomy, exercise progression, AD/brace usage, muscle firing,  envelope of function, HEP, POC  Person educated: Patient Education method: Explanation, Demonstration, Tactile cues, Verbal cues, and Handouts Education comprehension: verbalized understanding, returned demonstration, verbal cues required, and tactile cues required   HOME EXERCISE PROGRAM: Access Code: [email or web address] URL: [email or web address] Date: 03/23/2022 Prepared by: Daleen Bo  ASSESSMENT:  CLINICAL IMPRESSION: Pt continues to progress well with loaded hip strengthening. She reports mild fatigue and no pain at the end of the session. She will continue to benefit from skilled PT to address her primary impairments and return to her prior level of  function with less limitation.    OBJECTIVE IMPAIRMENTS Abnormal gait, decreased activity tolerance, decreased balance, decreased endurance, decreased knowledge of use of DME, decreased mobility, difficulty walking, decreased ROM, decreased strength, hypomobility, increased edema, increased fascial restrictions, increased muscle spasms, impaired flexibility, improper body mechanics, and pain.   ACTIVITY LIMITATIONS carrying, lifting, sitting, standing, squatting, sleeping, stairs, transfers, bed mobility, bathing, toileting, dressing, locomotion level, and caring for others  PARTICIPATION LIMITATIONS: meal prep, cleaning, laundry, interpersonal relationship, driving, shopping, community activity, occupation, yard work, and exercise  PERSONAL FACTORS Time since onset of injury/illness/exacerbation and 1-2 comorbidities:    are also affecting patient's functional outcome.     GOALS:   SHORT TERM GOALS: Target date: 05/04/22 Pt will become independent with HEP in order to demonstrate synthesis of PT education.   Goal status: MET  2.  Pt will be able to demonstrate normal gait with no AD and hip brace in order to demonstrate functional improvement in LE function for self-care and house hold duties.   Goal status: MET  3.  Pt will be able to demonstrate reciprocal stair step pattern with single UE support in order to demonstrate functional improvement in LE function for self-care and house hold duties.   Goal status: ongoing   LONG TERM GOALS: Target date:06/15/22  Pt  will become independent with final HEP in order to demonstrate synthesis of PT education.  Goal status: ongoing  2.  Pt will be able to demonstrate DL squat with >/=20lbs in order to demonstrate functional improvement in bilat LE strength for return to exercise.  06/12/2022: Achieved x10 Goal status: ACHIEVED  3.  Pt will be able to demonstrate full lunge on L LE in order to demonstrate functional improvement in  LE function for improvement in L LE strength for return to exercise and PLOF.   06/12/2022: Achieved x5 BIL Goal status: ACHIEVED  4.  Pt  will be able to demonstrate ability to agility ladder drills without pain in order to demonstrate functional improvement and tolerance to low level plyometric loading.   Goal status: ongoing  5.  Pt will score >/= 60 on FOTO to demonstrate improvement in perceived L hip function.  Goal status: met  6.  *Added 06/12/2022* Pt will achieve global Lt hip MMT of 4+/5 or greater in order to progress her independent LE strengthening regimen with less limitation.  Goal status: INITIAL  7.  *Added 06/12/2022* Pt will achieve Lt hip flexion AROM of 125 degrees or greater in order to normalize her functional gait.  Goal status: INITIAL   PLAN: PT FREQUENCY: 1x/week  PT DURATION: 8 weeks  PLANNED INTERVENTIONS: Therapeutic exercises, Therapeutic activity, Neuromuscular re-education, Balance training, Gait training, Patient/Family education, Self Care, Joint mobilization, Joint manipulation, Stair training, DME instructions, Aquatic Therapy, Dry Needling, Electrical stimulation, Wheelchair mobility training, Spinal manipulation, Spinal mobilization, Cryotherapy, Moist heat, scar mobilization, Splintting, Taping, Vasopneumatic device, Traction, Ultrasound, Ionotophoresis 4mg /ml Dexamethasone, Manual therapy, and Re-evaluation  PLAN FOR NEXT SESSION: Progress LE loaded strengthening exercises with progression to plyometrics/ dynamic strengthening  , PT, DPT 06/30/22 8:58 AM

## 2022-07-01 LAB — CERVICOVAGINAL ANCILLARY ONLY
Bacterial Vaginitis (gardnerella): NEGATIVE
Candida Glabrata: POSITIVE — AB
Candida Vaginitis: NEGATIVE
Chlamydia: NEGATIVE
Comment: NEGATIVE
Comment: NEGATIVE
Comment: NEGATIVE
Comment: NEGATIVE
Comment: NEGATIVE
Comment: NORMAL
Neisseria Gonorrhea: NEGATIVE
Trichomonas: NEGATIVE

## 2022-07-04 NOTE — Progress Notes (Signed)
Office Visit    Patient Name: Maria Smith Date of Encounter: 07/06/2022  PCP:  Martin[place], Betty G, MD   [place]  Cardiologist:  Fransico Him, MD  Advanced Practice Provider:  No care team member to display Electrophysiologist:  None   HPI    Maria Smith is a 27 y.o. female with a past medical history for headaches, seasonal allergies, asthma, latent TB, thyromegaly presents today for follow-up for palpitations.   She was seen for evaluation of palpitaitons 05/2022 by Dr, Radford Pax. She was seen remotely by Cardiology in 2020 for palpitations and chest pain that was non-cardiac. Heart monitor showed no arrythmias. 2D echo with normal LVEF.  The patient tolerated her chest but was very problematic at night but also occurs during the day.  She says it feels like fluttering or skipped beats.  She did endorse some pain in her chest but only occurs with certain movements and thinks it is musculoskeletal.  She noticed some DOE when walking short distances.  Recently had hip surgery for labral tear and noticed shortness of breath since then.  She was out of work for 6 weeks.  Drinks 2 cups of coffee daily no soda or tea.  I did try to change her diet but is concerned about her cholesterol because her mom had heart disease died of CVA.  Today, she states that she had worked out the morning of the event and then had chest pain that night.  Sharp in nature.  Heart rate was jumping around.  Eventually it stopped.  She tells me that she stays tender by her started up.  She recently had a positive ANA antibody but nobody is really been able to tell her what that means.  She states the palpitations last for about 10 to 15 seconds at a time.  They happen about 3 times a day.  She stopped drinking caffeine and does not drink alcohol.  She states that red wine made her feel weird in her chest so she has discontinued drinking this.  She shares with me today that she has very  irregular menstrual cycles and was told she has PCOS.  She would rather not to get on birth control and would like to try a natural route for controlling her menstrual cycle.  She is an avid weightlifter and frequently uses her chest muscles for different movements such as deadlifts.  I also described precordial catch syndrome to her as a possible diagnosis.  Blood pressure is low normal today.  Reports no shortness of breath nor dyspnea on exertion.  No edema, orthopnea, PND.    Past Medical History    Past Medical History:  Diagnosis Date   Allergic rhinitis    Allergy    SEASONAL   Asthma    Complication of anesthesia    Low BP   Eczema    Environmental allergies    Age 24 years   Family history of adverse reaction to anesthesia    Mother went into coma after anesthesia. 4 days to wake up   Fatty liver 12/11/2018   Noted on CT 6/20   Generalized headaches    Migraines. 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 Rifampin since 08/2015   Streptococcal infection(041.00)    Age 4 and 27 years old   TB lung, latent  2017   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/2018   Diffuse enlargement on [place] 6/20   Past Surgical History:  Procedure Laterality Date   ESOPHAGOGASTRODUODENOSCOPY ENDOSCOPY  2021   HIP ARTHROSCOPY Left 03/20/2022   Procedure: LEFT HIP ARTHROSCOPY WITH LABRAL REPAIR;  Surgeon: Huel Cote, MD;  Location: [facility] OR;  Service: Orthopedics;  Laterality: Left;   TONSILLECTOMY  06/18/2010   WISDOM TOOTH EXTRACTION      Allergies  No Known Allergies  EKGs/Labs/Other Studies Reviewed:   The following studies were reviewed today:  Long term monitor 06/12/22    Predominant rhythm was normal sinus rhythm with average heart rate 74bpm and ranged from 49 to 136bpm.   Rare PACs and PVCs.     Patch Wear Time:  3 days and 14 hours (2023-12-06T18:37:08-498 to 2023-12-10T08:43:41-0500)   Patient had a  min HR of 49 bpm, max HR of 136 bpm, and avg HR of 74 bpm. Predominant underlying rhythm was Sinus Rhythm. Isolated SVEs were rare (<1.0%), and no SVE Couplets or SVE Triplets were present. Isolated VEs were rare (<1.0%), and no VE Couplets  or VE Triplets were present.   Echo 06/12/22 IMPRESSIONS     1. Left ventricular ejection fraction, by estimation, is 55 to 60%. The  left ventricle has normal function. The left ventricle has no regional  wall motion abnormalities. Left ventricular diastolic parameters were  normal.   2. Right ventricular systolic function is normal. The right ventricular  size is normal. There is normal pulmonary artery systolic pressure. The  estimated right ventricular systolic pressure is 21.1 mmHg.   3. The mitral valve is normal in structure. Trivial mitral valve  regurgitation. No evidence of mitral stenosis.   4. The aortic valve is tricuspid. Aortic valve regurgitation is not  visualized. No aortic stenosis is present.   5. The inferior vena cava is normal in size with greater than 50%  respiratory variability, suggesting right atrial pressure of 3 mmHg.   Comparison(s): No significant change from prior study.   FINDINGS   Left Ventricle: Left ventricular ejection fraction, by estimation, is 55  to 60%. The left ventricle has normal function. The left ventricle has no  regional wall motion abnormalities. The left ventricular internal cavity  size was normal in size. There is   no left ventricular hypertrophy. Left ventricular diastolic parameters  were normal.   Right Ventricle: The right ventricular size is normal. No increase in  right ventricular wall thickness. Right ventricular systolic function is  normal. There is normal pulmonary artery systolic pressure. The tricuspid  regurgitant velocity is 2.13 m/s, and   with an assumed right atrial pressure of 3 mmHg, the estimated right  ventricular systolic pressure is 21.1 mmHg.   Left Atrium: Left  atrial size was normal in size.   Right Atrium: Right atrial size was normal in size.   Pericardium: There is no evidence of pericardial effusion.   Mitral Valve: The mitral valve is normal in structure. Trivial mitral  valve regurgitation. No evidence of mitral valve stenosis.   Tricuspid Valve: The tricuspid valve is normal in structure. Tricuspid  valve regurgitation is mild.   Aortic Valve: The aortic valve is tricuspid. Aortic valve regurgitation is  not visualized. No aortic stenosis is present.   Pulmonic Valve: The pulmonic valve was normal in structure. Pulmonic valve  regurgitation is mild.   Aorta: The aortic root and ascending aorta are structurally normal, with  no evidence of dilitation.   Venous: The inferior vena  cava is normal in size with greater than 50%  respiratory variability, suggesting right atrial pressure of 3 mmHg.   IAS/Shunts: The atrial septum is grossly normal.   EKG:  EKG is not ordered today.   Recent Labs: 03/05/2022: ALT 13; Hemoglobin 12.9; Platelets 361 06/21/2022: BUN 7; Creatinine, Ser 0.79; Magnesium 2.3; Potassium 4.0; Sodium 141; TSH 1.650  Recent Lipid Panel    Component Value Date/Time   CHOL 167 06/26/2017 1155   TRIG 32 06/26/2017 1155   HDL 94 06/26/2017 1155   CHOLHDL 1.8 06/26/2017 1155   LDLCALC 67 06/26/2017 1155    Home Medications   Current Meds  Medication Sig   acetaminophen (TYLENOL CHILDRENS) 160 MG/5ML suspension Take 31.3 mLs (1,000 mg total) by mouth every 8 (eight) hours as needed.   albuterol (VENTOLIN HFA) 108 (90 Base) MCG/ACT inhaler Inhale 2 puffs into the lungs every 6 (six) hours as needed for wheezing or shortness of breath.   ferrous sulfate 325 (65 FE) MG tablet Take 325 mg by mouth every other day.   fluticasone (FLONASE) 50 MCG/ACT nasal spray Place 2 sprays into both nostrils daily.   ibuprofen (ADVIL) 100 MG/5ML suspension Take 30 mLs (600 mg total) by mouth every 8 (eight) hours as needed.    influenza vac split quadrivalent PF (FLUARIX) 0.5 ML injection Inject into the muscle.   omega-3 acid ethyl esters (LOVAZA) 1 g capsule Take by mouth 2 (two) times daily.   Respiratory Therapy Supplies (NEBULIZER/TUBING/MOUTHPIECE) KIT Use as directed.   Vitamin D-Vitamin K (VITAMIN K2-VITAMIN D3 PO) Take 2 drops by mouth daily. Omega 3 2 Drops =5000 Units     Review of Systems      All other systems reviewed and are otherwise negative except as noted above.  Physical Exam    VS:  BP 102/64   Pulse 66   Ht 4\' 10"  (1.473 m)   Wt 113 lb (51.3 kg)   LMP 06/18/2022 (Exact Date)   SpO2 99%   BMI 23.62 kg/m  , BMI Body mass index is 23.62 kg/m.  Wt Readings from Last 3 Encounters:  07/06/22 113 lb (51.3 kg)  06/28/22 113 lb (51.3 kg)  06/06/22 112 lb 14.4 oz (51.2 kg)     GEN: Well nourished, well developed, in no acute distress. HEENT: normal. Neck: Supple, no JVD, carotid bruits, or masses. Cardiac: RRR, no murmurs, rubs, or gallops. No clubbing, cyanosis, edema.  Radials/PT 2+ and equal bilaterally.  Respiratory:  Respirations regular and unlabored, clear to auscultation bilaterally. GI: Soft, nontender, nondistended. MS: No deformity or atrophy. Skin: Warm and dry, no rash. Neuro:  Strength and sensation are intact. Psych: Normal affect.  Assessment & Plan    Palpitations -normal labs, normal TSH -unremarkable echo and zio monitor -avoid caffeine and alcohol -arranged follow-up with EP to discuss other options -not a candidate for CCB or BB due to low baseline HR and BP  Family history of vascular disease -echo and monitor reviewed -I think we can defer an ischemic workup for now -atypical chest pain does not seem cardiac and history points more towards MS -No other risk factors other than her family history  SOB -Normal LVEF on echo -seems to be associated with palpitations  4. Anxiety?  -would look into non-cardiac factors such as anxiety or hormonal  fluctuations for cause of palpitations.        Disposition: Follow up 2-3 months with Fransico Him, MD or APP.  Signed, Elgie Collard,  PA-C 07/06/2022, 1:16 PM [facility]

## 2022-07-05 ENCOUNTER — Ambulatory Visit: Payer: Commercial Managed Care - HMO

## 2022-07-06 ENCOUNTER — Encounter: Payer: Self-pay | Admitting: Physician Assistant

## 2022-07-06 ENCOUNTER — Ambulatory Visit: Payer: Commercial Managed Care - HMO | Attending: Physician Assistant | Admitting: Physician Assistant

## 2022-07-06 VITALS — BP 102/64 | HR 66 | Ht <= 58 in | Wt 113.0 lb

## 2022-07-06 DIAGNOSIS — R0602 Shortness of breath: Secondary | ICD-10-CM

## 2022-07-06 DIAGNOSIS — R002 Palpitations: Secondary | ICD-10-CM

## 2022-07-06 DIAGNOSIS — Z8249 Family history of ischemic heart disease and other diseases of the circulatory system: Secondary | ICD-10-CM

## 2022-07-06 NOTE — Patient Instructions (Addendum)
Medication Instructions:  Your physician recommends that you continue on your current medications as directed. Please refer to the Current Medication list given to you today.  *If you need a refill on your cardiac medications before your next appointment, please call your pharmacy*   Lab Work: None ordered If you have labs (blood work) drawn today and your tests are completely normal, you will receive your results only by: [place] (if you have MyChart) OR A paper copy in the mail If you have any lab test that is abnormal or we need to change your treatment, we will call you to review the results.   Follow-Up: At [facility], you and your health needs are our priority.  As part of our continuing mission to provide you with exceptional heart care, we have created designated Provider Care Teams.  These Care Teams include your primary Cardiologist (physician) and Advanced Practice Providers (APPs -  Physician Assistants and Nurse Practitioners) who all work together to provide you with the care you need, when you need it.   Your next appointment:   3 month(s)  Provider:   Fransico Him, MD    Other Instructions 1.You have been referred to see an electrophysiologist here in our office 2.Continue to limit caffeine, avoid alcohol and get in 64 oz of water daily 3.Call and schedule follow up with your primary care provider and gynecologist

## 2022-07-08 ENCOUNTER — Ambulatory Visit (HOSPITAL_COMMUNITY)
Admission: EM | Admit: 2022-07-08 | Discharge: 2022-07-08 | Disposition: A | Discharge status: Home / Self Care | Payer: Commercial Managed Care - HMO | Attending: Internal Medicine | Admitting: Internal Medicine

## 2022-07-08 ENCOUNTER — Encounter (HOSPITAL_COMMUNITY): Payer: Self-pay

## 2022-07-08 DIAGNOSIS — R0602 Shortness of breath: Secondary | ICD-10-CM

## 2022-07-08 DIAGNOSIS — J069 Acute upper respiratory infection, unspecified: Secondary | ICD-10-CM

## 2022-07-08 DIAGNOSIS — J4540 Moderate persistent asthma, uncomplicated: Secondary | ICD-10-CM | POA: Diagnosis present

## 2022-07-08 DIAGNOSIS — Z1152 Encounter for screening for COVID-19: Secondary | ICD-10-CM

## 2022-07-08 DIAGNOSIS — R509 Fever, unspecified: Secondary | ICD-10-CM

## 2022-07-08 DIAGNOSIS — J029 Acute pharyngitis, unspecified: Secondary | ICD-10-CM | POA: Diagnosis not present

## 2022-07-08 HISTORY — DX: Nonrheumatic mitral (valve) insufficiency: I34.0

## 2022-07-08 LAB — POC INFLUENZA A AND B ANTIGEN (URGENT CARE ONLY)
INFLUENZA A ANTIGEN, POC: NEGATIVE
INFLUENZA B ANTIGEN, POC: NEGATIVE

## 2022-07-08 LAB — POCT RAPID STREP A, ED / UC: Streptococcus, Group A Screen (Direct): NEGATIVE

## 2022-07-08 MED ORDER — ALBUTEROL SULFATE (2.5 MG/3ML) 0.083% IN NEBU
2.5000 mg | INHALATION_SOLUTION | Freq: Once | RESPIRATORY_TRACT | Status: AC
  Administered 2022-07-08: 2.5 mg via RESPIRATORY_TRACT

## 2022-07-08 MED ORDER — ALBUTEROL SULFATE HFA 108 (90 BASE) MCG/ACT IN AERS: 2.0000 | INHALATION_SPRAY | Freq: Four times a day (QID) | RESPIRATORY_TRACT | 2 refills | Status: DC | PRN

## 2022-07-08 MED ORDER — DM-GUAIFENESIN ER 30-600 MG PO TB12: 1.0000 | ORAL_TABLET | Freq: Two times a day (BID) | ORAL | 0 refills | Status: DC

## 2022-07-08 MED ORDER — ALBUTEROL SULFATE (2.5 MG/3ML) 0.083% IN NEBU
INHALATION_SOLUTION | RESPIRATORY_TRACT | Status: AC
  Filled 2022-07-08: qty 3

## 2022-07-08 NOTE — Discharge Instructions (Addendum)
Advised to continue to use OTC cough preparations to control cough and congestion. Advised to use ibuprofen or Tylenol as needed for fever, body aches or pain. Advised to use the albuterol inhaler, 2 puffs every 6 hours on a regular basis to help decrease shortness of breath.  COVID test will be completed in 48 hours.  If you do not get a call from this office that indicates the test is negative.  Log onto MyChart to be the test results with post in 48 hours.  Advised to follow-up with PCP or return to urgent care if symptoms fail to improve within the next 3 to 5 days.

## 2022-07-08 NOTE — ED Provider Notes (Signed)
[place]    CSN: [id number] Arrival date & time: 07/08/22  1001      History   Chief Complaint Chief Complaint  Patient presents with   Nasal Congestion   Cough   Fever   Sore Throat    HPI Maria Smith is a 27 y.o. female.   27 year old female presents with fatigue, fever, cough.  Patient indicates that her twin daughters have been sick with cough and congestion.  She indicates she started having symptoms 3 days ago.  She indicates she has been having some upper respiratory congestion with rhinitis and postnasal drip which we have been clear.  She has been having cough, chest congestion, shortness of breath with the congestion.  She relates she has not been wheezing.  She indicates she has a history of asthma and has been using her albuterol inhaler with minimal relief.  She indicates that she has been running fever of 102 for the past 3 days and indicates this morning was when the fever broke.  She indicates she has been having fatigue, lethargy chills and sweats associated with her symptoms.  She has no nausea or vomiting.  She is concerned that she has flu and desires to be tested, she also request to be tested for COVID.  She is tolerating fluids well.   Cough Associated symptoms: chills, fever and shortness of breath   Fever Associated symptoms: chills and cough   Sore Throat Associated symptoms include shortness of breath.    Past Medical History:  Diagnosis Date   Allergic rhinitis    Allergy    SEASONAL   Asthma    Complication of anesthesia    Low BP   Eczema    Environmental allergies    Age 10 years   Family history of adverse reaction to anesthesia    Mother went into coma after anesthesia. 4 days to wake up   Fatty liver 12/11/2018   Noted on CT 6/20   Generalized headaches    Migraines. 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    Taking Rifampin since 08/2015   Streptococcal infection(041.00)    Age 53 and 27 years old   TB lung, latent    2017   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/2018   Diffuse enlargement on [place] 6/20    Patient Active Problem List   Diagnosis Date Noted   Tear of left acetabular labrum    Pain in left hip 01/27/2021   Bruising 01/27/2021   Left-sided chest wall pain 03/23/2020   Positive ANA (antinuclear antibody) 03/23/2020   Asthma 02/10/2020   Allergic rhinitis 02/10/2020   GERD (gastroesophageal reflux disease) 02/10/2020   History of TB (tuberculosis) 08/21/2019   Throat pain 01/28/2019   Odynophagia 01/28/2019   Neck pain 01/28/2019   Thyroiditis 01/12/2019   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/2018   Fatty liver 12/11/2018   PCOS (polycystic ovarian syndrome) 11/11/2018   Seasonal allergies 09/22/2018   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   Migraine with aura and without status migrainosus, not intractable 09/11/2012   Variants of migraine, not elsewhere classified, without mention of intractable migraine without mention of status migrainosus 09/11/2012   Unspecified constipation 09/11/2012   Syncope 08/31/2010   Neck Pain 08/31/2010   Insomnia 08/31/2010    Past Surgical History:  Procedure Laterality Date   ESOPHAGOGASTRODUODENOSCOPY ENDOSCOPY  2021   HIP ARTHROSCOPY  Left 03/20/2022   Procedure: LEFT HIP ARTHROSCOPY WITH LABRAL REPAIR;  Surgeon: Huel Cote, MD;  Location: [facility] OR;  Service: Orthopedics;  Laterality: Left;   TONSILLECTOMY  06/18/2010   WISDOM TOOTH EXTRACTION      OB History     Gravida  1   Para  0   Term  0   Preterm  0   AB  0   Living  0      SAB  0   IAB  0   Ectopic  0   Multiple  0   Live Births  0            Home Medications    Prior to Admission medications   Medication Sig Start Date End Date Taking? Authorizing Provider  dextromethorphan-guaiFENesin (MUCINEX DM) 30-600 MG 12hr tablet Take 1 tablet by mouth 2 (two) times daily. 07/08/22  Yes Ellsworth Lennox, PA-C  acetaminophen (TYLENOL CHILDRENS) 160 MG/5ML suspension Take 31.3 mLs (1,000 mg total) by mouth every 8 (eight) hours as needed. 02/11/22   Raspet, Noberto Retort, PA-C  albuterol (VENTOLIN HFA) 108 (90 Base) MCG/ACT inhaler Inhale 2 puffs into the lungs every 6 (six) hours as needed for wheezing or shortness of breath. 07/08/22   Ellsworth Lennox, PA-C  ferrous sulfate 325 (65 FE) MG tablet Take 325 mg by mouth every other day.    [provider]  fluticasone (FLONASE) 50 MCG/ACT nasal spray Place 2 sprays into both nostrils daily. 03/27/22   [place], Betty G, MD  ibuprofen (ADVIL) 100 MG/5ML suspension Take 30 mLs (600 mg total) by mouth every 8 (eight) hours as needed. 02/11/22   Raspet, Denny Peon K, PA-C  influenza vac split quadrivalent PF (FLUARIX) 0.5 ML injection Inject into the muscle. 03/23/22   Judyann Munson, MD  omega-3 acid ethyl esters (LOVAZA) 1 g capsule Take by mouth 2 (two) times daily.    [provider]  Respiratory Therapy Supplies (NEBULIZER/TUBING/MOUTHPIECE) KIT Use as directed. 04/24/21   Deeann Saint, MD  Vitamin D-Vitamin K (VITAMIN K2-VITAMIN D3 PO) Take 2 drops by mouth daily. Omega 3 2 Drops =5000 Units    [provider]  levocetirizine (XYZAL) 5 MG tablet TAKE 1 TABLET BY MOUTH EVERY DAY IN THE EVENING 04/15/19 10/23/19  Deeann Saint, MD    Family History Family History  Problem Relation Age of Onset   Migraines Paternal Grandmother    Migraines Paternal Grandfather    HIV Father    Diabetes Mother    Hypertension Mother    Anemia Mother    COPD Mother    CVA Mother    Congestive Heart Failure Mother    Cirrhosis Mother    Obesity Mother    Irritable bowel syndrome Mother    Rheum arthritis Mother    Ulcers Brother    Anemia Sister    Cholecystitis Brother    Migraines Paternal Aunt    Migraines Maternal Uncle        Maternal Great Uncle   Diabetes Other        Family History   Hypertension Other        Family History    Depression Other        Family History   Asthma Other        Family History   Allergic rhinitis Other        Family History   Asthma Brother    Colon cancer Neg Hx    Colon  polyps Neg Hx    Esophageal cancer Neg Hx    Rectal cancer Neg Hx    Stomach cancer Neg Hx     Social History Social History   Tobacco Use   Smoking status: Never   Smokeless tobacco: Never  Vaping Use   Vaping Use: Never used  Substance Use Topics   Alcohol use: Not Currently   Drug use: No     Allergies   Patient has no known allergies.   Review of Systems Review of Systems  Constitutional:  Positive for chills, fatigue and fever.  Respiratory:  Positive for cough and shortness of breath.      Physical Exam Triage Vital Signs ED Triage Vitals  Enc Vitals Group     BP 07/08/22 1008 113/79     Pulse Rate 07/08/22 1008 86     Resp 07/08/22 1008 16     Temp 07/08/22 1008 98.7 F (37.1 C)     Temp Source 07/08/22 1008 Oral     SpO2 07/08/22 1008 97 %     Weight --      Height --      Head Circumference --      Peak Flow --      Pain Score 07/08/22 1010 6     Pain Loc --      Pain Edu? --      Excl. in [place]? --    No data found.  Updated Vital Signs BP 113/79 (BP Location: Right Arm)   Pulse 86   Temp 98.7 F (37.1 C) (Oral)   Resp 16   LMP 06/18/2022   SpO2 97%   Visual Acuity Right Eye Distance:   Left Eye Distance:   Bilateral Distance:    Right Eye Near:   Left Eye Near:    Bilateral Near:     Physical Exam Constitutional:      Appearance: She is well-developed.  HENT:     Right Ear: Tympanic membrane and ear canal normal.     Left Ear: Tympanic membrane and ear canal normal.     Mouth/Throat:     Mouth: Mucous membranes are moist.     Pharynx: Oropharynx is clear.  Cardiovascular:     Rate and Rhythm: Normal rate and regular rhythm.     Heart sounds: Normal heart sounds.  Pulmonary:     Effort: Pulmonary effort is normal.     Breath sounds: Normal breath  sounds and air entry. No wheezing, rhonchi or rales.  Lymphadenopathy:     Cervical: No cervical adenopathy.  Neurological:     Mental Status: She is alert.      UC Treatments / Results  Labs (all labs ordered are listed, but only abnormal results are displayed) Labs Reviewed  SARS CORONAVIRUS 2 (TAT 6-24 HRS)  CULTURE, GROUP A STREP [facility])  POCT RAPID STREP A, ED / UC  POC INFLUENZA A AND B ANTIGEN (URGENT CARE ONLY)    EKG   Radiology No results found.  Procedures Procedures (including critical care time)  Medications Ordered in UC Medications  albuterol (PROVENTIL) (2.5 MG/3ML) 0.083% nebulizer solution 2.5 mg (2.5 mg Nebulization Given 07/08/22 1039)    Initial Impression / Assessment and Plan / UC Course  I have reviewed the triage vital signs and the nursing notes.  Pertinent labs & imaging results that were available during my care of the patient were reviewed by me and considered in my medical decision making (see chart for details).  Plan: The diagnoses will be treated with the following: 1.  Upper respiratory infection: A.  Advised to use OTC cough preparations to control cough and congestion. 2.  Fever: A.  Advised to use Tylenol or ibuprofen to control fever. 3.  Shortness of breath: A.  Albuterol nebulizer treatment 0.083% given in the office today. B.  Albuterol inhaler, 2 puffs every 6 hours on a regular basis to decrease shortness of breath and control congestion and cough. 4.  Screening for COVID-19: A.  Treatment may be modified depending on COVID-19 results. 5.  Sore throat: A.  Ibuprofen or Tylenol to control the pain from sore throat B.  Throat culture pending. 6.  Patient advised follow-up PCP or return to urgent care if symptoms fail to improve. Final Clinical Impressions(s) / UC Diagnoses   Final diagnoses:  Viral upper respiratory tract infection  Fever, unspecified  Encounter for screening for COVID-19  SOB (shortness of breath)   Sore throat     Discharge Instructions      Advised to continue to use OTC cough preparations to control cough and congestion. Advised to use ibuprofen or Tylenol as needed for fever, body aches or pain. Advised to use the albuterol inhaler, 2 puffs every 6 hours on a regular basis to help decrease shortness of breath.  COVID test will be completed in 48 hours.  If you do not get a call from this office that indicates the test is negative.  Log onto MyChart to be the test results with post in 48 hours.  Advised to follow-up with PCP or return to urgent care if symptoms fail to improve within the next 3 to 5 days.    ED Prescriptions     Medication Sig Dispense Auth. Provider   albuterol (VENTOLIN HFA) 108 (90 Base) MCG/ACT inhaler Inhale 2 puffs into the lungs every 6 (six) hours as needed for wheezing or shortness of breath. 8 g Ellsworth Lennox, PA-C   dextromethorphan-guaiFENesin [facility] DM) 30-600 MG 12hr tablet Take 1 tablet by mouth 2 (two) times daily. 20 tablet Ellsworth Lennox, PA-C      PDMP not reviewed this encounter.   Ellsworth Lennox, PA-C 07/08/22 1104

## 2022-07-08 NOTE — ED Triage Notes (Signed)
Patient reports that she has had a non productive cough, nasal congestion, sore throat and fever x 36 hours.  Patient states she tool Dayquil yesterday and Nyquil at night with very little relief.

## 2022-07-09 ENCOUNTER — Encounter: Payer: Self-pay | Admitting: Family Medicine

## 2022-07-09 LAB — SARS CORONAVIRUS 2 (TAT 6-24 HRS): SARS Coronavirus 2: POSITIVE — AB

## 2022-07-11 LAB — CULTURE, GROUP A STREP (THRC)

## 2022-07-12 ENCOUNTER — Ambulatory Visit: Payer: Commercial Managed Care - HMO

## 2022-07-25 NOTE — Therapy (Signed)
OUTPATIENT PHYSICAL THERAPY LOWER EXTREMITY TREATMENT   Patient Name: Maria Smith MRN: [id number] DOB:[birth year], 27 y.o., female Today's Date: 07/26/2022   PT End of Session - 07/26/22 0847     Visit Number 13    Number of Visits 18    Date for PT Re-Evaluation 08/14/22    Authorization Type [employer]    PT Start Time 0847    PT Stop Time 0928    PT Time Calculation (min) 41 min    Activity Tolerance Patient tolerated treatment well    Behavior During Therapy [facility] for tasks assessed/performed                      Past Medical History:  Diagnosis Date   Allergic rhinitis    Allergy    SEASONAL   Asthma    Complication of anesthesia    Low BP   Eczema    Environmental allergies    Age 67 years   Family history of adverse reaction to anesthesia    Mother went into coma after anesthesia. 4 days to wake up   Fatty liver 12/11/2018   Noted on CT 6/20   Generalized headaches    Migraines. 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 Rifampin since 08/2015   Streptococcal infection(041.00)    Age 60 and 27 years old   TB lung, latent    2017   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/2018   Diffuse enlargement on [place] 6/20   Past Surgical History:  Procedure Laterality Date   ESOPHAGOGASTRODUODENOSCOPY ENDOSCOPY  2021   HIP ARTHROSCOPY Left 03/20/2022   Procedure: LEFT HIP ARTHROSCOPY WITH LABRAL REPAIR;  Surgeon: Vanetta Mulders, MD;  Location: [place];  Service: Orthopedics;  Laterality: Left;   TONSILLECTOMY  06/18/2010   WISDOM TOOTH EXTRACTION     Patient Active Problem List   Diagnosis Date Noted   Tear of left acetabular labrum    Pain in left hip 01/27/2021   Bruising 01/27/2021   Left-sided chest wall pain 03/23/2020   Positive ANA (antinuclear antibody) 03/23/2020   Asthma 02/10/2020   Allergic rhinitis 02/10/2020   GERD (gastroesophageal reflux disease)  02/10/2020   History of TB (tuberculosis) 08/21/2019   Throat pain 01/28/2019   Odynophagia 01/28/2019   Neck pain 01/28/2019   Thyroiditis 01/12/2019   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/2018   Fatty liver 12/11/2018   PCOS (polycystic ovarian syndrome) 11/11/2018   Seasonal allergies 09/22/2018   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   Migraine with aura and without status migrainosus, not intractable 09/11/2012   Variants of migraine, not elsewhere classified, without mention of intractable migraine without mention of status migrainosus 09/11/2012   Unspecified constipation 09/11/2012   Syncope 08/31/2010   Neck Pain 08/31/2010   Insomnia 08/31/2010    PCP:   Billie Ruddy, MD     REFERRING PROVIDER: Vanetta Mulders, MD   REFERRING DIAG: [phone] (ICD-10-CM) - Tear of left acetabular labrum, initial encounter   THERAPY DIAG:  Pain in left hip  Difficulty walking  Muscle weakness (generalized)  Rationale for Evaluation and Treatment Rehabilitation  ONSET DATE: 03/20/22    1.  Left hip labral repair 2.  Left hip pincer debridement   Days since surgery: 128   SUBJECTIVE:   SUBJECTIVE STATEMENT:  Patient reports the hip isn't painful, but feels tight in the front.  PERTINENT HISTORY: N/A  PAIN:  Are you having pain? NO  PRECAUTIONS: Other: Hip  labral repair  WEIGHT BEARING RESTRICTIONS None  FALLS:  Has patient fallen in last 6 months? 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 Small focal cartilage defect at the superolateral acetabulum of the left hip. 2. Smooth cleft of contrast at the anterior chondrolabral junction is favored to  represent a normal sublabral sulcus. No evidence of a labral tear. 3. Trace left peritrochanteric bursal fluid.  The cleft of contrast at the anterior chondrolabral junction of the left hip described in impression #2 may represent a normal sublabral sulcus. A partially detached labral tear could have a similar appearance in the appropriate clinical setting. Correlate with exam.    PATIENT SURVEYS:  FOTO 11 60 @ DC 19 pts [facility]II  66 FOTO 6th visit  LE ROM:  A/PROM Right 06/12/2022 Left 06/12/2022  Hip flexion 130/140 110/125, mild pain  Hip abduction 35/55 30/50  Hip internal rotation 35/60 25/40  Hip external rotation 35/40 32/52   (Blank rows = not tested)  LE MMT:  MMT Right 06/12/2022 Left 06/12/2022 07/26/22  Hip flexion 5/5 4/5p!   Hip extension 4/5 3+/5   Hip abduction 5/5 3+/5   Hip internal rotation 5/5 5/5p!   Hip external rotation 5/5 4/5    (Blank rows = not tested)    Functional tests:  06/12/2022: DL squat with two 56# kettlebells: Achieved x10    Forward lunge: x5 BIL with WNL depth   TODAY'S TREATMENT: [facility] Adult PT Treatment:                                                DATE: 07/26/22 Therapeutic Exercise: Elliptical level 3 x 5 minutes  Supine TA march 2 x 10  Supine bent knee fallout 2 x 10  SL bridge 2 x 10  Kneeling hip flexor stretch x 30 sec  Sidelying hip circles 1 x 10; CW/CCW Updated HEP Manual Therapy: IASTM to Lt hip flexor/quads Gentle LAD LLE    [facility] Adult PT Treatment:                                                DATE: 06/30/2022 Therapeutic Exercise: Maisie Fus stretch x57min on Lt Sidelying IT band stretch x39min on Lt Barbell deadlift x10 with 75#, x8 with 95#, x6 with 105# Walking lunges with 25# kettlebell 4x10 Kickstand stance Pallof press with 7# cable x10 with 5-sec hold in both hip ER and hip IR BIL Manual Therapy: N/A Neuromuscular re-ed: N/A Therapeutic Activity: N/A Modalities: N/A Self  Care: N/A   [facility] Adult PT Treatment:  DATE: 06/21/2022 Therapeutic Exercise: Squat hops at [employer] with two 7# cables to waist attachment 3x10 Sidelying IT band stretch x19min BIL Thomas stretch x85min BIL Star plank with combined GTB row and hip flexion 2x10 BIL Hooklying piriformis stretch x32min BIL Cobra stretch x82min Prone superman with GTB hip abduction 3x10 Manual Therapy: N/A Neuromuscular re-ed: N/A Therapeutic Activity: N/A Modalities: N/A Self Care: N/A     PATIENT EDUCATION:  Education details: HEP Person educated: Patient Education method: Consulting civil engineer, Media planner, Corporate treasurer cues, Verbal cues, and Handouts Education comprehension: verbalized understanding, returned demonstration, verbal cues required, and tactile cues required   HOME EXERCISE PROGRAM: Access Code: [email or web address]   ASSESSMENT:  CLINICAL IMPRESSION: Patient tolerated session well today focusing on core stabilization and manual techniques to address patients complaints of tightness. She is noted to have tautness and palpable tenderness about Lt hip flexor. She will potentially benefit from TPDN at future sessions if tautness remains. She quickly fatigues with supine dynamic core stabilization having initial difficulty controlling for anterior pelvic tilt. No reports of hip pain throughout session.    OBJECTIVE IMPAIRMENTS Abnormal gait, decreased activity tolerance, decreased balance, decreased endurance, decreased knowledge of use of DME, decreased mobility, difficulty walking, decreased ROM, decreased strength, hypomobility, increased edema, increased fascial restrictions, increased muscle spasms, impaired flexibility, improper body mechanics, and pain.   ACTIVITY LIMITATIONS carrying, lifting, sitting, standing, squatting, sleeping, stairs, transfers, bed mobility, bathing, toileting, dressing, locomotion level, and caring for others  PARTICIPATION  LIMITATIONS: meal prep, cleaning, laundry, interpersonal relationship, driving, shopping, community activity, occupation, yard work, and exercise  PERSONAL FACTORS Time since onset of injury/illness/exacerbation and 1-2 comorbidities:    are also affecting patient's functional outcome.     GOALS:   SHORT TERM GOALS: Target date: 05/04/22 Pt will become independent with HEP in order to demonstrate synthesis of PT education.   Goal status: MET  2.  Pt will be able to demonstrate normal gait with no AD and hip brace in order to demonstrate functional improvement in LE function for self-care and house hold duties.   Goal status: MET  3.  Pt will be able to demonstrate reciprocal stair step pattern with single UE support in order to demonstrate functional improvement in LE function for self-care and house hold duties.   Goal status: ongoing   LONG TERM GOALS: Target date:06/15/22  Pt  will become independent with final HEP in order to demonstrate synthesis of PT education.  Goal status: ongoing  2.  Pt will be able to demonstrate DL squat with >/=20lbs in order to demonstrate functional improvement in bilat LE strength for return to exercise.  06/12/2022: Achieved x10 Goal status: ACHIEVED  3.  Pt will be able to demonstrate full lunge on L LE in order to demonstrate functional improvement in LE function for improvement in L LE strength for return to exercise and PLOF.   06/12/2022: Achieved x5 BIL Goal status: ACHIEVED  4.  Pt will be able to demonstrate ability to agility ladder drills without pain in order to demonstrate functional improvement and tolerance to low level plyometric loading.   Goal status: ongoing  5.  Pt will score >/= 60 on FOTO to demonstrate improvement in perceived L hip function.  Goal status: met  6.  *Added 06/12/2022* Pt will achieve global Lt hip MMT of 4+/5 or greater in order to progress her independent LE strengthening regimen with less  limitation.  Goal status: INITIAL  7.  *Added 06/12/2022* Pt will achieve Lt hip flexion  AROM of 125 degrees or greater in order to normalize her functional gait.  Goal status: INITIAL   PLAN: PT FREQUENCY: 1x/week  PT DURATION: 8 weeks  PLANNED INTERVENTIONS: Therapeutic exercises, Therapeutic activity, Neuromuscular re-education, Balance training, Gait training, Patient/Family education, Self Care, Joint mobilization, Joint manipulation, Stair training, DME instructions, Aquatic Therapy, Dry Needling, Electrical stimulation, Wheelchair mobility training, Spinal manipulation, Spinal mobilization, Cryotherapy, Moist heat, scar mobilization, Splintting, Taping, Vasopneumatic device, Traction, Ultrasound, Ionotophoresis 4mg /ml Dexamethasone, Manual therapy, and Re-evaluation  PLAN FOR NEXT SESSION: core/hip strengthening   Gwendolyn Grant, PT, DPT, ATC 07/26/22 9:29 AM

## 2022-07-26 ENCOUNTER — Ambulatory Visit: Payer: Commercial Managed Care - HMO | Attending: Family Medicine

## 2022-07-26 DIAGNOSIS — M25552 Pain in left hip: Secondary | ICD-10-CM | POA: Insufficient documentation

## 2022-07-26 DIAGNOSIS — M6281 Muscle weakness (generalized): Secondary | ICD-10-CM | POA: Diagnosis present

## 2022-07-26 DIAGNOSIS — R262 Difficulty in walking, not elsewhere classified: Secondary | ICD-10-CM | POA: Insufficient documentation

## 2022-07-30 NOTE — Therapy (Signed)
OUTPATIENT PHYSICAL THERAPY LOWER EXTREMITY TREATMENT   Patient Name: Maria Smith MRN: [id number] DOB:16-May-1996, 27 y.o., female Today's Date: 07/31/2022   PT End of Session - 07/31/22 0844     Visit Number 14    Number of Visits 18    Date for PT Re-Evaluation 08/14/22    Authorization Type [employer]    PT Start Time 0845    PT Stop Time 0928    PT Time Calculation (min) 43 min    Activity Tolerance Patient tolerated treatment well    Behavior During Therapy [facility] for tasks assessed/performed                       Past Medical History:  Diagnosis Date   Allergic rhinitis    Allergy    SEASONAL   Asthma    Complication of anesthesia    Low BP   Eczema    Environmental allergies    Age 53 years   Family history of adverse reaction to anesthesia    Mother went into coma after anesthesia. 4 days to wake up   Fatty liver 12/11/2018   Noted on CT 6/20   Generalized headaches    Migraines. 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 Rifampin since 08/2015   Streptococcal infection(041.00)    Age 42 and 27 years old   TB lung, latent    2017   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/2018   Diffuse enlargement on [place] 6/20   Past Surgical History:  Procedure Laterality Date   ESOPHAGOGASTRODUODENOSCOPY ENDOSCOPY  2021   HIP ARTHROSCOPY Left 03/20/2022   Procedure: LEFT HIP ARTHROSCOPY WITH LABRAL REPAIR;  Surgeon: Vanetta Mulders, MD;  Location: [facility];  Service: Orthopedics;  Laterality: Left;   TONSILLECTOMY  06/18/2010   WISDOM TOOTH EXTRACTION     Patient Active Problem List   Diagnosis Date Noted   Tear of left acetabular labrum    Pain in left hip 01/27/2021   Bruising 01/27/2021   Left-sided chest wall pain 03/23/2020   Positive ANA (antinuclear antibody) 03/23/2020   Asthma 02/10/2020   Allergic rhinitis 02/10/2020   GERD (gastroesophageal reflux  disease) 02/10/2020   History of TB (tuberculosis) 08/21/2019   Throat pain 01/28/2019   Odynophagia 01/28/2019   Neck pain 01/28/2019   Thyroiditis 01/12/2019   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/2018   Fatty liver 12/11/2018   PCOS (polycystic ovarian syndrome) 11/11/2018   Seasonal allergies 09/22/2018   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   Migraine with aura and without status migrainosus, not intractable 09/11/2012   Variants of migraine, not elsewhere classified, without mention of intractable migraine without mention of status migrainosus 09/11/2012   Unspecified constipation 09/11/2012   Syncope 08/31/2010   Neck Pain 08/31/2010   Insomnia 08/31/2010    PCP:   Billie Ruddy, MD     REFERRING PROVIDER: Vanetta Mulders, MD   REFERRING DIAG: [phone] (ICD-10-CM) - Tear of left acetabular labrum, initial encounter   THERAPY DIAG:  Pain in left hip  Difficulty walking  Muscle weakness (generalized)  Rationale for Evaluation and Treatment Rehabilitation  ONSET DATE: 03/20/22    1.  Left hip labral repair 2.  Left hip pincer debridement   Days since surgery: 133   SUBJECTIVE:   SUBJECTIVE STATEMENT:  Patient reports the hip is tight, but not painful. She reports  some of the exercises prescribed last week are getting easier.      PERTINENT HISTORY: N/A  PAIN:  Are you having pain? NO  PRECAUTIONS: Other: Hip  labral repair  WEIGHT BEARING RESTRICTIONS None  FALLS:  Has patient fallen in last 6 months? 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 Small focal cartilage defect at the superolateral acetabulum of the left hip. 2. Smooth cleft  of contrast at the anterior chondrolabral junction is favored to represent a normal sublabral sulcus. No evidence of a labral tear. 3. Trace left peritrochanteric bursal fluid.  The cleft of contrast at the anterior chondrolabral junction of the left hip described in impression #2 may represent a normal sublabral sulcus. A partially detached labral tear could have a similar appearance in the appropriate clinical setting. Correlate with exam.    PATIENT SURVEYS:  FOTO 11 60 @ DC 19 pts MCII  66 FOTO 6th visit  LE ROM:  A/PROM Right 06/12/2022 Left 06/12/2022  Hip flexion 130/140 110/125, mild pain  Hip abduction 35/55 30/50  Hip internal rotation 35/60 25/40  Hip external rotation 35/40 32/52   (Blank rows = not tested)  LE MMT:  MMT Right 06/12/2022 Left 06/12/2022 07/31/22  Hip flexion 5/5 4/5p!   Hip extension 4/5 3+/5 4/5 bilateral  Hip abduction 5/5 3+/5   Hip internal rotation 5/5 5/5p!   Hip external rotation 5/5 4/5    (Blank rows = not tested)    Functional tests:  06/12/2022: DL squat with two 10# kettlebells: Achieved x10    Forward lunge: x5 BIL with WNL depth   TODAY'S TREATMENT: [facility] Adult PT Treatment:                                                DATE: 07/31/22 Therapeutic Exercise: Elliptical level 3 grade 6 x 5 minutes  Lunge hip flexor stretch x 60 seconds  Foam rolling hip flexor/IT band Modified 100 3 x 30  90/90 toe tap 2 x 10  Clamshells 2 x 10 black band  Fire hydrant 2 x 10  Sidelying leg taps x 10  Updated HEP Manual Therapy: LLE LAD DTM proximal quadriceps, hip flexor Trigger Point Dry Needling Treatment: Pre-treatment instruction: Patient instructed on dry needling rationale, procedures, and possible side effects including pain during treatment (achy,cramping feeling), bruising, drop of blood, lightheadedness, nausea, sweating. Patient Consent Given: Yes Education handout provided: Yes Muscles treated: Lt rectus femoris,  vastus lateralis   Needle size and number: .30x80m x 1 Treatment response/outcome: Twitch response elicited and Palpable decrease in muscle tension Post-treatment instructions: Patient instructed to expect possible mild to moderate muscle soreness later today and/or tomorrow. Patient instructed in methods to reduce muscle soreness and to continue prescribed HEP. If patient was dry needled over the lung field, patient was instructed on signs and symptoms of pneumothorax and, however unlikely, to see immediate medical attention should they occur. Patient was also educated on signs and symptoms of infection and to seek medical attention should they occur. Patient verbalized understanding of these  instructions and education.     [facility] Adult PT Treatment:                                                DATE: 07/26/22 Therapeutic Exercise: Elliptical level 3 x 5 minutes  Supine TA march 2 x 10  Supine bent knee fallout 2 x 10  SL bridge 2 x 10  Kneeling hip flexor stretch x 30 sec  Sidelying hip circles 1 x 10; CW/CCW Updated HEP Manual Therapy: IASTM to Lt hip flexor/quads Gentle LAD LLE    [facility] Adult PT Treatment:                                                DATE: 06/30/2022 Therapeutic Exercise: Marcello Moores stretch x59mn on Lt Sidelying IT band stretch x261m on Lt Barbell deadlift x10 with 75#, x8 with 95#, x6 with 105# Walking lunges with 25# kettlebell 4x10 Kickstand stance Pallof press with 7# cable x10 with 5-sec hold in both hip ER and hip IR BIL Manual Therapy: N/A Neuromuscular re-ed: N/A Therapeutic Activity: N/A Modalities: N/A Self Care: N/A     PATIENT EDUCATION:  Education details: HEP; TPDN Person educated: Patient Education method: Explanation, Demonstration, Tactile cues, Verbal cues, and Handouts Education comprehension: verbalized understanding, returned demonstration, verbal cues required, and tactile cues required   HOME EXERCISE PROGRAM: Access Code:  [email or web address]   ASSESSMENT:  CLINICAL IMPRESSION: Patient tolerated session well today continuing with progression of dynamic core stabilization and hip strengthening. TPDN was performed to proximal quadriceps with excellent twitch response noted and soreness reported post-intervention as expected. She demonstrates good TA activation with supine core progression, but quickly fatigues. HEP was updated to include further core and hip strengthening. No reports of pain throughout session.    OBJECTIVE IMPAIRMENTS Abnormal gait, decreased activity tolerance, decreased balance, decreased endurance, decreased knowledge of use of DME, decreased mobility, difficulty walking, decreased ROM, decreased strength, hypomobility, increased edema, increased fascial restrictions, increased muscle spasms, impaired flexibility, improper body mechanics, and pain.   ACTIVITY LIMITATIONS carrying, lifting, sitting, standing, squatting, sleeping, stairs, transfers, bed mobility, bathing, toileting, dressing, locomotion level, and caring for others  PARTICIPATION LIMITATIONS: meal prep, cleaning, laundry, interpersonal relationship, driving, shopping, community activity, occupation, yard work, and exercise  PERSONAL FACTORS Time since onset of injury/illness/exacerbation and 1-2 comorbidities:    are also affecting patient's functional outcome.     GOALS:   SHORT TERM GOALS: Target date: 05/04/22 Pt will become independent with HEP in order to demonstrate synthesis of PT education.   Goal status: MET  2.  Pt will be able to demonstrate normal gait with no AD and hip brace in order to demonstrate functional improvement in LE function for self-care and house hold duties.   Goal status: MET  3.  Pt will be able to demonstrate reciprocal stair step pattern with single UE support in order to demonstrate functional improvement in LE function for self-care and house hold duties.   Goal status: ongoing   LONG TERM  GOALS: Target date:06/15/22  Pt  will become independent with final HEP in order to demonstrate synthesis of PT education.  Goal status: ongoing  2.  Pt will be able to demonstrate DL squat  with >/=20lbs in order to demonstrate functional improvement in bilat LE strength for return to exercise.  06/12/2022: Achieved x10 Goal status: ACHIEVED  3.  Pt will be able to demonstrate full lunge on L LE in order to demonstrate functional improvement in LE function for improvement in L LE strength for return to exercise and PLOF.   06/12/2022: Achieved x5 BIL Goal status: ACHIEVED  4.  Pt will be able to demonstrate ability to agility ladder drills without pain in order to demonstrate functional improvement and tolerance to low level plyometric loading.   Goal status: ongoing  5.  Pt will score >/= 60 on FOTO to demonstrate improvement in perceived L hip function.  Goal status: met  6.  *Added 06/12/2022* Pt will achieve global Lt hip MMT of 4+/5 or greater in order to progress her independent LE strengthening regimen with less limitation.  Goal status: INITIAL  7.  *Added 06/12/2022* Pt will achieve Lt hip flexion AROM of 125 degrees or greater in order to normalize her functional gait.  Goal status: INITIAL   PLAN: PT FREQUENCY: 1x/week  PT DURATION: 8 weeks  PLANNED INTERVENTIONS: Therapeutic exercises, Therapeutic activity, Neuromuscular re-education, Balance training, Gait training, Patient/Family education, Self Care, Joint mobilization, Joint manipulation, Stair training, DME instructions, Aquatic Therapy, Dry Needling, Electrical stimulation, Wheelchair mobility training, Spinal manipulation, Spinal mobilization, Cryotherapy, Moist heat, scar mobilization, Splintting, Taping, Vasopneumatic device, Traction, Ultrasound, Ionotophoresis 80m/ml Dexamethasone, Manual therapy, and Re-evaluation  PLAN FOR NEXT SESSION: core/hip strengthening   SGwendolyn Grant PT, DPT, ATC 07/31/22 9:29  AM

## 2022-07-31 ENCOUNTER — Ambulatory Visit: Payer: Commercial Managed Care - HMO

## 2022-07-31 DIAGNOSIS — M6281 Muscle weakness (generalized): Secondary | ICD-10-CM

## 2022-07-31 DIAGNOSIS — M25552 Pain in left hip: Secondary | ICD-10-CM

## 2022-07-31 DIAGNOSIS — R262 Difficulty in walking, not elsewhere classified: Secondary | ICD-10-CM

## 2022-07-31 NOTE — Patient Instructions (Signed)

## 2022-08-03 ENCOUNTER — Ambulatory Visit (HOSPITAL_BASED_OUTPATIENT_CLINIC_OR_DEPARTMENT_OTHER): Payer: Commercial Managed Care - HMO | Admitting: Orthopaedic Surgery

## 2022-08-09 ENCOUNTER — Ambulatory Visit: Payer: Commercial Managed Care - HMO

## 2022-08-09 DIAGNOSIS — M25552 Pain in left hip: Secondary | ICD-10-CM

## 2022-08-09 DIAGNOSIS — R262 Difficulty in walking, not elsewhere classified: Secondary | ICD-10-CM

## 2022-08-09 DIAGNOSIS — M6281 Muscle weakness (generalized): Secondary | ICD-10-CM

## 2022-08-09 NOTE — Therapy (Signed)
OUTPATIENT PHYSICAL THERAPY LOWER EXTREMITY TREATMENT  PHYSICAL THERAPY DISCHARGE SUMMARY  Visits from Start of Care: 15  Current functional level related to goals / functional outcomes: See goals below   Remaining deficits: Mild gluteal weakness    Education / Equipment: See education below    Patient agrees to discharge. Patient goals were partially met. Patient is being discharged due to being pleased with the current functional level.  Patient Name: Maria Smith MRN: [id number] DOB:[birth year], 27 y.o., female Today's Date: 08/09/2022   PT End of Session - 08/09/22 0844     Visit Number 15    Number of Visits 18    Date for PT Re-Evaluation 08/14/22    Authorization Type [employer]    PT Start Time 0845    PT Stop Time 0929    PT Time Calculation (min) 44 min    Activity Tolerance Patient tolerated treatment well    Behavior During Therapy [facility] for tasks assessed/performed                        Past Medical History:  Diagnosis Date   Allergic rhinitis    Allergy    SEASONAL   Asthma    Complication of anesthesia    Low BP   Eczema    Environmental allergies    Age 65 years   Family history of adverse reaction to anesthesia    Mother went into coma after anesthesia. 4 days to wake up   Fatty liver 12/11/2018   Noted on CT 6/20   Generalized headaches    Migraines. 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 Rifampin since 08/2015   Streptococcal infection(041.00)    Age 58 and 27 years old   TB lung, latent    2017   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/2018   Diffuse enlargement on [place] 6/20   Past Surgical History:  Procedure Laterality Date   ESOPHAGOGASTRODUODENOSCOPY ENDOSCOPY  2021   HIP ARTHROSCOPY Left 03/20/2022   Procedure: LEFT HIP ARTHROSCOPY WITH LABRAL REPAIR;  Surgeon: Vanetta Mulders, MD;  Location: [place];  Service: Orthopedics;   Laterality: Left;   TONSILLECTOMY  06/18/2010   WISDOM TOOTH EXTRACTION     Patient Active Problem List   Diagnosis Date Noted   Tear of left acetabular labrum    Pain in left hip 01/27/2021   Bruising 01/27/2021   Left-sided chest wall pain 03/23/2020   Positive ANA (antinuclear antibody) 03/23/2020   Asthma 02/10/2020   Allergic rhinitis 02/10/2020   GERD (gastroesophageal reflux disease) 02/10/2020   History of TB (tuberculosis) 08/21/2019   Throat pain 01/28/2019   Odynophagia 01/28/2019   Neck pain 01/28/2019   Thyroiditis 01/12/2019   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/2018   Fatty liver 12/11/2018   PCOS (polycystic ovarian syndrome) 11/11/2018   Seasonal allergies 09/22/2018   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   Migraine with aura and without status migrainosus, not intractable 09/11/2012   Variants of migraine, not elsewhere classified, without mention of intractable migraine without mention of status migrainosus 09/11/2012   Unspecified constipation 09/11/2012   Syncope 08/31/2010   Neck Pain 08/31/2010   Insomnia 08/31/2010    PCP:   Billie Ruddy, MD     REFERRING PROVIDER: Vanetta Mulders, MD   REFERRING DIAG: [phone] (ICD-10-CM) - Tear of left acetabular labrum, initial encounter  THERAPY DIAG:  Pain in left hip  Difficulty walking  Muscle weakness (generalized)  Rationale for Evaluation and Treatment Rehabilitation  ONSET DATE: 03/20/22    1.  Left hip labral repair 2.  Left hip pincer debridement   Days since surgery: 142   SUBJECTIVE:   SUBJECTIVE STATEMENT:  Patient reports the hip is less tight. She feels like she is ready for discharge and can continue her strengthening independently.      PERTINENT HISTORY: N/A  PAIN:  Are you having pain? NO  PRECAUTIONS: Other: Hip  labral repair  WEIGHT BEARING RESTRICTIONS None  FALLS:  Has patient fallen in last 6 months? 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 Small focal cartilage defect at the superolateral acetabulum of the left hip. 2. Smooth cleft of contrast at the anterior chondrolabral junction is favored to represent a normal sublabral sulcus. No evidence of a labral tear. 3. Trace left peritrochanteric bursal fluid.  The cleft of contrast at the anterior chondrolabral junction of the left hip described in impression #2 may represent a normal sublabral sulcus. A partially detached labral tear could have a similar appearance in the appropriate clinical setting. Correlate with exam.    PATIENT SURVEYS:  FOTO 11 60 @ DC 19 pts MCII  66 FOTO 6th visit  LE ROM:  A/PROM Right 06/12/2022 Left 06/12/2022 08/09/22 Left   Hip flexion 130/140 110/125, mild pain 112  Hip abduction 35/55 30/50   Hip internal rotation 35/60 25/40   Hip external rotation 35/40 32/52    (Blank rows = not tested)  LE MMT:  MMT Right 06/12/2022 Left 06/12/2022 07/31/22 08/09/22 Left   Hip flexion 5/5 4/5p!  5/5  Hip extension 4/5 3+/5 4/5 bilateral 4/5  Hip abduction 5/5 3+/5  4/5  Hip internal rotation 5/5 5/5p!  5/5  Hip external rotation 5/5 4/5  5/5   (Blank rows = not tested)    Functional tests:  06/12/2022: DL squat with two 10# kettlebells: Achieved x10    Forward lunge: x5 BIL with WNL depth   TODAY'S TREATMENT: [facility] Adult PT Treatment:                                                DATE: 08/09/22 Therapeutic Exercise: Elliptical level 3, ramp 4 x 5 minutes  Agility ladder drills Fire hydrant x 10 Donkey kick x 10 90/90 toe tap x 10  Sidelying hip taps x 10  Reviewed and updated HEP demonstrating and returning demo as needed;  discussing frequency, sets, reps, resistance, and ways to progress independently. (See HEP below)   Therapeutic Activity: Re-assessment to determine overall progress, educating patient on progress towards goals.     [facility] Adult PT Treatment:                                                DATE: 07/31/22 Therapeutic Exercise: Elliptical level 3  grade 6 x 5 minutes  Lunge hip flexor stretch x 60 seconds  Foam rolling hip flexor/IT band Modified 100 3 x 30  90/90 toe tap 2 x 10  Clamshells 2 x 10 black band  Fire hydrant 2 x 10  Sidelying leg taps x 10  Updated HEP Manual Therapy: LLE LAD DTM proximal quadriceps, hip flexor Trigger Point Dry Needling Treatment: Pre-treatment instruction: Patient instructed on dry needling rationale, procedures, and possible side effects including pain during treatment (achy,cramping feeling), bruising, drop of blood, lightheadedness, nausea, sweating. Patient Consent Given: Yes Education handout provided: Yes Muscles treated: Lt rectus femoris, vastus lateralis   Needle size and number: .30x25m x 1 Treatment response/outcome: Twitch response elicited and Palpable decrease in muscle tension Post-treatment instructions: Patient instructed to expect possible mild to moderate muscle soreness later today and/or tomorrow. Patient instructed in methods to reduce muscle soreness and to continue prescribed HEP. If patient was dry needled over the lung field, patient was instructed on signs and symptoms of pneumothorax and, however unlikely, to see immediate medical attention should they occur. Patient was also educated on signs and symptoms of infection and to seek medical attention should they occur. Patient verbalized understanding of these instructions and education.     [facility]:                                                DATE: 07/26/22 Therapeutic Exercise: Elliptical level 3 x 5 minutes  Supine TA march 2 x 10  Supine bent knee fallout 2  x 10  SL bridge 2 x 10  Kneeling hip flexor stretch x 30 sec  Sidelying hip circles 1 x 10; CW/CCW Updated HEP Manual Therapy: IASTM to Lt hip flexor/quads Gentle LAD LLE     PATIENT EDUCATION:  Education details: see treatment; d/c education  Person educated: Patient Education method: Explanation, Demonstration, Tactile cues, Verbal cues, and Handouts Education comprehension: verbalized understanding and returned demonstration   HOME EXERCISE PROGRAM: Access Code: [email or web address] URL: [email or web address] Date: 08/09/2022 Prepared by: SGwendolyn Grant Exercises - Butterfly Groin Stretch  - 2 x daily - 7 x weekly - 1 sets - 3 reps - 30 hold - Half Kneeling Hip Flexor Stretch  - 1 x daily - 7 x weekly - 3 sets - 30 sec  hold - Side Stepping with Resistance at Ankles  - 1 x daily - 2 x weekly - 2 sets - 1 reps - 15 ft hold - Sidelying Hip Circles  - 1 x daily - 2 x weekly - 2 sets - 10 reps - Clamshell with Resistance  - 1 x daily - 2 x weekly - 2 sets - 10 reps - Sidelying Diagonal Hip Abduction  - 1 x daily - 2 x weekly - 2 sets - 10 reps - Single Leg Bridge  - 1 x daily - 2 x weekly - 2 sets - 10 reps - Bird Dog  - 1 x daily - 2 x weekly - 2 sets - 10 reps - Quadruped Hip Extension with Mini Swiss Ball  - 1 x daily - 2 x weekly - 2 sets - 10 reps - Quadruped Hip Abduction and External Rotation  - 1 x daily - 2 x weekly - 2 sets - 10 reps - Supine 90/90 Alternating Heel Touches with Posterior Pelvic Tilt  -  1 x daily - 2 x weekly - 2 sets - 10 reps - The Hundred 3 Intermediate - Table Top  - 1 x daily - 2 x weekly - 3 sets - 30 sec hold - Supine 90/90 with Leg Extensions  - 1 x daily - 2 x weekly - 2 sets - 10 reps - Supine Dead Bug with Leg Extension  - 1 x daily - 2 x weekly - 2 sets - 10 reps - Goblet Squat with Kettlebell  - 1 x daily - 2 x weekly - 2 sets - 10 reps   ASSESSMENT:  CLINICAL IMPRESSION: Ashleyn has progressed well s/p Lt labral repair on 03/20/22  reporting no limitations with her workout or daily activities at this time. She has met the majority of established functional goals with exception of hip strength and hip flexion AROM. She has mild weakness remaining in hip extensors and abductors, but demonstrates independence with advanced home program where she can continue to progress her gluteal strength. She demonstrates functional AROM about the Lt hip. She feels that she is ready for discharge and can continue to progress her strength and mobility independently.    OBJECTIVE IMPAIRMENTS Abnormal gait, decreased activity tolerance, decreased balance, decreased endurance, decreased knowledge of use of DME, decreased mobility, difficulty walking, decreased ROM, decreased strength, hypomobility, increased edema, increased fascial restrictions, increased muscle spasms, impaired flexibility, improper body mechanics, and pain.   ACTIVITY LIMITATIONS carrying, lifting, sitting, standing, squatting, sleeping, stairs, transfers, bed mobility, bathing, toileting, dressing, locomotion level, and caring for others  PARTICIPATION LIMITATIONS: meal prep, cleaning, laundry, interpersonal relationship, driving, shopping, community activity, occupation, yard work, and exercise  PERSONAL FACTORS Time since onset of injury/illness/exacerbation and 1-2 comorbidities:    are also affecting patient's functional outcome.     GOALS:   SHORT TERM GOALS: Target date: 05/04/22 Pt will become independent with HEP in order to demonstrate synthesis of PT education.   Goal status: MET  2.  Pt will be able to demonstrate normal gait with no AD and hip brace in order to demonstrate functional improvement in LE function for self-care and house hold duties.   Goal status: MET  3.  Pt will be able to demonstrate reciprocal stair step pattern with single UE support in order to demonstrate functional improvement in LE function for self-care and house hold  duties.  08/09/22: able to ascend/descend stairs reciprocally without UE support  Goal status: MET   LONG TERM GOALS: Target date:06/15/22  Pt  will become independent with final HEP in order to demonstrate synthesis of PT education.  Goal status: MET  2.  Pt will be able to demonstrate DL squat with >/=20lbs in order to demonstrate functional improvement in bilat LE strength for return to exercise.  06/12/2022: Achieved x10 Goal status: MET  3.  Pt will be able to demonstrate full lunge on L LE in order to demonstrate functional improvement in LE function for improvement in L LE strength for return to exercise and PLOF.   06/12/2022: Achieved x5 BIL Goal status: MET  4.  Pt will be able to demonstrate ability to agility ladder drills without pain in order to demonstrate functional improvement and tolerance to low level plyometric loading.   Goal status: MET  5.  Pt will score >/= 60 on FOTO to demonstrate improvement in perceived L hip function.  Goal status: MET  6.  *Added 06/12/2022* Pt will achieve global Lt hip MMT of 4+/5 or greater in order  to progress her independent LE strengthening regimen with less limitation.  Goal status: PARTIALLY MET  7.  *Added 06/12/2022* Pt will achieve Lt hip flexion AROM of 125 degrees or greater in order to normalize her functional gait.  Goal status: NOT MET   PLAN: PT FREQUENCY:N/A  PT DURATION: N/A  PLANNED INTERVENTIONS: Therapeutic exercises, Therapeutic activity, Neuromuscular re-education, Balance training, Gait training, Patient/Family education, Self Care, Joint mobilization, Joint manipulation, Stair training, DME instructions, Aquatic Therapy, Dry Needling, Electrical stimulation, Wheelchair mobility training, Spinal manipulation, Spinal mobilization, Cryotherapy, Moist heat, scar mobilization, Splintting, Taping, Vasopneumatic device, Traction, Ultrasound, Ionotophoresis 3m/ml Dexamethasone, Manual therapy, and  Re-evaluation  PLAN FOR NEXT SESSION: N/A  SGwendolyn Grant PT, DPT, ATC 08/09/22 9:36 AM

## 2022-08-15 ENCOUNTER — Ambulatory Visit (HOSPITAL_BASED_OUTPATIENT_CLINIC_OR_DEPARTMENT_OTHER): Payer: Commercial Managed Care - HMO | Admitting: Orthopaedic Surgery

## 2022-08-16 ENCOUNTER — Other Ambulatory Visit: Payer: Self-pay

## 2022-08-16 DIAGNOSIS — Z3169 Encounter for other general counseling and advice on procreation: Secondary | ICD-10-CM

## 2022-08-18 ENCOUNTER — Other Ambulatory Visit (HOSPITAL_COMMUNITY): Payer: Self-pay

## 2022-08-18 ENCOUNTER — Encounter (HOSPITAL_COMMUNITY): Payer: Self-pay | Admitting: Emergency Medicine

## 2022-08-18 ENCOUNTER — Ambulatory Visit (HOSPITAL_COMMUNITY)
Admission: EM | Admit: 2022-08-18 | Discharge: 2022-08-18 | Disposition: A | Discharge status: Home / Self Care | Payer: Commercial Managed Care - HMO | Attending: Internal Medicine | Admitting: Internal Medicine

## 2022-08-18 DIAGNOSIS — J4540 Moderate persistent asthma, uncomplicated: Secondary | ICD-10-CM

## 2022-08-18 DIAGNOSIS — J069 Acute upper respiratory infection, unspecified: Secondary | ICD-10-CM | POA: Diagnosis not present

## 2022-08-18 DIAGNOSIS — J4520 Mild intermittent asthma, uncomplicated: Secondary | ICD-10-CM

## 2022-08-18 MED ORDER — BENZONATATE 100 MG PO CAPS
100.0000 mg | ORAL_CAPSULE | Freq: Three times a day (TID) | ORAL | 0 refills | Status: DC
  Filled 2022-08-18: qty 21, 7d supply, fill #0

## 2022-08-18 MED ORDER — PROMETHAZINE-DM 6.25-15 MG/5ML PO SYRP
5.0000 mL | ORAL_SOLUTION | Freq: Every evening | ORAL | 0 refills | Status: DC | PRN
  Filled 2022-08-18: qty 118, 23d supply, fill #0

## 2022-08-18 MED ORDER — ALBUTEROL SULFATE HFA 108 (90 BASE) MCG/ACT IN AERS
2.0000 | INHALATION_SPRAY | Freq: Four times a day (QID) | RESPIRATORY_TRACT | 2 refills | Status: DC | PRN
  Filled 2022-08-18: qty 6.7, 25d supply, fill #0

## 2022-08-18 NOTE — ED Provider Notes (Signed)
[place]    CSN: [id number] Arrival date & time: 08/18/22  1003      History   Chief Complaint Chief Complaint  Patient presents with   Cough   Nasal Congestion    HPI Maria Smith is a 27 y.o. female.   Patient presents to urgent care for evaluation of cough, nasal congestion, sore throat, generalized fatigue, and generalized body aches that started 2-3 days ago. States her wife was sick with similar symptoms 4 days ago but has since improved.  History of asthma, reports chest tightness with coughing that is relieved with as needed use of albuterol inhaler.  She denies shortness of breath, chest pain, heart palpitations, nausea, vomiting, abdominal pain, and dizziness.  No fever or chills reported.  She did recently have COVID-19 in January 2024.  Denies recent steroid or antibiotic use.  Non-smoker, denies drug use.  No rash.  Taking over-the-counter medications with some relief of symptoms.   Cough   Past Medical History:  Diagnosis Date   Allergic rhinitis    Allergy    SEASONAL   Asthma    Complication of anesthesia    Low BP   Eczema    Environmental allergies    Age 74 years   Family history of adverse reaction to anesthesia    Mother went into coma after anesthesia. 4 days to wake up   Fatty liver 12/11/2018   Noted on CT 6/20   Generalized headaches    Migraines. 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/2018   Diffuse enlargement on [place] 6/20    Patient Active Problem List   Diagnosis Date Noted   Tear of left acetabular labrum    Pain in left hip 01/27/2021   Bruising 01/27/2021   Left-sided chest wall pain 03/23/2020   Positive ANA (antinuclear antibody) 03/23/2020   Asthma 02/10/2020    Allergic rhinitis 02/10/2020   GERD (gastroesophageal reflux disease) 02/10/2020   History of TB (tuberculosis) 08/21/2019   Throat pain 01/28/2019   Odynophagia 01/28/2019   Neck pain 01/28/2019   Thyroiditis 01/12/2019   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/2018   Fatty liver 12/11/2018   PCOS (polycystic ovarian syndrome) 11/11/2018   Seasonal allergies 09/22/2018   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   Migraine with aura and without status migrainosus, not intractable 09/11/2012   Variants of migraine, not elsewhere classified, without mention of intractable migraine without mention of status migrainosus 09/11/2012   Unspecified constipation 09/11/2012   Syncope 08/31/2010   Neck Pain 08/31/2010   Insomnia 08/31/2010    Past Surgical History:  Procedure Laterality Date   ESOPHAGOGASTRODUODENOSCOPY ENDOSCOPY  2021   HIP ARTHROSCOPY Left 03/20/2022   Procedure: LEFT HIP ARTHROSCOPY WITH LABRAL REPAIR;  Surgeon: Vanetta Mulders, MD;  Location: [facility];  Service: Orthopedics;  Laterality: Left;   TONSILLECTOMY  06/18/2010   WISDOM TOOTH EXTRACTION      OB History     Gravida  1   Para  0   Term  0   Preterm  0   AB  0   Living  0      SAB  0   IAB  0  Ectopic  0   Multiple  0   Live Births  0            Home Medications    Prior to Admission medications   Medication Sig Start Date End Date Taking? Authorizing Provider  benzonatate (TESSALON) 100 MG capsule Take 1 capsule (100 mg total) by mouth every 8 (eight) hours. 08/18/22  Yes Talbot Grumbling, FNP  promethazine-dextromethorphan (PROMETHAZINE-DM) 6.25-15 MG/5ML syrup Take 5 mLs by mouth at bedtime as needed for cough. 08/18/22  Yes Talbot Grumbling, FNP  acetaminophen (TYLENOL CHILDRENS) 160 MG/5ML suspension Take 31.3 mLs (1,000 mg total) by mouth every 8 (eight) hours as needed. 02/11/22   Raspet, Derry Skill, PA-C  albuterol (VENTOLIN HFA) 108 (90 Base) MCG/ACT inhaler Inhale 2 puffs into the lungs every 6 (six) hours as  needed for wheezing or shortness of breath. 08/18/22   Talbot Grumbling, FNP  dextromethorphan-guaiFENesin (MUCINEX DM) 30-600 MG 12hr tablet Take 1 tablet by mouth 2 (two) times daily. 07/08/22   Nyoka Lint, PA-C  ferrous sulfate 325 (65 FE) MG tablet Take 325 mg by mouth every other day.    [provider]  fluticasone (FLONASE) 50 MCG/ACT nasal spray Place 2 sprays into both nostrils daily. 03/27/22   [place], Betty G, MD  ibuprofen (ADVIL) 100 MG/5ML suspension Take 30 mLs (600 mg total) by mouth every 8 (eight) hours as needed. 02/11/22   Raspet, Junie Panning K, PA-C  influenza vac split quadrivalent PF (FLUARIX) 0.5 ML injection Inject into the muscle. 03/23/22   Carlyle Basques, MD  omega-3 acid ethyl esters (LOVAZA) 1 g capsule Take by mouth 2 (two) times daily.    [provider]  Respiratory Therapy Supplies (NEBULIZER/TUBING/MOUTHPIECE) KIT Use as directed. 04/24/21   Billie Ruddy, MD  Vitamin D-Vitamin K (VITAMIN K2-VITAMIN D3 PO) Take 2 drops by mouth daily. Omega 3 2 Drops =5000 Units    [provider]  levocetirizine (XYZAL) 5 MG tablet TAKE 1 TABLET BY MOUTH EVERY DAY IN THE EVENING 04/15/19 10/23/19  Billie Ruddy, MD    Family History Family History  Problem Relation Age of Onset   Migraines Paternal 45    Migraines Paternal Grandfather    HIV Father    Diabetes Mother    Hypertension Mother    Anemia Mother    COPD Mother    CVA Mother    Congestive Heart Failure Mother    Cirrhosis Mother    Obesity Mother    Irritable bowel syndrome Mother    Rheum arthritis Mother    Ulcers Brother    Anemia Sister    Cholecystitis Brother    Migraines Paternal Aunt    Migraines Maternal Uncle        Maternal Great Uncle   Diabetes Other        Family History   Hypertension Other        Family History   Depression Other        Family History   Asthma Other        Family History   Allergic rhinitis Other        Family History   Asthma  Brother    Colon cancer Neg Hx    Colon polyps Neg Hx    Esophageal cancer Neg Hx    Rectal cancer Neg Hx    Stomach cancer Neg Hx     Social History Social History   Tobacco Use   Smoking status:  Never   Smokeless tobacco: Never  Vaping Use   Vaping Use: Never used  Substance Use Topics   Alcohol use: Not Currently   Drug use: No     Allergies   Patient has no known allergies.   Review of Systems Review of Systems  Respiratory:  Positive for cough.   Per HPI   Physical Exam Triage Vital Signs ED Triage Vitals  Enc Vitals Group     BP 08/18/22 1018 101/68     Pulse Rate 08/18/22 1018 68     Resp 08/18/22 1018 15     Temp 08/18/22 1018 98.5 F (36.9 C)     Temp Source 08/18/22 1018 Oral     SpO2 08/18/22 1018 99 %     Weight --      Height --      Head Circumference --      Peak Flow --      Pain Score 08/18/22 1016 0     Pain Loc --      Pain Edu? --      Excl. in [place]? --    No data found.  Updated Vital Signs BP 101/68 (BP Location: Right Arm)   Pulse 68   Temp 98.5 F (36.9 C) (Oral)   Resp 15   LMP 08/09/2022   SpO2 99%   Visual Acuity Right Eye Distance:   Left Eye Distance:   Bilateral Distance:    Right Eye Near:   Left Eye Near:    Bilateral Near:     Physical Exam Vitals and nursing note reviewed.  Constitutional:      Appearance: She is not ill-appearing or toxic-appearing.  HENT:     Head: Normocephalic and atraumatic.     Right Ear: Hearing, tympanic membrane, ear canal and external ear normal.     Left Ear: Hearing, tympanic membrane, ear canal and external ear normal.     Nose: Congestion present.     Mouth/Throat:     Lips: Pink.     Mouth: Mucous membranes are moist. No injury.     Tongue: No lesions. Tongue does not deviate from midline.     Palate: No mass and lesions.     Pharynx: Oropharynx is clear. Uvula midline. No pharyngeal swelling, oropharyngeal exudate, posterior oropharyngeal erythema or uvula swelling.      Tonsils: No tonsillar exudate or tonsillar abscesses.  Eyes:     General: Lids are normal. Vision grossly intact. Gaze aligned appropriately.        Right eye: No discharge.        Left eye: No discharge.     Extraocular Movements: Extraocular movements intact.     Conjunctiva/sclera: Conjunctivae normal.  Cardiovascular:     Rate and Rhythm: Normal rate and regular rhythm.     Heart sounds: Normal heart sounds, S1 normal and S2 normal.  Pulmonary:     Effort: Pulmonary effort is normal. No respiratory distress.     Breath sounds: Normal breath sounds and air entry.  Musculoskeletal:     Cervical back: Neck supple.  Lymphadenopathy:     Cervical: No cervical adenopathy.  Skin:    General: Skin is warm and dry.     Capillary Refill: Capillary refill takes less than 2 seconds.     Findings: No rash.  Neurological:     General: No focal deficit present.     Mental Status: She is alert and oriented to person, place, and time. Mental status  is at baseline.     Cranial Nerves: No dysarthria or facial asymmetry.  Psychiatric:        Mood and Affect: Mood normal.        Speech: Speech normal.        Behavior: Behavior normal.        Thought Content: Thought content normal.        Judgment: Judgment normal.      UC Treatments / Results  Labs (all labs ordered are listed, but only abnormal results are displayed) Labs Reviewed - No data to display  EKG   Radiology No results found.  Procedures Procedures (including critical care time)  Medications Ordered in UC Medications - No data to display  Initial Impression / Assessment and Plan / UC Course  I have reviewed the triage vital signs and the nursing notes.  Pertinent labs & imaging results that were available during my care of the patient were reviewed by me and considered in my medical decision making (see chart for details).   1. Viral URI with cough .Symptoms and physical exam consistent with a viral upper  respiratory tract infection that will likely resolve with rest, fluids, and prescriptions for symptomatic relief. Deferred imaging based on stable cardiopulmonary exam and hemodynamically stable vital signs.  Deferred viral testing as patient has had COVID-19 infection in the last 90 days and this is contraindicated.  Low suspicion for influenza etiology.  She does not meet Centor criteria and HEENT exam shows stable findings.  Promethazine DM and tessalon perles sent to pharmacy for symptomatic relief to be taken as prescribed.  Albuterol inhaler refill sent to pharmacy to be used as needed. No indication for steroid therapy today as lungs are clear and patient is nontoxic in appearance. May continue taking over the counter medications as directed for further symptomatic relief.  Drowsiness precautions discussed regarding promethazine DM prescription.  Nonpharmacologic interventions for symptom relief provided and after visit summary below. Advised to push fluids to stay well hydrated while recovering from viral illness.   Discussed physical exam and available lab work findings in clinic with patient.  Counseled patient regarding appropriate use of medications and potential side effects for all medications recommended or prescribed today. Discussed red flag signs and symptoms of worsening condition,when to call the PCP office, return to urgent care, and when to seek higher level of care in the emergency department. Patient verbalizes understanding and agreement with plan. All questions answered. Patient discharged in stable condition.    Final Clinical Impressions(s) / UC Diagnoses   Final diagnoses:  Viral URI with cough  Mild intermittent asthma without complication     Discharge Instructions      You have a viral upper respiratory infection.   Use the following medicines to help with symptoms: - Plain Mucinex (guaifenesin) over the counter as directed every 12 hours to thin mucous so that  you are able to get it out of your body easier. Drink plenty of water while taking this medication so that it works well in your body (at least 8 cups a day).  - Tylenol 1,'000mg'$  and/or ibuprofen '600mg'$  every 6 hours with food as needed for aches/pains or fever/chills.  - Tessalon perles every 8 hours as needed for cough. - Take Promethazine DM cough medication to help with your cough at nighttime so that you are able to sleep. Do not drive, drink alcohol, or go to work while taking this medication since it can make you sleepy. Only  take this at nighttime.   1 tablespoon of honey in warm water and/or salt water gargles may also help with symptoms. Humidifier to your room will help add water to the air and reduce coughing.  If you develop any new or worsening symptoms, please return.  If your symptoms are severe, please go to the emergency room.  Follow-up with your primary care provider for further evaluation and management of your symptoms as well as ongoing wellness visits.  I hope you feel better!    ED Prescriptions     Medication Sig Dispense Auth. Provider   albuterol (VENTOLIN HFA) 108 (90 Base) MCG/ACT inhaler Inhale 2 puffs into the lungs every 6 (six) hours as needed for wheezing or shortness of breath. 8 g Joella Prince M, FNP   promethazine-dextromethorphan (PROMETHAZINE-DM) 6.25-15 MG/5ML syrup Take 5 mLs by mouth at bedtime as needed for cough. 118 mL Joella Prince M, FNP   benzonatate (TESSALON) 100 MG capsule Take 1 capsule (100 mg total) by mouth every 8 (eight) hours. 21 capsule Talbot Grumbling, FNP      PDMP not reviewed this encounter.   Talbot Grumbling, [place] 08/18/22 1103

## 2022-08-18 NOTE — Discharge Instructions (Addendum)
You have a viral upper respiratory infection.   Use the following medicines to help with symptoms: - Plain Mucinex (guaifenesin) over the counter as directed every 12 hours to thin mucous so that you are able to get it out of your body easier. Drink plenty of water while taking this medication so that it works well in your body (at least 8 cups a day).  - Tylenol 1,'000mg'$  and/or ibuprofen '600mg'$  every 6 hours with food as needed for aches/pains or fever/chills.  - Tessalon perles every 8 hours as needed for cough. - Take Promethazine DM cough medication to help with your cough at nighttime so that you are able to sleep. Do not drive, drink alcohol, or go to work while taking this medication since it can make you sleepy. Only take this at nighttime.   1 tablespoon of honey in warm water and/or salt water gargles may also help with symptoms. Humidifier to your room will help add water to the air and reduce coughing.  If you develop any new or worsening symptoms, please return.  If your symptoms are severe, please go to the emergency room.  Follow-up with your primary care provider for further evaluation and management of your symptoms as well as ongoing wellness visits.  I hope you feel better!

## 2022-08-18 NOTE — ED Triage Notes (Signed)
Pt reports for couple days coughing up green mucous, congestion, tightness in chest from asthma and running out of her inhaler.

## 2022-08-21 ENCOUNTER — Encounter: Payer: Self-pay | Admitting: Family Medicine

## 2022-08-23 ENCOUNTER — Institutional Professional Consult (permissible substitution): Payer: Commercial Managed Care - HMO | Admitting: Cardiovascular Disease

## 2022-09-03 ENCOUNTER — Ambulatory Visit: Payer: Commercial Managed Care - HMO | Admitting: Obstetrics and Gynecology

## 2022-09-13 ENCOUNTER — Ambulatory Visit (HOSPITAL_BASED_OUTPATIENT_CLINIC_OR_DEPARTMENT_OTHER): Payer: Commercial Managed Care - HMO | Admitting: Orthopaedic Surgery

## 2022-09-14 ENCOUNTER — Ambulatory Visit (HOSPITAL_BASED_OUTPATIENT_CLINIC_OR_DEPARTMENT_OTHER): Payer: Commercial Managed Care - HMO | Admitting: Orthopaedic Surgery

## 2022-10-17 ENCOUNTER — Encounter: Payer: Self-pay | Admitting: Cardiovascular Disease

## 2022-10-17 ENCOUNTER — Ambulatory Visit: Payer: Commercial Managed Care - HMO | Attending: Cardiovascular Disease | Admitting: Cardiovascular Disease

## 2022-10-17 VITALS — BP 114/68 | HR 62 | Ht <= 58 in | Wt 118.6 lb

## 2022-10-17 DIAGNOSIS — R002 Palpitations: Secondary | ICD-10-CM

## 2022-10-17 NOTE — Progress Notes (Signed)
Electrophysiology Office Note:    Date:  10/17/2022   ID:  Maria Smith, DOB July 12, 1995, MRN [id number]  PCP:  [place], Betty G, MD   [facility] Providers Cardiologist:  Armanda Magic, MD     Referring MD: Sharlene Dory, PA-C   History of Present Illness:    Maria Smith is a 27 y.o. female with a hx listed below, significant for seasonal allergies, thyromegaly and latent TB, referred for evaluation of palpitations.  She was seen in 2020 for palpitations.  Heart monitor did not show any arrhythmia, and an echo showed normal cardiac structure and function.  She returned in December 2023 with similar complaints. Again, TTE and heart monitor were unremarkable.  She did have the palpitations while wearing these monitors. She describes them as a fluttering, sometimes racing, sometimes a dropping sensation -- like taking the plunge on a roller coaster. These events often happen when she is upset or has a lot of caffeine.  Past Medical History:  Diagnosis Date   Allergic rhinitis    Allergy    SEASONAL   Asthma    Complication of anesthesia    Low BP   Eczema    Environmental allergies    Age 47 years   Family history of adverse reaction to anesthesia    Mother went into coma after anesthesia. 4 days to wake up   Fatty liver 12/11/2018   Noted on CT 6/20   Generalized headaches    Migraines. 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 Rifampin since 08/2015   Streptococcal infection(041.00)    Age 75 and 27 years old   TB lung, latent    2017   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/2018   Diffuse enlargement on [place] 6/20    Past Surgical History:  Procedure Laterality Date   ESOPHAGOGASTRODUODENOSCOPY ENDOSCOPY  2021   HIP ARTHROSCOPY Left 03/20/2022   Procedure: LEFT HIP ARTHROSCOPY WITH LABRAL REPAIR;  Surgeon: Huel Cote, MD;  Location: [facility];  Service:  Orthopedics;  Laterality: Left;   TONSILLECTOMY  06/18/2010   WISDOM TOOTH EXTRACTION      Current Medications: Current Meds  Medication Sig   acetaminophen (TYLENOL CHILDRENS) 160 MG/5ML suspension Take 31.3 mLs (1,000 mg total) by mouth every 8 (eight) hours as needed.   albuterol (VENTOLIN HFA) 108 (90 Base) MCG/ACT inhaler Inhale 2 puffs into the lungs every 6 (six) hours as needed for wheezing or shortness of breath.   ferrous sulfate 325 (65 FE) MG tablet Take 325 mg by mouth every other day.   fluticasone (FLONASE) 50 MCG/ACT nasal spray Place 2 sprays into both nostrils daily.   ibuprofen (ADVIL) 100 MG/5ML suspension Take 30 mLs (600 mg total) by mouth every 8 (eight) hours as needed.   influenza vac split quadrivalent PF (FLUARIX) 0.5 ML injection Inject into the muscle.   omega-3 acid ethyl esters (LOVAZA) 1 g capsule Take by mouth 2 (two) times daily.   promethazine-dextromethorphan (PROMETHAZINE-DM) 6.25-15 MG/5ML syrup Take 5 mLs by mouth at bedtime as needed for cough.   Respiratory Therapy Supplies (NEBULIZER/TUBING/MOUTHPIECE) KIT Use as directed.   Vitamin D-Vitamin K (VITAMIN K2-VITAMIN D3 PO) Take 2 drops by mouth daily. Omega 3 2 Drops =5000 Units   [DISCONTINUED] benzonatate (TESSALON) 100 MG capsule Take 1 capsule (100 mg total) by mouth every 8 (  eight) hours.   [DISCONTINUED] dextromethorphan-guaiFENesin (MUCINEX DM) 30-600 MG 12hr tablet Take 1 tablet by mouth 2 (two) times daily.     Allergies:   Patient has no known allergies.   Social and Family History: Reviewed in Epic  ROS:   Please see the history of present illness.    All other systems reviewed and are negative.  EKGs/Labs/Other Studies Reviewed Today:    Echocardiogram:  06/12/22  1. Left ventricular ejection fraction, by estimation, is 55 to 60%. The  left ventricle has normal function. The left ventricle has no regional  wall motion abnormalities. Left ventricular diastolic parameters were   normal.   2. Right ventricular systolic function is normal. The right ventricular  size is normal. There is normal pulmonary artery systolic pressure. The  estimated right ventricular systolic pressure is 21.1 mmHg.   3. The mitral valve is normal in structure. Trivial mitral valve  regurgitation. No evidence of mitral stenosis.   4. The aortic valve is tricuspid. Aortic valve regurgitation is not  visualized. No aortic stenosis is present.   5. The inferior vena cava is normal in size with greater than 50%  respiratory variability, suggesting right atrial pressure of 3 mmHg.    Monitors:  Zio 06/12/22 - my interpretation 3s 14 hours wear time Sinus rhythm HR 49-136, avg 74 One patient-triggered event correlated with sinus rhythm  Zio XT 11/22/202 2d 11 hour wear time Sinus rhythm HR 45-123, avg 74 No ectopy detected Multiple patient triggered events correlated with sinus rhythm  Stress testing:   Advanced imaging:   EKG:  Last EKG results: today -- my interpretation: sinus rhythm   Recent Labs: 03/05/2022: ALT 13; Hemoglobin 12.9; Platelets 361 06/21/2022: BUN 7; Creatinine, Ser 0.79; Magnesium 2.3; Potassium 4.0; Sodium 141; TSH 1.650     Physical Exam:    VS:  BP 114/68   Pulse 62   Ht 4\' 10"  (1.473 m)   Wt 118 lb 9.6 oz (53.8 kg)   SpO2 99%   BMI 24.79 kg/m     Wt Readings from Last 3 Encounters:  10/17/22 118 lb 9.6 oz (53.8 kg)  07/06/22 113 lb (51.3 kg)  06/28/22 113 lb (51.3 kg)     GEN: Well nourished, well developed in no acute distress CARDIAC: RRR, no murmurs, rubs, gallops RESPIRATORY:  Normal work of breathing MUSCULOSKELETAL: no edema    ASSESSMENT & PLAN:    Palpitations Multiple monitors have not shown any arrhythmia associated with patient-triggered events. At this point, we can exclude arrhythmia as a cause for her symptoms I will be happy to see her again in EP clinic if she develops any arrhythmia issues      I spent 47  minutes on this visit. I counseled the patient and reassured her that there do not appear to be any heart rhythm issues associated with her symptoms, and the therapies I have to offer -- ablation and antiarrhythmic medications will not be of any benefit to her.  Medication Adjustments/Labs and Tests Ordered: Current medicines are reviewed at length with the patient today.  Concerns regarding medicines are outlined above.  Orders Placed This Encounter  Procedures   EKG 12-Lead   No orders of the defined types were placed in this encounter.    Signed, Maurice Small, MD  10/17/2022 8:28 AM    [facility]

## 2022-10-17 NOTE — Patient Instructions (Signed)
Medication Instructions:  Your physician recommends that you continue on your current medications as directed. Please refer to the Current Medication list given to you today. *If you need a refill on your cardiac medications before your next appointment, please call your pharmacy*   Follow-Up: At [facility], you and your health needs are our priority.  As part of our continuing mission to provide you with exceptional heart care, we have created designated Provider Care Teams.  These Care Teams include your primary Cardiologist (physician) and Advanced Practice Providers (APPs -  Physician Assistants and Nurse Practitioners) who all work together to provide you with the care you need, when you need it.  We recommend signing up for the patient portal called "MyChart".  Sign up information is provided on this After Visit Summary.  MyChart is used to connect with patients for Virtual Visits (Telemedicine).  Patients are able to view lab/test results, encounter notes, upcoming appointments, etc.  Non-urgent messages can be sent to your provider as well.   To learn more about what you can do with MyChart, go to [email or web address].    Your next appointment:   As needed  Provider:   Augustus Mealor, MD  

## 2022-10-30 ENCOUNTER — Ambulatory Visit: Payer: Commercial Managed Care - HMO | Admitting: Cardiology

## 2022-11-09 ENCOUNTER — Encounter (HOSPITAL_BASED_OUTPATIENT_CLINIC_OR_DEPARTMENT_OTHER): Payer: Self-pay | Admitting: Cardiovascular Disease

## 2022-11-09 ENCOUNTER — Ambulatory Visit (HOSPITAL_BASED_OUTPATIENT_CLINIC_OR_DEPARTMENT_OTHER): Payer: Commercial Managed Care - HMO | Admitting: Cardiovascular Disease

## 2022-11-09 VITALS — BP 108/72 | HR 75 | Ht <= 58 in | Wt 117.6 lb

## 2022-11-09 DIAGNOSIS — I491 Atrial premature depolarization: Secondary | ICD-10-CM | POA: Insufficient documentation

## 2022-11-09 DIAGNOSIS — I493 Ventricular premature depolarization: Secondary | ICD-10-CM | POA: Diagnosis not present

## 2022-11-09 HISTORY — DX: Atrial premature depolarization: I49.1

## 2022-11-09 HISTORY — DX: Ventricular premature depolarization: I49.3

## 2022-11-09 NOTE — Progress Notes (Signed)
Cardiology Office Note   Date:  11/09/2022   ID:  Maria, Smith 1995/08/06, MRN [id number]  PCP:  [place], Betty G, MD  Cardiologist:   Chilton Si, MD   No chief complaint on file.    History of Present Illness: Maria Smith is a 27 y.o. female with no significant cardiac history here for follow up on palpitations. Maria Smith has a history of palpitations.  She first saw Dr. Antoine Poche in 2019.  She had previously worn a monitor that showed PACs and PVCs but no significant arrhythmias.  She was seen by Dr. Gaynelle Arabian in 2020.  Monitor at that time revealed sinus rhythm with sinus bradycardia and sinus tachycardia.  There were rare PVCs and PACs but no arrhythmias.  Echo at that time revealed LVEF 60 to 65% and was otherwise unremarkable.  She had recurrent palpitations 06/06/2022 and saw Dr. Mayford Knife.  She also reported exertional dyspnea at that time.  Monitor was unchanged from prior and echo was unremarkable.  Given that her mother had a stroke at a young age she was referred for an NMR lipid profile and an LP(a), both of which were low risk.  She saw EP 10/2022 who noted that all of her triggered events on monitoring showed no underlying arrhythmias and recommended no further cardiac evaluation.  [name] reports that she is generally feeling well but experiences a quick increase in heart rate and slight chest tightness during exercise, which she attributes to her asthma. She mentions that her heart function, including the pumping and diastolic functions, are normal based on recent echocardiogram results. She also notes trivial mitral valve regurgitation, which has been identified as normal and asymptomatic.  She has a family history of heart disease and strokes, which concerns her, especially as she plans for pregnancy in the future. Despite this, her cholesterol levels and other related tests have returned favorable results. [name] does not smoke and consumes wine occasionally,  adhering to the recommended limits for women.  [name] also experiences palpitations when feeling anxious or stressed, which she believes are related to her emotional state rather than any underlying cardiac issue. She confirms that she does not experience any swelling in her legs or feet, which she takes as a positive indicator of her cardiac health.  Additionally, [name] has been diagnosed with Polycystic Ovary Syndrome (PCOS), though she describes her case as atypical. She is keen to ensure that all aspects of her health are optimal before attempting to conceive. Her blood pressure is notably low, which she understands is beneficial as long as it does not cause symptoms like lightheadedness or dizziness, which she occasionally experiences when standing up quickly.  Overall, [name] is proactive about her health, particularly in the context of her family history and future pregnancy plans. She is under regular observation by her obstetrician and maintains a routine of regular exercise, which she feels comfortable with.  Past Medical History:  Diagnosis Date   Allergic rhinitis    Allergy    SEASONAL   Asthma    Complication of anesthesia    Low BP   Eczema    Environmental allergies    Age 73 years   Family history of adverse reaction to anesthesia    Mother went into coma after anesthesia. 4 days to wake up   Fatty liver 12/11/2018   Noted on CT 6/20   Generalized headaches    Migraines. 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   PVC (premature ventricular contraction) 11/09/2022   Streptococcal infection(041.00)    Age 20 and 27 years old   TB lung, latent    2017   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/2018   Diffuse enlargement on [place] 6/20    Past Surgical History:  Procedure Laterality Date    ESOPHAGOGASTRODUODENOSCOPY ENDOSCOPY  2021   HIP ARTHROSCOPY Left 03/20/2022   Procedure: LEFT HIP ARTHROSCOPY WITH LABRAL REPAIR;  Surgeon: Huel Cote, MD;  Location: [facility] OR;  Service: Orthopedics;  Laterality: Left;   TONSILLECTOMY  06/18/2010   WISDOM TOOTH EXTRACTION       Current Outpatient Medications  Medication Sig Dispense Refill   albuterol (VENTOLIN HFA) 108 (90 Base) MCG/ACT inhaler Inhale 2 puffs into the lungs every 6 (six) hours as needed for wheezing or shortness of breath. 6.7 g 2   ferrous sulfate 325 (65 FE) MG tablet Take 325 mg by mouth every other day.     fluticasone (FLONASE) 50 MCG/ACT nasal spray Place 2 sprays into both nostrils daily. 16 g 1   ibuprofen (ADVIL) 100 MG/5ML suspension Take 30 mLs (600 mg total) by mouth every 8 (eight) hours as needed. 450 mL 0   influenza vac split quadrivalent PF (FLUARIX) 0.5 ML injection Inject into the muscle. 0.5 mL 0   omega-3 acid ethyl esters (LOVAZA) 1 g capsule Take by mouth 2 (two) times daily.     Respiratory Therapy Supplies (NEBULIZER/TUBING/MOUTHPIECE) KIT Use as directed. 1 kit 3   Vitamin D-Vitamin K (VITAMIN K2-VITAMIN D3 PO) Take 2 drops by mouth daily. Omega 3 2 Drops =5000 Units     No current facility-administered medications for this visit.    Allergies:   Patient has no known allergies.    Social History:  The patient  reports that she has never smoked. She has never used smokeless tobacco. She reports that she does not currently use alcohol. She reports that she does not use drugs.   Family History:  The patient's family history includes Allergic rhinitis in an other family member; Anemia in her mother and sister; Asthma in her brother and another family member; COPD in her mother; CVA in her mother; Cholecystitis in her brother; Cirrhosis in her mother; Congestive Heart Failure in her mother; Depression in an other family member; Diabetes in her mother and another family member; HIV in her father;  Hypertension in her mother and another family member; Irritable bowel syndrome in her mother; Migraines in her maternal uncle, paternal aunt, paternal grandfather, and paternal grandmother; Obesity in her mother; Rheum arthritis in her mother; Ulcers in her brother.    ROS:  Please see the history of present illness.   Otherwise, review of systems are positive for none.   All other systems are reviewed and negative.    PHYSICAL EXAM: VS:  BP 108/72 (BP Location: Right Arm, Patient Position: Sitting, Cuff Size: Normal)   Pulse 75   Ht 4\' 10"  (1.473 m)   Wt 117 lb 9.6 oz (53.3 kg)   SpO2 98%   BMI 24.58 kg/m  , BMI Body mass index is 24.58 kg/m. GENERAL:  Well appearing HEENT:  Pupils equal round and reactive, fundi not visualized, oral mucosa unremarkable NECK:  No jugular venous distention, waveform within normal limits, carotid upstroke  brisk and symmetric, no bruits, no thyromegaly LUNGS:  Clear to auscultation bilaterally HEART:  RRR.  PMI not displaced or sustained,S1 and S2 within normal limits, no S3, no S4, no clicks, no rubs, no murmurs ABD:  Flat, positive bowel sounds normal in frequency in pitch, no bruits, no rebound, no guarding, no midline pulsatile mass, no hepatomegaly, no splenomegaly EXT:  2 plus pulses throughout, no edema, no cyanosis no clubbing SKIN:  No rashes no nodules NEURO:  Cranial nerves II through XII grossly intact, motor grossly intact throughout PSYCH:  Cognitively intact, oriented to person place and time    EKG:  EKG is not ordered today.   Recent Labs: 03/05/2022: ALT 13; Hemoglobin 12.9; Platelets 361 06/21/2022: BUN 7; Creatinine, Ser 0.79; Magnesium 2.3; Potassium 4.0; Sodium 141; TSH 1.650    Lipid Panel    Component Value Date/Time   CHOL 167 06/26/2017 1155   TRIG 32 06/26/2017 1155   HDL 94 06/26/2017 1155   CHOLHDL 1.8 06/26/2017 1155   LDLCALC 67 06/26/2017 1155      Wt Readings from Last 3 Encounters:  11/09/22 117 lb 9.6 oz  (53.3 kg)  10/17/22 118 lb 9.6 oz (53.8 kg)  07/06/22 113 lb (51.3 kg)      ASSESSMENT AND PLAN:  # Family history of heart disease and strokes: - Patient has a positive family history of heart disease and strokes but has normal cholesterol levels and LP little A. - Encourage a healthy lifestyle, including regular exercise and a heart-healthy diet. Limit alcohol intake. Continue monitoring cholesterol levels and blood pressure.  # PACs/PVCs:  Reviewed echo and monitor results.  No concerning findings.  We discussed that her evaluation has been reassuring.  She is not at increased risk for pregnancy.   - Patient experiences occasional palpitations, likely related to stress and anxiety. - Encourage stress management techniques and regular exercise. Monitor symptoms and consider referral if anxiety worsens.  Pregnancy planning: - Patient is planning for pregnancy and wants to ensure her heart is in good condition. Echocardiogram findings are normal, and blood pressure is well-controlled. - Reassure the patient that her heart is in good condition. Encourage regular prenatal care with her OB. Monitor blood pressure during pregnancy.   Current medicines are reviewed at length with the patient today.  The patient does not have concerns regarding medicines.  The following changes have been made:  no change  Labs/ tests ordered today include:  No orders of the defined types were placed in this encounter.    Disposition:   FU with Luticia Tadros C. Duke Salvia, MD, [facility] as needed     Signed, Ian Castagna C. Duke Salvia, MD, [facility]  11/09/2022 10:25 AM    [facility]

## 2022-11-09 NOTE — Patient Instructions (Signed)
Medication Instructions:  Your physician recommends that you continue on your current medications as directed. Please refer to the Current Medication list given to you today.  *If you need a refill on your cardiac medications before your next appointment, please call your pharmacy*  Follow-Up: At [facility], you and your health needs are our priority.  As part of our continuing mission to provide you with exceptional heart care, we have created designated Provider Care Teams.  These Care Teams include your primary Cardiologist (physician) and Advanced Practice Providers (APPs -  Physician Assistants and Nurse Practitioners) who all work together to provide you with the care you need, when you need it.  We recommend signing up for the patient portal called "MyChart".  Sign up information is provided on this After Visit Summary.  MyChart is used to connect with patients for Virtual Visits (Telemedicine).  Patients are able to view lab/test results, encounter notes, upcoming appointments, etc.  Non-urgent messages can be sent to your provider as well.   To learn more about what you can do with MyChart, go to [email or web address].    Your next appointment:   As needed with Dr. Duke Salvia

## 2023-01-02 ENCOUNTER — Encounter: Payer: Self-pay | Admitting: Family Medicine

## 2023-01-02 ENCOUNTER — Ambulatory Visit (INDEPENDENT_AMBULATORY_CARE_PROVIDER_SITE_OTHER): Payer: Commercial Managed Care - HMO | Admitting: Family Medicine

## 2023-01-02 VITALS — BP 110/72 | HR 63 | Temp 98.5°F | Wt 119.6 lb

## 2023-01-02 DIAGNOSIS — L309 Dermatitis, unspecified: Secondary | ICD-10-CM | POA: Diagnosis not present

## 2023-01-02 MED ORDER — TRIAMCINOLONE ACETONIDE 0.1 % EX CREA: 1.0000 | TOPICAL_CREAM | Freq: Two times a day (BID) | CUTANEOUS | 0 refills | Status: DC

## 2023-01-02 NOTE — Progress Notes (Signed)
   Subjective:    Patient ID: Maria Smith, female    DOB: Jul 25, 1995, 27 y.o.   MRN: [id number]  HPI Here for eczema on her neck and upper chest. This itches and burns. She has been applying Eucerin lotion with mixed results.    Review of Systems  Constitutional: Negative.   Respiratory: Negative.    Cardiovascular: Negative.   Skin:  Positive for rash.       Objective:   Physical Exam Constitutional:      Appearance: Normal appearance.  Cardiovascular:     Rate and Rhythm: Normal rate and regular rhythm.     Pulses: Normal pulses.     Heart sounds: Normal heart sounds.  Pulmonary:     Effort: Pulmonary effort is normal.     Breath sounds: Normal breath sounds.  Skin:    Comments: The neck and upper chest has areas of slight erythema and scaling   Neurological:     Mental Status: She is alert.           Assessment & Plan:  Eczema, treat with Triamcinolone cream as needed.  Gershon Crane, MD

## 2023-01-10 ENCOUNTER — Encounter (HOSPITAL_COMMUNITY): Payer: Self-pay

## 2023-01-10 ENCOUNTER — Other Ambulatory Visit: Payer: Self-pay

## 2023-01-10 ENCOUNTER — Ambulatory Visit (HOSPITAL_COMMUNITY)
Admission: RE | Admit: 2023-01-10 | Discharge: 2023-01-10 | Disposition: A | Discharge status: Home / Self Care | Payer: Commercial Managed Care - HMO | Source: Ambulatory Visit | Attending: Family Medicine | Admitting: Family Medicine

## 2023-01-10 VITALS — BP 113/80 | HR 74 | Temp 98.6°F | Resp 18

## 2023-01-10 DIAGNOSIS — N760 Acute vaginitis: Secondary | ICD-10-CM | POA: Insufficient documentation

## 2023-01-10 MED ORDER — ACETAMINOPHEN 325 MG PO TABS: 650.0000 mg | ORAL_TABLET | Freq: Once | ORAL | Status: DC

## 2023-01-10 MED ORDER — FLUCONAZOLE 150 MG PO TABS: 150.0000 mg | ORAL_TABLET | ORAL | 0 refills | Status: AC

## 2023-01-10 NOTE — Discharge Instructions (Signed)
We will notify you of any positive test on the swab  Take fluconazole 150 mg--1 tablet every 3 days for 2 doses

## 2023-01-10 NOTE — ED Triage Notes (Signed)
Symptoms started 2 days ago.  Symptom worsened last night. Patient used a boric suppository last night and feels more irritation than before.  Reports vaginal discharge, white.  Denies back pain, denies abdominal apin and denies urinary symptoms.

## 2023-01-10 NOTE — ED Provider Notes (Signed)
[facility]    CSN: [id number] Arrival date & time: 01/10/23  0807      History   Chief Complaint Chief Complaint  Patient presents with   Vaginal Discharge    Vaginal irritation - Entered by patient    HPI Maria Smith is a 27 y.o. female.    Vaginal Discharge Or irritation and discharge.  Symptoms began 2 days ago, and then she used a boric acid suppository.  She feels she was not able to completely insert it and that it is irritated her external tissues.  No dysuria and no fever or abdominal pain or vomiting.  Last menstrual cycle July 3.    Past Medical History:  Diagnosis Date   Allergic rhinitis    Allergy    SEASONAL   Asthma    Complication of anesthesia    Low BP   Eczema    Environmental allergies    Age 68 years   Family history of adverse reaction to anesthesia    Mother went into coma after anesthesia. 4 days to wake up   Fatty liver 12/11/2018   Noted on CT 6/20   Generalized headaches    Migraines. 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 Rifampin since 08/2015   PVC (premature ventricular contraction) 11/09/2022   Streptococcal infection(041.00)    Age 44 and 27 years old   TB lung, latent    2017   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/2018   Diffuse enlargement on [place] 6/20    Patient Active Problem List   Diagnosis Date Noted   Eczema 01/02/2023   PAC (premature atrial contraction) 11/09/2022   PVC (premature ventricular contraction) 11/09/2022   Tear of left acetabular labrum    Pain in left hip 01/27/2021   Bruising 01/27/2021   Left-sided chest wall pain 03/23/2020   Positive ANA (antinuclear antibody) 03/23/2020   Asthma 02/10/2020   Allergic rhinitis 02/10/2020   GERD (gastroesophageal reflux disease) 02/10/2020   History of TB (tuberculosis) 08/21/2019   Throat pain  01/28/2019   Odynophagia 01/28/2019   Neck pain 01/28/2019   Thyroiditis 01/12/2019   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/2018   Fatty liver 12/11/2018   PCOS (polycystic ovarian syndrome) 11/11/2018   Seasonal allergies 09/22/2018   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   Migraine with aura and without status migrainosus, not intractable 09/11/2012   Variants of migraine, not elsewhere classified, without mention of intractable migraine without mention of status migrainosus 09/11/2012   Unspecified constipation 09/11/2012   Syncope 08/31/2010   Neck Pain 08/31/2010   Insomnia 08/31/2010    Past Surgical History:  Procedure Laterality Date   ESOPHAGOGASTRODUODENOSCOPY ENDOSCOPY  2021   HIP ARTHROSCOPY Left 03/20/2022   Procedure: LEFT HIP ARTHROSCOPY WITH LABRAL REPAIR;  Surgeon: Huel Cote, MD;  Location: [facility] OR;  Service: Orthopedics;  Laterality: Left;   TONSILLECTOMY  06/18/2010   WISDOM TOOTH EXTRACTION      OB History     Gravida  1   Para  0   Term  0   Preterm  0   AB  0   Living  0      SAB  0   IAB  0   Ectopic  0   Multiple  0   Live Births  0  Home Medications    Prior to Admission medications   Medication Sig Start Date End Date Taking? Authorizing Provider  fluconazole (DIFLUCAN) 150 MG tablet Take 1 tablet (150 mg total) by mouth every 3 (three) days for 2 doses. 01/10/23 01/14/23 Yes Porche Steinberger, Janace Aris, MD  albuterol (VENTOLIN HFA) 108 (90 Base) MCG/ACT inhaler Inhale 2 puffs into the lungs every 6 (six) hours as needed for wheezing or shortness of breath. 08/18/22   Carlisle Beers, FNP  ferrous sulfate 325 (65 FE) MG tablet Take 325 mg by mouth every other day.    [provider]  fluticasone (FLONASE) 50 MCG/ACT nasal spray Place 2 sprays into both nostrils daily. 03/27/22   [place], Betty G, MD  influenza vac split quadrivalent PF (FLUARIX) 0.5 ML injection Inject into the muscle. 03/23/22   Judyann Munson, MD  omega-3 acid ethyl esters  (LOVAZA) 1 g capsule Take by mouth 2 (two) times daily.    [provider]  Respiratory Therapy Supplies (NEBULIZER/TUBING/MOUTHPIECE) KIT Use as directed. 04/24/21   Deeann Saint, MD  triamcinolone cream (KENALOG) 0.1 % Apply 1 Application topically 2 (two) times daily. 01/02/23   Nelwyn Salisbury, MD  Vitamin D-Vitamin K (VITAMIN K2-VITAMIN D3 PO) Take 2 drops by mouth daily. Omega 3 2 Drops =5000 Units    [provider]    Family History Family History  Problem Relation Age of Onset   Migraines Paternal Grandmother    Migraines Paternal Grandfather    HIV Father    Diabetes Mother    Hypertension Mother    Anemia Mother    COPD Mother    CVA Mother    Congestive Heart Failure Mother    Cirrhosis Mother    Obesity Mother    Irritable bowel syndrome Mother    Rheum arthritis Mother    Ulcers Brother    Anemia Sister    Cholecystitis Brother    Migraines Paternal Aunt    Migraines Maternal Uncle        Maternal Great Uncle   Diabetes Other        Family History   Hypertension Other        Family History   Depression Other        Family History   Asthma Other        Family History   Allergic rhinitis Other        Family History   Asthma Brother    Colon cancer Neg Hx    Colon polyps Neg Hx    Esophageal cancer Neg Hx    Rectal cancer Neg Hx    Stomach cancer Neg Hx     Social History Social History   Tobacco Use   Smoking status: Never   Smokeless tobacco: Never  Vaping Use   Vaping status: Never Used  Substance Use Topics   Alcohol use: Yes   Drug use: No     Allergies   Patient has no known allergies.   Review of Systems Review of Systems  Genitourinary:  Positive for vaginal discharge.     Physical Exam Triage Vital Signs ED Triage Vitals  Encounter Vitals Group     BP 01/10/23 0841 113/80     Systolic BP Percentile --      Diastolic BP Percentile --      Pulse Rate 01/10/23 0841 74     Resp 01/10/23 0841 18     Temp  01/10/23 0841 98.6 F (37 C)  Temp Source 01/10/23 0841 Oral     SpO2 01/10/23 0841 98 %     Weight --      Height --      Head Circumference --      Peak Flow --      Pain Score 01/10/23 0838 0     Pain Loc --      Pain Education --      Exclude from Growth Chart --    No data found.  Updated Vital Signs BP 113/80 (BP Location: Right Arm)   Pulse 74   Temp 98.6 F (37 C) (Oral)   Resp 18   LMP 12/19/2022   SpO2 98%   Visual Acuity Right Eye Distance:   Left Eye Distance:   Bilateral Distance:    Right Eye Near:   Left Eye Near:    Bilateral Near:     Physical Exam Vitals reviewed.  Constitutional:      General: She is not in acute distress.    Appearance: She is not ill-appearing, toxic-appearing or diaphoretic.  Genitourinary:    Comments: There are no ulcerations or swelling or redness of the external perineum.  There is some white exudate that is adherent on the labia minora and majora.  This could be suppository material versus yeast.  Chaperone present at the time of exam Skin:    Coloration: Skin is not pale.  Neurological:     Mental Status: She is alert and oriented to person, place, and time.  Psychiatric:        Behavior: Behavior normal.      UC Treatments / Results  Labs (all labs ordered are listed, but only abnormal results are displayed) Labs Reviewed  CERVICOVAGINAL ANCILLARY ONLY    EKG   Radiology No results found.  Procedures Procedures (including critical care time)  Medications Ordered in UC Medications - No data to display  Initial Impression / Assessment and Plan / UC Course  I have reviewed the triage vital signs and the nursing notes.  Pertinent labs & imaging results that were available during my care of the patient were reviewed by me and considered in my medical decision making (see chart for details).        Vaginal swab was done at the time of exam.  We will notify her and treat per protocol any  positives.   I am going to go ahead and treat empirically for yeast vaginitis. Final Clinical Impressions(s) / UC Diagnoses   Final diagnoses:  Acute vaginitis     Discharge Instructions      We will notify you of any positive test on the swab  Take fluconazole 150 mg--1 tablet every 3 days for 2 doses       ED Prescriptions     Medication Sig Dispense Auth. Provider   fluconazole (DIFLUCAN) 150 MG tablet Take 1 tablet (150 mg total) by mouth every 3 (three) days for 2 doses. 2 tablet Marlinda Mike Janace Aris, MD      PDMP not reviewed this encounter.   Zenia Resides, MD 01/10/23 [phone]

## 2023-01-16 NOTE — Progress Notes (Unsigned)
ACUTE VISIT Chief Complaint  Patient presents with   Rash    X 2 months, nothing OTC helping. Breakouts that are red, itchy & raised.    HPI: [name] is a 27 y.o. female, who is here today complaining of *** HPI Evaluated here in the clinic on 01/02/2023, diagnosed with eczema and recommended triamcinolone cream. *** Review of Systems See other pertinent positives and negatives in HPI.  Current Outpatient Medications on File Prior to Visit  Medication Sig Dispense Refill   albuterol (VENTOLIN HFA) 108 (90 Base) MCG/ACT inhaler Inhale 2 puffs into the lungs every 6 (six) hours as needed for wheezing or shortness of breath. 6.7 g 2   ferrous sulfate 325 (65 FE) MG tablet Take 325 mg by mouth every other day.     fluticasone (FLONASE) 50 MCG/ACT nasal spray Place 2 sprays into both nostrils daily. 16 g 1   omega-3 acid ethyl esters (LOVAZA) 1 g capsule Take by mouth 2 (two) times daily.     Respiratory Therapy Supplies (NEBULIZER/TUBING/MOUTHPIECE) KIT Use as directed. 1 kit 3   triamcinolone cream (KENALOG) 0.1 % Apply 1 Application topically 2 (two) times daily. 45 g 0   Vitamin D-Vitamin K (VITAMIN K2-VITAMIN D3 PO) Take 2 drops by mouth daily. Omega 3 2 Drops =5000 Units     No current facility-administered medications on file prior to visit.   Past Medical History:  Diagnosis Date   Allergic rhinitis    Allergy    SEASONAL   Asthma    Complication of anesthesia    Low BP   Eczema    Environmental allergies    Age 80 years   Family history of adverse reaction to anesthesia    Mother went into coma after anesthesia. 4 days to wake up   Fatty liver 12/11/2018   Noted on CT 6/20   Generalized headaches    Migraines. 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   PVC (premature  ventricular contraction) 11/09/2022   Streptococcal infection(041.00)    Age 59 and 27 years old   TB lung, latent    2017   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/2018   Diffuse enlargement on [place] 6/20   No Known Allergies  Social History   Socioeconomic History   Marital status: Married    Spouse name: Not on file   Number of children: 2   Years of education: Not on file   Highest education level: Associate degree: occupational, Scientist, product/process development, or vocational program  Occupational History   Occupation: dietary  Tobacco Use   Smoking status: Never   Smokeless tobacco: Never  Vaping Use   Vaping status: Never Used  Substance and Sexual Activity   Alcohol use: Yes   Drug use: No   Sexual activity: Yes    Partners: Female    Birth control/protection: None    Comment: female partner  Other Topics Concern   Not on file  Social History Narrative   Patient works at [employer], [employer]   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    Social Determinants of Health   Financial Resource Strain: Low Risk  (01/18/2023)  Overall Financial Resource Strain (CARDIA)    Difficulty of Paying Living Expenses: Not hard at all  Food Insecurity: No Food Insecurity (01/18/2023)   Hunger Vital Sign    Worried About Running Out of Food in the Last Year: Never true    Ran Out of Food in the Last Year: Never true  Transportation Needs: No Transportation Needs (01/18/2023)   PRAPARE - Administrator, Civil Service (Medical): No    Lack of Transportation (Non-Medical): No  Physical Activity: Insufficiently Active (01/18/2023)   Exercise Vital Sign    Days of Exercise per Week: 3 days    Minutes of Exercise per Session: 30 min  Stress: No Stress Concern Present (01/18/2023)   [employer] of Occupational Health - Occupational Stress Questionnaire    Feeling of Stress : Not at all  Social Connections: Moderately Isolated  (01/18/2023)   Social Connection and Isolation Panel [NHANES]    Frequency of Communication with Friends and Family: Three times a week    Frequency of Social Gatherings with Friends and Family: Twice a week    Attends Religious Services: Never    Database administrator or Organizations: No    Attends Engineer, structural: Not on file    Marital Status: Married    Vitals:   01/18/23 1059  BP: 110/70  Pulse: 73  Resp: 12  Temp: 98.8 F (37.1 C)  SpO2: 98%   Body mass index is 25.11 kg/m.  Physical Exam Vitals reviewed.  Constitutional:      General: She is not in acute distress.    Appearance: She is well-developed.  HENT:     Head: Atraumatic.  Eyes:     Conjunctiva/sclera: Conjunctivae normal.  Pulmonary:     Effort: Pulmonary effort is normal. No respiratory distress.     Breath sounds: Normal breath sounds.  Lymphadenopathy:     Cervical: No cervical adenopathy.  Skin:    General: Skin is warm.     Findings: Rash present. Rash is papular and pustular.       Neurological:     Mental Status: She is alert and oriented to person, place, and time.  Psychiatric:        Speech: Speech normal.     Comments: Well groomed, good eye contact.   ASSESSMENT AND PLAN:  Acne vulgaris -     Clindamycin Phos-Benzoyl Perox; Apply topically 2 (two) times daily.  Dispense: 35 g; Refill: 1  Papular rash, localized -     Ambulatory referral to Dermatology  We discussed possible etiologies. History and examination today do not suggest serious process. RPR negative earlier this year. ***  Return if symptoms worsen or fail to improve, for keep next appointment.   G. [place], MD  [facility]. [place] office.

## 2023-01-18 ENCOUNTER — Ambulatory Visit: Payer: Commercial Managed Care - HMO | Admitting: Family Medicine

## 2023-01-18 VITALS — BP 110/70 | HR 73 | Temp 98.8°F | Resp 12 | Ht <= 58 in | Wt 120.1 lb

## 2023-01-18 DIAGNOSIS — L700 Acne vulgaris: Secondary | ICD-10-CM

## 2023-01-18 DIAGNOSIS — R21 Rash and other nonspecific skin eruption: Secondary | ICD-10-CM

## 2023-01-18 MED ORDER — CLINDAMYCIN PHOS-BENZOYL PEROX 1-5 % EX GEL: Freq: Two times a day (BID) | CUTANEOUS | 1 refills | Status: DC

## 2023-01-18 NOTE — Patient Instructions (Addendum)
A few things to remember from today's visit:  Acne vulgaris - Plan: clindamycin-benzoyl peroxide (BENZACLIN) gel  Papular rash, localized - Plan: Ambulatory referral to Dermatology Stop Triamcinolone . Try clindamycin and benzoyl gel.  If you need refills for medications you take chronically, please call your pharmacy. Do not use My Chart to request refills or for acute issues that need immediate attention. If you send a my chart message, it may take a few days to be addressed, specially if I am not in the office.  Please be sure medication list is accurate. If a new problem present, please set up appointment sooner than planned today.

## 2023-02-14 ENCOUNTER — Ambulatory Visit (INDEPENDENT_AMBULATORY_CARE_PROVIDER_SITE_OTHER): Payer: Commercial Managed Care - HMO

## 2023-02-14 ENCOUNTER — Ambulatory Visit (HOSPITAL_COMMUNITY)
Admission: RE | Admit: 2023-02-14 | Discharge: 2023-02-14 | Disposition: A | Discharge status: Home / Self Care | Payer: Commercial Managed Care - HMO | Source: Ambulatory Visit | Attending: Family Medicine | Admitting: Family Medicine

## 2023-02-14 ENCOUNTER — Encounter (HOSPITAL_COMMUNITY): Payer: Self-pay

## 2023-02-14 VITALS — BP 130/81 | HR 71 | Temp 98.3°F | Resp 18

## 2023-02-14 DIAGNOSIS — M25552 Pain in left hip: Secondary | ICD-10-CM

## 2023-02-14 MED ORDER — CYCLOBENZAPRINE HCL 10 MG PO TABS: ORAL_TABLET | ORAL | 0 refills | Status: DC

## 2023-02-14 MED ORDER — PREDNISONE 20 MG PO TABS: 40.0000 mg | ORAL_TABLET | Freq: Every day | ORAL | 0 refills | Status: DC

## 2023-02-14 NOTE — ED Provider Notes (Signed)
[facility]   [id number] 02/14/23 Arrival Time: 1522  ASSESSMENT & PLAN:  1. Left hip pain    I have personally viewed and independently interpreted the imaging studies ordered this visit. L hip: no bony abnormalities appreciated..  Trial of. Meds ordered this encounter  Medications   predniSONE (DELTASONE) 20 MG tablet    Sig: Take 2 tablets (40 mg total) by mouth daily.    Dispense:  10 tablet    Refill:  0   cyclobenzaprine (FLEXERIL) 10 MG tablet    Sig: Take 1 tablet by mouth before bed as needed for muscle spasm. Warning: May cause drowsiness.    Dispense:  10 tablet    Refill:  0   Has orthopaedist appt on 9/11. To keep.  Reviewed expectations re: course of current medical issues. Questions answered. Outlined signs and symptoms indicating need for more acute intervention. Patient verbalized understanding. After Visit Summary given.  SUBJECTIVE: History from: patient. Maria Smith is a 27 y.o. female who reports left quadriceps and hip pain; x 72 hours; noted after twisting leg as exiting bed to check on children. Denies direct trauma to leg.  Normal bowel/bladder habits. No extremity sensation changes or weakness.  Ibuprofen without much relief.  Past Surgical History:  Procedure Laterality Date   ESOPHAGOGASTRODUODENOSCOPY ENDOSCOPY  2021   HIP ARTHROSCOPY Left 03/20/2022   Procedure: LEFT HIP ARTHROSCOPY WITH LABRAL REPAIR;  Surgeon: Huel Cote, MD;  Location: [facility] OR;  Service: Orthopedics;  Laterality: Left;   TONSILLECTOMY  06/18/2010   WISDOM TOOTH EXTRACTION       OBJECTIVE:  Vitals:   02/14/23 1601  BP: 130/81  Pulse: 71  Resp: 18  Temp: 98.3 F (36.8 C)  TempSrc: Oral  SpO2: 98%    General appearance: alert; no distress HEENT: [place]; AT Neck: supple with FROM Resp: unlabored respirations Extremities: LLE: warm with well perfused appearance; poorly localized moderate tenderness over left lateral hip; without gross  deformities; swelling: none; bruising: none; hip ROM: normal, with discomfort Skin: warm and dry; no visible rashes Neurologic: gait normal; normal sensation and strength of LLE Psychological: alert and cooperative; normal mood and affect  Imaging: DG Hip Unilat W or Wo Pelvis 1 View Left  Result Date: 02/14/2023 CLINICAL DATA:  Left hip and leg pain after falling out of bed on Monday. History of left hip surgery. EXAM: DG HIP (WITH OR WITHOUT PELVIS) 1V*L* COMPARISON:  Left hip x-rays dated Oct 27, 2021. FINDINGS: There is no evidence of hip fracture or dislocation. There is no evidence of arthropathy or other focal bone abnormality. IMPRESSION: Negative. Electronically Signed   By: Obie Dredge M.D.   On: 02/14/2023 17:09       No Known Allergies  Past Medical History:  Diagnosis Date   Allergic rhinitis    Allergy    SEASONAL   Asthma    Complication of anesthesia    Low BP   Eczema    Environmental allergies    Age 2 years   Family history of adverse reaction to anesthesia    Mother went into coma after anesthesia. 4 days to wake up   Fatty liver 12/11/2018   Noted on CT 6/20   Generalized headaches    Migraines. 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 since 08/2015   PVC (premature ventricular contraction) 11/09/2022   Streptococcal infection(041.00)    Age 35 and 27 years old   TB lung, latent    2017   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/2018   Diffuse enlargement on [place] 6/20   Social History   Socioeconomic History   Marital status: Married    Spouse name: Not on file   Number of children: 2   Years of education: Not on file   Highest education level: Associate degree: occupational, Scientist, product/process development, or vocational program  Occupational History   Occupation: dietary  Tobacco Use   Smoking status: Never    Smokeless tobacco: Never  Vaping Use   Vaping status: Never Used  Substance and Sexual Activity   Alcohol use: Yes   Drug use: No   Sexual activity: Yes    Partners: Female    Birth control/protection: None    Comment: female partner  Other Topics Concern   Not on file  Social History Narrative   Patient works at [employer], [employer]   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    Social Determinants of Health   Financial Resource Strain: Low Risk  (01/18/2023)   Overall Financial Resource Strain (CARDIA)    Difficulty of Paying Living Expenses: Not hard at all  Food Insecurity: No Food Insecurity (01/18/2023)   Hunger Vital Sign    Worried About Running Out of Food in the Last Year: Never true    Ran Out of Food in the Last Year: Never true  Transportation Needs: No Transportation Needs (01/18/2023)   PRAPARE - Administrator, Civil Service (Medical): No    Lack of Transportation (Non-Medical): No  Physical Activity: Insufficiently Active (01/18/2023)   Exercise Vital Sign    Days of Exercise per Week: 3 days    Minutes of Exercise per Session: 30 min  Stress: No Stress Concern Present (01/18/2023)   [employer] of Occupational Health - Occupational Stress Questionnaire    Feeling of Stress : Not at all  Social Connections: Moderately Isolated (01/18/2023)   Social Connection and Isolation Panel [NHANES]    Frequency of Communication with Friends and Family: Three times a week    Frequency of Social Gatherings with Friends and Family: Twice a week    Attends Religious Services: Never    Database administrator or Organizations: No    Attends Engineer, structural: Not on file    Marital Status: Married   Family History  Problem Relation Age of Onset   Migraines Paternal Grandmother    Migraines Paternal Grandfather    HIV Father    Diabetes Mother    Hypertension  Mother    Anemia Mother    COPD Mother    CVA Mother    Congestive Heart Failure Mother    Cirrhosis Mother    Obesity Mother    Irritable bowel syndrome Mother    Rheum arthritis Mother    Ulcers Brother    Anemia Sister    Cholecystitis Brother    Migraines Paternal Aunt    Migraines Maternal Uncle        Maternal Great Uncle   Diabetes Other        Family History   Hypertension Other        Family History   Depression Other  Family History   Asthma Other        Family History   Allergic rhinitis Other        Family History   Asthma Brother    Colon cancer Neg Hx    Colon polyps Neg Hx    Esophageal cancer Neg Hx    Rectal cancer Neg Hx    Stomach cancer Neg Hx    Past Surgical History:  Procedure Laterality Date   ESOPHAGOGASTRODUODENOSCOPY ENDOSCOPY  2021   HIP ARTHROSCOPY Left 03/20/2022   Procedure: LEFT HIP ARTHROSCOPY WITH LABRAL REPAIR;  Surgeon: Huel Cote, MD;  Location: [facility] OR;  Service: Orthopedics;  Laterality: Left;   TONSILLECTOMY  06/18/2010   WISDOM TOOTH EXTRACTION         Mardella Layman, MD 02/14/23 2037

## 2023-02-14 NOTE — Discharge Instructions (Signed)
Keep your follow up with your orthopaedist.

## 2023-02-14 NOTE — ED Triage Notes (Signed)
Pt states she had left leg surgery and she made an appt to see that providers but he doesn't have anything 02/27/2023. She states her left leg and hip and hurting since she fell out of bed Monday and came down on her left knee. She is taking tylneol without relief.

## 2023-02-20 ENCOUNTER — Other Ambulatory Visit (HOSPITAL_BASED_OUTPATIENT_CLINIC_OR_DEPARTMENT_OTHER): Payer: Self-pay

## 2023-02-20 MED ORDER — FLULAVAL 0.5 ML IM SUSY
PREFILLED_SYRINGE | INTRAMUSCULAR | 0 refills | Status: DC
  Filled 2023-02-20: qty 0.5, 1d supply, fill #0

## 2023-02-27 ENCOUNTER — Ambulatory Visit (INDEPENDENT_AMBULATORY_CARE_PROVIDER_SITE_OTHER): Payer: Commercial Managed Care - HMO | Admitting: Orthopaedic Surgery

## 2023-02-27 DIAGNOSIS — M7062 Trochanteric bursitis, left hip: Secondary | ICD-10-CM

## 2023-02-27 DIAGNOSIS — M25552 Pain in left hip: Secondary | ICD-10-CM

## 2023-02-27 MED ORDER — LIDOCAINE HCL 1 % IJ SOLN
4.0000 mL | INTRAMUSCULAR | Status: AC | PRN
Start: 2023-02-27 — End: 2023-02-27
  Administered 2023-02-27: 4 mL

## 2023-02-27 MED ORDER — TRIAMCINOLONE ACETONIDE 40 MG/ML IJ SUSP
80.0000 mg | INTRAMUSCULAR | Status: AC | PRN
Start: 2023-02-27 — End: 2023-02-27
  Administered 2023-02-27: 80 mg via INTRA_ARTICULAR

## 2023-02-27 NOTE — Progress Notes (Signed)
Post Operative Evaluation    Procedure/Date of Surgery: Left hip arthroscopy with labral repair 03/20/2022  Interval History:   Presents today status post left hip arthroscopy.  She is somewhat concerned as she did have a fall directly on his left side.  She is experiencing pain about the greater trochanter and left SI joint.  PMH/PSH/Family History/Social History/Meds/Allergies:    Past Medical History:  Diagnosis Date  . Allergic rhinitis   . Allergy    SEASONAL  . Asthma   . Complication of anesthesia    Low BP  . Eczema   . Environmental allergies    Age 27 years  . Family history of adverse reaction to anesthesia    Mother went into coma after anesthesia. 4 days to wake up  . Fatty liver 12/11/2018   Noted on CT 6/20  . Generalized headaches    Migraines. Began at age 39 years  . GERD (gastroesophageal reflux disease)   . Influenza    Age 27 years  . Mitral valve regurgitation   . PAC (premature atrial contraction) 11/09/2022  . 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 PVC (premature ventricular contraction) 11/09/2022  . Streptococcal infection(041.00)    Age 27 and 27 years old  . TB lung, latent    2017  . 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/2018   Diffuse enlargement on [place] 6/20   Past Surgical History:  Procedure Laterality Date  . ESOPHAGOGASTRODUODENOSCOPY ENDOSCOPY  2021  . HIP ARTHROSCOPY Left 03/20/2022   Procedure: LEFT HIP ARTHROSCOPY WITH LABRAL REPAIR;  Surgeon: Huel Cote, MD;  Location: [facility] OR;  Service: Orthopedics;  Laterality: Left;  . TONSILLECTOMY  06/18/2010  . WISDOM TOOTH EXTRACTION     Social History   Socioeconomic History  . Marital status: Married    Spouse name: Not on file  . Number of children: 2  . Years of education: Not on file  . Highest education level: Associate degree: occupational, Scientist, product/process development, or vocational program  Occupational History  . Occupation: dietary  Tobacco Use  .  Smoking status: Never  . Smokeless tobacco: Never  Vaping Use  . Vaping status: Never Used  Substance and Sexual Activity  . Alcohol use: Yes  . Drug use: No  . Sexual activity: Yes    Partners: Female    Birth control/protection: None    Comment: female partner  Other Topics Concern  . 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    Social Determinants of Health   Financial Resource Strain: Low Risk  (01/18/2023)   Overall Financial Resource Strain (CARDIA)   . Difficulty of Paying Living Expenses: Not hard at all  Food Insecurity: No Food Insecurity (01/18/2023)   Hunger Vital Sign   . Worried About Programme researcher, broadcasting/film/video in the Last Year: Never true   . Ran Out of Food in the Last Year: Never true  Transportation Needs: No Transportation Needs (01/18/2023)   PRAPARE - Transportation   . Lack of Transportation (Medical): No   . Lack of Transportation (Non-Medical): No  Physical Activity: Insufficiently Active (01/18/2023)  Exercise Vital Sign   . Days of Exercise per Week: 3 days   . Minutes of Exercise per Session: 30 min  Stress: No Stress Concern Present (01/18/2023)   [employer] of Occupational Health - Occupational Stress Questionnaire   . Feeling of Stress : Not at all  Social Connections: Moderately Isolated (01/18/2023)   Social Connection and Isolation Panel [NHANES]   . Frequency of Communication with Friends and Family: Three times a week   . Frequency of Social Gatherings with Friends and Family: Twice a week   . Attends Religious Services: Never   . Active Member of Clubs or Organizations: No   . Attends Banker Meetings: Not on file   . Marital Status: Married   Family History  Problem Relation Age of Onset  . Migraines Paternal Grandmother   . Migraines Paternal Grandfather   . HIV  Father   . Diabetes Mother   . Hypertension Mother   . Anemia Mother   . COPD Mother   . CVA Mother   . Congestive Heart Failure Mother   . Cirrhosis Mother   . Obesity Mother   . Irritable bowel syndrome Mother   . Rheum arthritis Mother   . Ulcers Brother   . Anemia Sister   . Cholecystitis Brother   . Migraines Paternal Aunt   . Migraines Maternal Uncle        Maternal Great Uncle  . Diabetes Other        Family History  . Hypertension Other        Family History  . Depression Other        Family History  . Asthma Other        Family History  . Allergic rhinitis Other        Family History  . Asthma Brother   . Colon cancer Neg Hx   . Colon polyps Neg Hx   . Esophageal cancer Neg Hx   . Rectal cancer Neg Hx   . Stomach cancer Neg Hx    No Known Allergies Current Outpatient Medications  Medication Sig Dispense Refill  . cyclobenzaprine (FLEXERIL) 10 MG tablet Take 1 tablet by mouth before bed as needed for muscle spasm. Warning: May cause drowsiness. 10 tablet 0  . influenza vac split trivalent PF (FLULAVAL) 0.5 ML injection Inject into the muscle. 0.5 mL 0  . predniSONE (DELTASONE) 20 MG tablet Take 2 tablets (40 mg total) by mouth daily. 10 tablet 0   No current facility-administered medications for this visit.   No results found.  Review of Systems:   A ROS was performed including pertinent positives and negatives as documented in the HPI.    Musculoskeletal Exam:    Last menstrual period 01/17/2023.  Left hip incisions are well-healed.  Active forward elevation of the left hip is to 120 degrees with 30 degrees internal/external rotation with minimal to no pain.  There is no pain about the groin.  Able to straight leg raise of the left knee.  Remainder of distal neurosensory exam is intact 2+ dorsalis pedis pulse.  Left hip with tenderness palpation over the greater trochanter as well as the left posterior SI joint  Imaging:      I personally  reviewed and interpreted the radiographs.   Assessment:   status post left hip arthroscopy overall doing well.  She did have a crush injury on the left side consistent with trochanteric bursitis.  At today's visit I did recommend  an ultrasound-guided injection to this area.  I would also like to make referral to Dr. Shon Baton for a left posterior SI guided injection.  Plan :    -Left greater trochanteric injection Friday for consenting  Procedure Note  Patient: Maria Smith             Date of Birth: February 22, 1996           MRN: [id number]             Visit Date: 02/27/2023  Procedures: Visit Diagnoses: No diagnosis found.  Large Joint Inj: L greater trochanter on 02/27/2023 5:19 PM Indications: pain Details: 22 G 3.5 in needle, ultrasound-guided anterolateral approach  Arthrogram: No  Medications: 4 mL lidocaine 1 %; 80 mg triamcinolone acetonide 40 MG/ML Outcome: tolerated well, no immediate complications Procedure, treatment alternatives, risks and benefits explained, specific risks discussed. Consent was given by the patient. Immediately prior to procedure a time out was called to verify the correct patient, procedure, equipment, support staff and site/side marked as required. Patient was prepped and draped in the usual sterile fashion.           I personally saw and evaluated the patient, and participated in the management and treatment plan.  Huel Cote, MD Attending Physician, Orthopedic Surgery  This document was dictated using Dragon voice recognition software. A reasonable attempt at proof reading has been made to minimize errors.

## 2023-03-01 ENCOUNTER — Encounter: Payer: Self-pay | Admitting: Sports Medicine

## 2023-03-01 ENCOUNTER — Ambulatory Visit (INDEPENDENT_AMBULATORY_CARE_PROVIDER_SITE_OTHER): Payer: Commercial Managed Care - HMO | Admitting: Sports Medicine

## 2023-03-01 ENCOUNTER — Other Ambulatory Visit: Payer: Self-pay

## 2023-03-01 DIAGNOSIS — M533 Sacrococcygeal disorders, not elsewhere classified: Secondary | ICD-10-CM

## 2023-03-01 DIAGNOSIS — M7918 Myalgia, other site: Secondary | ICD-10-CM

## 2023-03-01 DIAGNOSIS — M25552 Pain in left hip: Secondary | ICD-10-CM

## 2023-03-01 DIAGNOSIS — G8929 Other chronic pain: Secondary | ICD-10-CM

## 2023-03-01 NOTE — Progress Notes (Signed)
Left SI injection

## 2023-03-01 NOTE — Progress Notes (Signed)
Procedure Note  Patient: Maria Smith             Date of Birth: [birth year]           MRN: [id number]             Visit Date: 03/01/2023  Procedures: Visit Diagnoses:  1. Left buttock pain   2. Pain in left hip   3. Chronic left SI joint pain    U/S-guided SI-joint injection, Left   After discussion of risk/benefits/indications, informed verbal consent was obtained. A timeout was then performed. The patient was positioned in a prone position on exam room table with a pillow placed under the pelvis for mild hip flexion. The SI joint area was cleaned and prepped with betadine and alcohol swabs. Sterile ultrasound gel was applied and the ultrasound transducer was placed in an anatomic axial plane over the PSIS, then moved distally over the SI-joint. Using ultrasound guidance, a 22-gauge, 3.5" needle was inserted from a medial to lateral approach utilizing an in-plane approach and directed into the SI-joint. The SI-joint was then injected with a mixture of 4:1 lidocaine:depomedrol with visualization of the injectate flow into the SI-joint under ultrasound visualization. The patient tolerated the procedure well without immediate complications.  - I evaluated the patient about 5 minutes post-injection and she had good improvement in pain and range of motion - "don't really feel anything" - follow-up with Dr. Steward Drone as indicated; I am happy to see them as needed  Madelyn Brunner, DO Primary Care Sports Medicine Physician  [facility] - Orthopedics  This note was dictated using Dragon naturally speaking software and may contain errors in syntax, spelling, or content which have not been identified prior to signing this note.

## 2023-03-08 ENCOUNTER — Encounter (HOSPITAL_COMMUNITY): Payer: Self-pay | Admitting: Emergency Medicine

## 2023-03-08 ENCOUNTER — Ambulatory Visit (HOSPITAL_COMMUNITY)
Admission: EM | Admit: 2023-03-08 | Discharge: 2023-03-08 | Disposition: A | Discharge status: Home / Self Care | Payer: Commercial Managed Care - HMO | Attending: Family Medicine | Admitting: Family Medicine

## 2023-03-08 DIAGNOSIS — H9202 Otalgia, left ear: Secondary | ICD-10-CM | POA: Diagnosis not present

## 2023-03-08 DIAGNOSIS — H6012 Cellulitis of left external ear: Secondary | ICD-10-CM

## 2023-03-08 MED ORDER — IBUPROFEN 100 MG/5ML PO SUSP: 600.0000 mg | Freq: Four times a day (QID) | ORAL | 0 refills | Status: DC | PRN

## 2023-03-08 MED ORDER — CEPHALEXIN 250 MG/5ML PO SUSR: 250.0000 mg | Freq: Three times a day (TID) | ORAL | 0 refills | Status: AC

## 2023-03-08 MED ORDER — IBUPROFEN 600 MG PO TABS: 600.0000 mg | ORAL_TABLET | Freq: Three times a day (TID) | ORAL | 0 refills | Status: DC | PRN

## 2023-03-08 MED ORDER — CEPHALEXIN 250 MG PO CAPS: 250.0000 mg | ORAL_CAPSULE | Freq: Three times a day (TID) | ORAL | 0 refills | Status: DC

## 2023-03-08 NOTE — ED Provider Notes (Addendum)
[facility]    CSN: [id number] Arrival date & time: 03/08/23  1626      History   Chief Complaint Chief Complaint  Patient presents with   Otalgia    HPI Maria Smith is a 27 y.o. female.    Otalgia Here for left ear pain.  Is been bothering her for about 2 days and then got more intense today.  She does note that her piercing along her upper pinna is irritated also.  No fever or chills and no cough or congestion. She does have a little fullness in her right ear.   Last menstrual cycle was August 3, but she is confident that she is not pregnant.    Past Medical History:  Diagnosis Date   Allergic rhinitis    Allergy    SEASONAL   Asthma    Complication of anesthesia    Low BP   Eczema    Environmental allergies    Age 72 years   Family history of adverse reaction to anesthesia    Mother went into coma after anesthesia. 4 days to wake up   Fatty liver 12/11/2018   Noted on CT 6/20   Generalized headaches    Migraines. 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 Rifampin since 08/2015   PVC (premature ventricular contraction) 11/09/2022   Streptococcal infection(041.00)    Age 49 and 27 years old   TB lung, latent    2017   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/2018   Diffuse enlargement on [place] 6/20    Patient Active Problem List   Diagnosis Date Noted   Eczema 01/02/2023   PAC (premature atrial contraction) 11/09/2022   PVC (premature ventricular contraction) 11/09/2022   Tear of left acetabular labrum    Pain in left hip 01/27/2021   Bruising 01/27/2021   Left-sided chest wall pain 03/23/2020   Positive ANA (antinuclear antibody) 03/23/2020   Asthma 02/10/2020   Allergic rhinitis 02/10/2020   GERD (gastroesophageal reflux disease) 02/10/2020   History of TB (tuberculosis) 08/21/2019   Throat  pain 01/28/2019   Odynophagia 01/28/2019   Neck pain 01/28/2019   Thyroiditis 01/12/2019   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/2018   Fatty liver 12/11/2018   PCOS (polycystic ovarian syndrome) 11/11/2018   Seasonal allergies 09/22/2018   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   Migraine with aura and without status migrainosus, not intractable 09/11/2012   Variants of migraine, not elsewhere classified, without mention of intractable migraine without mention of status migrainosus 09/11/2012   Unspecified constipation 09/11/2012   Syncope 08/31/2010   Neck Pain 08/31/2010   Insomnia 08/31/2010    Past Surgical History:  Procedure Laterality Date   ESOPHAGOGASTRODUODENOSCOPY ENDOSCOPY  2021   HIP ARTHROSCOPY Left 03/20/2022   Procedure: LEFT HIP ARTHROSCOPY WITH LABRAL REPAIR;  Surgeon: Huel Cote, MD;  Location: [facility];  Service: Orthopedics;  Laterality: Left;   TONSILLECTOMY  06/18/2010   WISDOM TOOTH EXTRACTION      OB History     Gravida  1   Para  0   Term  0   Preterm  0   AB  0   Living  0      SAB  0   IAB  0   Ectopic  0   Multiple  0   Live Births  0            Home Medications    Prior to Admission medications   Medication Sig Start Date End Date Taking? Authorizing Provider  cephALEXin (KEFLEX) 250 MG capsule Take 1 capsule (250 mg total) by mouth 3 (three) times daily for 7 days. 03/08/23 03/15/23 Yes Zenia Resides, MD  ibuprofen (ADVIL) 600 MG tablet Take 1 tablet (600 mg total) by mouth every 8 (eight) hours as needed (pain). 03/08/23  Yes Scott Fix, Janace Aris, MD    Family History Family History  Problem Relation Age of Onset   Migraines Paternal Grandmother    Migraines Paternal Grandfather    HIV Father    Diabetes Mother    Hypertension Mother    Anemia Mother    COPD Mother    CVA Mother    Congestive Heart Failure Mother    Cirrhosis Mother    Obesity Mother    Irritable bowel syndrome Mother    Rheum arthritis Mother    Ulcers Brother     Anemia Sister    Cholecystitis Brother    Migraines Paternal Aunt    Migraines Maternal Uncle        Maternal Great Uncle   Diabetes Other        Family History   Hypertension Other        Family History   Depression Other        Family History   Asthma Other        Family History   Allergic rhinitis Other        Family History   Asthma Brother    Colon cancer Neg Hx    Colon polyps Neg Hx    Esophageal cancer Neg Hx    Rectal cancer Neg Hx    Stomach cancer Neg Hx     Social History Social History   Tobacco Use   Smoking status: Never   Smokeless tobacco: Never  Vaping Use   Vaping status: Never Used  Substance Use Topics   Alcohol use: Not Currently   Drug use: No     Allergies   Patient has no known allergies.   Review of Systems Review of Systems  HENT:  Positive for ear pain.      Physical Exam Triage Vital Signs ED Triage Vitals  Encounter Vitals Group     BP 03/08/23 1656 110/74     Systolic BP Percentile --      Diastolic BP Percentile --      Pulse Rate 03/08/23 1656 68     Resp 03/08/23 1656 16     Temp 03/08/23 1656 97.9 F (36.6 C)     Temp Source 03/08/23 1656 Oral     SpO2 03/08/23 1656 98 %     Weight --      Height --      Head Circumference --      Peak Flow --      Pain Score 03/08/23 1653 8     Pain Loc --      Pain Education --      Exclude from Growth Chart --    No data found.  Updated Vital Signs BP 110/74 (BP Location: Left Arm)   Pulse 68   Temp 97.9 F (36.6 C) (Oral)   Resp 16   LMP 01/19/2023 (Approximate)   SpO2 98%   Visual Acuity Right Eye Distance:   Left Eye Distance:   Bilateral Distance:  Right Eye Near:   Left Eye Near:    Bilateral Near:     Physical Exam Vitals reviewed.  Constitutional:      General: She is not in acute distress.    Appearance: She is not ill-appearing, toxic-appearing or diaphoretic.  HENT:     Right Ear: Tympanic membrane and ear canal normal.     Left Ear:  Tympanic membrane and ear canal normal.     Ears:     Comments: Bilaterally tympanic membrane's are gray and shiny.  Canals are normal.  The piercing in her left upper pinna does have some erythema at the posterior aspect there is no drainage at this time.  There is also little swelling around that side of the pinna. Cardiovascular:     Rate and Rhythm: Normal rate and regular rhythm.  Skin:    Coloration: Skin is not pale.  Neurological:     Mental Status: She is alert and oriented to person, place, and time.  Psychiatric:        Behavior: Behavior normal.      UC Treatments / Results  Labs (all labs ordered are listed, but only abnormal results are displayed) Labs Reviewed - No data to display  EKG   Radiology No results found.  Procedures Procedures (including critical care time)  Medications Ordered in UC Medications - No data to display  Initial Impression / Assessment and Plan / UC Course  I have reviewed the triage vital signs and the nursing notes.  Pertinent labs & imaging results that were available during my care of the patient were reviewed by me and considered in my medical decision making (see chart for details).     Keflex is sent in for the possible cellulitis around the piercing of her left pinna.  Ibuprofen is sent in for the pain.  I do wonder if some of this is eustachian tube dysfunction, since some of her pain seem to be deeper than the pinna.  At discharge the patient requested liquid medications.  Liquid Keflex and liquid ibuprofen are sent instead. Final Clinical Impressions(s) / UC Diagnoses   Final diagnoses:  Otalgia of left ear  Cellulitis of left pinna     Discharge Instructions      Take cephalexin 250 mg--1 capsule 3 times daily for 7 days  Take ibuprofen 600 mg--1 tab every 8 hours as needed for pain.  You can do warm compresses to your left outer ear 2 or 3 times a day. Also put triple antibiotic ointment on the piercing on  your left ear.       ED Prescriptions     Medication Sig Dispense Auth. Provider   cephALEXin (KEFLEX) 250 MG capsule Take 1 capsule (250 mg total) by mouth 3 (three) times daily for 7 days. 21 capsule Zenia Resides, MD   ibuprofen (ADVIL) 600 MG tablet Take 1 tablet (600 mg total) by mouth every 8 (eight) hours as needed (pain). 15 tablet Jaxxen Voong, Janace Aris, MD      PDMP not reviewed this encounter.   Zenia Resides, MD 03/08/23 1709    Zenia Resides, MD 03/08/23 [phone]

## 2023-03-08 NOTE — ED Triage Notes (Signed)
Pt has sharp ear pain in left ear that started two days ago. Pt states she has some fullness in right ear but it is not painful

## 2023-03-08 NOTE — Discharge Instructions (Signed)
Take cephalexin 250 mg--1 capsule 3 times daily for 7 days  Take ibuprofen 600 mg--1 tab every 8 hours as needed for pain.  You can do warm compresses to your left outer ear 2 or 3 times a day. Also put triple antibiotic ointment on the piercing on your left ear.

## 2023-03-09 ENCOUNTER — Ambulatory Visit (HOSPITAL_COMMUNITY): Payer: Commercial Managed Care - HMO

## 2023-03-18 ENCOUNTER — Ambulatory Visit (INDEPENDENT_AMBULATORY_CARE_PROVIDER_SITE_OTHER): Payer: Managed Care, Other (non HMO) | Admitting: Family Medicine

## 2023-03-18 ENCOUNTER — Encounter: Payer: Self-pay | Admitting: Family Medicine

## 2023-03-18 VITALS — BP 110/70 | HR 70 | Temp 98.8°F | Resp 12 | Ht <= 58 in | Wt 124.4 lb

## 2023-03-18 DIAGNOSIS — N946 Dysmenorrhea, unspecified: Secondary | ICD-10-CM | POA: Diagnosis not present

## 2023-03-18 DIAGNOSIS — E559 Vitamin D deficiency, unspecified: Secondary | ICD-10-CM

## 2023-03-18 DIAGNOSIS — Z0189 Encounter for other specified special examinations: Secondary | ICD-10-CM | POA: Diagnosis not present

## 2023-03-18 DIAGNOSIS — N921 Excessive and frequent menstruation with irregular cycle: Secondary | ICD-10-CM | POA: Diagnosis not present

## 2023-03-18 LAB — CBC
HCT: 43.7 % (ref 36.0–46.0)
Hemoglobin: 14 g/dL (ref 12.0–15.0)
MCHC: 32.1 g/dL (ref 30.0–36.0)
MCV: 89.5 fL (ref 78.0–100.0)
Platelets: 369 10*3/uL (ref 150.0–400.0)
RBC: 4.89 Mil/uL (ref 3.87–5.11)
RDW: 13 % (ref 11.5–15.5)
WBC: 5.3 10*3/uL (ref 4.0–10.5)

## 2023-03-18 LAB — BASIC METABOLIC PANEL
BUN: 8 mg/dL (ref 6–23)
CO2: 28 meq/L (ref 19–32)
Calcium: 9.6 mg/dL (ref 8.4–10.5)
Chloride: 101 meq/L (ref 96–112)
Creatinine, Ser: 0.88 mg/dL (ref 0.40–1.20)
GFR: 89.94 mL/min (ref >60.00)
Glucose, Bld: 88 mg/dL (ref 70–99)
Potassium: 3.8 meq/L (ref 3.5–5.1)
Sodium: 137 meq/L (ref 135–145)

## 2023-03-18 LAB — IRON: Iron: 58 ug/dL (ref 42–145)

## 2023-03-18 LAB — FERRITIN: Ferritin: 21.7 ng/mL (ref 10.0–291.0)

## 2023-03-18 LAB — VITAMIN D 25 HYDROXY (VIT D DEFICIENCY, FRACTURES): VITD: 29.22 ng/mL — ABNORMAL LOW (ref 30.00–100.00)

## 2023-03-18 LAB — TSH: TSH: 2.06 u[IU]/mL (ref 0.35–5.50)

## 2023-03-18 MED ORDER — IBUPROFEN 600 MG PO TABS
ORAL_TABLET | ORAL | 1 refills | Status: DC
Start: 2023-03-18 — End: 2023-12-23

## 2023-03-18 MED ORDER — IBUPROFEN 600 MG PO TABS
ORAL_TABLET | ORAL | 1 refills | Status: DC
Start: 2023-03-18 — End: 2023-03-18

## 2023-03-18 NOTE — Progress Notes (Signed)
HPI: [name] is a 27 y.o. female with a PMHx significant for GERD, PCOS, asthma, and migraines who is here today requesting her iron and vitamin D levels to be checked.  Last seen on 01/18/2023 Hx of heavy and irregular menses. She states she has been having worsening heavy menstrual periods and dizziness with her periods with every cycle for the past year.  She endorses irregular cycles, saying there were 53 days between her LMP and the one before.   She has severe cramps which she describes as stabbing pains for a week and a half before periods. She endorses coldness. She follows with her cardiologist for palpitations. She denies chest pain, SOB, or cravings (pica).   She sees her gynecologist annually, and has an appointment for the end of October/2024.  She takes a women's vitamin.  She has not tried oral hormones or contraceptives before and not interested in doing so because she would like to try to conceive this year. She has been on infertility treatment in the past and was not successful. She already has an appt with infertility clinic.  Review of Systems  Constitutional:  Negative for activity change, appetite change, chills and fever.  Respiratory:  Negative for cough and wheezing.   Cardiovascular:  Negative for leg swelling.  Gastrointestinal:  Negative for abdominal pain, blood in stool, nausea and vomiting.  Genitourinary:  Positive for menstrual problem. Negative for decreased urine volume, dysuria and hematuria.  Musculoskeletal:  Negative for arthralgias and joint swelling.  Skin:  Negative for rash.  Neurological:  Negative for syncope and facial asymmetry.  Hematological:  Negative for adenopathy. Does not bruise/bleed easily.  See other pertinent positives and negatives in HPI.  No current outpatient medications on file prior to visit.   No current facility-administered medications on file prior to visit.   Past Medical History:  Diagnosis Date    Allergic rhinitis    Allergy    SEASONAL   Asthma    Complication of anesthesia    Low BP   Eczema    Environmental allergies    Age 69 years   Family history of adverse reaction to anesthesia    Mother went into coma after anesthesia. 4 days to wake up   Fatty liver 12/11/2018   Noted on CT 6/20   Generalized headaches    Migraines. 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 Rifampin since 08/2015   PVC (premature ventricular contraction) 11/09/2022   Streptococcal infection(041.00)    Age 72 and 27 years old   TB lung, latent    2017   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/2018   Diffuse enlargement on [place] 6/20   No Known Allergies  Social History   Socioeconomic History   Marital status: Married    Spouse name: Not on file   Number of children: 2   Years of education: Not on file   Highest education level: Associate degree: occupational, Scientist, product/process development, or vocational program  Occupational History   Occupation: dietary  Tobacco Use   Smoking status: Never   Smokeless tobacco: Never  Vaping Use   Vaping status: Never Used  Substance and Sexual Activity   Alcohol use: Not Currently   Drug use: No   Sexual activity: Yes    Partners: Female  Birth control/protection: None    Comment: female partner  Other Topics Concern   Not on file  Social History Narrative   Patient works at [employer], [employer]   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    Social Determinants of Health   Financial Resource Strain: Low Risk  (01/18/2023)   Overall Financial Resource Strain (CARDIA)    Difficulty of Paying Living Expenses: Not hard at all  Food Insecurity: No Food Insecurity (01/18/2023)   Hunger Vital Sign    Worried About Running Out of Food in the  Last Year: Never true    Ran Out of Food in the Last Year: Never true  Transportation Needs: No Transportation Needs (01/18/2023)   PRAPARE - Administrator, Civil Service (Medical): No    Lack of Transportation (Non-Medical): No  Physical Activity: Insufficiently Active (01/18/2023)   Exercise Vital Sign    Days of Exercise per Week: 3 days    Minutes of Exercise per Session: 30 min  Stress: No Stress Concern Present (01/18/2023)   [employer] of Occupational Health - Occupational Stress Questionnaire    Feeling of Stress : Not at all  Social Connections: Moderately Isolated (01/18/2023)   Social Connection and Isolation Panel [NHANES]    Frequency of Communication with Friends and Family: Three times a week    Frequency of Social Gatherings with Friends and Family: Twice a week    Attends Religious Services: Never    Database administrator or Organizations: No    Attends Engineer, structural: Not on file    Marital Status: Married    Vitals:   03/18/23 0732  BP: 110/70  Pulse: 70  Resp: 12  Temp: 98.8 F (37.1 C)  SpO2: 98%   Body mass index is 25.99 kg/m.  Physical Exam Vitals and nursing note reviewed.  Constitutional:      General: She is not in acute distress.    Appearance: She is well-developed.  HENT:     Head: Normocephalic and atraumatic.     Mouth/Throat:     Mouth: Mucous membranes are moist.  Eyes:     Conjunctiva/sclera: Conjunctivae normal.  Cardiovascular:     Rate and Rhythm: Normal rate and regular rhythm.     Pulses:          Posterior tibial pulses are 2+ on the right side and 2+ on the left side.     Heart sounds: No murmur heard. Pulmonary:     Effort: Pulmonary effort is normal. No respiratory distress.     Breath sounds: Normal breath sounds.  Abdominal:     Palpations: Abdomen is soft. There is no hepatomegaly or mass.     Tenderness: There is no abdominal tenderness.  Musculoskeletal:     Right lower leg: No edema.      Left lower leg: No edema.  Lymphadenopathy:     Cervical: No cervical adenopathy.  Skin:    General: Skin is warm.     Findings: No erythema or rash.  Neurological:     General: No focal deficit present.     Mental Status: She is alert and oriented to person, place, and time.     Gait: Gait normal.  Psychiatric:        Mood and Affect: Mood and affect normal.   ASSESSMENT AND PLAN:  Ms.  Maria Smith was seen today for a follow up on her iron and vitamin D levels.   Orders Placed This Encounter  Procedures   Basic metabolic panel   TSH   CBC   Iron   Ferritin   VITAMIN D 25 Hydroxy (Vit-D Deficiency, Fractures)   Lab Results  Component Value Date   WBC 5.3 03/18/2023   HGB 14.0 03/18/2023   HCT 43.7 03/18/2023   MCV 89.5 03/18/2023   PLT 369.0 03/18/2023   Lab Results  Component Value Date   TSH 2.06 03/18/2023   Lab Results  Component Value Date   NA 137 03/18/2023   CL 101 03/18/2023   K 3.8 03/18/2023   CO2 28 03/18/2023   BUN 8 03/18/2023   CREATININE 0.88 03/18/2023   GFR 89.94 03/18/2023   CALCIUM 9.6 03/18/2023   ALBUMIN 4.0 03/05/2022   GLUCOSE 88 03/18/2023   Dysmenorrhea Assessment & Plan: Recommend ibuprofen 600 mg 3 times daily with food, starting 2 to 3 days before menses and to take for up to 5 days. She knows to discontinue if she is suspecting pregnancy. Continue following with gynecologist.  Orders: -     Basic metabolic panel; Future -     Ibuprofen; To take 1 tablet tid with food 2-3 days before menses for 5 days.  Dispense: 30 tablet; Refill: 1  Menorrhagia with irregular cycle Assessment & Plan: Hx of PCOS. Discussed treatment options, she is not interested in hormonal therapy because she would like to try to conceive. She has an appointment with infertility clinic. Instructed to keep appointment with her gynecologist. She agrees with ibuprofen 600 mg to start 2 to 3 days before menses, we discussed some side effects.  Orders: -      Basic metabolic panel; Future -     TSH; Future -     CBC; Future -     Iron; Future -     Ferritin; Future -     Ibuprofen; To take 1 tablet tid with food 2-3 days before menses for 5 days.  Dispense: 30 tablet; Refill: 1  Patient requested diagnostic testing -     VITAMIN D 25 Hydroxy (Vit-D Deficiency, Fractures); Future  Vitamin D insufficiency  25 OH vitamin D slightly abnormal, 29.2. Will recommend OTC vitamin D supplementation at 1000 units daily.  Return if symptoms worsen or fail to improve, for keep next appointment.  I, Maria Smith, acting as a scribe for Chijioke Lasser [place], MD., have documented all relevant documentation on the behalf of Maria Bolle [place], MD, as directed by  Maria Ertle [place], MD while in the presence of Maria Newstrom [place], MD.   I, Shaquisha Wynn [place], MD, have reviewed all documentation for this visit. The documentation on 03/18/23 for the exam, diagnosis, procedures, and orders are all accurate and complete.  Christyn Gutkowski G. [place], MD  [facility]. [place] office.

## 2023-03-18 NOTE — Patient Instructions (Addendum)
A few things to remember from today's visit:  Dysmenorrhea - Plan: Basic metabolic panel  Menorrhagia with irregular cycle - Plan: Basic metabolic panel, TSH, CBC, Iron, Ferritin  You could try Ibuprofen 600 mg 3 times daily with food 2-3 days before menses to help with pain and menstrual flow. Keep appt with gynecologist.  If you need refills for medications you take chronically, please call your pharmacy. Do not use My Chart to request refills or for acute issues that need immediate attention. If you send a my chart message, it may take a few days to be addressed, specially if I am not in the office.  Please be sure medication list is accurate. If a new problem present, please set up appointment sooner than planned today.

## 2023-03-18 NOTE — Assessment & Plan Note (Signed)
Hx of PCOS. Discussed treatment options, she is not interested in hormonal therapy because she would like to try to conceive. She has an appointment with infertility clinic. Instructed to keep appointment with her gynecologist. She agrees with ibuprofen 600 mg to start 2 to 3 days before menses, we discussed some side effects.

## 2023-03-18 NOTE — Assessment & Plan Note (Signed)
Recommend ibuprofen 600 mg 3 times daily with food, starting 2 to 3 days before menses and to take for up to 5 days. She knows to discontinue if she is suspecting pregnancy. Continue following with gynecologist.

## 2023-04-15 ENCOUNTER — Ambulatory Visit (HOSPITAL_COMMUNITY)
Admission: EM | Admit: 2023-04-15 | Discharge: 2023-04-15 | Disposition: A | Discharge status: Home / Self Care | Payer: Managed Care, Other (non HMO) | Attending: Family Medicine | Admitting: Family Medicine

## 2023-04-15 ENCOUNTER — Other Ambulatory Visit (HOSPITAL_COMMUNITY): Payer: Self-pay

## 2023-04-15 DIAGNOSIS — R112 Nausea with vomiting, unspecified: Secondary | ICD-10-CM | POA: Diagnosis not present

## 2023-04-15 MED ORDER — ONDANSETRON 4 MG PO TBDP
4.0000 mg | ORAL_TABLET | Freq: Three times a day (TID) | ORAL | 0 refills | Status: DC | PRN
  Filled 2023-04-15: qty 21, 7d supply, fill #0

## 2023-04-15 MED ORDER — ONDANSETRON 4 MG PO TBDP: 4.0000 mg | ORAL_TABLET | Freq: Three times a day (TID) | ORAL | 0 refills | Status: DC | PRN

## 2023-04-15 NOTE — ED Triage Notes (Signed)
Pt states she has been sick since Sunday morning. She c/o diarrhea, vomiting 2 times, and abdominal cramps.  Her daughter was sick with fever last week.

## 2023-04-15 NOTE — ED Provider Notes (Signed)
[facility]    CSN: [id number] Arrival date & time: 04/15/23  0844      History   Chief Complaint No chief complaint on file.   HPI Maria Smith is a 27 y.o. female.   Patient is presenting with a 1 day history of nausea vomiting.  Patient states that her daughter has been sick and that yesterday she started noticing she was having some nausea and she threw up twice yesterday.  Patient states that she went to work today but states that the nausea was unbearable and she had to leave.  Patient also notes that she has been having some diarrhea as well as some abdominal cramping.  At this time there is no blood in the stool or in the emesis.  Patient states that no matter what she tries to eat or drink she gets nauseous.  Patient denies any fevers or chills.  Patient has no other concerns at this time.     Past Medical History:  Diagnosis Date   Allergic rhinitis    Allergy    SEASONAL   Asthma    Complication of anesthesia    Low BP   Eczema    Environmental allergies    Age 57 years   Family history of adverse reaction to anesthesia    Mother went into coma after anesthesia. 4 days to wake up   Fatty liver 12/11/2018   Noted on CT 6/20   Generalized headaches    Migraines. 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 Rifampin since 08/2015   PVC (premature ventricular contraction) 11/09/2022   Streptococcal infection(041.00)    Age 57 and 27 years old   TB lung, latent    2017   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/2018   Diffuse enlargement on [place] 6/20    Patient Active Problem List   Diagnosis Date Noted   Menorrhagia with irregular cycle 03/18/2023   Dysmenorrhea 03/18/2023   Vitamin D insufficiency 03/18/2023   Eczema 01/02/2023   PAC (premature atrial contraction) 11/09/2022   PVC (premature ventricular  contraction) 11/09/2022   Tear of left acetabular labrum    Pain in left hip 01/27/2021   Bruising 01/27/2021   Left-sided chest wall pain 03/23/2020   Positive ANA (antinuclear antibody) 03/23/2020   Asthma 02/10/2020   Allergic rhinitis 02/10/2020   GERD (gastroesophageal reflux disease) 02/10/2020   History of TB (tuberculosis) 08/21/2019   Throat pain 01/28/2019   Odynophagia 01/28/2019   Neck pain 01/28/2019   Thyroiditis 01/12/2019   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/2018   Fatty liver 12/11/2018   PCOS (polycystic ovarian syndrome) 11/11/2018   Seasonal allergies 09/22/2018   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   Migraine with aura and without status migrainosus, not intractable 09/11/2012   Migraine variant 09/11/2012   Constipation 09/11/2012   Syncope 08/31/2010   Neck Pain 08/31/2010   Insomnia 08/31/2010    Past Surgical History:  Procedure Laterality Date   ESOPHAGOGASTRODUODENOSCOPY ENDOSCOPY  2021   HIP ARTHROSCOPY Left 03/20/2022   Procedure: LEFT HIP ARTHROSCOPY WITH LABRAL REPAIR;  Surgeon: Huel Cote, MD;  Location: [facility];  Service: Orthopedics;  Laterality: Left;   TONSILLECTOMY  06/18/2010   WISDOM TOOTH EXTRACTION      OB History     Gravida  1   Para  0  Term  0   Preterm  0   AB  0   Living  0      SAB  0   IAB  0   Ectopic  0   Multiple  0   Live Births  0            Home Medications    Prior to Admission medications   Medication Sig Start Date End Date Taking? Authorizing Provider  ondansetron (ZOFRAN-ODT) 4 MG disintegrating tablet Take 1 tablet (4 mg total) by mouth every 8 (eight) hours as needed for nausea or vomiting. 04/15/23  Yes Brenton Grills, MD  ibuprofen (ADVIL) 600 MG tablet To take 1 tablet tid with food 2-3 days before menses for 5 days. 03/18/23   [place], Betty G, MD    Family History Family History  Problem Relation Age of Onset   Migraines Paternal Grandmother    Migraines Paternal Grandfather    HIV Father    Diabetes  Mother    Hypertension Mother    Anemia Mother    COPD Mother    CVA Mother    Congestive Heart Failure Mother    Cirrhosis Mother    Obesity Mother    Irritable bowel syndrome Mother    Rheum arthritis Mother    Ulcers Brother    Anemia Sister    Cholecystitis Brother    Migraines Paternal Aunt    Migraines Maternal Uncle        Maternal Great Uncle   Diabetes Other        Family History   Hypertension Other        Family History   Depression Other        Family History   Asthma Other        Family History   Allergic rhinitis Other        Family History   Asthma Brother    Colon cancer Neg Hx    Colon polyps Neg Hx    Esophageal cancer Neg Hx    Rectal cancer Neg Hx    Stomach cancer Neg Hx     Social History Social History   Tobacco Use   Smoking status: Never   Smokeless tobacco: Never  Vaping Use   Vaping status: Never Used  Substance Use Topics   Alcohol use: Not Currently   Drug use: No     Allergies   Patient has no known allergies.   Review of Systems Review of Systems   Physical Exam Triage Vital Signs ED Triage Vitals  Encounter Vitals Group     BP 04/15/23 0949 102/71     Systolic BP Percentile --      Diastolic BP Percentile --      Pulse Rate 04/15/23 0949 70     Resp 04/15/23 0949 16     Temp 04/15/23 0949 98.5 F (36.9 C)     Temp Source 04/15/23 0949 Oral     SpO2 04/15/23 0949 98 %     Weight --      Height --      Head Circumference --      Peak Flow --      Pain Score 04/15/23 0948 8     Pain Loc --      Pain Education --      Exclude from Growth Chart --    No data found.  Updated Vital Signs BP 102/71 (BP Location: Left Arm)   Pulse 70  Temp 98.5 F (36.9 C) (Oral)   Resp 16   LMP 04/08/2023 (Approximate)   SpO2 98%   Visual Acuity Right Eye Distance:   Left Eye Distance:   Bilateral Distance:    Right Eye Near:   Left Eye Near:    Bilateral Near:     Physical Exam Constitutional:      General:  She is not in acute distress. Cardiovascular:     Rate and Rhythm: Normal rate and regular rhythm.     Pulses: Normal pulses.     Heart sounds: Normal heart sounds.  Pulmonary:     Effort: Pulmonary effort is normal.     Breath sounds: Normal breath sounds.  Abdominal:     General: Abdomen is flat.     Palpations: Abdomen is soft.     Tenderness: There is no abdominal tenderness.  Neurological:     Mental Status: She is alert.      UC Treatments / Results  Labs (all labs ordered are listed, but only abnormal results are displayed) Labs Reviewed - No data to display  EKG   Radiology No results found.  Procedures Procedures (including critical care time)  Medications Ordered in UC Medications - No data to display  Initial Impression / Assessment and Plan / UC Course  I have reviewed the triage vital signs and the nursing notes.  Pertinent labs & imaging results that were available during my care of the patient were reviewed by me and considered in my medical decision making (see chart for details).     Patient likely dealing with viral gastroenteritis.  At this time we will do supportive care and will prescribe patient Zofran.  Patient was advised to push fluids.  Patient given red flags symptoms including blood in stool or emesis. Final Clinical Impressions(s) / UC Diagnoses   Final diagnoses:  Nausea and vomiting, unspecified vomiting type     Discharge Instructions      Please take your Zofran and drink what ever fluids you can tolerate.  If you develop any worsening symptoms please come back to the urgent care or the ER.     ED Prescriptions     Medication Sig Dispense Auth. Provider   ondansetron (ZOFRAN-ODT) 4 MG disintegrating tablet Take 1 tablet (4 mg total) by mouth every 8 (eight) hours as needed for nausea or vomiting. 21 tablet Brenton Grills, MD      PDMP not reviewed this encounter.   Brenton Grills, MD 04/15/23 1045

## 2023-04-15 NOTE — Discharge Instructions (Addendum)
Please take your Zofran and drink what ever fluids you can tolerate.  If you develop any worsening symptoms please come back to the urgent care or the ER.

## 2023-04-18 ENCOUNTER — Ambulatory Visit: Payer: Commercial Managed Care - HMO | Admitting: Family Medicine

## 2023-07-22 ENCOUNTER — Telehealth (HOSPITAL_BASED_OUTPATIENT_CLINIC_OR_DEPARTMENT_OTHER): Payer: Self-pay | Admitting: Cardiovascular Disease

## 2023-07-22 NOTE — Telephone Encounter (Signed)
Attempted to call pt. Unable to leave VM at this time.

## 2023-07-22 NOTE — Telephone Encounter (Signed)
Patient sent a MyChart message through pt schedule request;  Comments: Consistent Palpitations

## 2023-07-23 ENCOUNTER — Encounter (HOSPITAL_BASED_OUTPATIENT_CLINIC_OR_DEPARTMENT_OTHER): Payer: Self-pay

## 2023-07-23 NOTE — Telephone Encounter (Signed)
Seen 10/2022 with reassuring workup. Recommended cardiology follow up PRN.   Called to inquire about symptoms, schedule appointment. No answer and unable to leave VM.  Alver Sorrow, NP

## 2023-07-24 ENCOUNTER — Telehealth: Payer: Self-pay | Admitting: Cardiovascular Disease

## 2023-07-24 NOTE — Telephone Encounter (Signed)
Called pt back. APP advised that c/o sounds like musculoskeletal pain. Pt can take ASA as pain reliever but is not of concern. Advised pt to follow MyChart message from [facility]. She verbalized understanding and was thankful for call.

## 2023-07-24 NOTE — Telephone Encounter (Signed)
 Called and spoke to pt; c/o palpitations while at rest at night Pt went up the stairs and felt dull aching pain to L side of sternum. Pt has been lifting weights lately and trying to become more active. Recently cut out caffeine  and sodas. Advised pt it could be musculoskeletal pain but will let providers know and call back with recommendations.  She verbalized understanding.

## 2023-07-24 NOTE — Telephone Encounter (Signed)
 Pt c/o of Chest Pain: STAT if CP now or developed within 24 hours  1. Are you having CP right now?  No   2. Are you experiencing any other symptoms (ex. SOB, nausea, vomiting, sweating)?  No   3. How long have you been experiencing CP?  Left-sided CP started about 2 hours ago   4. Is your CP continuous or coming and going?  Coming and going  5. Have you taken Nitroglycerin?  No, patient states she doesn't have any

## 2023-07-24 NOTE — Telephone Encounter (Signed)
 See phone note 2/5

## 2023-09-11 ENCOUNTER — Encounter (HOSPITAL_COMMUNITY): Payer: Self-pay

## 2023-09-11 ENCOUNTER — Ambulatory Visit (HOSPITAL_COMMUNITY)
Admission: EM | Admit: 2023-09-11 | Discharge: 2023-09-11 | Disposition: A | Discharge status: Home / Self Care | Attending: Sports Medicine | Admitting: Sports Medicine

## 2023-09-11 ENCOUNTER — Ambulatory Visit (INDEPENDENT_AMBULATORY_CARE_PROVIDER_SITE_OTHER)

## 2023-09-11 DIAGNOSIS — M25551 Pain in right hip: Secondary | ICD-10-CM | POA: Diagnosis not present

## 2023-09-11 MED ORDER — CYCLOBENZAPRINE HCL 10 MG PO TABS: 10.0000 mg | ORAL_TABLET | Freq: Two times a day (BID) | ORAL | 0 refills | Status: DC | PRN

## 2023-09-11 NOTE — Discharge Instructions (Addendum)
 You were seen today for hip pain. I suspect based on your exam this is a muscle strain of the hip flexors. X-rays looked good on my impression - I will contact you if the radiologist finds anything once they review it. I have referred you to physical therapy to rehab this. Recommend you keep your appt with Dr. Steward Drone on Monday. In the meantime I would avoid painful activities with the hip, use a heating pad as needed, take ibuprofen 600-800mg  8 hours apart. You may try the muscle relaxer (Flexeril) as well, would start just at night as this can be a sedating medication.

## 2023-09-11 NOTE — ED Provider Notes (Signed)
 [facility]    CSN: [id number] Arrival date & time: 09/11/23  0800      History   Chief Complaint Chief Complaint  Patient presents with   Hip Pain    HPI Maria Smith is a 28 y.o. female here with 5 days of right hip pain.  She denies any inciting injury but started noticing it after a day taking care of her kids at the playground.  She locates the pain to the anterior lateral aspect of the right hip.  It is causing her to walk with an antalgic gait.  She has a history of a left labral tear which she states feels similar to this though this is not quite as bad as that was.  That was repaired by Dr. Ballard Russell back in October 2023.  She tried ibuprofen but has not got much relief with that   Hip Pain    Past Medical History:  Diagnosis Date   Allergic rhinitis    Allergy    SEASONAL   Asthma    Complication of anesthesia    Low BP   Eczema    Environmental allergies    Age 79 years   Family history of adverse reaction to anesthesia    Mother went into coma after anesthesia. 4 days to wake up   Fatty liver 12/11/2018   Noted on CT 6/20   Generalized headaches    Migraines. 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 Rifampin since 08/2015   PVC (premature ventricular contraction) 11/09/2022   Streptococcal infection(041.00)    Age 62 and 28 years old   TB lung, latent    2017   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/2018   Diffuse enlargement on [place] 6/20    Patient Active Problem List   Diagnosis Date Noted   Menorrhagia with irregular cycle 03/18/2023   Dysmenorrhea 03/18/2023   Vitamin D insufficiency 03/18/2023   Eczema 01/02/2023   PAC (premature atrial contraction) 11/09/2022   PVC (premature ventricular contraction) 11/09/2022   Tear of left acetabular labrum    Pain in left hip 01/27/2021   Bruising  01/27/2021   Left-sided chest wall pain 03/23/2020   Positive ANA (antinuclear antibody) 03/23/2020   Asthma 02/10/2020   Allergic rhinitis 02/10/2020   GERD (gastroesophageal reflux disease) 02/10/2020   History of TB (tuberculosis) 08/21/2019   Throat pain 01/28/2019   Odynophagia 01/28/2019   Neck pain 01/28/2019   Thyroiditis 01/12/2019   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/2018   Fatty liver 12/11/2018   PCOS (polycystic ovarian syndrome) 11/11/2018   Seasonal allergies 09/22/2018   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   Migraine with aura and without status migrainosus, not intractable 09/11/2012   Migraine variant 09/11/2012   Constipation 09/11/2012   Syncope 08/31/2010   Neck Pain 08/31/2010   Insomnia 08/31/2010    Past Surgical History:  Procedure Laterality Date   ESOPHAGOGASTRODUODENOSCOPY ENDOSCOPY  2021   HIP ARTHROSCOPY Left 03/20/2022   Procedure: LEFT HIP ARTHROSCOPY WITH LABRAL REPAIR;  Surgeon: Huel Cote, MD;  Location: [facility];  Service: Orthopedics;  Laterality: Left;   TONSILLECTOMY  06/18/2010   WISDOM TOOTH EXTRACTION      OB History     Gravida  1   Para  0   Term  0   Preterm  0  AB  0   Living  0      SAB  0   IAB  0   Ectopic  0   Multiple  0   Live Births  0            Home Medications    Prior to Admission medications   Medication Sig Start Date End Date Taking? Authorizing Provider  cyclobenzaprine (FLEXERIL) 10 MG tablet Take 1 tablet (10 mg total) by mouth 2 (two) times daily as needed for muscle spasms. 09/11/23  Yes Marisa Cyphers, MD  ibuprofen (ADVIL) 600 MG tablet To take 1 tablet tid with food 2-3 days before menses for 5 days. 03/18/23   [place], Betty G, MD  ondansetron (ZOFRAN-ODT) 4 MG disintegrating tablet Dissolve 1 tablet (4 mg total) in mouth every 8 (eight) hours as needed for nausea and vomiting 04/15/23     ondansetron (ZOFRAN-ODT) 4 MG disintegrating tablet Take 1 tablet (4 mg total) by mouth every 8 (eight) hours as  needed for nausea or vomiting. 04/15/23   Brenton Grills, MD    Family History Family History  Problem Relation Age of Onset   Migraines Paternal Grandmother    Migraines Paternal Grandfather    HIV Father    Diabetes Mother    Hypertension Mother    Anemia Mother    COPD Mother    CVA Mother    Congestive Heart Failure Mother    Cirrhosis Mother    Obesity Mother    Irritable bowel syndrome Mother    Rheum arthritis Mother    Ulcers Brother    Anemia Sister    Cholecystitis Brother    Migraines Paternal Aunt    Migraines Maternal Uncle        Maternal Great Uncle   Diabetes Other        Family History   Hypertension Other        Family History   Depression Other        Family History   Asthma Other        Family History   Allergic rhinitis Other        Family History   Asthma Brother    Colon cancer Neg Hx    Colon polyps Neg Hx    Esophageal cancer Neg Hx    Rectal cancer Neg Hx    Stomach cancer Neg Hx     Social History Social History   Tobacco Use   Smoking status: Never   Smokeless tobacco: Never  Vaping Use   Vaping status: Never Used  Substance Use Topics   Alcohol use: Not Currently   Drug use: No     Allergies   Patient has no known allergies.   Review of Systems Review of Systems   Physical Exam Triage Vital Signs ED Triage Vitals  Encounter Vitals Group     BP 09/11/23 0817 107/73     Systolic BP Percentile --      Diastolic BP Percentile --      Pulse Rate 09/11/23 0817 60     Resp 09/11/23 0817 16     Temp 09/11/23 0817 98.6 F (37 C)     Temp Source 09/11/23 0817 Oral     SpO2 09/11/23 0817 98 %     Weight --      Height --      Head Circumference --      Peak Flow --      Pain Score  09/11/23 0818 5     Pain Loc --      Pain Education --      Exclude from Growth Chart --    No data found.  Updated Vital Signs BP 107/73 (BP Location: Left Arm)   Pulse 60   Temp 98.6 F (37 C) (Oral)   Resp 16   LMP 08/26/2023  (Approximate)   SpO2 98%   Visual Acuity Right Eye Distance:   Left Eye Distance:   Bilateral Distance:    Right Eye Near:   Left Eye Near:    Bilateral Near:     Physical Exam Constitutional:      General: She is not in acute distress.    Appearance: Normal appearance. She is normal weight. She is not toxic-appearing.  Cardiovascular:     Pulses: Normal pulses.  Pulmonary:     Effort: Pulmonary effort is normal.  Musculoskeletal:     Cervical back: Normal range of motion and neck supple.     Comments: Right Hip Exam: No deformity. Hips symmetrical.  Full range of motion with 4/5 strength on hip flexion and abduction. Tenderness to palpation anteriorly and slightly TTP over greater trochanter. No glute TTP or SI joint TTP. Neurovascularly intact distally. Negative logroll Negative faber, fadir, and piriformis stretches. + Stinchfield   Neurological:     General: No focal deficit present.     Mental Status: She is alert and oriented to person, place, and time.     Cranial Nerves: No cranial nerve deficit.     Motor: No weakness.      UC Treatments / Results  Labs (all labs ordered are listed, but only abnormal results are displayed) Labs Reviewed - No data to display  EKG   Radiology No results found.  Procedures Procedures (including critical care time)  Medications Ordered in UC Medications - No data to display  Initial Impression / Assessment and Plan / UC Course  I have reviewed the triage vital signs and the nursing notes.  Pertinent labs & imaging results that were available during my care of the patient were reviewed by me and considered in my medical decision making (see chart for details).    Patient is well-appearing, normotensive, afebrile, not tachycardic, not tachypneic, oxygenating well on room air.   Right hip pain - Plan: DG Hip Unilat With Pelvis 2-3 Views Right, DG Hip Unilat With Pelvis 2-3 Views Right Overall, vitals and exam are  reassuring. No red flags. Hip X-rays reviewed and are normal on my impression, formal radiology read pending. Suspect hip flexor strain.  Recommended PT, rest, heating pad, and NSAIDs. Can try muscle relaxer as well, RX provided. Referral to PT provided Has upcoming appt with Dr. Steward Drone her orthopedist and recommended she keep this. Patient's questions were answered and they are in agreement with this plan  Final Clinical Impressions(s) / UC Diagnoses   Final diagnoses:  Right hip pain     Discharge Instructions      You were seen today for hip pain. I suspect based on your exam this is a muscle strain of the hip flexors. X-rays looked good on my impression - I will contact you if the radiologist finds anything once they review it. I have referred you to physical therapy to rehab this. Recommend you keep your appt with Dr. Steward Drone on Monday. In the meantime I would avoid painful activities with the hip, use a heating pad as needed, take ibuprofen 600-800mg  8 hours apart.  You may try the muscle relaxer (Flexeril) as well, would start just at night as this can be a sedating medication.    ED Prescriptions     Medication Sig Dispense Auth. Provider   cyclobenzaprine (FLEXERIL) 10 MG tablet Take 1 tablet (10 mg total) by mouth 2 (two) times daily as needed for muscle spasms. 20 tablet Marisa Cyphers, MD      PDMP not reviewed this encounter.   Marisa Cyphers, MD 09/11/23 [phone]

## 2023-09-11 NOTE — ED Triage Notes (Signed)
 Pt presents to the office for right hip pain x 2 days. No injuries or trauma.

## 2023-09-16 ENCOUNTER — Ambulatory Visit (HOSPITAL_BASED_OUTPATIENT_CLINIC_OR_DEPARTMENT_OTHER): Admitting: Orthopaedic Surgery

## 2023-10-07 ENCOUNTER — Ambulatory Visit (INDEPENDENT_AMBULATORY_CARE_PROVIDER_SITE_OTHER): Payer: Managed Care, Other (non HMO) | Admitting: Family

## 2023-10-07 ENCOUNTER — Encounter (HOSPITAL_BASED_OUTPATIENT_CLINIC_OR_DEPARTMENT_OTHER): Payer: Self-pay | Admitting: Family

## 2023-10-07 VITALS — BP 112/70 | HR 67 | Ht <= 58 in | Wt 128.0 lb

## 2023-10-07 DIAGNOSIS — E559 Vitamin D deficiency, unspecified: Secondary | ICD-10-CM | POA: Diagnosis not present

## 2023-10-07 DIAGNOSIS — R0789 Other chest pain: Secondary | ICD-10-CM | POA: Diagnosis not present

## 2023-10-07 DIAGNOSIS — R002 Palpitations: Secondary | ICD-10-CM

## 2023-10-07 NOTE — Patient Instructions (Addendum)
 Medication Instructions:   Start Vitamin D  1,000 international units daily (okay if you can only find 2,000 international units)  May use Melatonin 5-10mg  over the counter 30 minutes prior to sleep  *If you need a refill on your cardiac medications before your next appointment, please call your pharmacy*  Follow-Up: At [facility], you and your health needs are our priority.  As part of our continuing mission to provide you with exceptional heart care, our providers are all part of one team.  This team includes your primary Cardiologist (physician) and Advanced Practice Providers or APPs (Physician Assistants and Nurse Practitioners) who all work together to provide you with the care you need, when you need it.  Your next appointment:   As needed with Dr. Theodis Fiscal or Clearnce Curia, NP   We recommend signing up for the patient portal called "MyChart".  Sign up information is provided on this After Visit Summary.  MyChart is used to connect with patients for Virtual Visits (Telemedicine).  Patients are able to view lab/test results, encounter notes, upcoming appointments, etc.  Non-urgent messages can be sent to your provider as well.   To learn more about what you can do with MyChart, go to [email or web address].   Other Instructions Your EKG today looked great! It showed normal sinus rhythm.  To prevent palpitations: Make sure you are adequately hydrated.  Avoid and/or limit caffeine  containing beverages like soda or tea. Exercise regularly.  Manage stress well. Some over the counter medications can cause palpitations such as Benadryl , AdvilPM, TylenolPM. Regular Advil  or Tylenol  do not cause palpitations.

## 2023-10-07 NOTE — Progress Notes (Signed)
 Cardiology Office Note:  .   Date:  10/07/2023  ID:  Maria Smith, DOB July 06, 1995, MRN [id number] PCP: [place], Betty G, MD  [facility] Providers Cardiologist:  Gaylyn Keas, MD Electrophysiologist:  Efraim Grange, MD    History of Present Illness: .   Maria Smith is a 28 y.o. female with hx of palpitations, PCOS.   Seen 2019 by Dr. Lavonne Prairie for palpitations with monitor NSR with rare PVC/PAC. Echo LVEF 60-65%, otherwise unremarkable. Seen 05/2022 by Dr. Micael Adas for palpitations, exertional dyspnea. Echo and monitor unchanged and unremarkable. NMR lipid profile and Lp(a) low risk. Seen EP 10/2022 with triggered events on monitor no underlying arrhythmias with no further cardiac evaluation recommended. At last visit 10/2022 her occasional palpitations were attributed to stress/anxiety.   Presents today for follow-up.  Notes she contacted the office regarding an episode of chest pain after initiating a more intensive workout routine including weightlifting.  No recurrent chest pain.  Prior chest pain likely musculoskeletal, reassurance provided.  Notes occasional palpitations.  Remains understandably concerned about her cardiovascular health due to family history.  She is exercising by walking on under desks treadmill while at work and also at the gym with a mix of cardio and weight training.  She and her wife have been working to eat more at home with their twin toddlers.  We discussed adding nutrition to meals through adding fiber sources, vegetables rather than having to prepare a separate meal for children.  She is concerned about low heart rate when resting or during the evening, reassurance provided that her heart rate into the 30s would be considered normal with sleep.  She reports poor sleep and encouraged to trial OTC melatonin.  Notes poor energy level and encouraged to add vitamin D  as recommended by her PCP in September, does not tolerate swelling pills well so could utilize  gummy.   ROS: Please see the history of present illness.    All other systems reviewed and are negative.   Studies Reviewed: Aaron Aas   EKG Interpretation Date/Time:  Monday October 07 2023 16:19:18 EDT Ventricular Rate:  63 PR Interval:  148 QRS Duration:  68 QT Interval:  382 QTC Calculation: 390 R Axis:   35  Text Interpretation: Normal sinus rhythm Normal ECG Confirmed by Neomi Banks ([id number]) on 10/07/2023 4:19:55 PM    Cardiac Studies & Procedures   ______________________________________________________________________________________________     ECHOCARDIOGRAM  ECHOCARDIOGRAM COMPLETE 06/12/2022  Narrative ECHOCARDIOGRAM REPORT    Patient Name:   Maria Smith Date of Exam: 06/12/2022 Medical Rec #:  [id number]        Height:       59.0 in Accession #:    [id number]       Weight:       112.9 lb Date of Birth:  February 24, 1996        BSA:          1.446 m Patient Age:    26 years         BP:           108/68 mmHg Patient Gender: F                HR:           64 bpm. Exam Location:  [address]  Procedure: 2D Echo, Cardiac Doppler and Color Doppler  Indications:    R06.02 SOB  History:        Patient has prior history of Echocardiogram  examinations, most recent 08/29/2017. Signs/Symptoms:Shortness of Breath.  Sonographer:    Lula Sale RDCS Referring Phys: [id number] TRACI R TURNER  IMPRESSIONS   1. Left ventricular ejection fraction, by estimation, is 55 to 60%. The left ventricle has normal function. The left ventricle has no regional wall motion abnormalities. Left ventricular diastolic parameters were normal. 2. Right ventricular systolic function is normal. The right ventricular size is normal. There is normal pulmonary artery systolic pressure. The estimated right ventricular systolic pressure is 21.1 mmHg. 3. The mitral valve is normal in structure. Trivial mitral valve regurgitation. No evidence of mitral stenosis. 4. The aortic valve is tricuspid. Aortic  valve regurgitation is not visualized. No aortic stenosis is present. 5. The inferior vena cava is normal in size with greater than 50% respiratory variability, suggesting right atrial pressure of 3 mmHg.  Comparison(s): No significant change from prior study.  FINDINGS Left Ventricle: Left ventricular ejection fraction, by estimation, is 55 to 60%. The left ventricle has normal function. The left ventricle has no regional wall motion abnormalities. The left ventricular internal cavity size was normal in size. There is no left ventricular hypertrophy. Left ventricular diastolic parameters were normal.  Right Ventricle: The right ventricular size is normal. No increase in right ventricular wall thickness. Right ventricular systolic function is normal. There is normal pulmonary artery systolic pressure. The tricuspid regurgitant velocity is 2.13 m/s, and with an assumed right atrial pressure of 3 mmHg, the estimated right ventricular systolic pressure is 21.1 mmHg.  Left Atrium: Left atrial size was normal in size.  Right Atrium: Right atrial size was normal in size.  Pericardium: There is no evidence of pericardial effusion.  Mitral Valve: The mitral valve is normal in structure. Trivial mitral valve regurgitation. No evidence of mitral valve stenosis.  Tricuspid Valve: The tricuspid valve is normal in structure. Tricuspid valve regurgitation is mild.  Aortic Valve: The aortic valve is tricuspid. Aortic valve regurgitation is not visualized. No aortic stenosis is present.  Pulmonic Valve: The pulmonic valve was normal in structure. Pulmonic valve regurgitation is mild.  Aorta: The aortic root and ascending aorta are structurally normal, with no evidence of dilitation.  Venous: The inferior vena cava is normal in size with greater than 50% respiratory variability, suggesting right atrial pressure of 3 mmHg.  IAS/Shunts: The atrial septum is grossly normal.   LEFT VENTRICLE PLAX  2D LVIDd:         4.20 cm   Diastology LVIDs:         2.50 cm   LV e' medial:    10.80 cm/s LV PW:         0.80 cm   LV E/e' medial:  8.5 LV IVS:        0.80 cm   LV e' lateral:   17.40 cm/s LVOT diam:     1.70 cm   LV E/e' lateral: 5.3 LV SV:         38 LV SV Index:   27 LVOT Area:     2.27 cm   RIGHT VENTRICLE             IVC RV S prime:     21.70 cm/s  IVC diam: 1.40 cm TAPSE (M-mode): 2.2 cm RVSP:           21.1 mmHg  LEFT ATRIUM             Index        RIGHT ATRIUM  Index LA diam:        2.60 cm 1.80 cm/m   RA Pressure: 3.00 mmHg LA Vol (A2C):   22.0 ml 15.21 ml/m  RA Area:     9.84 cm LA Vol (A4C):   20.2 ml 13.97 ml/m  RA Volume:   22.90 ml  15.83 ml/m LA Biplane Vol: 21.9 ml 15.14 ml/m AORTIC VALVE LVOT Vmax:   86.20 cm/s LVOT Vmean:  53.900 cm/s LVOT VTI:    0.169 m  AORTA Ao Root diam: 2.60 cm Ao Asc diam:  2.40 cm  MITRAL VALVE               TRICUSPID VALVE MV Area (PHT): 3.63 cm    TR Peak grad:   18.1 mmHg MV Decel Time: 209 msec    TR Vmax:        213.00 cm/s MV E velocity: 92.20 cm/s  Estimated RAP:  3.00 mmHg MV A velocity: 80.70 cm/s  RVSP:           21.1 mmHg MV E/A ratio:  1.14 SHUNTS Systemic VTI:  0.17 m Systemic Diam: 1.70 cm  Riccardo Chamberlain MD Electronically signed by Riccardo Chamberlain MD Signature Date/Time: 06/12/2022/10:39:31 AM    Final    MONITORS  Smith TERM MONITOR (3-14 DAYS) 06/12/2022  Narrative   Predominant rhythm was normal sinus rhythm with average heart rate 74bpm and ranged from 49 to 136bpm.   Rare PACs and PVCs.   Patch Wear Time:  3 days and 14 hours (2023-12-06T18:37:08-498 to 2023-12-10T08:43:41-0500)  Patient had a min HR of 49 bpm, max HR of 136 bpm, and avg HR of 74 bpm. Predominant underlying rhythm was Sinus Rhythm. Isolated SVEs were rare (<1.0%), and no SVE Couplets or SVE Triplets were present. Isolated VEs were rare (<1.0%), and no VE Couplets or VE Triplets were present.        ______________________________________________________________________________________________      Risk Assessment/Calculations:             Physical Exam:   VS:  BP 112/70 (BP Location: Left Arm, Patient Position: Sitting, Cuff Size: Normal)   Pulse 67   Ht 4\' 10"  (1.473 m)   Wt 128 lb (58.1 kg)   LMP 08/26/2023 (Approximate)   BMI 26.75 kg/m    Wt Readings from Last 3 Encounters:  10/07/23 128 lb (58.1 kg)  03/18/23 124 lb 6 oz (56.4 kg)  01/18/23 120 lb 2 oz (54.5 kg)    GEN: Well nourished, well developed in no acute distress NECK: No JVD; No carotid bruits CARDIAC: RRR, no murmurs, rubs, gallops RESPIRATORY:  Clear to auscultation without rales, wheezing or rhonchi  ABDOMEN: Soft, non-tender, non-distended EXTREMITIES:  No edema; No deformity   ASSESSMENT AND PLAN: .    Chest pain in adult - Atypical, more likely musculoskeletal pain. No indication for ischemic evalatuion. EKG today NSR with no acute ST/T wave changes.   Vitamin D  defieiciency - Start OTC Vitamin D  1,000 international units as previously recommended by PCP.   Palpitations- Prior monitor rare PAC/PVC. Occasional palpitations most often associated with stress. Encouraged reduced caffeine , continued hydration. No indication for medical therapy.   Family history of cardiovascular disease - Prior NMR and Lp(a) unremarkable. Continue primary prevention. Recommend aiming for 150 minutes of moderate intensity activity per week and following a heart healthy diet.         Dispo: follow up as needed with cardiology  Signed, Clearnce Curia, NP

## 2023-10-15 NOTE — Progress Notes (Signed)
 HPI: Maria Smith is a 28 y.o. female with a PMHx significant for migraine, PVC, PAC, asthma, GERD, PCOS, vitamin D  deficiency, and insomnia, among some, who is here today for her routine physical.  Last CPE: not on file  Exercise: Diet:  Sleep:  Alcohol Use:  Smoking: Vision:  Dental:  Immunization History  Administered Date(s) Administered   Influenza, Seasonal, Injecte, Preservative Fre 02/20/2023   Influenza,inj,Quad PF,6+ Mos 05/04/2016, 04/12/2021   PFIZER(Purple Top)SARS-COV-2 Vaccination 01/29/2020, 02/19/2020, 09/16/2020   Tetanus 06/18/2013   Health Maintenance  Topic Date Due   DTaP/Tdap/Td (2 - Tdap) 06/19/2013   Pneumococcal Vaccine 66-41 Years old (1 of 2 - PCV) Never done   Cervical Cancer Screening (Pap smear)  10/07/2022   COVID-19 Vaccine (4 - 2024-25 season) 02/17/2023   INFLUENZA VACCINE  01/17/2024   Hepatitis C Screening  Completed   HIV Screening  Completed   HPV VACCINES  Aged Out   Meningococcal B Vaccine  Aged Out   Chronic medical problems: ***  Concerns today: ***  Review of Systems  Current Outpatient Medications on File Prior to Visit  Medication Sig Dispense Refill   ibuprofen  (ADVIL ) 600 MG tablet To take 1 tablet tid with food 2-3 days before menses for 5 days. 30 tablet 1   No current facility-administered medications on file prior to visit.   Past Medical History:  Diagnosis Date   Allergic rhinitis    Allergy     SEASONAL   Asthma    Complication of anesthesia    Low BP   Eczema    Environmental allergies    Age 41 years   Family history of adverse reaction to anesthesia    Mother went into coma after anesthesia. 4 days to wake up   Fatty liver 12/11/2018   Noted on CT 6/20   Generalized headaches    Migraines. 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   PVC (premature ventricular contraction) 11/09/2022   Streptococcal infection(041.00)    Age 37 and 28 years old   TB lung, latent    2017   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/2018   Diffuse enlargement on US  6/20   Past Surgical History:  Procedure Laterality Date   ESOPHAGOGASTRODUODENOSCOPY ENDOSCOPY  2021   HIP ARTHROSCOPY Left 03/20/2022   Procedure: LEFT HIP ARTHROSCOPY WITH LABRAL REPAIR;  Surgeon: Wilhelmenia Harada, MD;  Location: [facility] OR;  Service: Orthopedics;  Laterality: Left;   TONSILLECTOMY  06/18/2010   WISDOM TOOTH EXTRACTION     No Known Allergies  Family History  Problem Relation Age of Onset   Migraines Paternal Grandmother    Migraines Paternal Grandfather    HIV Father    Diabetes Mother    Hypertension Mother    Anemia Mother    COPD Mother    CVA Mother    Congestive Heart Failure Mother    Cirrhosis Mother    Obesity Mother    Irritable bowel syndrome Mother    Rheum arthritis Mother    Ulcers Brother    Anemia Sister    Cholecystitis Brother    Migraines Paternal Aunt    Migraines Maternal Uncle        Maternal Great Uncle   Diabetes Other        Family  History   Hypertension Other        Family History   Depression Other        Family History   Asthma Other        Family History   Allergic rhinitis Other        Family History   Asthma Brother    Colon cancer Neg Hx    Colon polyps Neg Hx    Esophageal cancer Neg Hx    Rectal cancer Neg Hx    Stomach cancer Neg Hx    Social History   Socioeconomic History   Marital status: Married    Spouse name: Not on file   Number of children: 2   Years of education: Not on file   Highest education level: Associate degree: occupational, Scientist, product/process development, or vocational program  Occupational History   Occupation: dietary  Tobacco Use   Smoking status: Never   Smokeless tobacco: Never  Vaping Use   Vaping status: Never Used  Substance and Sexual Activity   Alcohol use: Not Currently    Drug use: No   Sexual activity: Yes    Partners: Female    Birth control/protection: None    Comment: female partner  Other Topics Concern   Not on file  Social History Narrative   Patient works at [employer], [employer]   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    Social Drivers of Corporate investment banker Strain: Low Risk  (01/18/2023)   Overall Financial Resource Strain (CARDIA)    Difficulty of Paying Living Expenses: Not hard at all  Food Insecurity: No Food Insecurity (01/18/2023)   Hunger Vital Sign    Worried About Running Out of Food in the Last Year: Never true    Ran Out of Food in the Last Year: Never true  Transportation Needs: No Transportation Needs (01/18/2023)   PRAPARE - Administrator, Civil Service (Medical): No    Lack of Transportation (Non-Medical): No  Physical Activity: Insufficiently Active (01/18/2023)   Exercise Vital Sign    Days of Exercise per Week: 3 days    Minutes of Exercise per Session: 30 min  Stress: No Stress Concern Present (01/18/2023)   [employer] of Occupational Health - Occupational Stress Questionnaire    Feeling of Stress : Not at all  Social Connections: Moderately Isolated (01/18/2023)   Social Connection and Isolation Panel [NHANES]    Frequency of Communication with Friends and Family: Three times a week    Frequency of Social Gatherings with Friends and Family: Twice a week    Attends Religious Services: Never    Database administrator or Organizations: No    Attends Engineer, structural: Not on file    Marital Status: Married   There were no vitals filed for this visit. There is no height or weight on file to calculate BMI.  Wt Readings from Last 3 Encounters:  10/07/23 128 lb (58.1 kg)  03/18/23 124 lb 6 oz (56.4 kg)  01/18/23 120 lb 2 oz (54.5 kg)   Physical Exam  ASSESSMENT AND PLAN:  [name]  was here today for her annual physical examination.  No orders of the defined types were placed in this encounter.   @ASSESSPLAN @  There are no diagnoses linked to this encounter.  No follow-ups on file.  I,  Jackson J Wierda, acting as a Neurosurgeon for Airam Runions [place], MD., have documented all relevant documentation on the behalf of Gianny Killman [place], MD, as directed by  Avelino Herren [place], MD while in the presence of Taquana Bartley [place], MD.   I, Anacarolina Evelyn [place], MD, have reviewed all documentation for this visit. The documentation on 10/15/23 for the exam, diagnosis, procedures, and orders are all accurate and complete.  Afnan Emberton G. [place], MD  [facility] [place]. [place] [facility].

## 2023-10-16 ENCOUNTER — Ambulatory Visit (INDEPENDENT_AMBULATORY_CARE_PROVIDER_SITE_OTHER): Admitting: Family Medicine

## 2023-10-16 ENCOUNTER — Encounter: Payer: Self-pay | Admitting: Family Medicine

## 2023-10-16 ENCOUNTER — Other Ambulatory Visit (HOSPITAL_COMMUNITY)
Admission: RE | Admit: 2023-10-16 | Discharge: 2023-10-16 | Disposition: A | Discharge status: Home / Self Care | Source: Ambulatory Visit | Attending: Family Medicine | Admitting: Family Medicine

## 2023-10-16 VITALS — BP 100/70 | HR 69 | Temp 98.4°F | Resp 12 | Ht <= 58 in | Wt 129.0 lb

## 2023-10-16 DIAGNOSIS — Z13220 Encounter for screening for lipoid disorders: Secondary | ICD-10-CM

## 2023-10-16 DIAGNOSIS — M545 Low back pain, unspecified: Secondary | ICD-10-CM | POA: Insufficient documentation

## 2023-10-16 DIAGNOSIS — E559 Vitamin D deficiency, unspecified: Secondary | ICD-10-CM | POA: Diagnosis not present

## 2023-10-16 DIAGNOSIS — Z1329 Encounter for screening for other suspected endocrine disorder: Secondary | ICD-10-CM | POA: Diagnosis not present

## 2023-10-16 DIAGNOSIS — Z130 Encounter for screening for diseases of the blood and blood-forming organs and certain disorders involving the immune mechanism: Secondary | ICD-10-CM | POA: Diagnosis not present

## 2023-10-16 DIAGNOSIS — Z0000 Encounter for general adult medical examination without abnormal findings: Secondary | ICD-10-CM | POA: Diagnosis not present

## 2023-10-16 DIAGNOSIS — Z113 Encounter for screening for infections with a predominantly sexual mode of transmission: Secondary | ICD-10-CM | POA: Insufficient documentation

## 2023-10-16 DIAGNOSIS — Z13228 Encounter for screening for other metabolic disorders: Secondary | ICD-10-CM

## 2023-10-16 DIAGNOSIS — G8929 Other chronic pain: Secondary | ICD-10-CM | POA: Diagnosis not present

## 2023-10-16 LAB — LIPID PANEL
Cholesterol: 185 mg/dL (ref 0–200)
HDL: 82.4 mg/dL (ref >39.00)
LDL Cholesterol: 94 mg/dL (ref 0–99)
NonHDL: 102.3
Total CHOL/HDL Ratio: 2
Triglycerides: 43 mg/dL (ref 0.0–149.0)
VLDL: 8.6 mg/dL (ref 0.0–40.0)

## 2023-10-16 LAB — COMPREHENSIVE METABOLIC PANEL WITH GFR
ALT: 11 U/L (ref 0–35)
AST: 20 U/L (ref 0–37)
Albumin: 4.5 g/dL (ref 3.5–5.2)
Alkaline Phosphatase: 48 U/L (ref 39–117)
BUN: 8 mg/dL (ref 6–23)
CO2: 26 meq/L (ref 19–32)
Calcium: 9.7 mg/dL (ref 8.4–10.5)
Chloride: 101 meq/L (ref 96–112)
Creatinine, Ser: 0.9 mg/dL (ref 0.40–1.20)
GFR: 87.19 mL/min (ref >60.00)
Glucose, Bld: 85 mg/dL (ref 70–99)
Potassium: 3.7 meq/L (ref 3.5–5.1)
Sodium: 135 meq/L (ref 135–145)
Total Bilirubin: 0.7 mg/dL (ref 0.2–1.2)
Total Protein: 7.5 g/dL (ref 6.0–8.3)

## 2023-10-16 LAB — URINALYSIS, ROUTINE W REFLEX MICROSCOPIC
Bilirubin Urine: NEGATIVE
Hgb urine dipstick: NEGATIVE
Ketones, ur: NEGATIVE
Leukocytes,Ua: NEGATIVE
Nitrite: NEGATIVE
RBC / HPF: NONE SEEN (ref 0–?)
Specific Gravity, Urine: <=1.005 — AB (ref 1.000–1.030)
Total Protein, Urine: NEGATIVE
Urine Glucose: NEGATIVE
Urobilinogen, UA: 0.2 (ref 0.0–1.0)
WBC, UA: NONE SEEN (ref 0–?)
pH: 7 (ref 5.0–8.0)

## 2023-10-16 LAB — VITAMIN D 25 HYDROXY (VIT D DEFICIENCY, FRACTURES): VITD: 25.12 ng/mL — ABNORMAL LOW (ref 30.00–100.00)

## 2023-10-16 NOTE — Assessment & Plan Note (Signed)
 We discussed the importance of regular physical activity and healthy diet for prevention of chronic illness and/or complications. Preventive guidelines reviewed. Vaccination: Same as she is due for Tdap, will bring her back for nurse visit. Continue her female preventive care with gynecologist. Next CPE in a year.

## 2023-10-16 NOTE — Assessment & Plan Note (Signed)
 Continue OTC vitamin D  5000 units. Further recommendation will be given according to 25 OH vitamin D  result.

## 2023-10-16 NOTE — Assessment & Plan Note (Signed)
 We discussed possible etiologies. History and examination do not suggest a serious process, it seems to be musculoskeletal. Monitor for new symptoms. Further recommendation will be given according to lab results. I do not think imaging is needed at this time.

## 2023-10-16 NOTE — Patient Instructions (Addendum)
 A few things to remember from today's visit:  Routine general medical examination at a health care facility  Screen for STD (sexually transmitted disease) - Plan: Urine cytology ancillary only, RPR, HIV Antibody (routine testing w rflx)  Screening for lipoid disorders - Plan: Lipid panel  Screening for endocrine, metabolic and immunity disorder - Plan: Comprehensive metabolic panel with GFR  Vitamin D  insufficiency - Plan: VITAMIN D  25 Hydroxy (Vit-D Deficiency, Fractures)  Chronic right-sided low back pain without sciatica - Plan: Urinalysis, Routine w reflex microscopic  Back pain seems more musculoskeletal. Monitor for changes. Topical icy hot may help.  Do not use My Chart to request refills or for acute issues that need immediate attention. If you send a my chart message, it may take a few days to be addressed, specially if I am not in the office.  Please be sure medication list is accurate. If a new problem present, please set up appointment sooner than planned today.  Health Maintenance, Female Adopting a healthy lifestyle and getting preventive care are important in promoting health and wellness. Ask your health care provider about: The right schedule for you to have regular tests and exams. Things you can do on your own to prevent diseases and keep yourself healthy. What should I know about diet, weight, and exercise? Eat a healthy diet  Eat a diet that includes plenty of vegetables, fruits, low-fat dairy products, and lean protein. Do not eat a lot of foods that are high in solid fats, added sugars, or sodium. Maintain a healthy weight Body mass index (BMI) is used to identify weight problems. It estimates body fat based on height and weight. Your health care provider can help determine your BMI and help you achieve or maintain a healthy weight. Get regular exercise Get regular exercise. This is one of the most important things you can do for your health. Most adults  should: Exercise for at least 150 minutes each week. The exercise should increase your heart rate and make you sweat (moderate-intensity exercise). Do strengthening exercises at least twice a week. This is in addition to the moderate-intensity exercise. Spend less time sitting. Even light physical activity can be beneficial. Watch cholesterol and blood lipids Have your blood tested for lipids and cholesterol at 28 years of age, then have this test every 5 years. Have your cholesterol levels checked more often if: Your lipid or cholesterol levels are high. You are older than 28 years of age. You are at high risk for heart disease. What should I know about cancer screening? Depending on your health history and family history, you may need to have cancer screening at various ages. This may include screening for: Breast cancer. Cervical cancer. Colorectal cancer. Skin cancer. Lung cancer. What should I know about heart disease, diabetes, and high blood pressure? Blood pressure and heart disease High blood pressure causes heart disease and increases the risk of stroke. This is more likely to develop in people who have high blood pressure readings or are overweight. Have your blood pressure checked: Every 3-5 years if you are 98-20 years of age. Every year if you are 21 years old or older. Diabetes Have regular diabetes screenings. This checks your fasting blood sugar level. Have the screening done: Once every three years after age 83 if you are at a normal weight and have a low risk for diabetes. More often and at a younger age if you are overweight or have a high risk for diabetes. What should I  know about preventing infection? Hepatitis B If you have a higher risk for hepatitis B, you should be screened for this virus. Talk with your health care provider to find out if you are at risk for hepatitis B infection. Hepatitis C Testing is recommended for: Everyone born from [birth year] through  1965. Anyone with known risk factors for hepatitis C. Sexually transmitted infections (STIs) Get screened for STIs, including gonorrhea and chlamydia, if: You are sexually active and are younger than 28 years of age. You are older than 27 years of age and your health care provider tells you that you are at risk for this type of infection. Your sexual activity has changed since you were last screened, and you are at increased risk for chlamydia or gonorrhea. Ask your health care provider if you are at risk. Ask your health care provider about whether you are at high risk for HIV. Your health care provider may recommend a prescription medicine to help prevent HIV infection. If you choose to take medicine to prevent HIV, you should first get tested for HIV. You should then be tested every 3 months for as long as you are taking the medicine. Pregnancy If you are about to stop having your period (premenopausal) and you may become pregnant, seek counseling before you get pregnant. Take 400 to 800 micrograms (mcg) of folic acid every day if you become pregnant. Ask for birth control (contraception) if you want to prevent pregnancy. Osteoporosis and menopause Osteoporosis is a disease in which the bones lose minerals and strength with aging. This can result in bone fractures. If you are 55 years old or older, or if you are at risk for osteoporosis and fractures, ask your health care provider if you should: Be screened for bone loss. Take a calcium or vitamin D  supplement to lower your risk of fractures. Be given hormone replacement therapy (HRT) to treat symptoms of menopause. Follow these instructions at home: Alcohol use Do not drink alcohol if: Your health care provider tells you not to drink. You are pregnant, may be pregnant, or are planning to become pregnant. If you drink alcohol: Limit how much you have to: 0-1 drink a day. Know how much alcohol is in your drink. In the [place] one drink  equals one 12 oz bottle of beer (355 mL), one 5 oz glass of wine (148 mL), or one 1 oz glass of hard liquor (44 mL). Lifestyle Do not use any products that contain nicotine or tobacco. These products include cigarettes, chewing tobacco, and vaping devices, such as e-cigarettes. If you need help quitting, ask your health care provider. Do not use street drugs. Do not share needles. Ask your health care provider for help if you need support or information about quitting drugs. General instructions Schedule regular health, dental, and eye exams. Stay current with your vaccines. Tell your health care provider if: You often feel depressed. You have ever been abused or do not feel safe at home. Summary Adopting a healthy lifestyle and getting preventive care are important in promoting health and wellness. Follow your health care provider's instructions about healthy diet, exercising, and getting tested or screened for diseases. Follow your health care provider's instructions on monitoring your cholesterol and blood pressure. This information is not intended to replace advice given to you by your health care provider. Make sure you discuss any questions you have with your health care provider. Document Revised: 10/24/2020 Document Reviewed: 10/24/2020 Elsevier Patient Education  2024 [employer].

## 2023-10-17 LAB — URINE CYTOLOGY ANCILLARY ONLY
Chlamydia: NEGATIVE
Comment: NEGATIVE
Comment: NEGATIVE
Comment: NORMAL
Neisseria Gonorrhea: NEGATIVE
Trichomonas: NEGATIVE

## 2023-10-17 LAB — RPR: RPR Ser Ql: NONREACTIVE

## 2023-10-17 LAB — HIV ANTIBODY (ROUTINE TESTING W REFLEX): HIV 1&2 Ab, 4th Generation: NONREACTIVE

## 2023-10-30 ENCOUNTER — Ambulatory Visit (INDEPENDENT_AMBULATORY_CARE_PROVIDER_SITE_OTHER): Admitting: Family Medicine

## 2023-10-30 VITALS — BP 106/70 | HR 66 | Temp 98.3°F | Resp 12 | Ht <= 58 in | Wt 128.2 lb

## 2023-10-30 DIAGNOSIS — R21 Rash and other nonspecific skin eruption: Secondary | ICD-10-CM | POA: Diagnosis not present

## 2023-10-30 DIAGNOSIS — Z23 Encounter for immunization: Secondary | ICD-10-CM

## 2023-10-30 DIAGNOSIS — L700 Acne vulgaris: Secondary | ICD-10-CM | POA: Diagnosis not present

## 2023-10-30 MED ORDER — CLINDAMYCIN PHOS-BENZOYL PEROX 1-5 % EX GEL: Freq: Two times a day (BID) | CUTANEOUS | 2 refills | Status: DC

## 2023-10-30 NOTE — Patient Instructions (Addendum)
 A few things to remember from today's visit:  Need for diphtheria-tetanus-pertussis (Tdap) vaccine - Plan: Tdap vaccine greater than or equal to 28yo IM  Papular rash, localized - Plan: Ambulatory referral to Dermatology  Acne vulgaris - Plan: clindamycin -benzoyl peroxide (BENZACLIN) gel  Continue topical Benzaclin. Monitor for changes.  If you need refills for medications you take chronically, please call your pharmacy. Do not use My Chart to request refills or for acute issues that need immediate attention. If you send a my chart message, it may take a few days to be addressed, specially if I am not in the office.  Please be sure medication list is accurate. If a new problem present, please set up appointment sooner than planned today.

## 2023-10-30 NOTE — Progress Notes (Signed)
 HPI: [name] is a 28 y.o. female with a PMHx significant for migraine, PVC, PAC, asthma, GERD, PCOS, vitamin D  deficiency, and insomnia, among some, who is here today for chronic disease management.  Last seen on 10/16/2023  Patient states she needs an updated referral for dermatology.  She has been having intermittent rash on her face, shoulder, back, and arms, similar to what she has had on her face and chest in the past.  The rash scars when it heals, and hurts occasionally.  The clindamycin -benzyl cream she was given has helped when she uses it.  Missed appt with dermatologist. She is concerned about herpes. Perioral lesions start like small papular non erythematous, gradually grows and become erythematous and pustular in weeks. Tender with palpation. She has not identified exacerbation factors. She usually doe snot try to squeeze lesions.  Review of Systems  Constitutional:  Negative for activity change, appetite change and fever.  HENT:  Negative for mouth sores and sore throat.   Eyes:  Negative for discharge and redness.  Respiratory:  Negative for cough and shortness of breath.   Gastrointestinal:  Negative for abdominal pain and nausea.  Musculoskeletal:  Negative for joint swelling and myalgias.  Allergic/Immunologic: Positive for environmental allergies.  Neurological:  Negative for numbness and headaches.  See other pertinent positives and negatives in HPI.  Current Outpatient Medications on File Prior to Visit  Medication Sig Dispense Refill   ibuprofen  (ADVIL ) 600 MG tablet To take 1 tablet tid with food 2-3 days before menses for 5 days. 30 tablet 1   No current facility-administered medications on file prior to visit.   Past Medical History:  Diagnosis Date   Allergic rhinitis    Allergy     SEASONAL   Asthma    Complication of anesthesia    Low BP   Eczema    Environmental allergies    Age 74 years   Family history of adverse reaction to  anesthesia    Mother went into coma after anesthesia. 4 days to wake up   Fatty liver 12/11/2018   Noted on CT 6/20   Generalized headaches    Migraines. 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 Rifampin since 08/2015   PVC (premature ventricular contraction) 11/09/2022   Streptococcal infection(041.00)    Age 43 and 28 years old   TB lung, latent    2017   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/2018   Diffuse enlargement on US  6/20   No Known Allergies  Social History   Socioeconomic History   Marital status: Married    Spouse name: Not on file   Number of children: 2   Years of education: Not on file   Highest education level: Associate degree: academic program  Occupational History   Occupation: dietary  Tobacco Use   Smoking status: Never   Smokeless tobacco: Never  Vaping Use   Vaping status: Never Used  Substance and Sexual Activity   Alcohol use: Not Currently   Drug use: No   Sexual activity: Yes    Partners: Female    Birth control/protection: None    Comment: female partner  Other Topics Concern   Not on file  Social History Narrative   Patient works at [employer], [employer]   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    Social Drivers of Corporate investment banker Strain: Low Risk  (10/30/2023)   Overall Financial Resource Strain (CARDIA)    Difficulty of Paying Living Expenses: Not hard at all  Food Insecurity: No Food Insecurity (10/30/2023)   Hunger Vital Sign    Worried About Running Out of Food in the Last Year: Never true    Ran Out of Food in the Last Year: Never true  Transportation Needs: No Transportation Needs (10/30/2023)   PRAPARE - Administrator, Civil Service (Medical): No    Lack of Transportation  (Non-Medical): No  Physical Activity: Sufficiently Active (10/30/2023)   Exercise Vital Sign    Days of Exercise per Week: 3 days    Minutes of Exercise per Session: 60 min  Stress: No Stress Concern Present (10/30/2023)   [employer] of Occupational Health - Occupational Stress Questionnaire    Feeling of Stress : Not at all  Social Connections: Unknown (10/30/2023)   Social Connection and Isolation Panel [NHANES]    Frequency of Communication with Friends and Family: Three times a week    Frequency of Social Gatherings with Friends and Family: Patient declined    Attends Religious Services: Patient declined    Active Member of Clubs or Organizations: No    Attends Engineer, structural: Not on file    Marital Status: Married    Vitals:   10/30/23 0732  BP: 106/70  Pulse: 66  Resp: 12  Temp: 98.3 F (36.8 C)  SpO2: 98%   Body mass index is 26.79 kg/m.  Physical Exam Vitals and nursing note reviewed.  Constitutional:      General: She is not in acute distress.    Appearance: She is well-developed.  HENT:     Head: Normocephalic and atraumatic.     Mouth/Throat:     Mouth: Mucous membranes are moist.     Pharynx: Oropharynx is clear.  Eyes:     Conjunctiva/sclera: Conjunctivae normal.  Cardiovascular:     Rate and Rhythm: Normal rate and regular rhythm.     Heart sounds: No murmur heard. Pulmonary:     Effort: Pulmonary effort is normal. No respiratory distress.     Breath sounds: Normal breath sounds.  Lymphadenopathy:     Cervical: No cervical adenopathy.  Skin:    General: Skin is warm.     Findings: Rash present. No erythema. Rash is papular and pustular.     Comments: Papular rash right shoulder and chest, about 1-2 mm, erythematous, not tender.  Micropapular non erythematous lesions on chin, different sizes and a pustular lesion on the right side under lip. See pictures. Scattered hyperpigmented changes where lesions were in the past.   Neurological:     General: No focal deficit present.     Mental Status: She is alert and oriented to person, place, and time.     Gait: Gait normal.  Psychiatric:        Mood and Affect: Mood and affect normal.       ASSESSMENT AND PLAN:  [name] was seen today for chronic disease management.   Orders Placed This Encounter  Procedures   Tdap vaccine greater than or equal to 7yo IM   Ambulatory referral to Dermatology   Acne vulgaris Lesions on upper chest and perioral seem more like acnes, we discussed  differential Dx. Hx and examination do not suggest HSV. We do not have available culture kit to screen. Combination of clindamycin  and benzoyl peroxide has helped in the past, so rx sent.  -     Clindamycin  Phos-Benzoyl Perox; Apply topically 2 (two) times daily.  Dispense: 35 g; Refill: 2  Papular rash, localized Scattered on back and shoulders, leaving post inflammatory pigmentation changes. We discussed possible causes. Dermatology referral placed.  -     Ambulatory referral to Dermatology  Need for diphtheria-tetanus-pertussis (Tdap) vaccine -     Tdap vaccine greater than or equal to 7yo IM  Return if symptoms worsen or fail to improve, for keep next appointment.  I, Fritz Jewel Wierda, acting as a scribe for Rynlee Lisbon [place], MD., have documented all relevant documentation on the behalf of Jossie Smoot [place], MD, as directed by  Deandria Klute [place], MD while in the presence of Vernon Ariel [place], MD.   I, Jarel Cuadra [place], MD, have reviewed all documentation for this visit. The documentation on 10/30/23 for the exam, diagnosis, procedures, and orders are all accurate and complete.  Valorie Mcgrory G. [place], MD  [facility]. [place] office.

## 2023-12-23 ENCOUNTER — Other Ambulatory Visit: Payer: Self-pay

## 2023-12-23 ENCOUNTER — Encounter (HOSPITAL_BASED_OUTPATIENT_CLINIC_OR_DEPARTMENT_OTHER): Payer: Self-pay | Admitting: *Deleted

## 2023-12-23 ENCOUNTER — Emergency Department (HOSPITAL_BASED_OUTPATIENT_CLINIC_OR_DEPARTMENT_OTHER)
Admission: EM | Admit: 2023-12-23 | Discharge: 2023-12-23 | Disposition: A | Discharge status: Home / Self Care | Attending: Emergency Medicine | Admitting: Emergency Medicine

## 2023-12-23 ENCOUNTER — Emergency Department (HOSPITAL_BASED_OUTPATIENT_CLINIC_OR_DEPARTMENT_OTHER)

## 2023-12-23 DIAGNOSIS — M79662 Pain in left lower leg: Secondary | ICD-10-CM | POA: Diagnosis not present

## 2023-12-23 DIAGNOSIS — R002 Palpitations: Secondary | ICD-10-CM | POA: Diagnosis present

## 2023-12-23 HISTORY — DX: Follicular disorder, unspecified: L73.9

## 2023-12-23 LAB — BASIC METABOLIC PANEL WITH GFR
Anion gap: 8 (ref 5–15)
BUN: 12 mg/dL (ref 6–20)
CO2: 24 mmol/L (ref 22–32)
Calcium: 9.5 mg/dL (ref 8.9–10.3)
Chloride: 103 mmol/L (ref 98–111)
Creatinine, Ser: 1 mg/dL (ref 0.44–1.00)
GFR, Estimated: >60 mL/min (ref >60)
Glucose, Bld: 89 mg/dL (ref 70–99)
Potassium: 4.3 mmol/L (ref 3.5–5.1)
Sodium: 135 mmol/L (ref 135–145)

## 2023-12-23 LAB — CBC
HCT: 36.4 % (ref 36.0–46.0)
Hemoglobin: 12.2 g/dL (ref 12.0–15.0)
MCH: 28.4 pg (ref 26.0–34.0)
MCHC: 33.5 g/dL (ref 30.0–36.0)
MCV: 84.8 fL (ref 80.0–100.0)
Platelets: 298 K/uL (ref 150–400)
RBC: 4.29 MIL/uL (ref 3.87–5.11)
RDW: 12.7 % (ref 11.5–15.5)
WBC: 6.5 K/uL (ref 4.0–10.5)
nRBC: 0 % (ref 0.0–0.2)

## 2023-12-23 NOTE — ED Provider Notes (Addendum)
 [place] EMERGENCY DEPARTMENT AT [facility] Provider Note   CSN: [id number] Arrival date & time: 12/23/23  0250     Patient presents with: Palpitations   Maria Smith is a 28 y.o. female.   Patient is a 28 year old female presenting with complaints of palpitations and left calf pain.  She has a history of palpitations and has been worked up in the past for this, but no definitive cause was found.  She noticed her heart skipping beats this evening.  She is also describing discomfort to the back of her left calf that has been bothering her for the past 2 days.  This began in the absence of any specific injury or trauma.  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       Prior to Admission medications   Not on File    Allergies: Patient has no known allergies.    Review of Systems  All other systems reviewed and are negative.   Updated Vital Signs Ht 4' 10 (1.473 m)   Wt 59 kg   LMP 11/27/2023   BMI 27.17 kg/m   Physical Exam Vitals and nursing note reviewed.  Constitutional:      General: She is not in acute distress.    Appearance: She is well-developed. She is not diaphoretic.  HENT:     Head: Normocephalic and atraumatic.  Cardiovascular:     Rate and Rhythm: Normal rate and regular rhythm.     Heart sounds: No murmur heard.    No friction rub. No gallop.  Pulmonary:     Effort: Pulmonary effort is normal. No respiratory distress.     Breath sounds: Normal breath sounds. No wheezing.  Abdominal:     General: Bowel sounds are normal. There is no distension.     Palpations: Abdomen is soft.     Tenderness: There is no abdominal tenderness.  Musculoskeletal:        General: Normal range of motion.     Cervical back: Normal range of motion and neck supple.     Comments: The left calf is grossly normal in appearance.  There is some tenderness to the posterior aspect, but no  palpable abnormality.  DP pulses are palpable and motor and sensation are intact throughout the entire foot.  Toula' sign is absent.  Skin:    General: Skin is warm and dry.  Neurological:     General: No focal deficit present.     Mental Status: She is alert and oriented to person, place, and time.     (all labs ordered are listed, but only abnormal results are displayed) Labs Reviewed  BASIC METABOLIC PANEL WITH GFR  CBC  PREGNANCY, URINE    EKG: EKG Interpretation Date/Time:  Monday December 23 2023 03:03:46 EDT Ventricular Rate:  68 PR Interval:  155 QRS Duration:  77 QT Interval:  388 QTC Calculation: 413 R Axis:   50  Text Interpretation: Sinus rhythm Normal ECG No significant change since 10/07/2023 Confirmed by Geroldine Berg ([id number]) on 12/23/2023 3:06:53 AM  Radiology: No results found.   Procedures   Medications Ordered in the ED - No data to display                                  Medical Decision Making Amount and/or Complexity of Data Reviewed Labs:  ordered.   Patient presenting here with left calf pain and palpitations as described in the HPI.  Patient arrives here with stable vital signs and is afebrile.  Physical examination reveals tenderness to the left calf, but is otherwise unremarkable.  Basic metabolic panel and CBC basically unremarkable.  Patient has been observed and has had no ectopy.  She has maintained a sinus rhythm throughout her ED course.  Cause of her calf pain is unknown, but will set up an ultrasound for the morning to rule out DVT.  As far as her palpitations go, she has been worked up for this in the past and nothing appears emergent this evening.   As it turns out, US  came in at 4:30AM and the study was completed.  No evidence for DVT.  Will be discharged as a calf strain.  Final diagnoses:  None    ED Discharge Orders     None          Geroldine Berg, MD 12/23/23 LUCIE    Geroldine Berg, MD 12/23/23 [phone]

## 2023-12-23 NOTE — Discharge Instructions (Addendum)
 Begin taking ibuprofen  600 mg every 8 hours as needed for pain.  Rest.   Avoid caffeine  and alcohol as this may worsen palpitations.  Follow-up with primary doctor/cardiologist if symptoms persist.

## 2023-12-23 NOTE — ED Triage Notes (Addendum)
 C/o heart palpations for the past 2 days. Worse at night. Hx of same with a cardiac workup. Also c/o left lower calf cramping for the past couple weeks. Denies any long car rides but states she sits a lot for her job. Denies any sob. C/o Headache but states she has been working a lot and not sleeping much.

## 2024-01-23 ENCOUNTER — Encounter: Payer: Self-pay | Admitting: Adult Health

## 2024-01-23 ENCOUNTER — Ambulatory Visit (INDEPENDENT_AMBULATORY_CARE_PROVIDER_SITE_OTHER): Admitting: Adult Health

## 2024-01-23 VITALS — BP 120/80 | HR 69 | Temp 98.1°F | Ht <= 58 in | Wt 131.0 lb

## 2024-01-23 DIAGNOSIS — R300 Dysuria: Secondary | ICD-10-CM

## 2024-01-23 LAB — POCT URINALYSIS DIPSTICK
Bilirubin, UA: NEGATIVE
Blood, UA: NEGATIVE
Glucose, UA: NEGATIVE
Ketones, UA: NEGATIVE
Leukocytes, UA: NEGATIVE
Nitrite, UA: NEGATIVE
Protein, UA: NEGATIVE
Spec Grav, UA: 1.015 (ref 1.010–1.025)
Urobilinogen, UA: 0.2 U/dL
pH, UA: 6 (ref 5.0–8.0)

## 2024-01-23 MED ORDER — PHENAZOPYRIDINE HCL 100 MG PO TABS: 100.0000 mg | ORAL_TABLET | Freq: Three times a day (TID) | ORAL | 0 refills | Status: DC | PRN

## 2024-01-23 NOTE — Progress Notes (Signed)
 Subjective:    Patient ID: Maria Smith, female    DOB: 1995-09-08, 28 y.o.   MRN: [id number]  Dysuria     28 year old female who  has a past medical history of Allergic rhinitis, Allergy , Asthma, Complication of anesthesia, Eczema, Environmental allergies, Family history of adverse reaction to anesthesia, Fatty liver (12/11/2018), Folliculitis, Generalized headaches, GERD (gastroesophageal reflux disease), Influenza, Mitral valve regurgitation, PAC (premature atrial contraction) (11/09/2022), Positive PPD (07/2015), PVC (premature ventricular contraction) (11/09/2022), Streptococcal infection(041.00), TB lung, latent, and Thyromegaly (12/11/2018).  Discussed the use of AI scribe software for clinical note transcription with the patient, who gave verbal consent to proceed.  History of Present Illness   Maria Smith is a 28 year old female who presents for an acute visit.   She experiences a burning sensation during urination for three days. There is pressure while urinating and difficulty voiding a large amount at once. She has not changed soaps, detergents, or lotions recently. . There is no unusual vaginal discharge, only clear discharge typical before her menstrual cycle. She has no concerns for sexually transmitted diseases. She has not noticed any rashes, or experienced back pain. She drinks water regularly and has not experienced increased frequency or urgency in urination. Bowel movements are regular, managed with prune juice for constipation.       Review of Systems  Genitourinary:  Positive for dysuria.   See HPI   Past Medical History:  Diagnosis Date   Allergic rhinitis    Allergy     SEASONAL   Asthma    Complication of anesthesia    Low BP   Eczema    Environmental allergies    Age 56 years   Family history of adverse reaction to anesthesia    Mother went into coma after anesthesia. 4 days to wake up   Fatty liver 12/11/2018   Noted on CT 6/20    Folliculitis    Generalized headaches    Migraines. 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 Rifampin since 08/2015   PVC (premature ventricular contraction) 11/09/2022   Streptococcal infection(041.00)    Age 50 and 28 years old   TB lung, latent    2017   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/2018   Diffuse enlargement on US  6/20    Social History   Socioeconomic History   Marital status: Married    Spouse name: Not on file   Number of children: 2   Years of education: Not on file   Highest education level: Associate degree: academic program  Occupational History   Occupation: dietary  Tobacco Use   Smoking status: Never   Smokeless tobacco: Never  Vaping Use   Vaping status: Never Used  Substance and Sexual Activity   Alcohol use: Not Currently   Drug use: No   Sexual activity: Yes    Partners: Female    Birth control/protection: None    Comment: female partner  Other Topics Concern   Not on file  Social History Narrative   Patient works at [employer], [employer]   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    Social Drivers of Corporate investment banker Strain: Low  Risk  (10/30/2023)   Overall Financial Resource Strain (CARDIA)    Difficulty of Paying Living Expenses: Not hard at all  Food Insecurity: No Food Insecurity (10/30/2023)   Hunger Vital Sign    Worried About Running Out of Food in the Last Year: Never true    Ran Out of Food in the Last Year: Never true  Transportation Needs: No Transportation Needs (10/30/2023)   PRAPARE - Administrator, Civil Service (Medical): No    Lack of Transportation (Non-Medical): No  Physical Activity: Sufficiently Active (10/30/2023)   Exercise Vital Sign    Days of Exercise per Week:  3 days    Minutes of Exercise per Session: 60 min  Stress: No Stress Concern Present (10/30/2023)   [employer] of Occupational Health - Occupational Stress Questionnaire    Feeling of Stress : Not at all  Social Connections: Unknown (10/30/2023)   Social Connection and Isolation Panel    Frequency of Communication with Friends and Family: Three times a week    Frequency of Social Gatherings with Friends and Family: Patient declined    Attends Religious Services: Patient declined    Active Member of Clubs or Organizations: No    Attends Engineer, structural: Not on file    Marital Status: Married  Catering manager Violence: Not on file    Past Surgical History:  Procedure Laterality Date   ESOPHAGOGASTRODUODENOSCOPY ENDOSCOPY  2021   HIP ARTHROSCOPY Left 03/20/2022   Procedure: LEFT HIP ARTHROSCOPY WITH LABRAL REPAIR;  Surgeon: Genelle Standing, MD;  Location: [facility] OR;  Service: Orthopedics;  Laterality: Left;   TONSILLECTOMY  06/18/2010   WISDOM TOOTH EXTRACTION      Family History  Problem Relation Age of Onset   Migraines Paternal Grandmother    Migraines Paternal Grandfather    HIV Father    Diabetes Mother    Hypertension Mother    Anemia Mother    COPD Mother    CVA Mother    Congestive Heart Failure Mother    Cirrhosis Mother    Obesity Mother    Irritable bowel syndrome Mother    Rheum arthritis Mother    Ulcers Brother    Anemia Sister    Cholecystitis Brother    Migraines Paternal Aunt    Migraines Maternal Uncle        Maternal Great Uncle   Diabetes Other        Family History   Hypertension Other        Family History   Depression Other        Family History   Asthma Other        Family History   Allergic rhinitis Other        Family History   Asthma Brother    Colon cancer Neg Hx    Colon polyps Neg Hx    Esophageal cancer Neg Hx    Rectal cancer Neg Hx    Stomach cancer Neg Hx     No Known Allergies  Current Outpatient  Medications on File Prior to Visit  Medication Sig Dispense Refill   mupirocin ointment (BACTROBAN) 2 % Apply topically 2 (two) times daily.     No current facility-administered medications on file prior to visit.    BP 120/80   Pulse 69   Temp 98.1 F (36.7 C) (Oral)   Ht 4' 10 (1.473 m)   Wt 131 lb (59.4 kg)   SpO2 96%   BMI 27.38  kg/m       Objective:   Physical Exam Vitals and nursing note reviewed.  Constitutional:      Appearance: Normal appearance.  Abdominal:     General: Abdomen is flat. Bowel sounds are normal. There is no distension.     Palpations: Abdomen is soft. There is no mass.     Tenderness: There is abdominal tenderness (LLQ which is common prior to her cycle).  Skin:    General: Skin is warm and dry.     Capillary Refill: Capillary refill takes less than 2 seconds.  Neurological:     General: No focal deficit present.     Mental Status: She is alert and oriented to person, place, and time.  Psychiatric:        Mood and Affect: Mood normal.        Behavior: Behavior normal.        Thought Content: Thought content normal.        Judgment: Judgment normal.       Assessment & Plan:  1. Dysuria (Primary)  - POC Urinalysis Dipstick- negative. Due to symptoms will send culture. Possible irritation of urethra - phenazopyridine  (PYRIDIUM ) 100 MG tablet; Take 1 tablet (100 mg total) by mouth 3 (three) times daily as needed for pain.  Dispense: 10 tablet; Refill: 0 - Culture, Urine; Future  Darleene Shape, NP

## 2024-02-24 ENCOUNTER — Ambulatory Visit: Admitting: Family Medicine

## 2024-02-24 ENCOUNTER — Encounter: Payer: Self-pay | Admitting: Family Medicine

## 2024-02-24 VITALS — BP 100/68 | HR 79 | Temp 98.1°F | Resp 12 | Ht <= 58 in | Wt 132.0 lb

## 2024-02-24 DIAGNOSIS — R233 Spontaneous ecchymoses: Secondary | ICD-10-CM | POA: Diagnosis not present

## 2024-02-24 DIAGNOSIS — R1310 Dysphagia, unspecified: Secondary | ICD-10-CM

## 2024-02-24 DIAGNOSIS — Z23 Encounter for immunization: Secondary | ICD-10-CM

## 2024-02-24 DIAGNOSIS — K219 Gastro-esophageal reflux disease without esophagitis: Secondary | ICD-10-CM

## 2024-02-24 MED ORDER — OMEPRAZOLE 2 MG/ML ORAL SUSPENSION: 40.0000 mg | Freq: Every day | ORAL | 2 refills | Status: AC

## 2024-02-24 NOTE — Progress Notes (Addendum)
 ACUTE VISIT Chief Complaint  Patient presents with   Dysphagia    Pt c/o difficulty swallowing food in the past months. Pt reports she has history with trouble swallowing pills/food about 2 years ago. Was getting better. Now reoccurs. Pt added sometimes experience chest pain when swallow.    Discussed the use of AI scribe software for clinical note transcription with the patient, who gave verbal consent to proceed.  History of Present Illness Maria Smith is a 28 year old female with past medical history significant for PACs/PVCs, seasonal allergies/asthma, GERD, migraine headaches, and vitamin D  deficiency who presents with difficulty swallowing foods as described above.  She has experienced difficulty swallowing foods for a couple of years, which has worsened over the past month. She describes a sensation of food getting stuck in her throat or at the base of her throat. She has been unable to swallow pills for years, resorting to crushing them or taking liquid forms. Recently, her voice has become hoarse, and she experiences tightness in her lower mid chest, especially when eating solid foods.  She underwent a barium swallow study years ago but did not receive clear information about the results.  She has undergone an upper endoscopy in March 2021, reported gastritis,biopsies were taken.Esophagus was normal She has seen ENT and underwent laryngoscopy in August 2021 and it was negative for infection or neoplasia.  No heartburn or burping, except when consuming very spicy foods, which she generally avoids. She has taken omeprazole  in the past but had difficulty swallowing the pills, opting to open the capsules instead.  She has a history of seeing a rheumatologist due to bruising and left hip pain with positive ANA. She still experiences random bruising, particularly on her thighs, and has heavy menstrual periods.  Hip pain has resolved, denies arthralgias or significant myalgias.   Rheumatologic workup was otherwise negative.  Lab Results  Component Value Date   WBC 6.5 12/23/2023   HGB 12.2 12/23/2023   HCT 36.4 12/23/2023   MCV 84.8 12/23/2023   PLT 298 12/23/2023   Review of Systems  Constitutional:  Negative for activity change and appetite change.  HENT:  Negative for mouth sores.   Respiratory:  Negative for cough, shortness of breath and wheezing.   Cardiovascular:  Negative for chest pain and leg swelling.  Gastrointestinal:  Negative for abdominal pain, anal bleeding, blood in stool, nausea and vomiting.  Endocrine: Negative for cold intolerance and heat intolerance.  Genitourinary:  Negative for decreased urine volume, dysuria and hematuria.  Skin:  Negative for rash and wound.  Neurological:  Negative for syncope, facial asymmetry and weakness.  Psychiatric/Behavioral:  Negative for confusion and hallucinations.   See other pertinent positives and negatives in HPI.  Current Outpatient Medications on File Prior to Visit  Medication Sig Dispense Refill   mupirocin ointment (BACTROBAN) 2 % Apply topically 2 (two) times daily.     phenazopyridine  (PYRIDIUM ) 100 MG tablet Take 1 tablet (100 mg total) by mouth 3 (three) times daily as needed for pain. (Patient not taking: Reported on 02/24/2024) 10 tablet 0   No current facility-administered medications on file prior to visit.    Past Medical History:  Diagnosis Date   Allergic rhinitis    Allergy     SEASONAL   Asthma    Complication of anesthesia    Low BP   Eczema    Environmental allergies    Age 61 years   Family history of adverse reaction to anesthesia  Mother went into coma after anesthesia. 4 days to wake up   Fatty liver 12/11/2018   Noted on CT 6/20   Folliculitis    Generalized headaches    Migraines. 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 Rifampin since 08/2015   PVC (premature ventricular contraction) 11/09/2022   Streptococcal infection(041.00)    Age 3 and 28 years old   TB lung, latent    2017   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/2018   Diffuse enlargement on US  6/20   No Known Allergies  Social History   Socioeconomic History   Marital status: Married    Spouse name: Not on file   Number of children: 2   Years of education: Not on file   Highest education level: Associate degree: academic program  Occupational History   Occupation: dietary  Tobacco Use   Smoking status: Never   Smokeless tobacco: Never  Vaping Use   Vaping status: Never Used  Substance and Sexual Activity   Alcohol use: Not Currently   Drug use: No   Sexual activity: Yes    Partners: Female    Birth control/protection: None    Comment: female partner  Other Topics Concern   Not on file  Social History Narrative   Patient works at [employer], [employer]   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    Social Drivers of Corporate investment banker Strain: Low Risk  (10/30/2023)   Overall Financial Resource Strain (CARDIA)    Difficulty of Paying Living Expenses: Not hard at all  Food Insecurity: No Food Insecurity (10/30/2023)   Hunger Vital Sign    Worried About Running Out of Food in the Last Year: Never true    Ran Out of Food in the Last Year: Never true  Transportation Needs: No Transportation Needs (10/30/2023)   PRAPARE - Administrator, Civil Service (Medical): No    Lack of Transportation (Non-Medical): No  Physical Activity: Sufficiently Active (10/30/2023)   Exercise Vital Sign    Days of Exercise per Week: 3 days    Minutes of Exercise per Session: 60 min  Stress: No Stress Concern Present (10/30/2023)   [employer] of Occupational Health - Occupational Stress Questionnaire    Feeling of Stress : Not at  all  Social Connections: Unknown (10/30/2023)   Social Connection and Isolation Panel    Frequency of Communication with Friends and Family: Three times a week    Frequency of Social Gatherings with Friends and Family: Patient declined    Attends Religious Services: Patient declined    Active Member of Clubs or Organizations: No    Attends Engineer, structural: Not on file    Marital Status: Married    Vitals:   02/24/24 0929  BP: 100/68  Pulse: 79  Resp: 12  Temp: 98.1 F (36.7 C)  SpO2: 98%   Body mass index is 27.59 kg/m.  Physical Exam Vitals and nursing note reviewed.  Constitutional:      General: She is not in acute distress.    Appearance: She is well-developed.  HENT:     Head: Normocephalic and atraumatic.     Mouth/Throat:  Mouth: Mucous membranes are moist.     Pharynx: Oropharynx is clear.  Eyes:     Conjunctiva/sclera: Conjunctivae normal.  Neck:     Thyroid : No thyroid  mass or thyromegaly.  Cardiovascular:     Rate and Rhythm: Normal rate and regular rhythm.     Heart sounds: No murmur heard. Pulmonary:     Effort: Pulmonary effort is normal. No respiratory distress.     Breath sounds: Normal breath sounds.  Abdominal:     Palpations: Abdomen is soft. There is no hepatomegaly or mass.     Tenderness: There is no abdominal tenderness.  Musculoskeletal:     Cervical back: No edema or erythema.     Right lower leg: No edema.     Left lower leg: No edema.  Lymphadenopathy:     Cervical: No cervical adenopathy.  Skin:    General: Skin is warm.     Findings: No erythema or rash.  Neurological:     General: No focal deficit present.     Mental Status: She is alert and oriented to person, place, and time.     Cranial Nerves: No cranial nerve deficit.     Gait: Gait normal.  Psychiatric:        Mood and Affect: Mood and affect normal.   ASSESSMENT AND PLAN:  Maria Smith was seen today for concerns about difficulty  swallowing.  Dysphagia, unspecified type We discussed possible etiologies, chronic and has had otherwise negative workup. For now she agrees with holding on further workup. Could be related with GERD. PPI started today. Instructed about warning signs. If problem is persisting, we will recommend GI evaluation.  Gastroesophageal reflux disease, unspecified whether esophagitis present Assessment & Plan: This problem could be contributing to dysphagia. She has had today laryngoscopy and EGD in the past, the latter one showed gastritis. Recommend omeprazole  40 mg (suspension) daily before breakfast. Continue GERD precautions. Follow-up in 2 months.  Orders: -     omeprazole ; Take 20 mLs (40 mg total) by mouth daily before breakfast.  Dispense: 600 mL; Refill: 2  Easy bruising  We discussed possible etiologies, including coagulation disorder like von Willebrand (also heavy menses).  History and examination today do not suggest a serious process. She prefers to hold on blood work for now.  Influenza vaccine needed -     Flu vaccine trivalent PF, 6mos and older(Flulaval ,Afluria,Fluarix,Fluzone)  Return in about 2 months (around 04/25/2024).  Maria Smith G. [place], MD  [facility]. [place] office.

## 2024-02-24 NOTE — Assessment & Plan Note (Signed)
 This problem could be contributing to dysphagia. She has had today laryngoscopy and EGD in the past, the latter one showed gastritis. Recommend omeprazole  40 mg (suspension) daily before breakfast. Continue GERD precautions. Follow-up in 2 months.

## 2024-02-24 NOTE — Patient Instructions (Addendum)
 A few things to remember from today's visit:  Dysphagia, unspecified type  Gastroesophageal reflux disease, unspecified whether esophagitis present - Plan: omeprazole  (KONVOMEP) 2 mg/mL SUSP oral suspension Omeprazole  liquid started today.  If you need refills for medications you take chronically, please call your pharmacy. Do not use My Chart to request refills or for acute issues that need immediate attention. If you send a my chart message, it may take a few days to be addressed, specially if I am not in the office.  Please be sure medication list is accurate. If a new problem present, please set up appointment sooner than planned today.

## 2024-04-20 ENCOUNTER — Telehealth: Payer: Self-pay | Admitting: Family Medicine

## 2024-04-20 ENCOUNTER — Encounter: Payer: Self-pay | Admitting: Radiology

## 2024-04-20 NOTE — Telephone Encounter (Unsigned)
 Copied from CRM #[id number]. Topic: Clinical - Medication Refill >> Apr 20, 2024  1:30 PM Paige D wrote: Medication: albuteral inhalier   Has the patient contacted their pharmacy? No (Agent: If no, request that the patient contact the pharmacy for the refill. If patient does not wish to contact the pharmacy document the reason why and proceed with request.) (Agent: If yes, when and what did the pharmacy advise?)  This is the patient's preferred pharmacy:   [place] [facility] [address] [place] [address] Phone: [phone] Fax: [phone]  Is this the correct pharmacy for this prescription? Yes If no, delete pharmacy and type the correct one.   Has the prescription been filled recently? No  Is the patient out of the medication? Yes  Has the patient been seen for an appointment in the last year OR does the patient have an upcoming appointment? Yes  Can we respond through MyChart? Yes  Agent: Please be advised that Rx refills may take up to 3 business days. We ask that you follow-up with your pharmacy.

## 2024-04-20 NOTE — Telephone Encounter (Signed)
 albuteral inhalier not on current list.

## 2024-04-22 ENCOUNTER — Other Ambulatory Visit (HOSPITAL_COMMUNITY): Payer: Self-pay

## 2024-04-22 ENCOUNTER — Other Ambulatory Visit: Payer: Self-pay | Admitting: Family Medicine

## 2024-04-22 DIAGNOSIS — J4520 Mild intermittent asthma, uncomplicated: Secondary | ICD-10-CM

## 2024-04-22 MED ORDER — ALBUTEROL SULFATE HFA 108 (90 BASE) MCG/ACT IN AERS: 2.0000 | INHALATION_SPRAY | Freq: Four times a day (QID) | RESPIRATORY_TRACT | 1 refills | Status: DC | PRN

## 2024-04-22 MED ORDER — ALBUTEROL SULFATE HFA 108 (90 BASE) MCG/ACT IN AERS
2.0000 | INHALATION_SPRAY | Freq: Four times a day (QID) | RESPIRATORY_TRACT | 1 refills | Status: AC | PRN
  Filled 2024-04-22: qty 6.7, 25d supply, fill #0

## 2024-04-22 NOTE — Telephone Encounter (Signed)
 Patient notified of prescription. Advised patient of directions of use. Patient voiced understanding.

## 2024-04-22 NOTE — Telephone Encounter (Signed)
 History of asthma. Prescription for albuterol  inhaler sent to her pharmacy as requested to use 1 to 2 puff every 6 hours as needed. Thanks, BJ

## 2024-05-18 ENCOUNTER — Ambulatory Visit: Payer: Self-pay

## 2024-05-18 NOTE — Telephone Encounter (Signed)
 FYI Only or Action Required?: Action required by provider: referral request. Pt has scheduled an appt for tomorrow. Referral to neuro has expired. Pt would like another referral to neuro  Patient was last seen in primary care on 02/24/2024 by Jordan, Betty G, MD.  Called Nurse Triage reporting Headache. - comes and goes front of head and temples.  Symptoms began several days ago.  Interventions attempted: OTC medications: Excedrin. OTC med works for [employer] then returns.   Symptoms are: unchanged.  Triage Disposition: See Physician Within 24 Hours  Patient/caregiver understands and will follow disposition?: Yes                     Copied from CRM #[id number]. Topic: Clinical - Red Word Triage >> May 18, 2024  9:52 AM Alfonso ORN wrote: Red Word that prompted transfer to Nurse Triage: pain in front of head, headache past couple days  , has been taking excedrin not getting better Reason for Disposition  [1] MODERATE headache (e.g., interferes with normal activities) AND [2] present > 24 hours AND [3] unexplained  (Exceptions: Pain medicines not tried, typical migraine, or headache part of viral illness.)  Protocols used: Headache-A-AH

## 2024-05-19 ENCOUNTER — Encounter: Payer: Self-pay | Admitting: Family Medicine

## 2024-05-19 ENCOUNTER — Ambulatory Visit: Admitting: Family Medicine

## 2024-05-19 VITALS — BP 126/70 | HR 80 | Temp 97.6°F | Resp 16 | Ht <= 58 in | Wt 125.8 lb

## 2024-05-19 DIAGNOSIS — R519 Headache, unspecified: Secondary | ICD-10-CM

## 2024-05-19 MED ORDER — KETOROLAC TROMETHAMINE 60 MG/2ML IM SOLN
60.0000 mg | Freq: Once | INTRAMUSCULAR | Status: AC
  Administered 2024-05-19: 60 mg via INTRAMUSCULAR

## 2024-05-19 MED ORDER — CYCLOBENZAPRINE HCL 10 MG PO TABS: 5.0000 mg | ORAL_TABLET | Freq: Every day | ORAL | 0 refills | Status: AC

## 2024-05-19 MED ORDER — NAPROXEN 500 MG PO TABS: 500.0000 mg | ORAL_TABLET | Freq: Two times a day (BID) | ORAL | 0 refills | Status: AC

## 2024-05-19 NOTE — Patient Instructions (Addendum)
 A few things to remember from today's visit:  Headache, unspecified headache type - Plan: ketorolac  (TORADOL ) injection 60 mg, cyclobenzaprine  (FLEXERIL ) 10 MG tablet, naproxen  (NAPROSYN ) 500 MG tablet Tension headache vs migraine headache. Today here in the office you received Toradol  60 mg IM.  Continue Naproxen  500 mg twice daily for 3-5 days.  Do not restart this medication until 8 hours after injection and take it with food. Before trying amitriptyline , lets try a muscle relaxant at bedtime for 2 weeks, Flexeril  10 mg at bedtime. Start a headache diary.  If you need refills for medications you take chronically, please call your pharmacy. Do not use My Chart to request refills or for acute issues that need immediate attention. If you send a my chart message, it may take a few days to be addressed, specially if I am not in the office.  Please be sure medication list is accurate. If a new problem present, please set up appointment sooner than planned today.

## 2024-05-19 NOTE — Progress Notes (Signed)
 ACUTE VISIT Chief Complaint  Patient presents with   Headache   Discussed the use of AI scribe software for clinical note transcription with the patient, who gave verbal consent to proceed.  History of Present Illness Maria Smith is a 28 year old female  with past medical history significant for migraine, allergic rhinitis, asthma, GERD, and insomnia here today complaining of headache.  She has been experiencing a headache for the past five days, similar to her previous migraines. The pain is bilateral, located in the frontal region and around the temples, and is described as dull and achy with pressure when it intensifies. The intensity can reach a 10 out of 10 on the pain scale. She experiences light, heat, and sound sensitivity but has no nausea or vomiting. No associated focal weakness, MS changes, numbness/tingling, or visual changes.  She has a history of migraines, with the last episode occurring a year ago. Typically, her migraines were right occipital/parietal, but this headache is frontal. She has not identified any specific triggers such as diet, stress, or weather changes, although she mentions engaging in moderate exercise, specifically walking on a treadmill.  Her sleep has been affected by the headache, as she finds it difficult to relax and fall asleep, despite taking magnesium  to aid sleep. She usually sleeps six to seven hours per night.  She had a brain MRI in December 2024 due to headaches and visual disturbances, which was normal. She mentions that she is supposed to wear glasses but does not like to wear them.  No nasal congestion, runny nose, or sore throat.  Lab Results  Component Value Date   NA 135 12/23/2023   CL 103 12/23/2023   K 4.3 12/23/2023   CO2 24 12/23/2023   BUN 12 12/23/2023   CREATININE 1.00 12/23/2023   GFRNONAA >60 12/23/2023   CALCIUM 9.5 12/23/2023   ALBUMIN 4.5 10/16/2023   GLUCOSE 89 12/23/2023   Lab Results  Component Value Date    WBC 6.5 12/23/2023   HGB 12.2 12/23/2023   HCT 36.4 12/23/2023   MCV 84.8 12/23/2023   PLT 298 12/23/2023   Review of Systems  Constitutional:  Negative for appetite change, chills and fever.  HENT:  Negative for mouth sores and sore throat.   Eyes:  Negative for discharge and redness.  Respiratory:  Negative for cough and shortness of breath.   Cardiovascular:  Negative for chest pain and leg swelling.  Gastrointestinal:  Negative for abdominal pain.  Endocrine: Negative for cold intolerance and heat intolerance.  Skin:  Negative for rash.  Allergic/Immunologic: Positive for environmental allergies.  Neurological:  Negative for seizures, syncope and facial asymmetry.  Psychiatric/Behavioral:  Negative for confusion and hallucinations.   See other pertinent positives and negatives in HPI.  Current Outpatient Medications on File Prior to Visit  Medication Sig Dispense Refill   albuterol  (VENTOLIN  HFA) 108 (90 Base) MCG/ACT inhaler Inhale 2 puffs into the lungs every 6 (six) hours as needed for wheezing or shortness of breath. 8 g 1   mupirocin ointment (BACTROBAN) 2 % Apply topically 2 (two) times daily.     omeprazole  (KONVOMEP) 2 mg/mL SUSP oral suspension Take 20 mLs (40 mg total) by mouth daily before breakfast. 600 mL 2   No current facility-administered medications on file prior to visit.   Past Medical History:  Diagnosis Date   Allergic rhinitis    Allergy     SEASONAL   Asthma    Complication of anesthesia  Low BP   Eczema    Environmental allergies    Age 28 years   Family history of adverse reaction to anesthesia    Mother went into coma after anesthesia. 4 days to wake up   Fatty liver 12/11/2018   Noted on CT 6/20   Folliculitis    Generalized headaches    Migraines. Began at age 42 years   GERD (gastroesophageal reflux disease)    Influenza    Age 19 years   Mitral valve regurgitation    PAC (premature atrial contraction) 11/09/2022   Positive PPD  07/2015   Taking Rifampin since 08/2015   PVC (premature ventricular contraction) 11/09/2022   Streptococcal infection(041.00)    Age 27 and 28 years old   TB lung, latent    2017   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/2018   Diffuse enlargement on US  6/20   No Known Allergies  Social History   Socioeconomic History   Marital status: Married    Spouse name: Not on file   Number of children: 2   Years of education: Not on file   Highest education level: Associate degree: academic program  Occupational History   Occupation: dietary  Tobacco Use   Smoking status: Never   Smokeless tobacco: Never  Vaping Use   Vaping status: Never Used  Substance and Sexual Activity   Alcohol use: Not Currently   Drug use: No   Sexual activity: Yes    Partners: Female    Birth control/protection: None    Comment: female partner  Other Topics Concern   Not on file  Social History Narrative   Patient works at [employer], [employer]   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    Social Drivers of Corporate Investment Banker Strain: Low Risk  (10/30/2023)   Overall Financial Resource Strain (CARDIA)    Difficulty of Paying Living Expenses: Not hard at all  Food Insecurity: No Food Insecurity (10/30/2023)   Hunger Vital Sign    Worried About Running Out of Food in the Last Year: Never true    Ran Out of Food in the Last Year: Never true  Transportation Needs: No Transportation Needs (10/30/2023)   PRAPARE - Administrator, Civil Service (Medical): No    Lack of Transportation (Non-Medical): No  Physical Activity: Sufficiently Active (10/30/2023)   Exercise Vital Sign    Days of Exercise per Week: 3 days    Minutes of Exercise per Session: 60 min  Stress: No Stress Concern Present (10/30/2023)   [employer] of Occupational Health - Occupational Stress Questionnaire    Feeling of Stress : Not at all  Social  Connections: Unknown (10/30/2023)   Social Connection and Isolation Panel    Frequency of Communication with Friends and Family: Three times a week    Frequency of Social Gatherings with Friends and Family: Patient declined    Attends Religious Services: Patient declined    Active Member of Clubs or Organizations: No    Attends Engineer, Structural: Not on file    Marital Status: Married    Vitals:   05/19/24 0854  BP: 126/70  Pulse: 80  Resp: 16  Temp: 97.6 F (36.4 C)  SpO2: 99%   Body mass index is 26.29 kg/m.  Physical Exam Vitals and nursing note reviewed.  Constitutional:      General:  She is not in acute distress.    Appearance: She is well-developed.  HENT:     Head: Normocephalic and atraumatic.     Nose:     Right Sinus: No maxillary sinus tenderness or frontal sinus tenderness.     Left Sinus: No maxillary sinus tenderness or frontal sinus tenderness.     Mouth/Throat:     Mouth: Mucous membranes are moist.     Pharynx: Oropharynx is clear.  Eyes:     Extraocular Movements: Extraocular movements intact.     Conjunctiva/sclera: Conjunctivae normal.     Pupils: Pupils are equal, round, and reactive to light.     Funduscopic exam:    Right eye: No hemorrhage or papilledema.        Left eye: No hemorrhage or papilledema.  Cardiovascular:     Rate and Rhythm: Normal rate and regular rhythm.     Heart sounds: No murmur heard. Pulmonary:     Effort: Pulmonary effort is normal. No respiratory distress.     Breath sounds: Normal breath sounds.  Abdominal:     Palpations: Abdomen is soft. There is no hepatomegaly or mass.     Tenderness: There is no abdominal tenderness.  Musculoskeletal:     Cervical back: Neck supple. No rigidity. No pain with movement or muscular tenderness. Normal range of motion.  Lymphadenopathy:     Cervical: No cervical adenopathy.  Skin:    General: Skin is warm.     Findings: No erythema or rash.  Neurological:      General: No focal deficit present.     Mental Status: She is alert and oriented to person, place, and time.     Cranial Nerves: No cranial nerve deficit.     Motor: No tremor or pronator drift.     Coordination: Coordination normal.     Gait: Gait normal.     Deep Tendon Reflexes:     Reflex Scores:      Patellar reflexes are 2+ on the right side and 2+ on the left side. Psychiatric:        Mood and Affect: Mood and affect normal.    ASSESSMENT AND PLAN:  Zetta was seen today for acute headache.  Headache, unspecified headache type -     Ketorolac  Tromethamine  -     Cyclobenzaprine  HCl; Take 0.5-1 tablets (5-10 mg total) by mouth at bedtime for 15 days.  Dispense: 15 tablet; Refill: 0 -     Naproxen ; Take 1 tablet (500 mg total) by mouth 2 (two) times daily with a meal for 5 days.  Dispense: 10 tablet; Refill: 0   Achy/dull bilateral frontal and temporal headache, no associated nausea vomiting. History and examination today do not suggest a serious process, I do not think imaging is needed at this time. We discussed possible etiologies, tension headache versus migraine. Instructed to arrange an eye exam. Here in the office after verbal consent, she received Toradol  60 mg IM, headache improved, now 7/10. Recommend naproxen  500 mg twice daily for 3 to 5 days, do not take it until 8 hours after Toradol  given today. Flexeril  5 to 10 mg daily at bedtime for 2 weeks. Instructed to let me know if still having headaches, in which case we will try amitriptyline  10 mg at bedtime. She was clearly instructed about warning signs. Follow-up in 6 weeks with a headache diary.  I personally spent a total of 30 minutes in the care of the patient today including preparing to  see the patient, getting/reviewing separately obtained history, performing a medically appropriate exam/evaluation, counseling and educating, and documenting clinical information in the EHR.  Return in about 6 weeks (around  06/30/2024) for Headaches.  Agata Lucente G. Jodie Cavey, MD  [facility]. [place] office.

## 2024-06-15 ENCOUNTER — Other Ambulatory Visit: Payer: Self-pay

## 2024-06-15 ENCOUNTER — Emergency Department (HOSPITAL_BASED_OUTPATIENT_CLINIC_OR_DEPARTMENT_OTHER): Admitting: Radiology

## 2024-06-15 ENCOUNTER — Encounter (HOSPITAL_BASED_OUTPATIENT_CLINIC_OR_DEPARTMENT_OTHER): Payer: Self-pay | Admitting: Emergency Medicine

## 2024-06-15 DIAGNOSIS — R002 Palpitations: Secondary | ICD-10-CM | POA: Insufficient documentation

## 2024-06-15 DIAGNOSIS — Z5321 Procedure and treatment not carried out due to patient leaving prior to being seen by health care provider: Secondary | ICD-10-CM | POA: Diagnosis not present

## 2024-06-15 DIAGNOSIS — R202 Paresthesia of skin: Secondary | ICD-10-CM | POA: Diagnosis not present

## 2024-06-15 DIAGNOSIS — R0789 Other chest pain: Secondary | ICD-10-CM | POA: Diagnosis present

## 2024-06-15 DIAGNOSIS — R519 Headache, unspecified: Secondary | ICD-10-CM | POA: Diagnosis not present

## 2024-06-15 LAB — BASIC METABOLIC PANEL WITH GFR
Anion gap: 10 (ref 5–15)
BUN: 9 mg/dL (ref 6–20)
CO2: 25 mmol/L (ref 22–32)
Calcium: 9.6 mg/dL (ref 8.9–10.3)
Chloride: 103 mmol/L (ref 98–111)
Creatinine, Ser: 0.87 mg/dL (ref 0.44–1.00)
GFR, Estimated: >60 mL/min
Glucose, Bld: 93 mg/dL (ref 70–99)
Potassium: 3.6 mmol/L (ref 3.5–5.1)
Sodium: 138 mmol/L (ref 135–145)

## 2024-06-15 LAB — CBC
HCT: 38.1 % (ref 36.0–46.0)
Hemoglobin: 12.6 g/dL (ref 12.0–15.0)
MCH: 28.6 pg (ref 26.0–34.0)
MCHC: 33.1 g/dL (ref 30.0–36.0)
MCV: 86.4 fL (ref 80.0–100.0)
Platelets: 368 K/uL (ref 150–400)
RBC: 4.41 MIL/uL (ref 3.87–5.11)
RDW: 12.7 % (ref 11.5–15.5)
WBC: 7.3 K/uL (ref 4.0–10.5)
nRBC: 0 % (ref 0.0–0.2)

## 2024-06-15 LAB — TROPONIN T, HIGH SENSITIVITY: Troponin T High Sensitivity: <15 ng/L (ref 0–19)

## 2024-06-15 NOTE — ED Triage Notes (Signed)
 Patient c/o central chest pain, headache, palpitations, and tingling in left arm that started yesterday, worsening today.  Patient denies cough, fever, congestion and sob.

## 2024-06-16 ENCOUNTER — Emergency Department (HOSPITAL_BASED_OUTPATIENT_CLINIC_OR_DEPARTMENT_OTHER)
Admission: EM | Admit: 2024-06-16 | Discharge: 2024-06-16 | Discharge status: Against Medical Advice | Attending: Emergency Medicine | Admitting: Emergency Medicine

## 2024-06-16 LAB — PREGNANCY, URINE: Preg Test, Ur: NEGATIVE

## 2024-06-16 NOTE — ED Notes (Signed)
No answer when called for treatment room.  ?

## 2024-06-23 ENCOUNTER — Encounter: Payer: Self-pay | Admitting: Family Medicine
